# Patient Record
Sex: Male | Born: 1955 | Race: White | Hispanic: No | Marital: Married | Smoking: Current every day smoker
Health system: Southern US, Community
[De-identification: ages and names within clinical notes are randomized; demographics above are authoritative.]

## PROBLEM LIST (undated history)

## (undated) DIAGNOSIS — D72829 Elevated white blood cell count, unspecified: Secondary | ICD-10-CM

## (undated) DIAGNOSIS — U071 COVID-19: Secondary | ICD-10-CM

## (undated) DIAGNOSIS — Z9981 Dependence on supplemental oxygen: Secondary | ICD-10-CM

## (undated) DIAGNOSIS — E785 Hyperlipidemia, unspecified: Secondary | ICD-10-CM

## (undated) DIAGNOSIS — I10 Essential (primary) hypertension: Secondary | ICD-10-CM

## (undated) DIAGNOSIS — I219 Acute myocardial infarction, unspecified: Secondary | ICD-10-CM

## (undated) DIAGNOSIS — J449 Chronic obstructive pulmonary disease, unspecified: Secondary | ICD-10-CM

## (undated) DIAGNOSIS — J189 Pneumonia, unspecified organism: Secondary | ICD-10-CM

## (undated) DIAGNOSIS — R0600 Dyspnea, unspecified: Secondary | ICD-10-CM

## (undated) DIAGNOSIS — M069 Rheumatoid arthritis, unspecified: Secondary | ICD-10-CM

## (undated) HISTORY — DX: Acute myocardial infarction, unspecified: I21.9

## (undated) HISTORY — PX: TOOTH EXTRACTION: SUR596

## (undated) HISTORY — PX: FOOT SURGERY: SHX648

## (undated) HISTORY — PX: MULTIPLE TOOTH EXTRACTIONS: SHX2053

## (undated) HISTORY — PX: HERNIA REPAIR: SHX51

## (undated) HISTORY — PX: SPLENECTOMY: SUR1306

---

## 1997-12-14 ENCOUNTER — Ambulatory Visit (HOSPITAL_COMMUNITY): Admission: RE | Admit: 1997-12-14 | Discharge: 1997-12-14 | Payer: Self-pay

## 2000-08-15 ENCOUNTER — Encounter (HOSPITAL_COMMUNITY): Admission: RE | Admit: 2000-08-15 | Discharge: 2000-10-03 | Payer: Self-pay | Admitting: Dentistry

## 2000-08-16 ENCOUNTER — Encounter (HOSPITAL_COMMUNITY): Payer: Self-pay | Admitting: Dentistry

## 2000-08-22 ENCOUNTER — Encounter (HOSPITAL_COMMUNITY): Payer: Self-pay | Admitting: Dentistry

## 2000-08-23 ENCOUNTER — Ambulatory Visit (HOSPITAL_COMMUNITY): Admission: RE | Admit: 2000-08-23 | Discharge: 2000-08-23 | Payer: Self-pay | Admitting: Dentistry

## 2000-09-07 ENCOUNTER — Encounter: Payer: Self-pay | Admitting: Hematology & Oncology

## 2000-09-07 ENCOUNTER — Ambulatory Visit (HOSPITAL_COMMUNITY): Admission: RE | Admit: 2000-09-07 | Discharge: 2000-09-07 | Payer: Self-pay | Admitting: Hematology & Oncology

## 2000-09-07 ENCOUNTER — Inpatient Hospital Stay (HOSPITAL_COMMUNITY): Admission: EM | Admit: 2000-09-07 | Discharge: 2000-09-12 | Payer: Self-pay | Admitting: Emergency Medicine

## 2000-09-09 ENCOUNTER — Encounter: Payer: Self-pay | Admitting: Oncology

## 2000-09-10 ENCOUNTER — Encounter (HOSPITAL_COMMUNITY): Payer: Self-pay | Admitting: Oncology

## 2000-10-04 ENCOUNTER — Encounter (HOSPITAL_COMMUNITY): Admission: RE | Admit: 2000-10-04 | Discharge: 2001-01-02 | Payer: Self-pay | Admitting: Dentistry

## 2000-10-25 ENCOUNTER — Ambulatory Visit (HOSPITAL_COMMUNITY): Admission: RE | Admit: 2000-10-25 | Discharge: 2000-10-25 | Payer: Self-pay | Admitting: Oncology

## 2000-10-25 ENCOUNTER — Encounter: Payer: Self-pay | Admitting: Oncology

## 2000-12-23 ENCOUNTER — Emergency Department (HOSPITAL_COMMUNITY): Admission: EM | Admit: 2000-12-23 | Discharge: 2000-12-23 | Payer: Self-pay

## 2000-12-23 ENCOUNTER — Encounter: Payer: Self-pay | Admitting: Emergency Medicine

## 2001-10-30 ENCOUNTER — Encounter: Admission: RE | Admit: 2001-10-30 | Discharge: 2001-10-30 | Payer: Self-pay | Admitting: Infectious Diseases

## 2003-05-21 ENCOUNTER — Inpatient Hospital Stay (HOSPITAL_COMMUNITY): Admission: EM | Admit: 2003-05-21 | Discharge: 2003-05-29 | Payer: Self-pay | Admitting: Oncology

## 2003-05-26 ENCOUNTER — Encounter (INDEPENDENT_AMBULATORY_CARE_PROVIDER_SITE_OTHER): Payer: Self-pay | Admitting: *Deleted

## 2003-05-28 IMAGING — US US ABDOMEN COMPLETE
1 series · 14 of 25 positions shown · non-contrast
Comparison: none

CLINICAL DATA: Rheumatoid arthritis and Felty?s syndrome.  Left-sided abdominal pain.  
 ABDOMINAL ULTRASOUND 
 There is no evidence of gallstones or gallbladder wall thickening.  There is no evidence of biliary ductal dilation with common bile duct measuring approximately 5 mm.  The liver is normal in echogenicity.  No focal liver lesions are seen.  Visualized portion of the IVC is unremarkable.  Visualization of the pancreas was very limited due to overlying bowel gas.  Splenomegaly is noted with the spleen measuring approximately 20 cm in length.  No splenic lesions are identified.  Kidneys are normal in size and appearance.  There is no evidence of hydronephrosis.  Evaluation of the abdominal aorta was also limited by overlying bowel gas.  The visualized portion of the distal aorta is nondilated.  
 IMPRESSION
 1.  Splenomegaly, with splenic length measuring approximately 20 cm. 
 2.  No evidence of gallstones or biliary ductal dilation.

[Series 1: unknown · 0.37mm/px · 14 of 75 slices shown]
[im 1/75]
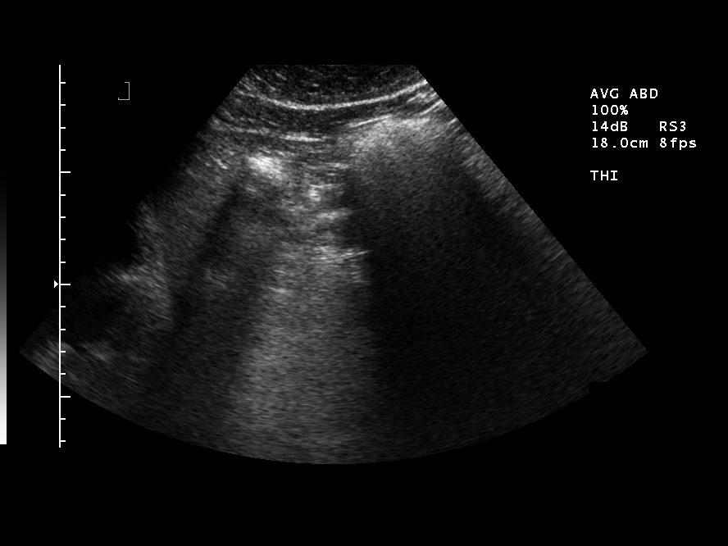
[im 7/75]
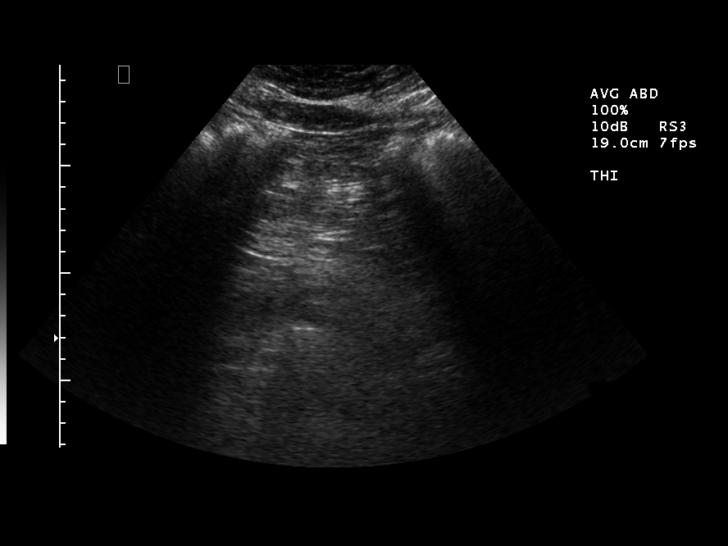
[im 13/75]
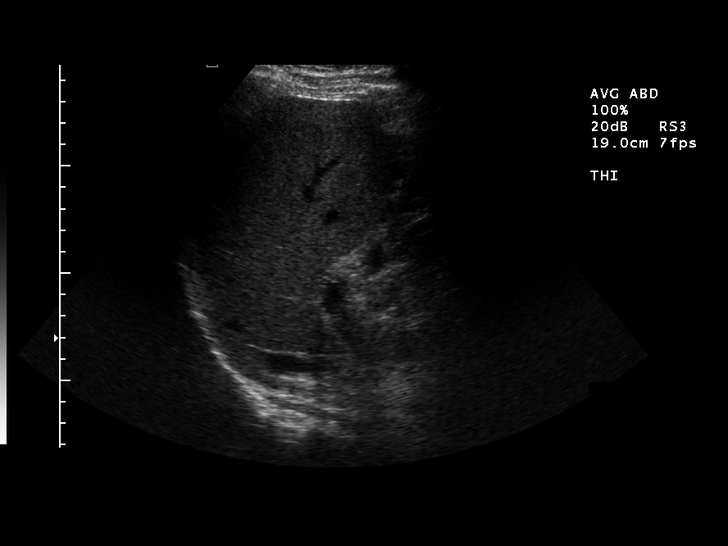
[im 19/75]
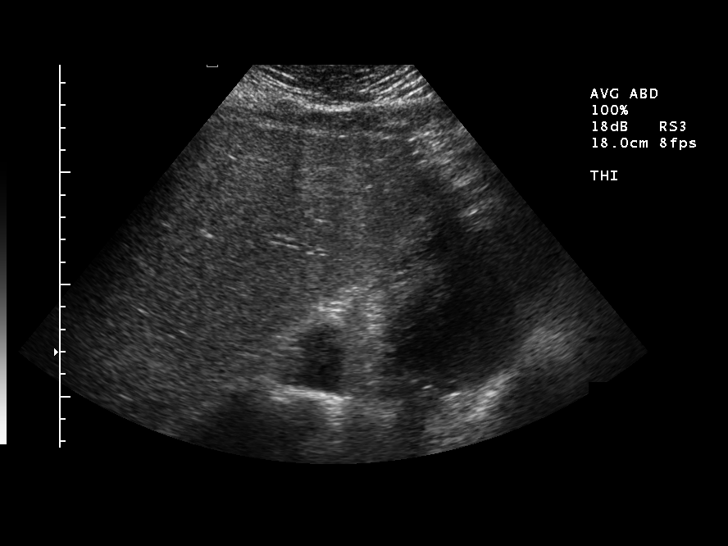
[im 25/75]
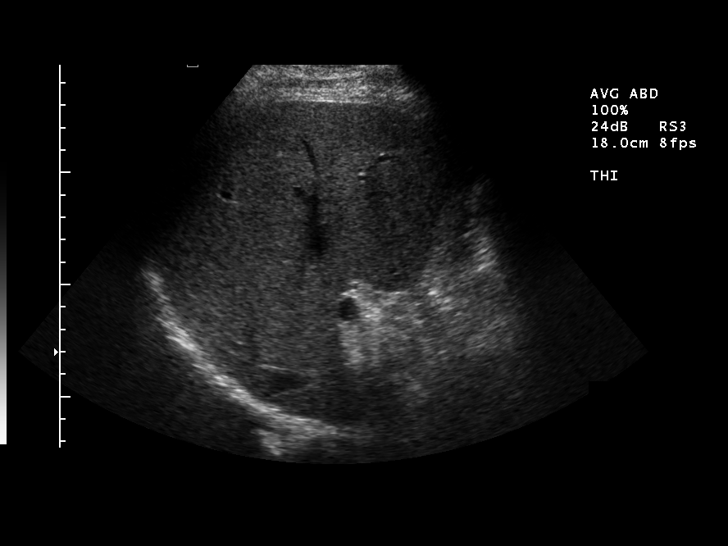
[im 28/75]
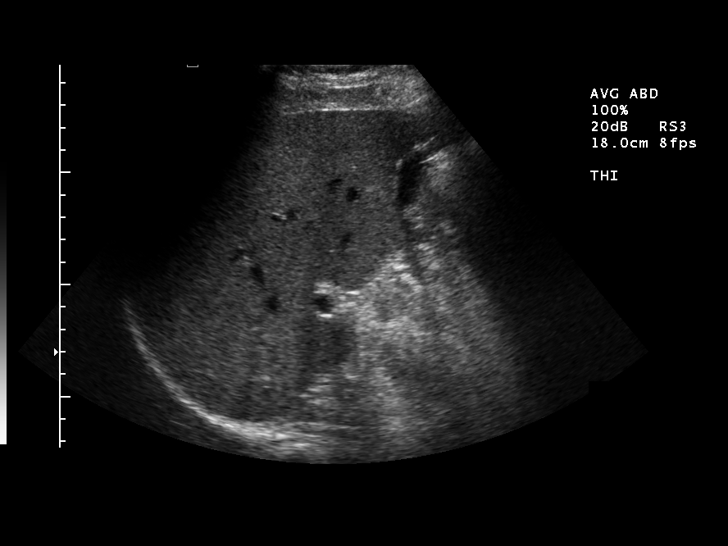
[im 34/75]
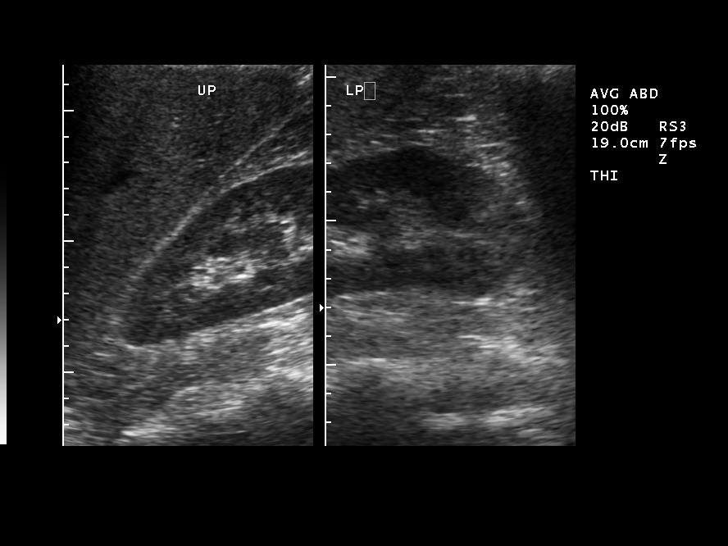
[im 41/75]
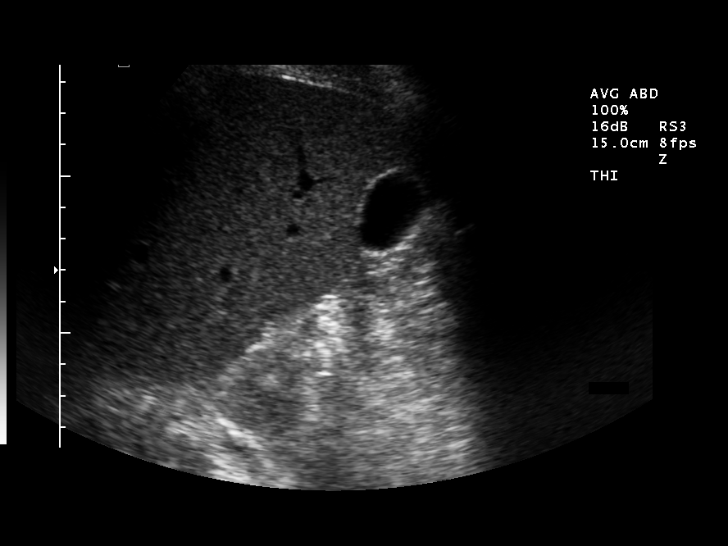
[im 47/75]
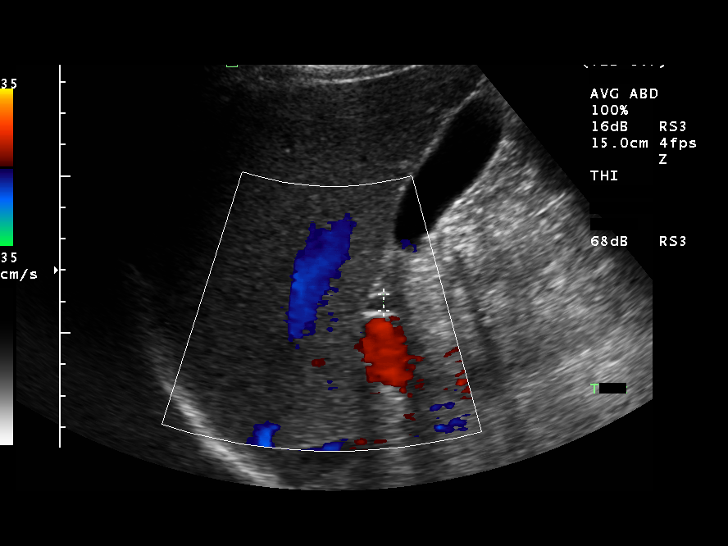
[im 50/75]
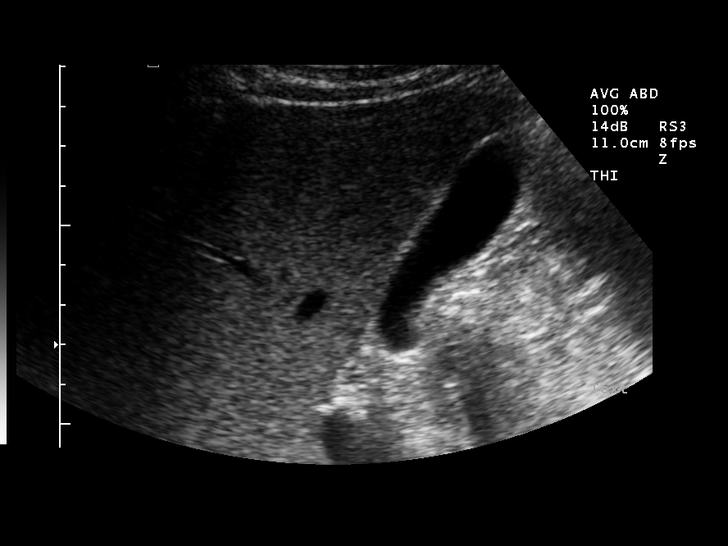
[im 56/75]
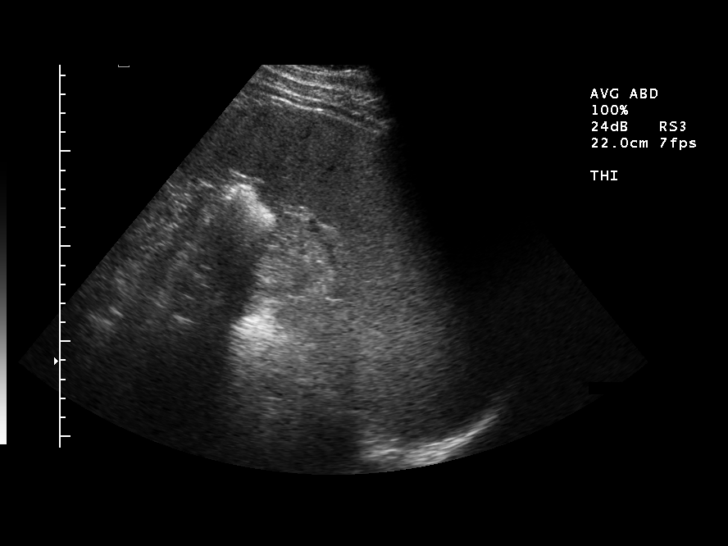
[im 62/75]
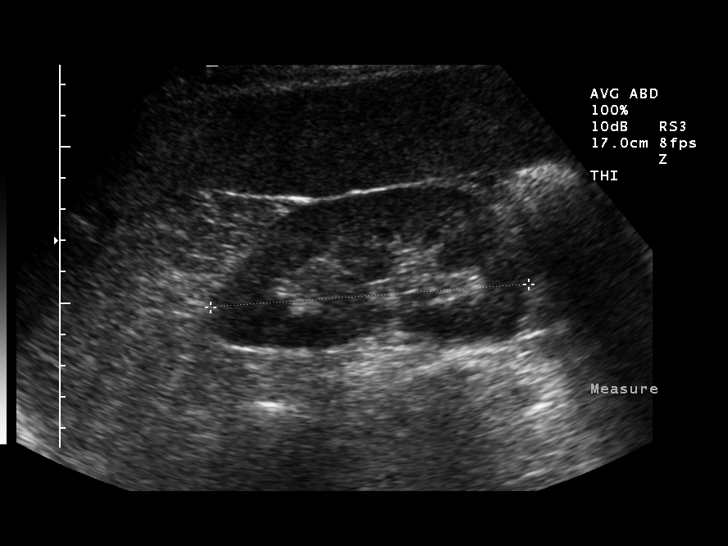
[im 68/75]
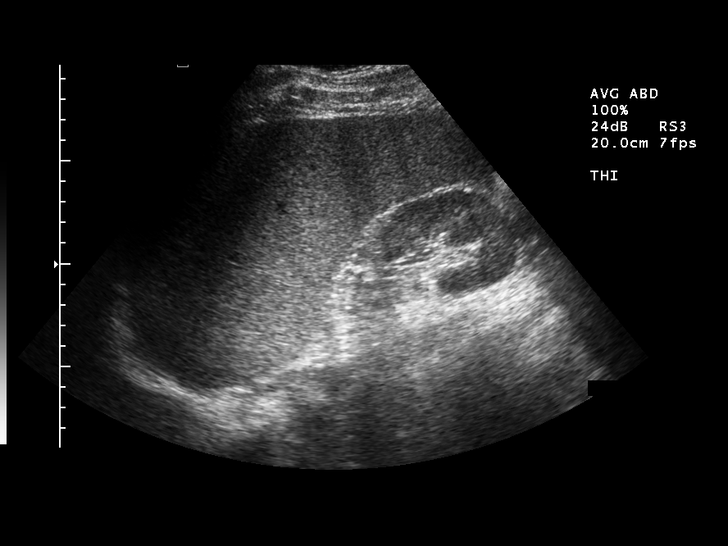
[im 75/75]
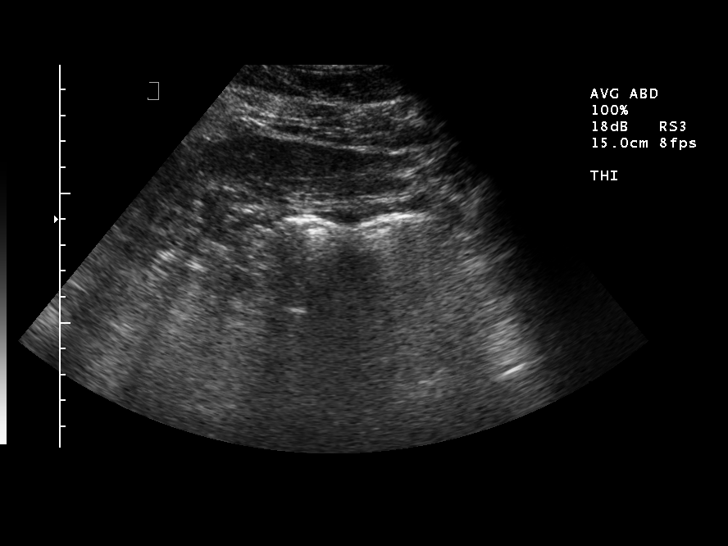

[14 of 25 positions shown; findings below may reference images not displayed]

## 2003-06-29 IMAGING — CR DG CHEST 2V
2 series · 2 of 2 positions shown · non-contrast
Comparison: [DATE].

CLINICAL DATA: Neutropenia.
 CHEST, TWO VIEWS [DATE]

[view not recorded (1 of 2)]
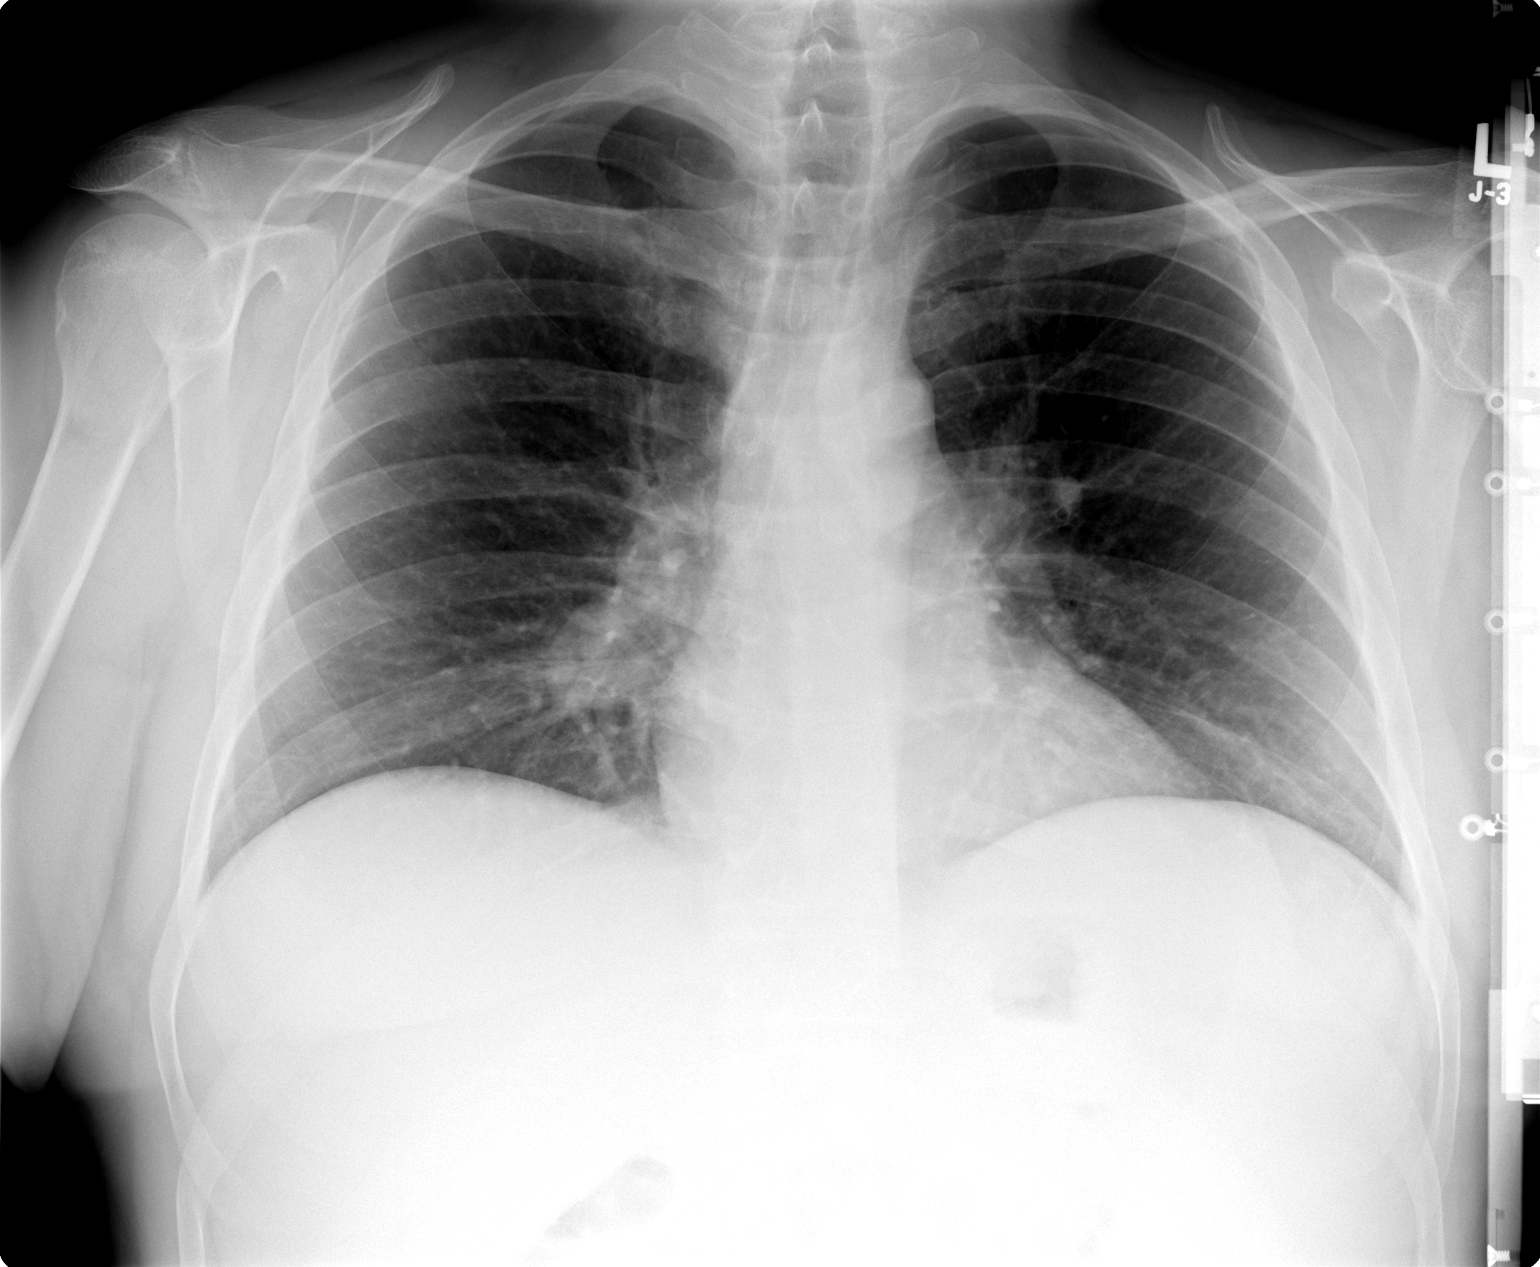

[view not recorded (2 of 2)]
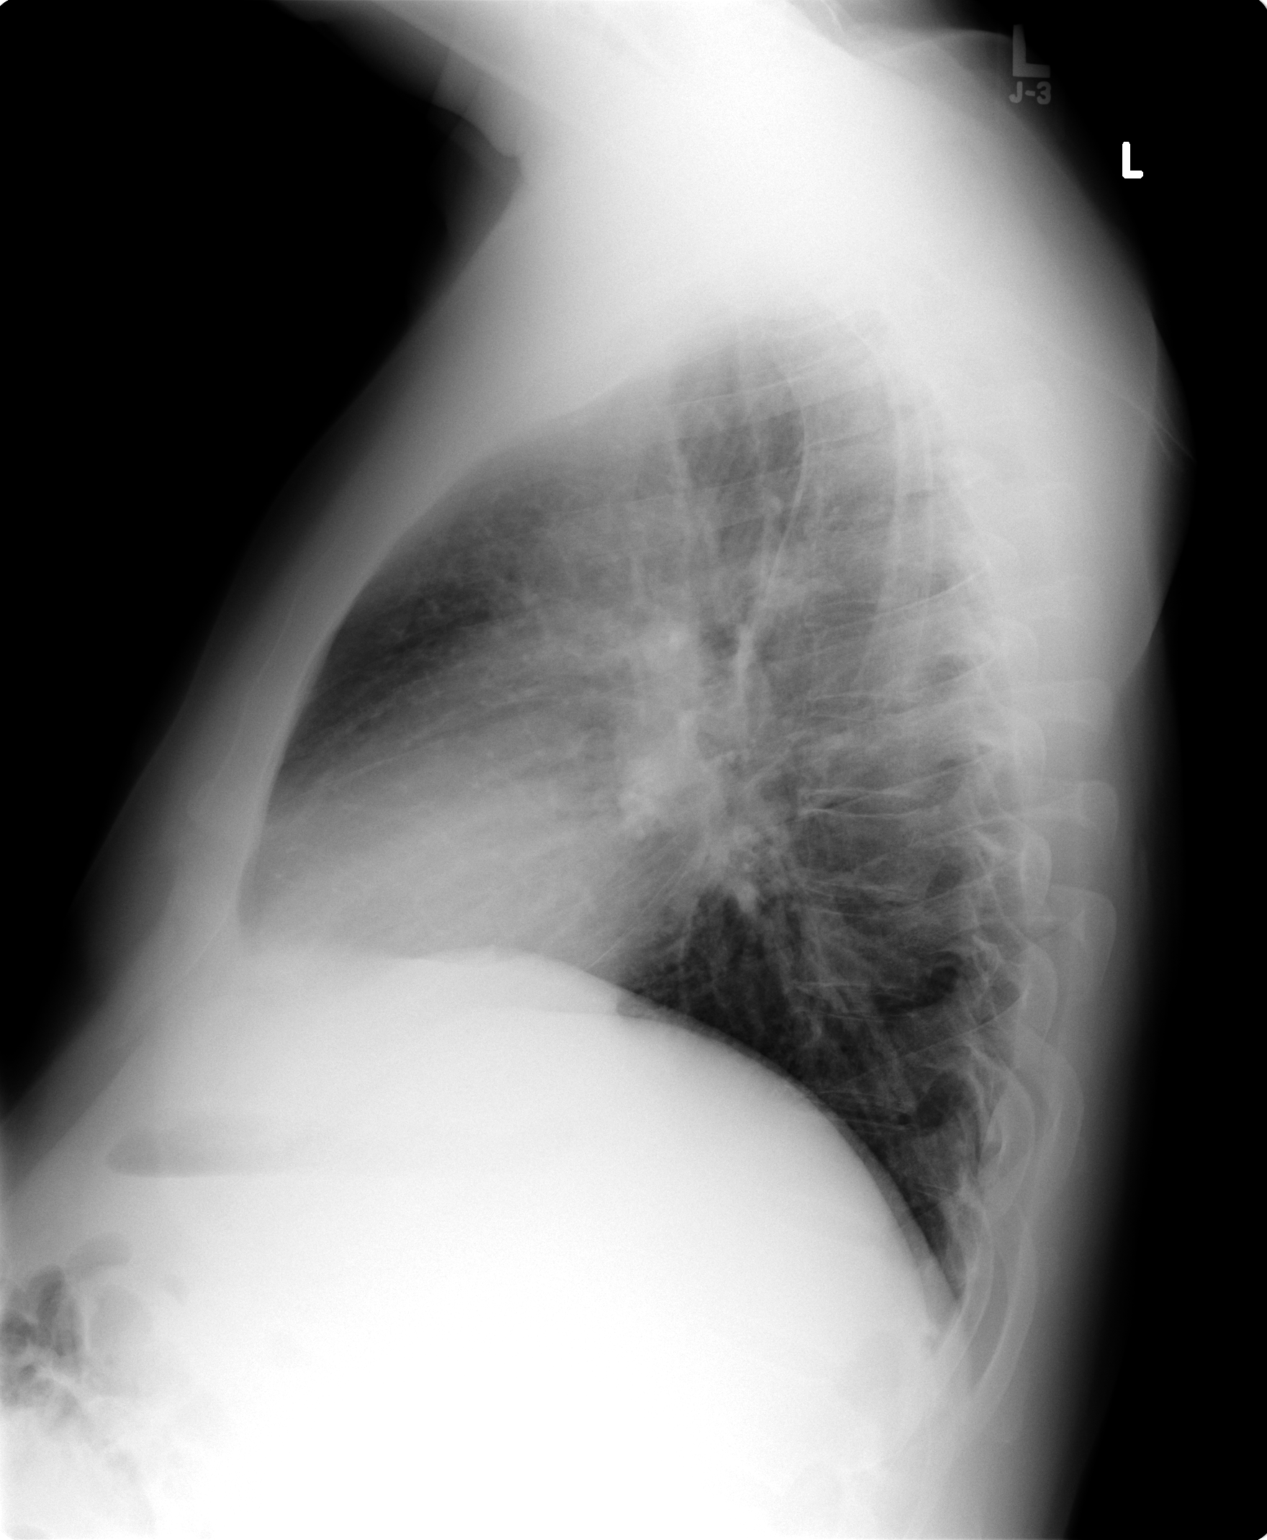

[2 of 2 positions shown; findings below may reference images not displayed]

The lungs are clear, and the heart and mediastinal structures are normal. 
 IMPRESSION
 No evidence for active chest disease.

## 2003-07-03 ENCOUNTER — Inpatient Hospital Stay (HOSPITAL_COMMUNITY): Admission: RE | Admit: 2003-07-03 | Discharge: 2003-07-06 | Payer: Self-pay | Admitting: General Surgery

## 2003-07-03 ENCOUNTER — Encounter (INDEPENDENT_AMBULATORY_CARE_PROVIDER_SITE_OTHER): Payer: Self-pay | Admitting: *Deleted

## 2003-07-05 IMAGING — CR DG CHEST 2V
2 series · 2 of 2 positions shown · non-contrast
Comparison: none

CLINICAL DATA: Felty?s syndrome, neutropenia.  
 TWO VIEW CHEST RADIOGRAPH, [DATE]
 Comparing [DATE].
 The heart size and mediastinal contours are normal. The lungs are clear. The visualized skeleton is unremarkable.

 IMPRESSION
 No active disease.

[view not recorded (1 of 2)]
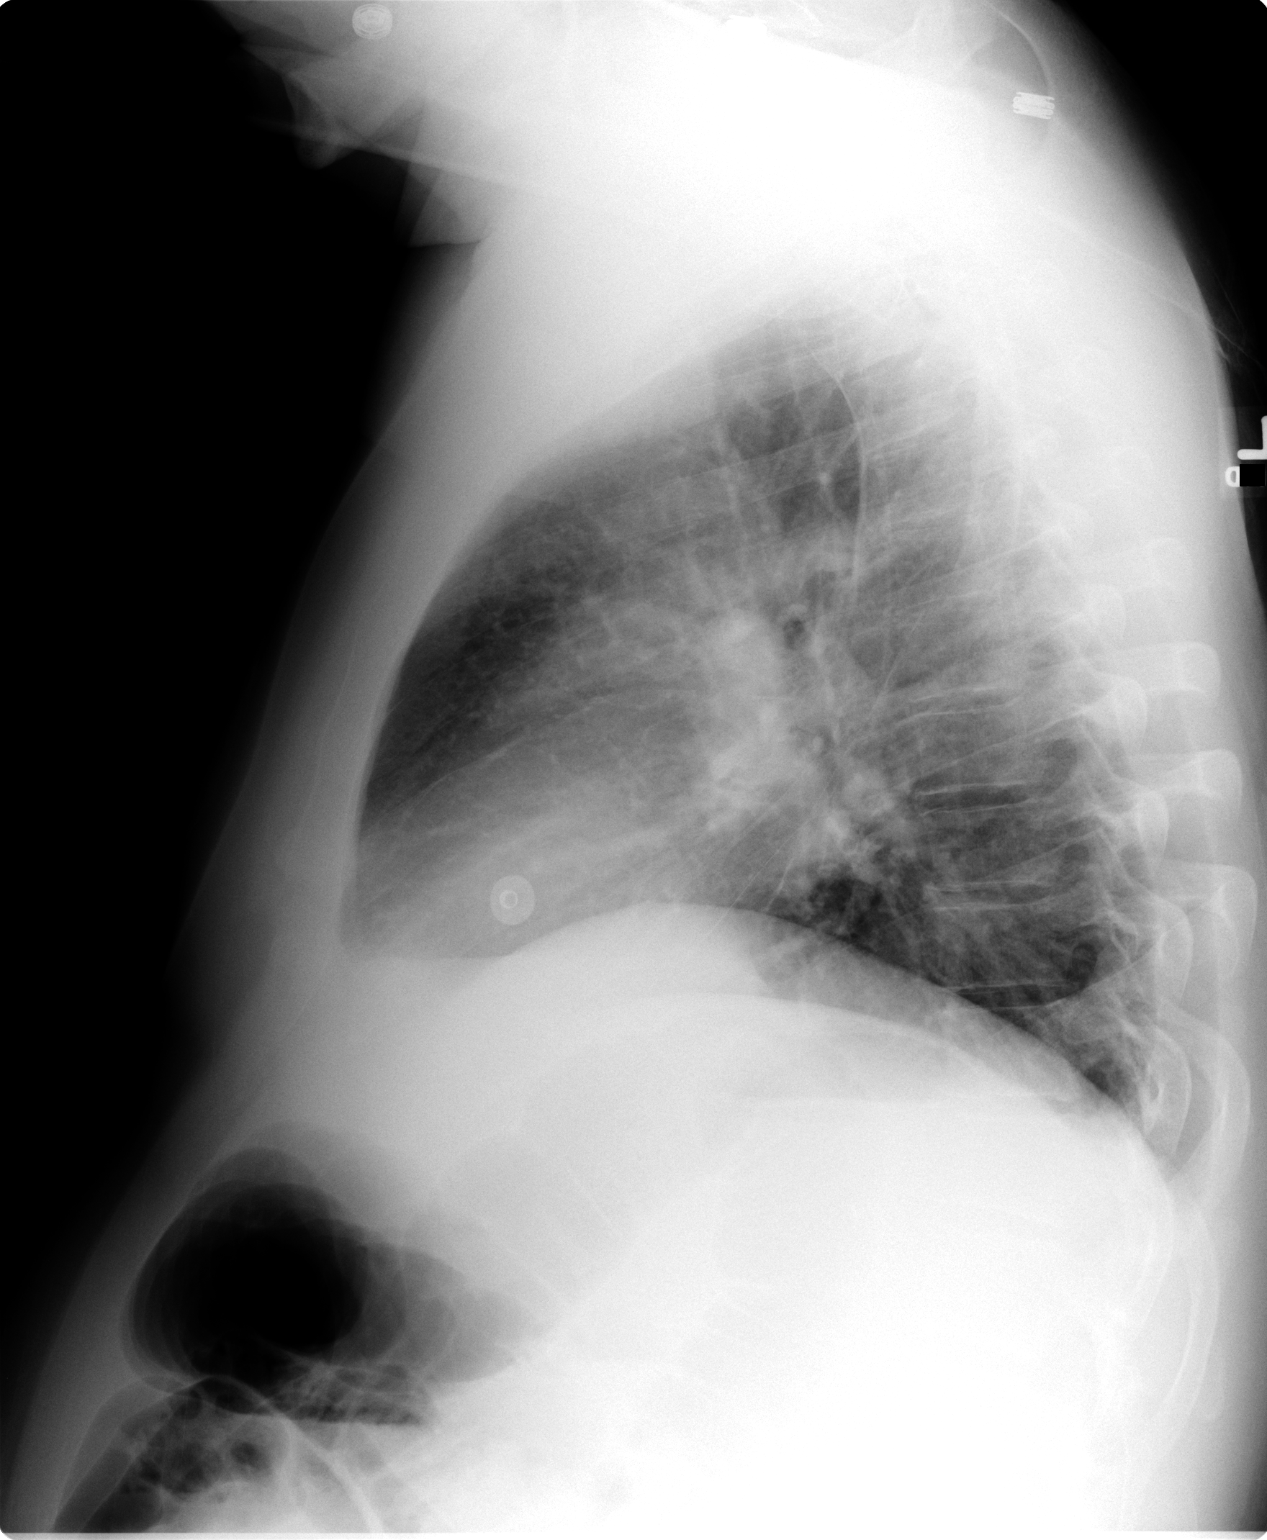

[view not recorded (2 of 2)]
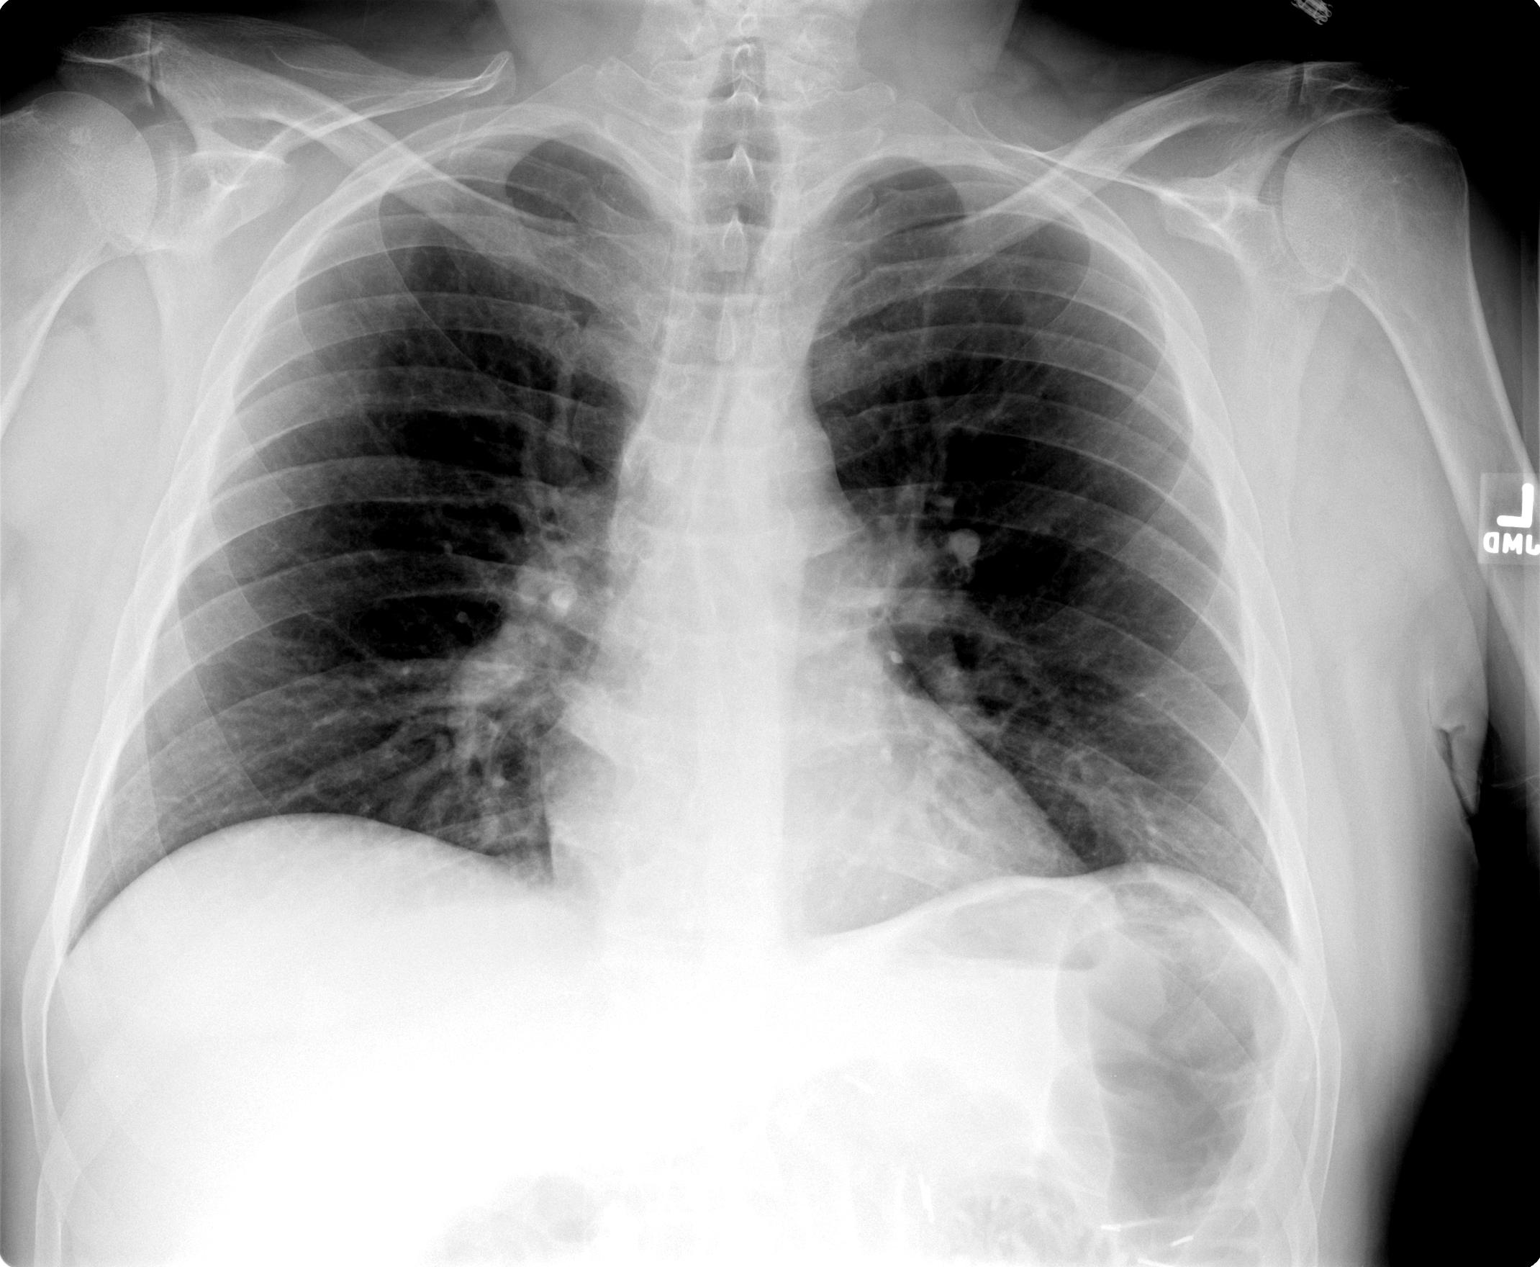

[2 of 2 positions shown; findings below may reference images not displayed]

## 2003-11-26 ENCOUNTER — Ambulatory Visit (HOSPITAL_COMMUNITY): Admission: RE | Admit: 2003-11-26 | Discharge: 2003-11-26 | Payer: Self-pay | Admitting: Oncology

## 2003-11-26 IMAGING — CR DG CHEST 2V
2 series · 2 of 2 positions shown · non-contrast
Comparison: [DATE].

CLINICAL DATA: Felty?s syndrome/neutropenia.
 CHEST, TWO VIEWS [DATE]

[view not recorded (1 of 2)]
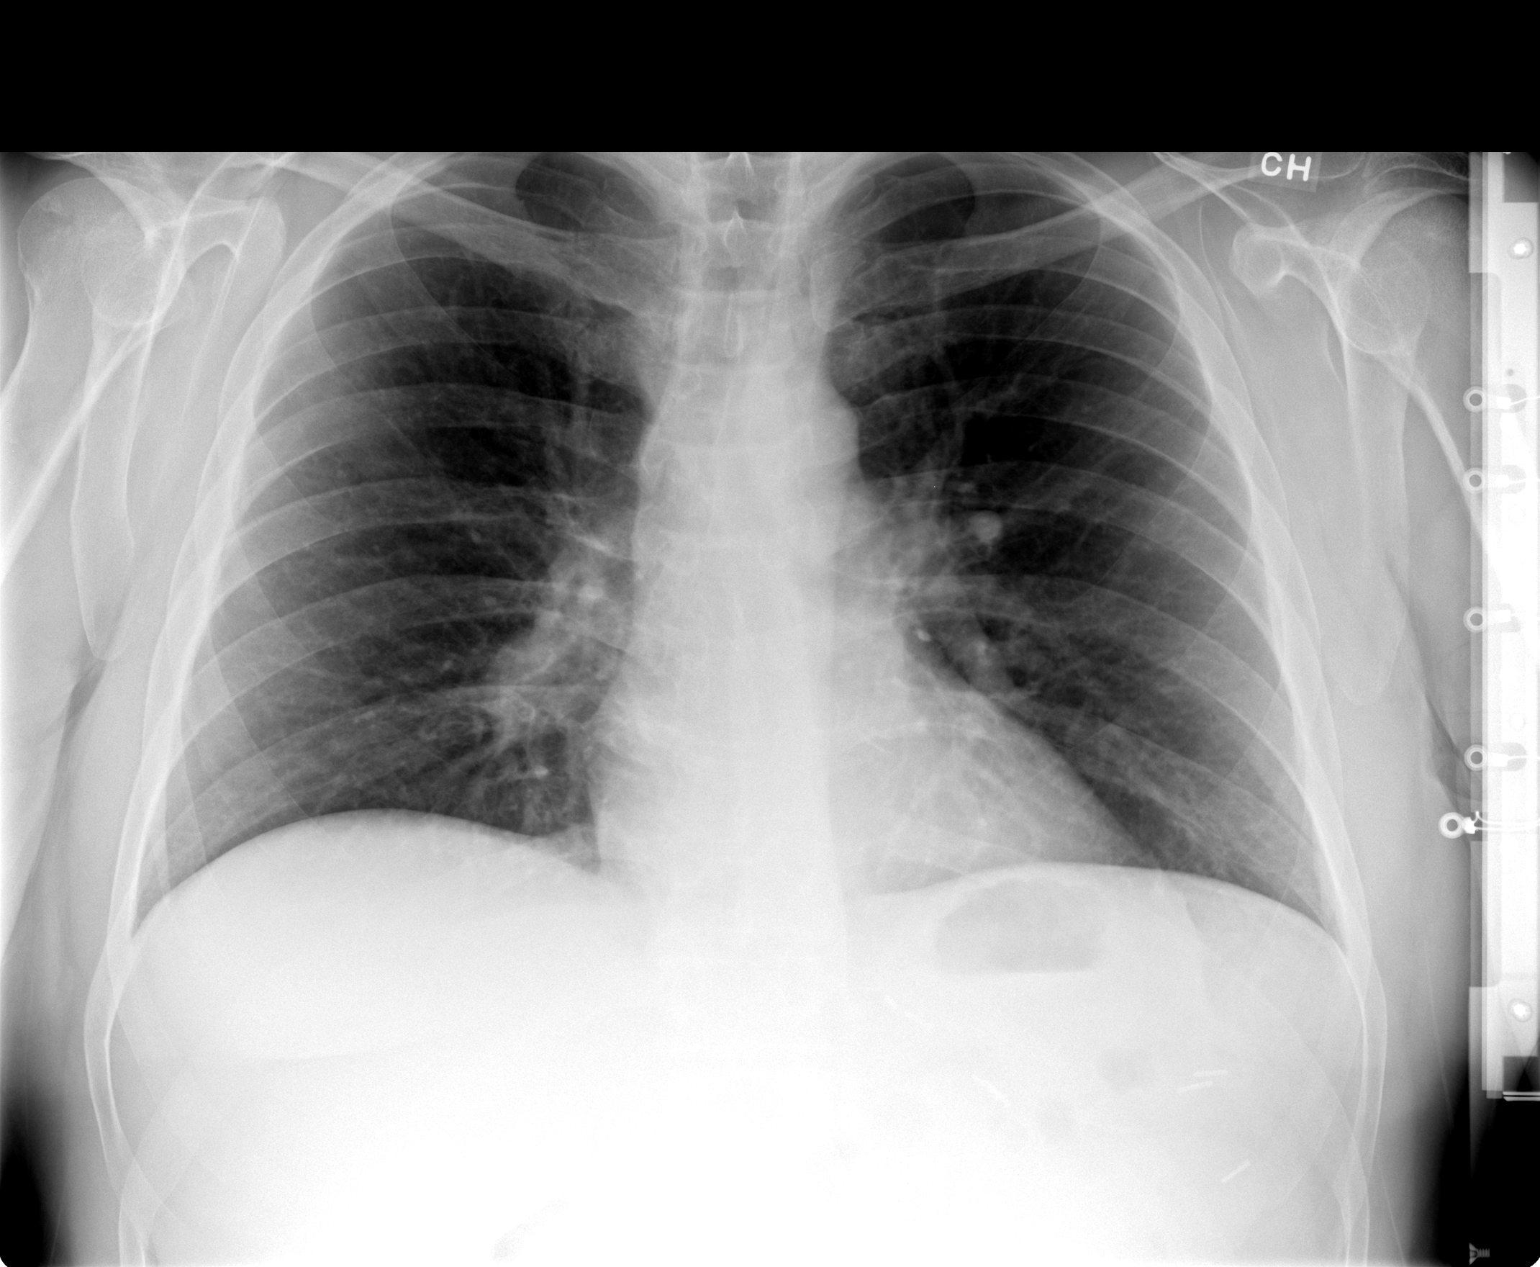

[view not recorded (2 of 2)]
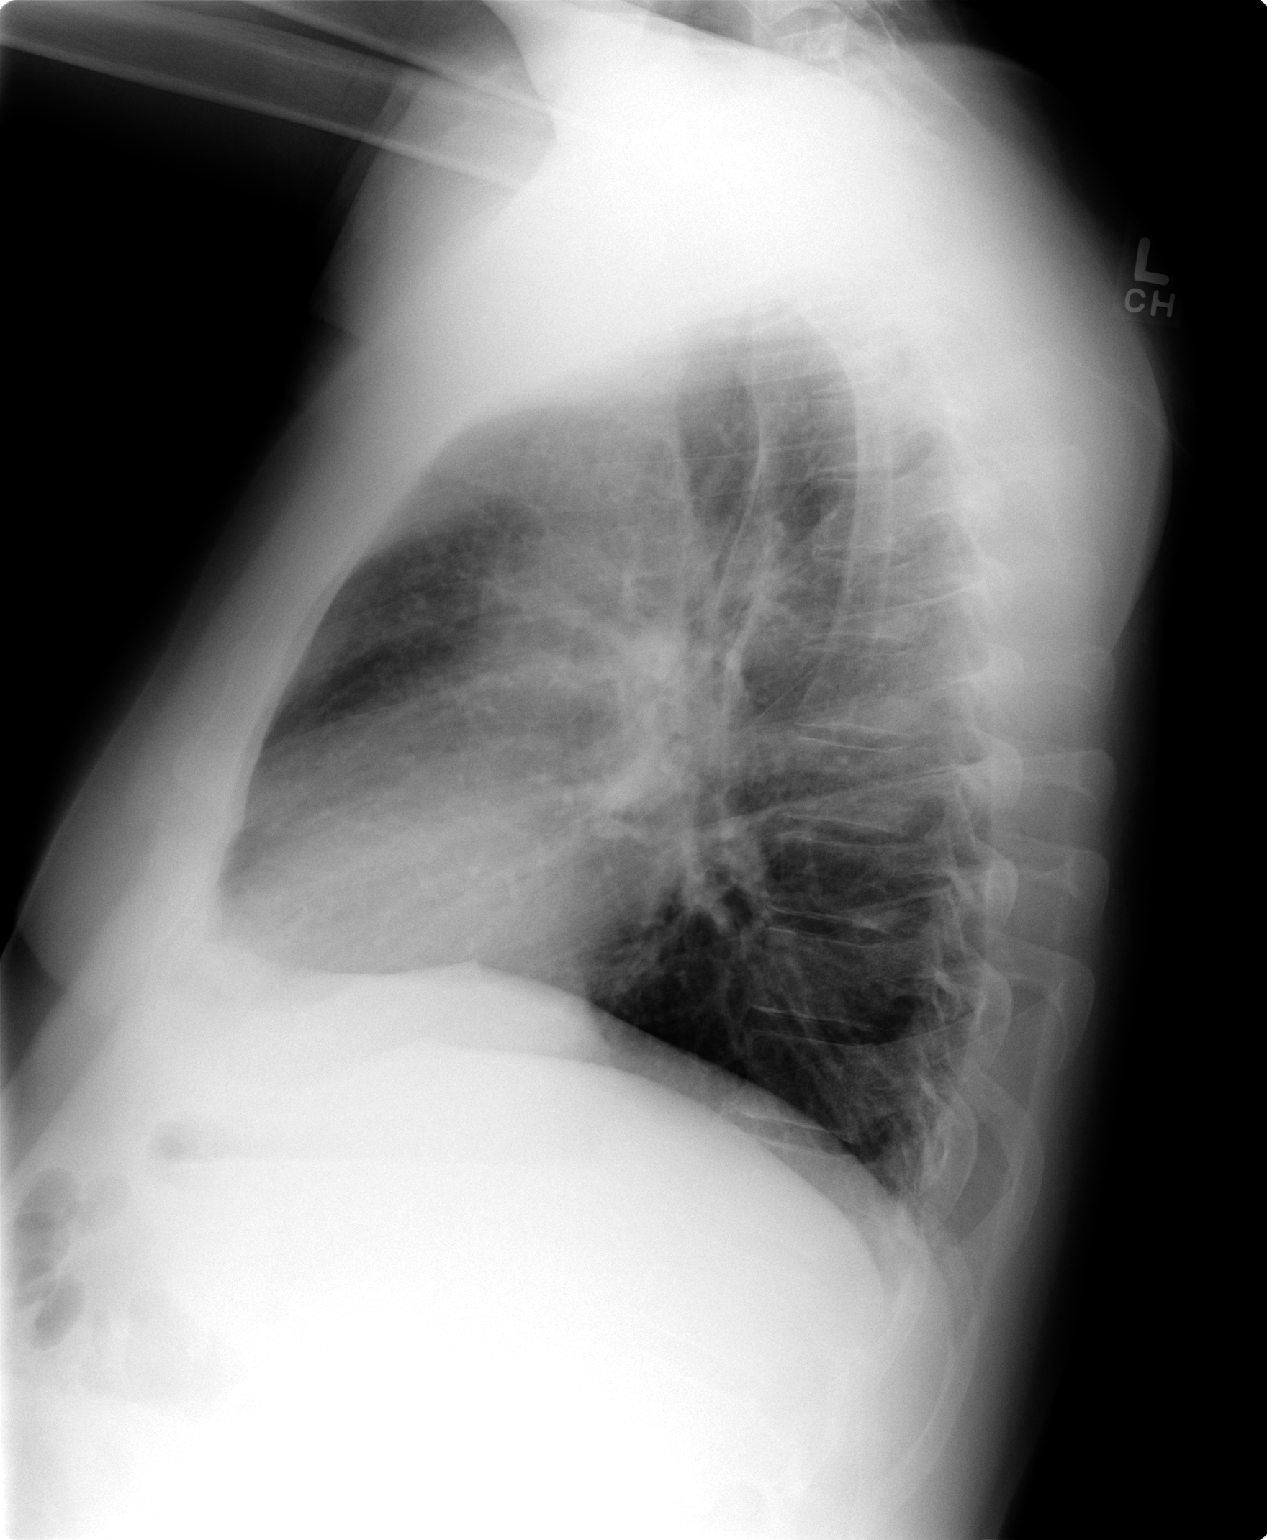

[2 of 2 positions shown; findings below may reference images not displayed]

Heart and lungs remain normal.  Lungs clear.  Osseous structures intact. 
 IMPRESSION
 No active cardiopulmonary disease.

## 2004-02-25 ENCOUNTER — Ambulatory Visit: Payer: Self-pay | Admitting: Oncology

## 2004-05-18 ENCOUNTER — Ambulatory Visit: Payer: Self-pay | Admitting: Oncology

## 2004-11-16 ENCOUNTER — Ambulatory Visit: Payer: Self-pay | Admitting: Oncology

## 2005-02-15 ENCOUNTER — Ambulatory Visit: Payer: Self-pay | Admitting: Oncology

## 2005-05-02 ENCOUNTER — Ambulatory Visit (HOSPITAL_BASED_OUTPATIENT_CLINIC_OR_DEPARTMENT_OTHER): Admission: RE | Admit: 2005-05-02 | Discharge: 2005-05-02 | Payer: Self-pay | Admitting: General Surgery

## 2005-06-28 ENCOUNTER — Ambulatory Visit: Payer: Self-pay | Admitting: Oncology

## 2005-06-29 LAB — CBC WITH DIFFERENTIAL/PLATELET
Eosinophils Absolute: 0.1 10*3/uL (ref 0.0–0.5)
HCT: 41.7 % (ref 38.7–49.9)
LYMPH%: 58.7 % — ABNORMAL HIGH (ref 14.0–48.0)
MCV: 88.8 fL (ref 81.6–98.0)
MONO#: 0.6 10*3/uL (ref 0.1–0.9)
MONO%: 19.2 % — ABNORMAL HIGH (ref 0.0–13.0)
NEUT#: 0.5 10*3/uL — ABNORMAL LOW (ref 1.5–6.5)
NEUT%: 15.8 % — ABNORMAL LOW (ref 40.0–75.0)
Platelets: 196 10*3/uL (ref 145–400)
WBC: 3.2 10*3/uL — ABNORMAL LOW (ref 4.0–10.0)

## 2005-06-29 LAB — COMPREHENSIVE METABOLIC PANEL
BUN: 10 mg/dL (ref 6–23)
CO2: 24 mEq/L (ref 19–32)
Creatinine, Ser: 0.9 mg/dL (ref 0.4–1.5)
Glucose, Bld: 88 mg/dL (ref 70–99)
Total Bilirubin: 0.8 mg/dL (ref 0.3–1.2)

## 2005-07-27 LAB — CBC WITH DIFFERENTIAL/PLATELET
Basophils Absolute: 0.1 10*3/uL (ref 0.0–0.1)
Eosinophils Absolute: 0.1 10*3/uL (ref 0.0–0.5)
HGB: 13.5 g/dL (ref 13.0–17.1)
LYMPH%: 51.1 % — ABNORMAL HIGH (ref 14.0–48.0)
MONO#: 1 10*3/uL — ABNORMAL HIGH (ref 0.1–0.9)
NEUT#: 0.8 10*3/uL — ABNORMAL LOW (ref 1.5–6.5)
Platelets: 217 10*3/uL (ref 145–400)
RBC: 4.52 10*6/uL (ref 4.20–5.71)
RDW: 14.8 % — ABNORMAL HIGH (ref 11.2–14.6)
WBC: 4 10*3/uL (ref 4.0–10.0)

## 2005-10-20 ENCOUNTER — Ambulatory Visit: Payer: Self-pay | Admitting: Oncology

## 2005-10-26 LAB — CBC WITH DIFFERENTIAL/PLATELET
Basophils Absolute: 0 10*3/uL (ref 0.0–0.1)
Eosinophils Absolute: 0.2 10*3/uL (ref 0.0–0.5)
HGB: 14.1 g/dL (ref 13.0–17.1)
MCV: 88.8 fL (ref 81.6–98.0)
MONO#: 1.1 10*3/uL — ABNORMAL HIGH (ref 0.1–0.9)
MONO%: 31.9 % — ABNORMAL HIGH (ref 0.0–13.0)
NEUT#: 0.4 10*3/uL — CL (ref 1.5–6.5)
RBC: 4.68 10*6/uL (ref 4.20–5.71)
RDW: 16.2 % — ABNORMAL HIGH (ref 11.2–14.6)
WBC: 3.3 10*3/uL — ABNORMAL LOW (ref 4.0–10.0)
lymph#: 1.7 10*3/uL (ref 0.9–3.3)

## 2006-02-19 ENCOUNTER — Ambulatory Visit: Payer: Self-pay | Admitting: Oncology

## 2006-02-22 LAB — CBC WITH DIFFERENTIAL/PLATELET
BASO%: 0.5 % (ref 0.0–2.0)
EOS%: 4 % (ref 0.0–7.0)
HGB: 14 g/dL (ref 13.0–17.1)
HGB: 14 g/dL (ref 13.0–17.1)
MCH: 30.5 pg (ref 28.0–33.4)
MCH: 30.5 pg (ref 28.0–33.4)
MCHC: 33.8 g/dL (ref 32.0–35.9)
MCV: 90.2 fL (ref 81.6–98.0)
MCV: 90.2 fL (ref 81.6–98.0)
MONO%: 29.1 % — ABNORMAL HIGH (ref 0.0–13.0)
RBC: 4.59 10*6/uL (ref 4.20–5.71)
RBC: 4.59 10*6/uL (ref 4.20–5.71)
RDW: 16.5 % — ABNORMAL HIGH (ref 11.2–14.6)
RDW: 16.5 % — ABNORMAL HIGH (ref 11.2–14.6)
lymph#: 1.7 10*3/uL (ref 0.9–3.3)

## 2006-02-22 LAB — TECHNOLOGIST REVIEW

## 2006-08-20 ENCOUNTER — Ambulatory Visit: Payer: Self-pay | Admitting: Oncology

## 2006-08-23 LAB — CBC WITH DIFFERENTIAL/PLATELET
Basophils Absolute: 0.1 10*3/uL (ref 0.0–0.1)
Eosinophils Absolute: 0.1 10*3/uL (ref 0.0–0.5)
HCT: 42.3 % (ref 38.7–49.9)
HGB: 14.5 g/dL (ref 13.0–17.1)
LYMPH%: 53 % — ABNORMAL HIGH (ref 14.0–48.0)
MONO#: 0.7 10*3/uL (ref 0.1–0.9)
NEUT#: 0.7 10*3/uL — ABNORMAL LOW (ref 1.5–6.5)
NEUT%: 19.7 % — ABNORMAL LOW (ref 40.0–75.0)
Platelets: 195 10*3/uL (ref 145–400)
RBC: 4.69 10*6/uL (ref 4.20–5.71)
WBC: 3.5 10*3/uL — ABNORMAL LOW (ref 4.0–10.0)

## 2006-08-23 LAB — TECHNOLOGIST REVIEW

## 2006-10-16 ENCOUNTER — Ambulatory Visit: Payer: Self-pay | Admitting: Oncology

## 2006-12-17 ENCOUNTER — Ambulatory Visit: Payer: Self-pay | Admitting: Oncology

## 2006-12-20 LAB — MANUAL DIFFERENTIAL
ALC: 1.6 10*3/uL (ref 0.9–3.3)
ANC (CHCC manual diff): 0.5 10*3/uL — ABNORMAL LOW (ref 1.5–6.5)
Band Neutrophils: 0 % (ref 0–10)
Basophil: 1 % (ref 0–2)
Blasts: 0 % (ref 0–0)
LYMPH: 42 % (ref 14–49)
Other Cell: 0 % (ref 0–0)
Variant Lymph: 3 % — ABNORMAL HIGH (ref 0–0)
nRBC: 0 % (ref 0–0)

## 2006-12-20 LAB — CBC WITH DIFFERENTIAL/PLATELET
HCT: 40.8 % (ref 38.7–49.9)
MCHC: 34.8 g/dL (ref 32.0–35.9)
Platelets: 156 10*3/uL (ref 145–400)

## 2007-04-18 ENCOUNTER — Ambulatory Visit: Payer: Self-pay | Admitting: Oncology

## 2007-04-22 LAB — MANUAL DIFFERENTIAL
ALC: 2.1 10*3/uL (ref 0.9–3.3)
Band Neutrophils: 1 % (ref 0–10)
Basophil: 2 % (ref 0–2)
Blasts: 0 % (ref 0–0)
LYMPH: 44 % (ref 14–49)
MONO: 23 % — ABNORMAL HIGH (ref 0–14)
Metamyelocytes: 5 % — ABNORMAL HIGH (ref 0–0)
Variant Lymph: 0 % (ref 0–0)
nRBC: 0 % (ref 0–0)

## 2007-04-22 LAB — CBC WITH DIFFERENTIAL/PLATELET
HCT: 36.6 % — ABNORMAL LOW (ref 38.7–49.9)
HGB: 12.6 g/dL — ABNORMAL LOW (ref 13.0–17.1)
MCH: 31 pg (ref 28.0–33.4)
MCHC: 34.4 g/dL (ref 32.0–35.9)
Platelets: 304 10*3/uL (ref 145–400)

## 2007-08-15 ENCOUNTER — Ambulatory Visit: Payer: Self-pay | Admitting: Oncology

## 2007-08-20 LAB — CBC WITH DIFFERENTIAL/PLATELET
Basophils Absolute: 0 10*3/uL (ref 0.0–0.1)
EOS%: 6 % (ref 0.0–7.0)
HCT: 41.7 % (ref 38.7–49.9)
HGB: 14.3 g/dL (ref 13.0–17.1)
LYMPH%: 40.7 % (ref 14.0–48.0)
MCH: 30.8 pg (ref 28.0–33.4)
MCV: 89.9 fL (ref 81.6–98.0)
MONO%: 25.5 % — ABNORMAL HIGH (ref 0.0–13.0)
NEUT%: 26.9 % — ABNORMAL LOW (ref 40.0–75.0)
Platelets: 221 10*3/uL (ref 145–400)
RDW: 16.6 % — ABNORMAL HIGH (ref 11.2–14.6)

## 2007-08-20 LAB — COMPREHENSIVE METABOLIC PANEL
AST: 23 U/L (ref 0–37)
Alkaline Phosphatase: 83 U/L (ref 39–117)
BUN: 10 mg/dL (ref 6–23)
Creatinine, Ser: 0.8 mg/dL (ref 0.40–1.50)
Glucose, Bld: 105 mg/dL — ABNORMAL HIGH (ref 70–99)

## 2008-01-07 ENCOUNTER — Ambulatory Visit: Payer: Self-pay | Admitting: Oncology

## 2008-01-09 LAB — CBC WITH DIFFERENTIAL/PLATELET
Basophils Absolute: 0 10*3/uL (ref 0.0–0.1)
EOS%: 1.4 % (ref 0.0–7.0)
Eosinophils Absolute: 0.2 10*3/uL (ref 0.0–0.5)
HCT: 45 % (ref 38.7–49.9)
HGB: 14.9 g/dL (ref 13.0–17.1)
MCH: 29.9 pg (ref 28.0–33.4)
NEUT%: 70 % (ref 40.0–75.0)
lymph#: 2.8 10*3/uL (ref 0.9–3.3)

## 2008-07-07 ENCOUNTER — Ambulatory Visit: Payer: Self-pay | Admitting: Oncology

## 2008-07-09 LAB — MANUAL DIFFERENTIAL
ANC (CHCC manual diff): 5.5 10*3/uL (ref 1.5–6.5)
Band Neutrophils: 0 % (ref 0–10)
Basophil: 2 % (ref 0–2)
Blasts: 0 % (ref 0–0)
Other Cell: 0 % (ref 0–0)
PROMYELO: 0 % (ref 0–0)
SEG: 54 % (ref 38–77)
nRBC: 0 % (ref 0–0)

## 2008-07-09 LAB — CBC WITH DIFFERENTIAL/PLATELET
HCT: 47.4 % (ref 38.4–49.9)
HGB: 15.6 g/dL (ref 13.0–17.1)
MCV: 89.1 fL (ref 79.3–98.0)
MONO%: 9.5 % (ref 0.0–14.0)
Platelets: 290 10*3/uL (ref 140–400)
WBC: 10 10*3/uL (ref 4.0–10.3)

## 2009-03-08 ENCOUNTER — Ambulatory Visit: Payer: Self-pay | Admitting: Oncology

## 2009-03-11 LAB — CBC WITH DIFFERENTIAL/PLATELET
Eosinophils Absolute: 0.4 10*3/uL (ref 0.0–0.5)
MONO#: 1.7 10*3/uL — ABNORMAL HIGH (ref 0.1–0.9)
NEUT#: 3.9 10*3/uL (ref 1.5–6.5)
Platelets: 264 10*3/uL (ref 140–400)
RBC: 5.77 10*6/uL (ref 4.20–5.82)
RDW: 15.1 % — ABNORMAL HIGH (ref 11.0–14.6)
WBC: 11.1 10*3/uL — ABNORMAL HIGH (ref 4.0–10.3)

## 2010-03-09 ENCOUNTER — Ambulatory Visit: Payer: Self-pay | Admitting: Oncology

## 2010-03-10 LAB — CBC WITH DIFFERENTIAL/PLATELET
BASO%: 1 % (ref 0.0–2.0)
Basophils Absolute: 0.1 10*3/uL (ref 0.0–0.1)
EOS%: 2.9 % (ref 0.0–7.0)
Eosinophils Absolute: 0.3 10*3/uL (ref 0.0–0.5)
HCT: 50.9 % — ABNORMAL HIGH (ref 38.4–49.9)
HGB: 16.9 g/dL (ref 13.0–17.1)
LYMPH%: 44.3 % (ref 14.0–49.0)
MCH: 29.8 pg (ref 27.2–33.4)
MCHC: 33.2 g/dL (ref 32.0–36.0)
MCV: 89.8 fL (ref 79.3–98.0)
MONO#: 1.6 10*3/uL — ABNORMAL HIGH (ref 0.1–0.9)
MONO%: 14.2 % — ABNORMAL HIGH (ref 0.0–14.0)
NEUT#: 4.3 10*3/uL (ref 1.5–6.5)
NEUT%: 37.6 % — ABNORMAL LOW (ref 39.0–75.0)
Platelets: 260 10*3/uL (ref 140–400)
RBC: 5.67 10*6/uL (ref 4.20–5.82)
RDW: 14.7 % — ABNORMAL HIGH (ref 11.0–14.6)
WBC: 11.5 10*3/uL — ABNORMAL HIGH (ref 4.0–10.3)
lymph#: 5.1 10*3/uL — ABNORMAL HIGH (ref 0.9–3.3)
nRBC: 0 % (ref 0–0)

## 2010-07-22 NOTE — Consult Note (Signed)
NAME:  Nathan Nicholson, Nathan Nicholson                        ACCOUNT NO.:  [id number]   MEDICAL RECORD NO.:  [id number]                   PATIENT TYPE:  INP   LOCATION:  0253                                 FACILITY:  [facility]   PHYSICIAN:  Sharlet Salina T. Hoxworth, M.D.          DATE OF BIRTH:  1955-05-23   DATE OF CONSULTATION:  05/27/2003  DATE OF DISCHARGE:                                   CONSULTATION   CHIEF COMPLAINT:  Nonhealing ulcers and pain lower extremities.   HISTORY OF PRESENT ILLNESS:  I was asked by Leighton Roach. Truett Perna, M.D. to  evaluate Nathan Nicholson for consideration for splenectomy. He is a 55 year old  white male with longstanding rheumatoid arthritis.  He has been on multiple  medications for this through the years.  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  He  has been on long-term steroids as well as other medications for his  rheumatoid arthritis.  Steroids were tapered and weaned off about a month  ago. He recently has been having worsening neutropenia and increasing  problems with infections. He was treated as an outpatient for pneumonia on  two occasions late last year and early this year. He now has developed  persistent nonhealing ulcerations of his lower extremities with staph  isolated on culture. These have not responded well to antibiotics and wound  care. The patient has not had response of his neutropenia to G-CSF. He was  recently hospitalized for IV antibiotics and management of his wounds.  He  has had a bone marrow biopsy done yesterday and the results are pending. On  questioning he does have some occasional mild left upper quadrant abdominal  pain.   PAST MEDICAL HISTORY:  Most significant as above.  He has had hammer toe  repair in 2004.   CURRENT MEDICATIONS:  1. IV Ancef.  2. Oxycodone for pain.  3. Bactroban nasal ointment.   ALLERGIES:  He has no known drug allergies.   SOCIAL HISTORY:  He  is married and disabled.  He smokes a 1/2 pack of  cigarettes per day, does not drink alcohol.   FAMILY HISTORY:  Significant for rheumatoid arthritis in his mother.   REVIEW OF SYMPTOMS:  GENERAL:  No recent fever or chills.  Weight is stable.  RESPIRATORY:  Denies recent cough, shortness of breath, wheezing. CARDIAC:  Denies chest pain, palpitations, history of heart disease. ABDOMEN:  Occasion left upper quadrant mild pain. __________ normal, no bleeding.  URINE:  Denies urinary frequency, burning.  MUSCULOSKELETAL:  He has chronic  joint pain secondary to his arthritis that has been fairly stable recently.   PHYSICAL EXAMINATION:  VITAL SIGNS:  Temperature is 97.7, pulse 97,  respirations 18, blood pressure 134/88.  GENERAL:  He is a thin, alert, pleasant, white male in no acute distress.  SKIN:  There are a few scattered reddish papules on  the extremities and on  the lower extremities beneath the knees on the right there were two discreet  full thickness skin ulcers with necrotic base measuring 2 and 1 cm with  minimal surrounding erythema. There were a few what appeared to be ruptured  pustules also scattered on the lower extremities below the knees.  HEENT:  Sclerae nonicteric, nares and oropharynx clear, no masses.  Adenopathy none palpable.  LUNGS:  Clear to auscultation without increased work of breathing.  CARDIAC:  Rate and rhythm without murmur, no edema.  ABDOMEN:  Nondistended. Spleen tip is palpable 3 or 4 cm above the level of  the umbilicus and left upper quadrant. There is mild tenderness in the left  upper quadrant.  No other palpable masses or organomegaly.  EXTREMITIES:  There are chronic changes particularly in the hands with joint  deformity.  NEUROLOGIC:  He is alert __________ motor and sensory exam is  grossly  normal.   LABORATORY DATA:  Most recent white count 0.8000, hemoglobin 10.8, platelets  89,000, electrolytes normal, bilirubin 2.2.  Culture of leg  wound has grown  staph aureus.   ASSESSMENT/PLAN:  Felty's syndrome with worsening neutropenia unresponsive  to medical management and now with worsening infections including recent  pneumonia and persistent nonhealing skin infections.  This has not responded  to medical intervention.  A splenectomy would seem reasonable with an  expected response rate of his neutropenia of about 75% from the literature  that I have seen. This was discussed with the patient and he is willing to  proceed if it is felt to be in his best interest. Will await results of his  bone marrow biopsy and followup with you.                                               Lorne Skeens. Hoxworth, M.D.    Tory Emerald  D:  05/27/2003  T:  05/29/2003  Job:  [id number]

## 2010-07-22 NOTE — Discharge Summary (Signed)
NAME:  Nathan Nicholson, Nathan Nicholson                        ACCOUNT NO.:  [id number]   MEDICAL RECORD NO.:  [id number]                   PATIENT TYPE:  INP   LOCATION:  0478                                 FACILITY:  [facility]   PHYSICIAN:  Sharlet Salina T. Hoxworth, M.D.          DATE OF BIRTH:  [birth year]   DATE OF ADMISSION:  07/03/2003  DATE OF DISCHARGE:  07/06/2003                                 DISCHARGE SUMMARY   DISCHARGE DIAGNOSIS:  Felty's syndrome.   OPERATIONS AND PROCEDURES:  Laparoscopic splenectomy, July 03, 2003.   HISTORY OF PRESENT ILLNESS:  Nathan Nicholson is a 55 year old male with long-  standing rheumatoid arthritis treated with multiple medications through the  years.  He has at least a 2-year history of Felty's syndrome with  neutropenia and splenomegaly.  He has been on long-term steroids, as well as  other medications for his arthritis.  Steroids were tapered and weaned about  a month ago.  He has been having worsening neutropenia and increasing  problems with infections including pneumonia and skin and soft tissue  ulcerations secondary to staph.  He has not had response to GCSF.  He has  required hospitalization for antibiotic management of his wounds.  Bone  marrow biopsy was consistent with Felty's syndrome.  Splenectomy has been  recommended and accepted due to his severe persistent neutropenia, and he  was admitted on the day of surgery.   PAST MEDICAL HISTORY:  1. Significant mainly as above.  2. He has also hammertoe repair in 2004.   MEDICATIONS:  1. Keflex.  2. Oxycodone for pain.  3. Bactroban nasal ointment.   ALLERGIES:  No known drug allergies.   SOCIAL HISTORY/FAMILY HISTORY/REVIEW OF SYSTEMS:  See H&P.   PERTINENT PHYSICAL EXAMINATION:  VITAL SIGNS:  He is afebrile.  Vital signs  within normal limits.  GENERAL:  Thin, pleasant white male in no acute distress.  He has palpable  splenomegaly 3-4 cm above the level of the umbilicus and left upper   quadrant.  EXTREMITIES:  Some chronic ulceration of the lower extremity up to 1 cm in  diameter.   HOSPITAL COURSE:  The patient was admitted on the morning of his procedure.  He underwent an uneventful laparoscopic splenectomy.  He had some fever over  the first 24 hours, felt likely secondary to atelectasis.  Did show  atelectasis on his chest x-ray, and he was treated with pulmonary toilet.  On the secondary postoperative day, his temperature was 101.5.  He was  feeling better with minimal discomfort.  The abdomen was soft.  Hemoglobin  was 10.2, white count 5000, platelet count 136.  Preoperative white blood  count had been 0.7.  Pulmonary toilet was continued.  He continued on p.o.  Keflex as preoperatively.  By Jul 06, 2003, he was afebrile.  He had just  some mild  soreness.  Abdomen was nontender.  Repeat chest x-ray was negative.  At this  point, he was felt ready for discharge.  Will continue p.o. Keflex as he was  on previously for another week.  He was on Vicodin for pain.   FOLLOW UP:  I will see him in my office in 1-2 weeks.                                               Lorne Skeens. Hoxworth, M.D.    Tory Emerald  D:  07/20/2003  T:  07/21/2003  Job:  [id number]   cc:   Jillyn Hidden B. Truett Perna, M.D.  [address]- [facility]  [place]  [place] [address]  Fax: [phone]

## 2010-07-22 NOTE — H&P (Signed)
NAMESRIANSH, Nathan Nicholson                        ACCOUNT NO.:  [id number]   MEDICAL RECORD NO.:  [id number]                   PATIENT TYPE:  INP   LOCATION:  0482                                 FACILITY:  [facility]   PHYSICIAN:  Leighton Roach. Truett Perna, M.D.              DATE OF BIRTH:  [birth year]   DATE OF ADMISSION:  05/21/2003  DATE OF DISCHARGE:                                HISTORY & PHYSICAL   CHIEF COMPLAINT:  Right leg ulcer, pain.   HISTORY OF PRESENT ILLNESS:  Nathan. Nathan Nicholson is a 55 year old gentleman with a  history of rheumatoid arthritis/Feltys syndrome with severe neutropenia and  splenomegaly.  He was most recently seen in the office on May 05, 2003  secondary to non-healing ulcers involving the right leg.  Due to concern for  a possible infectious process, he was referred to Dr. Londell Moh, dermatology.  Two punch biopsies of the ulcers were done showing psoriasiform and  spongiotic dermatitis in 1 ulcer, and fragments of fiber adipose tissue with  keratin granuloma and bacterial cocci in the other area of ulceration (DC 05-  130).  Culture was also obtained which showed moderate staff aureus,  abundant __________ (see corynebacterium species).  He was started on  erythromycin per Dr. Londell Moh.   Nathan Nicholson was seen in the office for scheduled follow up on May 20, 2003.  At that time, he reported no change in the leg ulcers.  Right leg pain has  worsened over the past 2 weeks.  He is currently requiring oxycodone  approximately every 4 hours for pain control.  He denies any fever.  He has  noticed several new pimple-like lesions on the right leg, left thigh, and  left arm.  These new lesions are not pruritic or painful.  Upon examination,  the ulcers over the right lower leg were now noted to have confluent  surrounding erythema.  Due to concern for cellulitis, as well as possible  osteomyelitis in this severely neutropenic patient, the decision was made to  admit him for IV  antibiotics.  Dermatology and infectious disease consults  will be obtained.   PAST MEDICAL HISTORY:  1. Rheumatoid arthritis/Feltys syndrome/neutropenia/splenomegaly.  2. Questionable recent left lower lobe pneumonia status post course of     antibiotics.  3. Dental extraction of decayed teeth in June of 2002.   CURRENT MEDICATIONS:  1. Erythromycin.  2. Oxycodone.   ALLERGIES:  No known drug allergies.   FAMILY HISTORY:  Mother with rheumatoid arthritis.   SOCIAL HISTORY:  Nathan. Nathan Nicholson lives in [place].  He is married.  He has 2  children.   REVIEW OF SYSTEMS:  Nathan. Nathan Nicholson denies any fever or chills.  Right leg pain  has worsened over the past 2 weeks.  He denies any shortness of breath or  cough.  He denies any change in bowel habits.  He denies any urinary  symptoms.   PHYSICAL EXAMINATION:  VITAL SIGNS:  Temperature 97, heart rate 101,  respirations 20, blood pressure 142/85.  Weight 179 pounds.  GENERAL:  Caucasian male in no acute distress.  HEENT:  Normocephalic and atraumatic.  Pupils equal, round and reactive to  light.  Extraocular movements are intact.  Sclerae are anicteric.  Oropharynx is without thrush or ulceration.  LYMPHS:  No cervical, supraclavicular, or axillary lymph nodes.  CHEST:  Lungs are clear bilaterally; no wheezes or rales.  CARDIOVASCULAR:  Regular rate and rhythm.  ABDOMEN:  Soft.  Spleen is palpable to approximately the level of the  umbilicus.  EXTREMITIES:  Right lower extremity edema below the knee.  Two areas of  ulceration with black eschar covering; confluence surrounding erythema.  Vesicular-appearing lesions just below the right knee, left posterior thigh,  and left forearm.  NEUROLOGIC:  Alert and oriented.  Moves all extremities.   LABORATORY DATA:  Pending.   IMPRESSION AND PLAN:  1. Rheumatoid arthritis/Feltys syndrome with severe neutropenia and     splenomegaly.  2. Non-healing leg ulcers/cellulitis, with concern for  possible     osteomyelitis - he will be admitted for IV antibiotics.  We will ask Dr.     Londell Moh and Dr. Ninetta Lights to evaluate.  3. New vesicular-appearing skin lesions - dermatology and I.D. to evaluate.  4. Pain secondary to #2 - oxycodone and morphine for pain control.   The patient interviewed and examined by Dr. Truett Perna; plan reviewed.     Lonna Cobb, N.P.                         Leighton Roach. Truett Perna, M.D.    LT/MEDQ  D:  05/22/2003  T:  05/23/2003  Job:  [id number]

## 2010-07-22 NOTE — Op Note (Signed)
NAMEDONATHAN, Nathan Nicholson              ACCOUNT NO.:  [id number]   MEDICAL RECORD NO.:  [id number]          PATIENT TYPE:  AMB   LOCATION:  NESC                         FACILITY:  [facility]   PHYSICIAN:  Sharlet Salina T. Hoxworth, M.D.DATE OF BIRTH:  [birth year]   DATE OF PROCEDURE:  05/02/2005  DATE OF DISCHARGE:                                 OPERATIVE REPORT   PREOPERATIVE DIAGNOSIS:  Supraumbilical hernia.   POSTOPERATIVE DIAGNOSIS:  Supraumbilical hernia.   SURGICAL PROCEDURES:  Repair of supraumbilical hernia with mesh.   SURGEON:  Lorne Skeens. Hoxworth, M.D.   ANESTHESIA:  General.   BRIEF HISTORY:  Nathan Nicholson Nicholson is a 55 year old male who presents with a  gradually increasing and painful bulge presenting just above the umbilicus.  Examination confirms an approximately 2 cm tender reducible supraumbilical  hernia. Repair using mesh has been recommended and accepted. The nature of  the procedure, indications and risks of bleeding, infection, and recurrence  were discussed and understood preoperatively. He is now brought to the  operating room for this procedure.   DESCRIPTION OF PROCEDURE:  The patient was brought to the operating room,  placed in a supine position on the operating table and laryngeal mask  general anesthesia was induced. The abdomen was widely sterilely prepped and  draped. He received preoperative antibiotics. The correct patient and  procedure were verified. A small midline incision was made directly above  the umbilicus and dissection carried down through the subcutaneous tissue. A  hernia sac was encountered which was dissected away from surrounding  subcutaneous tissue bluntly. It was freed down to its neck which was about a  centimeter to a centimeter and a half in diameter. The hernia sac was  completely reduced beneath the fascia. The subcutaneous flaps were raised  off the fascia for several centimeters in all directions. The primary defect  was then closed  transversely with several interrupted zero Prolene inverting  sutures. A piece of Parietex mesh was then trimmed to size for an onlay  allowing for overlap for at least 3 cm in all directions. This was then  sutured in place at its periphery with interrupted zero Prolene sutures with  nice broad coverage of the defect. The surrounding fascia appeared normal.  The soft tissue was infiltrated with Marcaine. The wound was irrigated and  hemostasis assured. The subcutaneous tissue was closed with running 3-0  Monocryl tacking it down at the fascial level to close the dead space. The  skin was closed with running subcuticular 4-0 Monocryl and Steri-Strips.  Sponge, needle and instrument counts were correct. A dry sterile dressings  was applied and the patient taken to recovery room in good condition.      Lorne Skeens. Hoxworth, M.D.  Electronically Signed    BTH/MEDQ  D:  05/02/2005  T:  05/03/2005  Job:  [id number]

## 2011-02-23 ENCOUNTER — Telehealth: Payer: Self-pay | Admitting: Oncology

## 2011-02-23 NOTE — Telephone Encounter (Signed)
lm at home # with appt info,also mailed for 03/30/11    aom  #3 with appt info.  Also advised mailed

## 2011-03-30 ENCOUNTER — Telehealth: Payer: Self-pay | Admitting: Oncology

## 2011-03-30 ENCOUNTER — Other Ambulatory Visit (HOSPITAL_BASED_OUTPATIENT_CLINIC_OR_DEPARTMENT_OTHER): Payer: Medicare Other | Admitting: Lab

## 2011-03-30 ENCOUNTER — Ambulatory Visit (HOSPITAL_BASED_OUTPATIENT_CLINIC_OR_DEPARTMENT_OTHER): Payer: Medicare Other | Admitting: Oncology

## 2011-03-30 DIAGNOSIS — D709 Neutropenia, unspecified: Secondary | ICD-10-CM

## 2011-03-30 DIAGNOSIS — M0500 Felty's syndrome, unspecified site: Secondary | ICD-10-CM

## 2011-03-30 DIAGNOSIS — D702 Other drug-induced agranulocytosis: Secondary | ICD-10-CM

## 2011-03-30 DIAGNOSIS — D72829 Elevated white blood cell count, unspecified: Secondary | ICD-10-CM

## 2011-03-30 DIAGNOSIS — M069 Rheumatoid arthritis, unspecified: Secondary | ICD-10-CM

## 2011-03-30 LAB — CBC WITH DIFFERENTIAL/PLATELET
Basophils Absolute: 0.2 10*3/uL — ABNORMAL HIGH (ref 0.0–0.1)
EOS%: 6 % (ref 0.0–7.0)
HCT: 51.6 % — ABNORMAL HIGH (ref 38.4–49.9)
HGB: 17.1 g/dL (ref 13.0–17.1)
MCH: 29.5 pg (ref 27.2–33.4)
MCV: 89.1 fL (ref 79.3–98.0)
MONO%: 16.9 % — ABNORMAL HIGH (ref 0.0–14.0)
NEUT%: 32.8 % — ABNORMAL LOW (ref 39.0–75.0)
Platelets: 275 10*3/uL (ref 140–400)

## 2011-03-30 NOTE — Progress Notes (Signed)
OFFICE PROGRESS NOTE   INTERVAL HISTORY:   He returns as scheduled. He feels well. There've been no recent severe infections. He felt "sick "after receiving the influenza vaccine. He is no longer on treatment for rheumatoid arthritis. He reports no skin lesions.  He is now smoking only a few cigarettes per day.  Objective:  Vital signs in last 24 hours:  Blood pressure 108/66, pulse 60, temperature 96.9 F (36.1 C), height 5\' 4"  (1.626 m), weight 218 lb 3.2 oz (98.975 kg).    HEENT: Neck without mass Lymphatics: No cervical, supraclavicular, or axillary nodes Resp: Scattered and inspiratory rhonchi and wheeze bilaterally. No respiratory distress. Cardio: Regular rate and rhythm GI: No hepatomegaly, nontender Vascular: No leg edema  Skin: Multiple abrasions at the pretibial area bilaterally. No evidence of infection.   Lab Results:  Lab Results  Component Value Date   WBC 11.1* 03/30/2011   HGB 17.1 03/30/2011   HCT 51.6* 03/30/2011   MCV 89.1 03/30/2011   PLT 275 03/30/2011   ANC 3.6   Medications: I have reviewed the patient's current medications.  Assessment/Plan: 1. Rheumatoid arthritis -  Currently maintained off of medical therapy. 2. 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 3. History of recurrent infections in the setting of severe neutropenia. 4. "Skin lesions over the abdominal wall and legs in the past, no apparent infectious lesions today 5. Probable COPD. 6. Leukocytosis (lymphocytosis) - most likely related to the post-splenectomy state-stable   Disposition:  He is stable from a hematologic standpoint. He would like to continue followup in the hematology clinic. He will return for an office visit and CBC in one year. He last received a pneumococcal vaccine in 2009.   Lucile Shutters, MD  03/30/2011  3:52 PM

## 2011-03-30 NOTE — Telephone Encounter (Signed)
gv pt appt schedule for jan 2014

## 2012-03-28 ENCOUNTER — Telehealth: Payer: Self-pay | Admitting: Oncology

## 2012-03-28 ENCOUNTER — Ambulatory Visit (HOSPITAL_BASED_OUTPATIENT_CLINIC_OR_DEPARTMENT_OTHER): Payer: Medicare Other | Admitting: Oncology

## 2012-03-28 ENCOUNTER — Other Ambulatory Visit (HOSPITAL_BASED_OUTPATIENT_CLINIC_OR_DEPARTMENT_OTHER): Payer: Medicare Other | Admitting: Lab

## 2012-03-28 VITALS — BP 118/66 | HR 65 | Temp 97.4°F | Resp 20 | Ht 64.0 in | Wt 231.7 lb

## 2012-03-28 DIAGNOSIS — Z23 Encounter for immunization: Secondary | ICD-10-CM

## 2012-03-28 DIAGNOSIS — Z9081 Acquired absence of spleen: Secondary | ICD-10-CM

## 2012-03-28 DIAGNOSIS — M069 Rheumatoid arthritis, unspecified: Secondary | ICD-10-CM

## 2012-03-28 DIAGNOSIS — D709 Neutropenia, unspecified: Secondary | ICD-10-CM

## 2012-03-28 LAB — MANUAL DIFFERENTIAL
ALC: 5.7 10*3/uL — ABNORMAL HIGH (ref 0.9–3.3)
EOS: 4 % (ref 0–7)
MONO: 8 % (ref 0–14)
Metamyelocytes: 0 % (ref 0–0)
Myelocytes: 0 % (ref 0–0)
PLT EST: ADEQUATE
PROMYELO: 0 % (ref 0–0)
Variant Lymph: 0 % (ref 0–0)

## 2012-03-28 LAB — CBC WITH DIFFERENTIAL/PLATELET
MCHC: 32.9 g/dL (ref 32.0–36.0)
Platelets: 243 10*3/uL (ref 140–400)
RBC: 5.73 10*6/uL (ref 4.20–5.82)
RDW: 14.3 % (ref 11.0–14.6)

## 2012-03-28 NOTE — Progress Notes (Signed)
   [facility]    OFFICE PROGRESS NOTE   INTERVAL HISTORY:   He returns as scheduled. He feels well. No recent infection. Intermittent "skin lesions "resolve spontaneously. The rheumatoid arthritis is currently inactive. He has received an influenza vaccine.  Objective:  Vital signs in last 24 hours:  Blood pressure 118/66, pulse 65, temperature 97.4 F (36.3 C), temperature source Oral, resp. rate 20, height 5\' 4"  (1.626 m), weight 231 lb 11.2 oz (105.098 kg).    HEENT: Neck without mass Lymphatics: No cervical, supraclavicular, axillary, or inguinal nodes Resp: Inspiratory rhonchi at the posterior base bilaterally with bilateral mild expiratory wheeze, no respiratory distress Cardio: Regular rate and rhythm GI: No hepatomegaly Vascular: No leg edema  Skin: Abrasions at the pretibial area bilaterally   Lab Results:  Lab Results  Component Value Date   WBC 10.9* 03/28/2012   HGB 16.9 03/28/2012   HCT 51.3* 03/28/2012   MCV 89.5 03/28/2012   PLT 243 03/28/2012   ANC 3.8   Medications: I have reviewed the patient's current medications.  Assessment/Plan: 1. Rheumatoid arthritis - Currently maintained off of medical therapy. 2. 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 3. History of recurrent infections in the setting of severe neutropenia. 4. "Skin lesions over the abdominal wall and legs in the past, no apparent infectious lesions today 5. COPD. 6. Leukocytosis (lymphocytosis) - most likely related to the post-splenectomy state-stable  Disposition:  Mr. Sabine appears stable. He received a pneumococcal vaccine today. He will return for an office visit and CBC in one year. He knows to seek medical attention for symptoms of infection.   Thornton Papas, MD  03/28/2012  10:46 AM

## 2012-03-28 NOTE — Telephone Encounter (Signed)
appts made and printed for pt aom °

## 2013-03-11 ENCOUNTER — Encounter: Payer: Self-pay | Admitting: Oncology

## 2013-03-11 ENCOUNTER — Ambulatory Visit (HOSPITAL_BASED_OUTPATIENT_CLINIC_OR_DEPARTMENT_OTHER): Payer: Medicare Other | Admitting: Oncology

## 2013-03-11 ENCOUNTER — Other Ambulatory Visit (HOSPITAL_BASED_OUTPATIENT_CLINIC_OR_DEPARTMENT_OTHER): Payer: Medicare Other

## 2013-03-11 VITALS — BP 108/70 | HR 72 | Temp 97.8°F | Resp 18 | Ht 64.0 in | Wt 225.0 lb

## 2013-03-11 DIAGNOSIS — D709 Neutropenia, unspecified: Secondary | ICD-10-CM

## 2013-03-11 DIAGNOSIS — Z9081 Acquired absence of spleen: Secondary | ICD-10-CM

## 2013-03-11 DIAGNOSIS — Z87898 Personal history of other specified conditions: Secondary | ICD-10-CM

## 2013-03-11 DIAGNOSIS — F17200 Nicotine dependence, unspecified, uncomplicated: Secondary | ICD-10-CM

## 2013-03-11 DIAGNOSIS — D72829 Elevated white blood cell count, unspecified: Secondary | ICD-10-CM

## 2013-03-11 LAB — CBC WITH DIFFERENTIAL/PLATELET
BASO%: 1.3 % (ref 0.0–2.0)
BASOS ABS: 0.2 10*3/uL — AB (ref 0.0–0.1)
EOS%: 7.8 % — AB (ref 0.0–7.0)
Eosinophils Absolute: 0.9 10*3/uL — ABNORMAL HIGH (ref 0.0–0.5)
HEMATOCRIT: 48.3 % (ref 38.4–49.9)
HEMOGLOBIN: 16.3 g/dL (ref 13.0–17.1)
LYMPH%: 29.8 % (ref 14.0–49.0)
MCH: 30.5 pg (ref 27.2–33.4)
MCHC: 33.8 g/dL (ref 32.0–36.0)
MCV: 90.2 fL (ref 79.3–98.0)
MONO#: 2.1 10*3/uL — ABNORMAL HIGH (ref 0.1–0.9)
MONO%: 17.3 % — AB (ref 0.0–14.0)
NEUT#: 5.2 10*3/uL (ref 1.5–6.5)
NEUT%: 43.8 % (ref 39.0–75.0)
RBC: 5.35 10*6/uL (ref 4.20–5.82)
RDW: 14.7 % — ABNORMAL HIGH (ref 11.0–14.6)
WBC: 12 10*3/uL — ABNORMAL HIGH (ref 4.0–10.3)
lymph#: 3.6 10*3/uL — ABNORMAL HIGH (ref 0.9–3.3)

## 2013-03-11 LAB — TECHNOLOGIST REVIEW

## 2013-03-11 NOTE — Progress Notes (Signed)
Continues to smoke despite Wellbutrin bid and being on multiple inhalers and nebulizers. Reports being hospitalized X 2 in October for dyspnea in [place]. Interested in trying "the patch". Declines smoking cessation classes here in [place] far to drive". Suggested he check with [facility] to see if any are offered there at no cost.

## 2013-03-11 NOTE — Progress Notes (Signed)
   [facility]    OFFICE PROGRESS NOTE   INTERVAL HISTORY:   Nathan Nicholson returns for scheduled followup of neutropenia. He reports no recent infection. He has chronic exertional dyspnea. He continues to smoke. He is using inhalers and nebulizers. He received an influenza vaccine. He reports every 3 month followup with Nathan Nicholson. He reports no pain related to rheumatoid arthritis.  Objective:  Vital signs in last 24 hours:  Blood pressure 108/70, pulse 72, temperature 97.8 F (36.6 C), temperature source Oral, resp. rate 18, height 5\' 4"  (1.626 m), weight 225 lb (102.059 kg).    HEENT: Neck without mass Lymphatics: No cervical, supraclavicular, axillary, or inguinal nodes Resp: Scattered bilateral rhonchi/wheezing, no respiratory distress Cardio: Regular rate and rhythm GI: No hepatomegaly, nontender Vascular: No leg edema  Skin: Yeast rash in the groin bilaterally     Lab Results:  Lab Results  Component Value Date   WBC 12.0* 03/11/2013   HGB 16.3 03/11/2013   HCT 48.3 03/11/2013   MCV 90.2 03/11/2013   PLT 372 Large & giant platelets 03/11/2013   NEUTROABS 5.2 03/11/2013      Medications: I have reviewed the patient's current medications.  Assessment/Plan: 1. Rheumatoid arthritis - Currently maintained off of medical therapy. 2. 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 Last pneumococcal vaccine 03/28/2012 3. History of recurrent infections in the setting of severe neutropenia. 4. "Skin lesions over the abdominal wall and legs in the past, no apparent infectious lesions today 5. COPD. 6. 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 Ongoing tobacco use-he declined a referral to the smoking cessation program   Disposition:  Nathan Nicholson appears stable. He is not neutropenic. He plans to continue clinical followup with Dr. Shon Baton. He is not scheduled for a followup  appointment in the hematology clinic. We will see him in the future as needed. He should remain up-to-date on the influenza and pneumococcal vaccines. I encouraged him to discontinue smoking. He knows to seek medical attention for signs/symptoms of infection.   Samuel Germany, MD  03/11/2013  9:58 AM

## 2015-04-06 DIAGNOSIS — Z7982 Long term (current) use of aspirin: Secondary | ICD-10-CM | POA: Diagnosis not present

## 2015-04-06 DIAGNOSIS — R05 Cough: Secondary | ICD-10-CM | POA: Diagnosis not present

## 2015-04-06 DIAGNOSIS — E1129 Type 2 diabetes mellitus with other diabetic kidney complication: Secondary | ICD-10-CM | POA: Diagnosis not present

## 2015-04-06 DIAGNOSIS — J441 Chronic obstructive pulmonary disease with (acute) exacerbation: Secondary | ICD-10-CM | POA: Diagnosis not present

## 2015-04-06 DIAGNOSIS — Z720 Tobacco use: Secondary | ICD-10-CM | POA: Diagnosis not present

## 2015-04-06 DIAGNOSIS — R809 Proteinuria, unspecified: Secondary | ICD-10-CM | POA: Diagnosis not present

## 2015-04-06 DIAGNOSIS — R6520 Severe sepsis without septic shock: Secondary | ICD-10-CM | POA: Diagnosis not present

## 2015-04-06 DIAGNOSIS — I10 Essential (primary) hypertension: Secondary | ICD-10-CM | POA: Diagnosis not present

## 2015-04-06 DIAGNOSIS — M069 Rheumatoid arthritis, unspecified: Secondary | ICD-10-CM | POA: Diagnosis not present

## 2015-04-06 DIAGNOSIS — E039 Hypothyroidism, unspecified: Secondary | ICD-10-CM | POA: Diagnosis not present

## 2015-04-06 DIAGNOSIS — E7800 Pure hypercholesterolemia, unspecified: Secondary | ICD-10-CM | POA: Diagnosis not present

## 2015-04-06 DIAGNOSIS — K219 Gastro-esophageal reflux disease without esophagitis: Secondary | ICD-10-CM | POA: Diagnosis not present

## 2015-04-06 DIAGNOSIS — J9601 Acute respiratory failure with hypoxia: Secondary | ICD-10-CM | POA: Diagnosis not present

## 2015-04-06 DIAGNOSIS — A419 Sepsis, unspecified organism: Secondary | ICD-10-CM | POA: Diagnosis not present

## 2015-04-06 DIAGNOSIS — J180 Bronchopneumonia, unspecified organism: Secondary | ICD-10-CM | POA: Diagnosis not present

## 2015-04-06 DIAGNOSIS — R0602 Shortness of breath: Secondary | ICD-10-CM | POA: Diagnosis not present

## 2015-04-06 DIAGNOSIS — Z9081 Acquired absence of spleen: Secondary | ICD-10-CM | POA: Diagnosis not present

## 2015-04-07 DIAGNOSIS — J9690 Respiratory failure, unspecified, unspecified whether with hypoxia or hypercapnia: Secondary | ICD-10-CM | POA: Diagnosis not present

## 2015-04-10 DIAGNOSIS — J9601 Acute respiratory failure with hypoxia: Secondary | ICD-10-CM | POA: Diagnosis not present

## 2015-04-10 DIAGNOSIS — J441 Chronic obstructive pulmonary disease with (acute) exacerbation: Secondary | ICD-10-CM | POA: Diagnosis not present

## 2015-04-15 DIAGNOSIS — J449 Chronic obstructive pulmonary disease, unspecified: Secondary | ICD-10-CM | POA: Diagnosis not present

## 2015-04-15 DIAGNOSIS — I10 Essential (primary) hypertension: Secondary | ICD-10-CM | POA: Diagnosis not present

## 2015-04-15 DIAGNOSIS — R739 Hyperglycemia, unspecified: Secondary | ICD-10-CM | POA: Diagnosis not present

## 2015-04-15 DIAGNOSIS — J189 Pneumonia, unspecified organism: Secondary | ICD-10-CM | POA: Diagnosis not present

## 2015-04-15 DIAGNOSIS — Z716 Tobacco abuse counseling: Secondary | ICD-10-CM | POA: Diagnosis not present

## 2015-04-15 DIAGNOSIS — E782 Mixed hyperlipidemia: Secondary | ICD-10-CM | POA: Diagnosis not present

## 2015-05-08 DIAGNOSIS — J441 Chronic obstructive pulmonary disease with (acute) exacerbation: Secondary | ICD-10-CM | POA: Diagnosis not present

## 2015-05-08 DIAGNOSIS — J9601 Acute respiratory failure with hypoxia: Secondary | ICD-10-CM | POA: Diagnosis not present

## 2015-05-10 DIAGNOSIS — J449 Chronic obstructive pulmonary disease, unspecified: Secondary | ICD-10-CM | POA: Diagnosis not present

## 2015-06-03 DIAGNOSIS — J449 Chronic obstructive pulmonary disease, unspecified: Secondary | ICD-10-CM | POA: Diagnosis not present

## 2015-06-08 DIAGNOSIS — J9601 Acute respiratory failure with hypoxia: Secondary | ICD-10-CM | POA: Diagnosis not present

## 2015-06-08 DIAGNOSIS — J441 Chronic obstructive pulmonary disease with (acute) exacerbation: Secondary | ICD-10-CM | POA: Diagnosis not present

## 2015-07-06 DIAGNOSIS — J449 Chronic obstructive pulmonary disease, unspecified: Secondary | ICD-10-CM | POA: Diagnosis not present

## 2015-07-08 DIAGNOSIS — J441 Chronic obstructive pulmonary disease with (acute) exacerbation: Secondary | ICD-10-CM | POA: Diagnosis not present

## 2015-07-08 DIAGNOSIS — J9601 Acute respiratory failure with hypoxia: Secondary | ICD-10-CM | POA: Diagnosis not present

## 2015-07-15 DIAGNOSIS — R739 Hyperglycemia, unspecified: Secondary | ICD-10-CM | POA: Diagnosis not present

## 2015-07-15 DIAGNOSIS — E8881 Metabolic syndrome: Secondary | ICD-10-CM | POA: Diagnosis not present

## 2015-07-15 DIAGNOSIS — M069 Rheumatoid arthritis, unspecified: Secondary | ICD-10-CM | POA: Diagnosis not present

## 2015-07-15 DIAGNOSIS — I10 Essential (primary) hypertension: Secondary | ICD-10-CM | POA: Diagnosis not present

## 2015-07-15 DIAGNOSIS — E782 Mixed hyperlipidemia: Secondary | ICD-10-CM | POA: Diagnosis not present

## 2015-07-15 DIAGNOSIS — J449 Chronic obstructive pulmonary disease, unspecified: Secondary | ICD-10-CM | POA: Diagnosis not present

## 2015-07-15 DIAGNOSIS — E538 Deficiency of other specified B group vitamins: Secondary | ICD-10-CM | POA: Diagnosis not present

## 2015-07-15 DIAGNOSIS — Z6835 Body mass index (BMI) 35.0-35.9, adult: Secondary | ICD-10-CM | POA: Diagnosis not present

## 2015-07-15 DIAGNOSIS — M818 Other osteoporosis without current pathological fracture: Secondary | ICD-10-CM | POA: Diagnosis not present

## 2015-07-15 DIAGNOSIS — F17200 Nicotine dependence, unspecified, uncomplicated: Secondary | ICD-10-CM | POA: Diagnosis not present

## 2015-07-15 DIAGNOSIS — Z79899 Other long term (current) drug therapy: Secondary | ICD-10-CM | POA: Diagnosis not present

## 2015-07-15 DIAGNOSIS — E669 Obesity, unspecified: Secondary | ICD-10-CM | POA: Diagnosis not present

## 2015-08-03 DIAGNOSIS — J449 Chronic obstructive pulmonary disease, unspecified: Secondary | ICD-10-CM | POA: Diagnosis not present

## 2015-08-08 DIAGNOSIS — J441 Chronic obstructive pulmonary disease with (acute) exacerbation: Secondary | ICD-10-CM | POA: Diagnosis not present

## 2015-08-08 DIAGNOSIS — J9601 Acute respiratory failure with hypoxia: Secondary | ICD-10-CM | POA: Diagnosis not present

## 2015-08-12 DIAGNOSIS — J449 Chronic obstructive pulmonary disease, unspecified: Secondary | ICD-10-CM | POA: Diagnosis not present

## 2015-08-27 DIAGNOSIS — J449 Chronic obstructive pulmonary disease, unspecified: Secondary | ICD-10-CM | POA: Diagnosis not present

## 2015-09-07 DIAGNOSIS — J441 Chronic obstructive pulmonary disease with (acute) exacerbation: Secondary | ICD-10-CM | POA: Diagnosis not present

## 2015-09-07 DIAGNOSIS — J9601 Acute respiratory failure with hypoxia: Secondary | ICD-10-CM | POA: Diagnosis not present

## 2015-09-22 DIAGNOSIS — J449 Chronic obstructive pulmonary disease, unspecified: Secondary | ICD-10-CM | POA: Diagnosis not present

## 2015-10-08 DIAGNOSIS — J9601 Acute respiratory failure with hypoxia: Secondary | ICD-10-CM | POA: Diagnosis not present

## 2015-10-08 DIAGNOSIS — J441 Chronic obstructive pulmonary disease with (acute) exacerbation: Secondary | ICD-10-CM | POA: Diagnosis not present

## 2015-10-20 DIAGNOSIS — Z9981 Dependence on supplemental oxygen: Secondary | ICD-10-CM | POA: Diagnosis not present

## 2015-10-20 DIAGNOSIS — M069 Rheumatoid arthritis, unspecified: Secondary | ICD-10-CM | POA: Diagnosis not present

## 2015-10-20 DIAGNOSIS — J9621 Acute and chronic respiratory failure with hypoxia: Secondary | ICD-10-CM | POA: Diagnosis not present

## 2015-10-20 DIAGNOSIS — J189 Pneumonia, unspecified organism: Secondary | ICD-10-CM | POA: Diagnosis not present

## 2015-10-20 DIAGNOSIS — Z7982 Long term (current) use of aspirin: Secondary | ICD-10-CM | POA: Diagnosis not present

## 2015-10-20 DIAGNOSIS — R079 Chest pain, unspecified: Secondary | ICD-10-CM | POA: Diagnosis not present

## 2015-10-20 DIAGNOSIS — J440 Chronic obstructive pulmonary disease with acute lower respiratory infection: Secondary | ICD-10-CM | POA: Diagnosis not present

## 2015-10-20 DIAGNOSIS — E669 Obesity, unspecified: Secondary | ICD-10-CM | POA: Diagnosis not present

## 2015-10-20 DIAGNOSIS — J441 Chronic obstructive pulmonary disease with (acute) exacerbation: Secondary | ICD-10-CM | POA: Diagnosis not present

## 2015-10-20 DIAGNOSIS — Z716 Tobacco abuse counseling: Secondary | ICD-10-CM | POA: Diagnosis not present

## 2015-10-20 DIAGNOSIS — Z720 Tobacco use: Secondary | ICD-10-CM | POA: Diagnosis not present

## 2015-10-20 DIAGNOSIS — R000 Tachycardia, unspecified: Secondary | ICD-10-CM | POA: Diagnosis not present

## 2015-10-20 DIAGNOSIS — Z79899 Other long term (current) drug therapy: Secondary | ICD-10-CM | POA: Diagnosis not present

## 2015-10-20 DIAGNOSIS — I10 Essential (primary) hypertension: Secondary | ICD-10-CM | POA: Diagnosis not present

## 2015-10-20 DIAGNOSIS — E785 Hyperlipidemia, unspecified: Secondary | ICD-10-CM | POA: Diagnosis not present

## 2015-10-20 DIAGNOSIS — K219 Gastro-esophageal reflux disease without esophagitis: Secondary | ICD-10-CM | POA: Diagnosis not present

## 2015-10-20 DIAGNOSIS — Z6839 Body mass index (BMI) 39.0-39.9, adult: Secondary | ICD-10-CM | POA: Diagnosis not present

## 2015-10-20 DIAGNOSIS — Z713 Dietary counseling and surveillance: Secondary | ICD-10-CM | POA: Diagnosis not present

## 2015-10-28 DIAGNOSIS — I10 Essential (primary) hypertension: Secondary | ICD-10-CM | POA: Diagnosis not present

## 2015-10-28 DIAGNOSIS — E559 Vitamin D deficiency, unspecified: Secondary | ICD-10-CM | POA: Diagnosis not present

## 2015-10-28 DIAGNOSIS — Z6835 Body mass index (BMI) 35.0-35.9, adult: Secondary | ICD-10-CM | POA: Diagnosis not present

## 2015-10-28 DIAGNOSIS — Z79899 Other long term (current) drug therapy: Secondary | ICD-10-CM | POA: Diagnosis not present

## 2015-10-28 DIAGNOSIS — E782 Mixed hyperlipidemia: Secondary | ICD-10-CM | POA: Diagnosis not present

## 2015-10-28 DIAGNOSIS — J432 Centrilobular emphysema: Secondary | ICD-10-CM | POA: Diagnosis not present

## 2015-10-28 DIAGNOSIS — E538 Deficiency of other specified B group vitamins: Secondary | ICD-10-CM | POA: Diagnosis not present

## 2015-10-28 DIAGNOSIS — M069 Rheumatoid arthritis, unspecified: Secondary | ICD-10-CM | POA: Diagnosis not present

## 2015-10-28 DIAGNOSIS — R739 Hyperglycemia, unspecified: Secondary | ICD-10-CM | POA: Diagnosis not present

## 2015-11-02 DIAGNOSIS — J449 Chronic obstructive pulmonary disease, unspecified: Secondary | ICD-10-CM | POA: Diagnosis not present

## 2015-11-08 DIAGNOSIS — J441 Chronic obstructive pulmonary disease with (acute) exacerbation: Secondary | ICD-10-CM | POA: Diagnosis not present

## 2015-11-08 DIAGNOSIS — J9601 Acute respiratory failure with hypoxia: Secondary | ICD-10-CM | POA: Diagnosis not present

## 2015-12-02 DIAGNOSIS — J449 Chronic obstructive pulmonary disease, unspecified: Secondary | ICD-10-CM | POA: Diagnosis not present

## 2015-12-08 DIAGNOSIS — J441 Chronic obstructive pulmonary disease with (acute) exacerbation: Secondary | ICD-10-CM | POA: Diagnosis not present

## 2015-12-08 DIAGNOSIS — J449 Chronic obstructive pulmonary disease, unspecified: Secondary | ICD-10-CM | POA: Diagnosis not present

## 2015-12-08 DIAGNOSIS — J9601 Acute respiratory failure with hypoxia: Secondary | ICD-10-CM | POA: Diagnosis not present

## 2015-12-29 DIAGNOSIS — J449 Chronic obstructive pulmonary disease, unspecified: Secondary | ICD-10-CM | POA: Diagnosis not present

## 2016-01-08 DIAGNOSIS — J9601 Acute respiratory failure with hypoxia: Secondary | ICD-10-CM | POA: Diagnosis not present

## 2016-01-08 DIAGNOSIS — J441 Chronic obstructive pulmonary disease with (acute) exacerbation: Secondary | ICD-10-CM | POA: Diagnosis not present

## 2016-01-24 DIAGNOSIS — J449 Chronic obstructive pulmonary disease, unspecified: Secondary | ICD-10-CM | POA: Diagnosis not present

## 2016-02-03 DIAGNOSIS — R739 Hyperglycemia, unspecified: Secondary | ICD-10-CM | POA: Diagnosis not present

## 2016-02-03 DIAGNOSIS — E559 Vitamin D deficiency, unspecified: Secondary | ICD-10-CM | POA: Diagnosis not present

## 2016-02-03 DIAGNOSIS — Z1389 Encounter for screening for other disorder: Secondary | ICD-10-CM | POA: Diagnosis not present

## 2016-02-03 DIAGNOSIS — E669 Obesity, unspecified: Secondary | ICD-10-CM | POA: Diagnosis not present

## 2016-02-03 DIAGNOSIS — I10 Essential (primary) hypertension: Secondary | ICD-10-CM | POA: Diagnosis not present

## 2016-02-03 DIAGNOSIS — Z9181 History of falling: Secondary | ICD-10-CM | POA: Diagnosis not present

## 2016-02-03 DIAGNOSIS — J449 Chronic obstructive pulmonary disease, unspecified: Secondary | ICD-10-CM | POA: Diagnosis not present

## 2016-02-03 DIAGNOSIS — E782 Mixed hyperlipidemia: Secondary | ICD-10-CM | POA: Diagnosis not present

## 2016-02-03 DIAGNOSIS — Z23 Encounter for immunization: Secondary | ICD-10-CM | POA: Diagnosis not present

## 2016-02-03 DIAGNOSIS — Z6837 Body mass index (BMI) 37.0-37.9, adult: Secondary | ICD-10-CM | POA: Diagnosis not present

## 2016-02-07 DIAGNOSIS — J441 Chronic obstructive pulmonary disease with (acute) exacerbation: Secondary | ICD-10-CM | POA: Diagnosis not present

## 2016-02-07 DIAGNOSIS — J9601 Acute respiratory failure with hypoxia: Secondary | ICD-10-CM | POA: Diagnosis not present

## 2016-02-22 DIAGNOSIS — J449 Chronic obstructive pulmonary disease, unspecified: Secondary | ICD-10-CM | POA: Diagnosis not present

## 2016-03-09 DIAGNOSIS — J9601 Acute respiratory failure with hypoxia: Secondary | ICD-10-CM | POA: Diagnosis not present

## 2016-03-09 DIAGNOSIS — J441 Chronic obstructive pulmonary disease with (acute) exacerbation: Secondary | ICD-10-CM | POA: Diagnosis not present

## 2016-03-29 DIAGNOSIS — J449 Chronic obstructive pulmonary disease, unspecified: Secondary | ICD-10-CM | POA: Diagnosis not present

## 2016-04-09 DIAGNOSIS — J9601 Acute respiratory failure with hypoxia: Secondary | ICD-10-CM | POA: Diagnosis not present

## 2016-04-09 DIAGNOSIS — J441 Chronic obstructive pulmonary disease with (acute) exacerbation: Secondary | ICD-10-CM | POA: Diagnosis not present

## 2016-05-03 DIAGNOSIS — J449 Chronic obstructive pulmonary disease, unspecified: Secondary | ICD-10-CM | POA: Diagnosis not present

## 2016-05-04 DIAGNOSIS — M069 Rheumatoid arthritis, unspecified: Secondary | ICD-10-CM | POA: Diagnosis not present

## 2016-05-04 DIAGNOSIS — J449 Chronic obstructive pulmonary disease, unspecified: Secondary | ICD-10-CM | POA: Diagnosis not present

## 2016-05-04 DIAGNOSIS — E782 Mixed hyperlipidemia: Secondary | ICD-10-CM | POA: Diagnosis not present

## 2016-05-04 DIAGNOSIS — E538 Deficiency of other specified B group vitamins: Secondary | ICD-10-CM | POA: Diagnosis not present

## 2016-05-04 DIAGNOSIS — R739 Hyperglycemia, unspecified: Secondary | ICD-10-CM | POA: Diagnosis not present

## 2016-05-04 DIAGNOSIS — I10 Essential (primary) hypertension: Secondary | ICD-10-CM | POA: Diagnosis not present

## 2016-05-07 DIAGNOSIS — J9601 Acute respiratory failure with hypoxia: Secondary | ICD-10-CM | POA: Diagnosis not present

## 2016-05-07 DIAGNOSIS — J441 Chronic obstructive pulmonary disease with (acute) exacerbation: Secondary | ICD-10-CM | POA: Diagnosis not present

## 2016-05-29 DIAGNOSIS — J449 Chronic obstructive pulmonary disease, unspecified: Secondary | ICD-10-CM | POA: Diagnosis not present

## 2016-06-07 DIAGNOSIS — J441 Chronic obstructive pulmonary disease with (acute) exacerbation: Secondary | ICD-10-CM | POA: Diagnosis not present

## 2016-06-07 DIAGNOSIS — J9601 Acute respiratory failure with hypoxia: Secondary | ICD-10-CM | POA: Diagnosis not present

## 2016-07-07 DIAGNOSIS — J441 Chronic obstructive pulmonary disease with (acute) exacerbation: Secondary | ICD-10-CM | POA: Diagnosis not present

## 2016-07-07 DIAGNOSIS — J9601 Acute respiratory failure with hypoxia: Secondary | ICD-10-CM | POA: Diagnosis not present

## 2016-07-24 DIAGNOSIS — J449 Chronic obstructive pulmonary disease, unspecified: Secondary | ICD-10-CM | POA: Diagnosis not present

## 2016-08-07 DIAGNOSIS — J9601 Acute respiratory failure with hypoxia: Secondary | ICD-10-CM | POA: Diagnosis not present

## 2016-08-07 DIAGNOSIS — J441 Chronic obstructive pulmonary disease with (acute) exacerbation: Secondary | ICD-10-CM | POA: Diagnosis not present

## 2016-08-10 DIAGNOSIS — Z6833 Body mass index (BMI) 33.0-33.9, adult: Secondary | ICD-10-CM | POA: Diagnosis not present

## 2016-08-10 DIAGNOSIS — E559 Vitamin D deficiency, unspecified: Secondary | ICD-10-CM | POA: Diagnosis not present

## 2016-08-10 DIAGNOSIS — I10 Essential (primary) hypertension: Secondary | ICD-10-CM | POA: Diagnosis not present

## 2016-08-10 DIAGNOSIS — M069 Rheumatoid arthritis, unspecified: Secondary | ICD-10-CM | POA: Diagnosis not present

## 2016-08-10 DIAGNOSIS — E782 Mixed hyperlipidemia: Secondary | ICD-10-CM | POA: Diagnosis not present

## 2016-08-10 DIAGNOSIS — E669 Obesity, unspecified: Secondary | ICD-10-CM | POA: Diagnosis not present

## 2016-08-10 DIAGNOSIS — E538 Deficiency of other specified B group vitamins: Secondary | ICD-10-CM | POA: Diagnosis not present

## 2016-08-10 DIAGNOSIS — Z1389 Encounter for screening for other disorder: Secondary | ICD-10-CM | POA: Diagnosis not present

## 2016-08-10 DIAGNOSIS — J449 Chronic obstructive pulmonary disease, unspecified: Secondary | ICD-10-CM | POA: Diagnosis not present

## 2016-08-10 DIAGNOSIS — R739 Hyperglycemia, unspecified: Secondary | ICD-10-CM | POA: Diagnosis not present

## 2016-08-21 DIAGNOSIS — J449 Chronic obstructive pulmonary disease, unspecified: Secondary | ICD-10-CM | POA: Diagnosis not present

## 2016-09-06 DIAGNOSIS — J9601 Acute respiratory failure with hypoxia: Secondary | ICD-10-CM | POA: Diagnosis not present

## 2016-09-06 DIAGNOSIS — J441 Chronic obstructive pulmonary disease with (acute) exacerbation: Secondary | ICD-10-CM | POA: Diagnosis not present

## 2016-09-21 DIAGNOSIS — S299XXA Unspecified injury of thorax, initial encounter: Secondary | ICD-10-CM | POA: Diagnosis not present

## 2016-09-21 DIAGNOSIS — S8992XA Unspecified injury of left lower leg, initial encounter: Secondary | ICD-10-CM | POA: Diagnosis not present

## 2016-09-21 DIAGNOSIS — S86912A Strain of unspecified muscle(s) and tendon(s) at lower leg level, left leg, initial encounter: Secondary | ICD-10-CM | POA: Diagnosis not present

## 2016-09-21 DIAGNOSIS — S29011A Strain of muscle and tendon of front wall of thorax, initial encounter: Secondary | ICD-10-CM | POA: Diagnosis not present

## 2016-09-21 DIAGNOSIS — M25562 Pain in left knee: Secondary | ICD-10-CM | POA: Diagnosis not present

## 2016-09-21 DIAGNOSIS — R0781 Pleurodynia: Secondary | ICD-10-CM | POA: Diagnosis not present

## 2016-09-28 DIAGNOSIS — J449 Chronic obstructive pulmonary disease, unspecified: Secondary | ICD-10-CM | POA: Diagnosis not present

## 2016-10-07 DIAGNOSIS — J441 Chronic obstructive pulmonary disease with (acute) exacerbation: Secondary | ICD-10-CM | POA: Diagnosis not present

## 2016-10-07 DIAGNOSIS — J9601 Acute respiratory failure with hypoxia: Secondary | ICD-10-CM | POA: Diagnosis not present

## 2016-11-07 DIAGNOSIS — J441 Chronic obstructive pulmonary disease with (acute) exacerbation: Secondary | ICD-10-CM | POA: Diagnosis not present

## 2016-11-07 DIAGNOSIS — J9601 Acute respiratory failure with hypoxia: Secondary | ICD-10-CM | POA: Diagnosis not present

## 2016-11-09 DIAGNOSIS — E559 Vitamin D deficiency, unspecified: Secondary | ICD-10-CM | POA: Diagnosis not present

## 2016-11-09 DIAGNOSIS — I10 Essential (primary) hypertension: Secondary | ICD-10-CM | POA: Diagnosis not present

## 2016-11-09 DIAGNOSIS — E782 Mixed hyperlipidemia: Secondary | ICD-10-CM | POA: Diagnosis not present

## 2016-11-09 DIAGNOSIS — E538 Deficiency of other specified B group vitamins: Secondary | ICD-10-CM | POA: Diagnosis not present

## 2016-11-09 DIAGNOSIS — Z716 Tobacco abuse counseling: Secondary | ICD-10-CM | POA: Diagnosis not present

## 2016-11-09 DIAGNOSIS — Z6834 Body mass index (BMI) 34.0-34.9, adult: Secondary | ICD-10-CM | POA: Diagnosis not present

## 2016-11-09 DIAGNOSIS — J449 Chronic obstructive pulmonary disease, unspecified: Secondary | ICD-10-CM | POA: Diagnosis not present

## 2016-11-14 DIAGNOSIS — J449 Chronic obstructive pulmonary disease, unspecified: Secondary | ICD-10-CM | POA: Diagnosis not present

## 2016-12-07 DIAGNOSIS — J441 Chronic obstructive pulmonary disease with (acute) exacerbation: Secondary | ICD-10-CM | POA: Diagnosis not present

## 2016-12-07 DIAGNOSIS — J9601 Acute respiratory failure with hypoxia: Secondary | ICD-10-CM | POA: Diagnosis not present

## 2016-12-26 DIAGNOSIS — J449 Chronic obstructive pulmonary disease, unspecified: Secondary | ICD-10-CM | POA: Diagnosis not present

## 2017-01-07 DIAGNOSIS — J9601 Acute respiratory failure with hypoxia: Secondary | ICD-10-CM | POA: Diagnosis not present

## 2017-01-07 DIAGNOSIS — J441 Chronic obstructive pulmonary disease with (acute) exacerbation: Secondary | ICD-10-CM | POA: Diagnosis not present

## 2017-02-06 DIAGNOSIS — J9601 Acute respiratory failure with hypoxia: Secondary | ICD-10-CM | POA: Diagnosis not present

## 2017-02-06 DIAGNOSIS — J441 Chronic obstructive pulmonary disease with (acute) exacerbation: Secondary | ICD-10-CM | POA: Diagnosis not present

## 2017-02-15 DIAGNOSIS — J449 Chronic obstructive pulmonary disease, unspecified: Secondary | ICD-10-CM | POA: Diagnosis not present

## 2017-02-15 DIAGNOSIS — Z716 Tobacco abuse counseling: Secondary | ICD-10-CM | POA: Diagnosis not present

## 2017-02-15 DIAGNOSIS — M069 Rheumatoid arthritis, unspecified: Secondary | ICD-10-CM | POA: Diagnosis not present

## 2017-02-15 DIAGNOSIS — Z23 Encounter for immunization: Secondary | ICD-10-CM | POA: Diagnosis not present

## 2017-02-15 DIAGNOSIS — Z6833 Body mass index (BMI) 33.0-33.9, adult: Secondary | ICD-10-CM | POA: Diagnosis not present

## 2017-02-15 DIAGNOSIS — E559 Vitamin D deficiency, unspecified: Secondary | ICD-10-CM | POA: Diagnosis not present

## 2017-02-15 DIAGNOSIS — E782 Mixed hyperlipidemia: Secondary | ICD-10-CM | POA: Diagnosis not present

## 2017-02-15 DIAGNOSIS — I10 Essential (primary) hypertension: Secondary | ICD-10-CM | POA: Diagnosis not present

## 2017-02-15 DIAGNOSIS — R739 Hyperglycemia, unspecified: Secondary | ICD-10-CM | POA: Diagnosis not present

## 2017-02-15 DIAGNOSIS — Z79899 Other long term (current) drug therapy: Secondary | ICD-10-CM | POA: Diagnosis not present

## 2017-03-09 DIAGNOSIS — J9601 Acute respiratory failure with hypoxia: Secondary | ICD-10-CM | POA: Diagnosis not present

## 2017-03-09 DIAGNOSIS — J441 Chronic obstructive pulmonary disease with (acute) exacerbation: Secondary | ICD-10-CM | POA: Diagnosis not present

## 2017-03-28 DIAGNOSIS — J449 Chronic obstructive pulmonary disease, unspecified: Secondary | ICD-10-CM | POA: Diagnosis not present

## 2017-05-03 DIAGNOSIS — J449 Chronic obstructive pulmonary disease, unspecified: Secondary | ICD-10-CM | POA: Diagnosis not present

## 2017-05-04 DIAGNOSIS — J449 Chronic obstructive pulmonary disease, unspecified: Secondary | ICD-10-CM | POA: Diagnosis not present

## 2017-05-17 DIAGNOSIS — M069 Rheumatoid arthritis, unspecified: Secondary | ICD-10-CM | POA: Diagnosis not present

## 2017-05-17 DIAGNOSIS — Z6834 Body mass index (BMI) 34.0-34.9, adult: Secondary | ICD-10-CM | POA: Diagnosis not present

## 2017-05-17 DIAGNOSIS — Z716 Tobacco abuse counseling: Secondary | ICD-10-CM | POA: Diagnosis not present

## 2017-05-17 DIAGNOSIS — E782 Mixed hyperlipidemia: Secondary | ICD-10-CM | POA: Diagnosis not present

## 2017-05-17 DIAGNOSIS — E669 Obesity, unspecified: Secondary | ICD-10-CM | POA: Diagnosis not present

## 2017-05-17 DIAGNOSIS — J449 Chronic obstructive pulmonary disease, unspecified: Secondary | ICD-10-CM | POA: Diagnosis not present

## 2017-05-17 DIAGNOSIS — R739 Hyperglycemia, unspecified: Secondary | ICD-10-CM | POA: Diagnosis not present

## 2017-05-17 DIAGNOSIS — E538 Deficiency of other specified B group vitamins: Secondary | ICD-10-CM | POA: Diagnosis not present

## 2017-05-17 DIAGNOSIS — E559 Vitamin D deficiency, unspecified: Secondary | ICD-10-CM | POA: Diagnosis not present

## 2017-05-17 DIAGNOSIS — I10 Essential (primary) hypertension: Secondary | ICD-10-CM | POA: Diagnosis not present

## 2017-06-07 DIAGNOSIS — R0902 Hypoxemia: Secondary | ICD-10-CM | POA: Diagnosis not present

## 2017-06-07 DIAGNOSIS — R0603 Acute respiratory distress: Secondary | ICD-10-CM | POA: Diagnosis not present

## 2017-06-07 DIAGNOSIS — M109 Gout, unspecified: Secondary | ICD-10-CM | POA: Diagnosis not present

## 2017-06-07 DIAGNOSIS — I959 Hypotension, unspecified: Secondary | ICD-10-CM | POA: Diagnosis not present

## 2017-06-07 DIAGNOSIS — N179 Acute kidney failure, unspecified: Secondary | ICD-10-CM | POA: Diagnosis not present

## 2017-06-07 DIAGNOSIS — R0602 Shortness of breath: Secondary | ICD-10-CM | POA: Diagnosis not present

## 2017-06-07 DIAGNOSIS — J189 Pneumonia, unspecified organism: Secondary | ICD-10-CM | POA: Diagnosis not present

## 2017-06-07 DIAGNOSIS — M25531 Pain in right wrist: Secondary | ICD-10-CM | POA: Diagnosis not present

## 2017-06-07 DIAGNOSIS — J440 Chronic obstructive pulmonary disease with acute lower respiratory infection: Secondary | ICD-10-CM | POA: Diagnosis not present

## 2017-06-07 DIAGNOSIS — K219 Gastro-esophageal reflux disease without esophagitis: Secondary | ICD-10-CM | POA: Diagnosis not present

## 2017-06-07 DIAGNOSIS — R079 Chest pain, unspecified: Secondary | ICD-10-CM | POA: Diagnosis not present

## 2017-06-07 DIAGNOSIS — Z7982 Long term (current) use of aspirin: Secondary | ICD-10-CM | POA: Diagnosis not present

## 2017-06-07 DIAGNOSIS — M069 Rheumatoid arthritis, unspecified: Secondary | ICD-10-CM | POA: Diagnosis not present

## 2017-06-08 DIAGNOSIS — R0602 Shortness of breath: Secondary | ICD-10-CM | POA: Diagnosis not present

## 2017-06-08 DIAGNOSIS — R0603 Acute respiratory distress: Secondary | ICD-10-CM | POA: Diagnosis not present

## 2017-06-08 DIAGNOSIS — M069 Rheumatoid arthritis, unspecified: Secondary | ICD-10-CM | POA: Diagnosis not present

## 2017-06-08 DIAGNOSIS — R0902 Hypoxemia: Secondary | ICD-10-CM | POA: Diagnosis not present

## 2017-06-08 DIAGNOSIS — J189 Pneumonia, unspecified organism: Secondary | ICD-10-CM | POA: Diagnosis not present

## 2017-06-08 DIAGNOSIS — Z7982 Long term (current) use of aspirin: Secondary | ICD-10-CM | POA: Diagnosis not present

## 2017-06-08 DIAGNOSIS — J440 Chronic obstructive pulmonary disease with acute lower respiratory infection: Secondary | ICD-10-CM | POA: Diagnosis not present

## 2017-06-08 DIAGNOSIS — K219 Gastro-esophageal reflux disease without esophagitis: Secondary | ICD-10-CM | POA: Diagnosis not present

## 2017-06-08 DIAGNOSIS — I959 Hypotension, unspecified: Secondary | ICD-10-CM | POA: Diagnosis not present

## 2017-06-08 DIAGNOSIS — I700 Atherosclerosis of aorta: Secondary | ICD-10-CM | POA: Diagnosis not present

## 2017-06-08 DIAGNOSIS — M109 Gout, unspecified: Secondary | ICD-10-CM | POA: Diagnosis not present

## 2017-06-08 DIAGNOSIS — N179 Acute kidney failure, unspecified: Secondary | ICD-10-CM | POA: Diagnosis not present

## 2017-06-09 DIAGNOSIS — M109 Gout, unspecified: Secondary | ICD-10-CM | POA: Diagnosis not present

## 2017-06-09 DIAGNOSIS — M069 Rheumatoid arthritis, unspecified: Secondary | ICD-10-CM | POA: Diagnosis not present

## 2017-06-09 DIAGNOSIS — R0902 Hypoxemia: Secondary | ICD-10-CM | POA: Diagnosis not present

## 2017-06-09 DIAGNOSIS — J440 Chronic obstructive pulmonary disease with acute lower respiratory infection: Secondary | ICD-10-CM | POA: Diagnosis not present

## 2017-06-09 DIAGNOSIS — I959 Hypotension, unspecified: Secondary | ICD-10-CM | POA: Diagnosis not present

## 2017-06-09 DIAGNOSIS — J189 Pneumonia, unspecified organism: Secondary | ICD-10-CM | POA: Diagnosis not present

## 2017-06-09 DIAGNOSIS — R0603 Acute respiratory distress: Secondary | ICD-10-CM | POA: Diagnosis not present

## 2017-06-09 DIAGNOSIS — N179 Acute kidney failure, unspecified: Secondary | ICD-10-CM | POA: Diagnosis not present

## 2017-06-10 DIAGNOSIS — J189 Pneumonia, unspecified organism: Secondary | ICD-10-CM | POA: Diagnosis not present

## 2017-06-10 DIAGNOSIS — I959 Hypotension, unspecified: Secondary | ICD-10-CM | POA: Diagnosis not present

## 2017-06-10 DIAGNOSIS — N179 Acute kidney failure, unspecified: Secondary | ICD-10-CM | POA: Diagnosis not present

## 2017-06-10 DIAGNOSIS — M109 Gout, unspecified: Secondary | ICD-10-CM | POA: Diagnosis not present

## 2017-06-10 DIAGNOSIS — R0603 Acute respiratory distress: Secondary | ICD-10-CM | POA: Diagnosis not present

## 2017-06-10 DIAGNOSIS — R0902 Hypoxemia: Secondary | ICD-10-CM | POA: Diagnosis not present

## 2017-06-10 DIAGNOSIS — J440 Chronic obstructive pulmonary disease with acute lower respiratory infection: Secondary | ICD-10-CM | POA: Diagnosis not present

## 2017-06-10 DIAGNOSIS — M069 Rheumatoid arthritis, unspecified: Secondary | ICD-10-CM | POA: Diagnosis not present

## 2017-06-12 DIAGNOSIS — J449 Chronic obstructive pulmonary disease, unspecified: Secondary | ICD-10-CM | POA: Diagnosis not present

## 2017-07-26 DIAGNOSIS — J449 Chronic obstructive pulmonary disease, unspecified: Secondary | ICD-10-CM | POA: Diagnosis not present

## 2017-07-27 DIAGNOSIS — J449 Chronic obstructive pulmonary disease, unspecified: Secondary | ICD-10-CM | POA: Diagnosis not present

## 2017-07-27 DIAGNOSIS — I10 Essential (primary) hypertension: Secondary | ICD-10-CM | POA: Diagnosis not present

## 2017-07-27 DIAGNOSIS — K08409 Partial loss of teeth, unspecified cause, unspecified class: Secondary | ICD-10-CM | POA: Diagnosis not present

## 2017-07-27 DIAGNOSIS — Z720 Tobacco use: Secondary | ICD-10-CM | POA: Diagnosis not present

## 2017-07-27 DIAGNOSIS — E785 Hyperlipidemia, unspecified: Secondary | ICD-10-CM | POA: Diagnosis not present

## 2017-07-27 DIAGNOSIS — Z7982 Long term (current) use of aspirin: Secondary | ICD-10-CM | POA: Diagnosis not present

## 2017-07-27 DIAGNOSIS — Z6834 Body mass index (BMI) 34.0-34.9, adult: Secondary | ICD-10-CM | POA: Diagnosis not present

## 2017-07-27 DIAGNOSIS — E538 Deficiency of other specified B group vitamins: Secondary | ICD-10-CM | POA: Diagnosis not present

## 2017-07-27 DIAGNOSIS — E559 Vitamin D deficiency, unspecified: Secondary | ICD-10-CM | POA: Diagnosis not present

## 2017-07-27 DIAGNOSIS — E669 Obesity, unspecified: Secondary | ICD-10-CM | POA: Diagnosis not present

## 2017-08-22 DIAGNOSIS — J449 Chronic obstructive pulmonary disease, unspecified: Secondary | ICD-10-CM | POA: Diagnosis not present

## 2017-08-23 DIAGNOSIS — E559 Vitamin D deficiency, unspecified: Secondary | ICD-10-CM | POA: Diagnosis not present

## 2017-08-23 DIAGNOSIS — Z23 Encounter for immunization: Secondary | ICD-10-CM | POA: Diagnosis not present

## 2017-08-23 DIAGNOSIS — Z1339 Encounter for screening examination for other mental health and behavioral disorders: Secondary | ICD-10-CM | POA: Diagnosis not present

## 2017-08-23 DIAGNOSIS — Z716 Tobacco abuse counseling: Secondary | ICD-10-CM | POA: Diagnosis not present

## 2017-08-23 DIAGNOSIS — Z125 Encounter for screening for malignant neoplasm of prostate: Secondary | ICD-10-CM | POA: Diagnosis not present

## 2017-08-23 DIAGNOSIS — R739 Hyperglycemia, unspecified: Secondary | ICD-10-CM | POA: Diagnosis not present

## 2017-08-23 DIAGNOSIS — Z79899 Other long term (current) drug therapy: Secondary | ICD-10-CM | POA: Diagnosis not present

## 2017-08-23 DIAGNOSIS — Z1331 Encounter for screening for depression: Secondary | ICD-10-CM | POA: Diagnosis not present

## 2017-08-23 DIAGNOSIS — M069 Rheumatoid arthritis, unspecified: Secondary | ICD-10-CM | POA: Diagnosis not present

## 2017-08-23 DIAGNOSIS — E782 Mixed hyperlipidemia: Secondary | ICD-10-CM | POA: Diagnosis not present

## 2017-08-23 DIAGNOSIS — Z9181 History of falling: Secondary | ICD-10-CM | POA: Diagnosis not present

## 2017-09-06 DIAGNOSIS — J9601 Acute respiratory failure with hypoxia: Secondary | ICD-10-CM | POA: Diagnosis not present

## 2017-09-06 DIAGNOSIS — J441 Chronic obstructive pulmonary disease with (acute) exacerbation: Secondary | ICD-10-CM | POA: Diagnosis not present

## 2017-09-20 DIAGNOSIS — J449 Chronic obstructive pulmonary disease, unspecified: Secondary | ICD-10-CM | POA: Diagnosis not present

## 2017-10-07 DIAGNOSIS — J9601 Acute respiratory failure with hypoxia: Secondary | ICD-10-CM | POA: Diagnosis not present

## 2017-10-07 DIAGNOSIS — J441 Chronic obstructive pulmonary disease with (acute) exacerbation: Secondary | ICD-10-CM | POA: Diagnosis not present

## 2017-10-17 DIAGNOSIS — J449 Chronic obstructive pulmonary disease, unspecified: Secondary | ICD-10-CM | POA: Diagnosis not present

## 2017-11-07 DIAGNOSIS — J441 Chronic obstructive pulmonary disease with (acute) exacerbation: Secondary | ICD-10-CM | POA: Diagnosis not present

## 2017-11-07 DIAGNOSIS — J9601 Acute respiratory failure with hypoxia: Secondary | ICD-10-CM | POA: Diagnosis not present

## 2017-11-29 DIAGNOSIS — J449 Chronic obstructive pulmonary disease, unspecified: Secondary | ICD-10-CM | POA: Diagnosis not present

## 2017-11-29 DIAGNOSIS — J9610 Chronic respiratory failure, unspecified whether with hypoxia or hypercapnia: Secondary | ICD-10-CM | POA: Diagnosis not present

## 2017-11-29 DIAGNOSIS — I10 Essential (primary) hypertension: Secondary | ICD-10-CM | POA: Diagnosis not present

## 2017-11-29 DIAGNOSIS — R739 Hyperglycemia, unspecified: Secondary | ICD-10-CM | POA: Diagnosis not present

## 2017-11-29 DIAGNOSIS — Z23 Encounter for immunization: Secondary | ICD-10-CM | POA: Diagnosis not present

## 2017-11-29 DIAGNOSIS — Z6834 Body mass index (BMI) 34.0-34.9, adult: Secondary | ICD-10-CM | POA: Diagnosis not present

## 2017-11-29 DIAGNOSIS — M069 Rheumatoid arthritis, unspecified: Secondary | ICD-10-CM | POA: Diagnosis not present

## 2017-11-29 DIAGNOSIS — E782 Mixed hyperlipidemia: Secondary | ICD-10-CM | POA: Diagnosis not present

## 2017-11-29 DIAGNOSIS — M25511 Pain in right shoulder: Secondary | ICD-10-CM | POA: Diagnosis not present

## 2017-12-07 DIAGNOSIS — J9601 Acute respiratory failure with hypoxia: Secondary | ICD-10-CM | POA: Diagnosis not present

## 2017-12-07 DIAGNOSIS — J441 Chronic obstructive pulmonary disease with (acute) exacerbation: Secondary | ICD-10-CM | POA: Diagnosis not present

## 2017-12-12 DIAGNOSIS — J449 Chronic obstructive pulmonary disease, unspecified: Secondary | ICD-10-CM | POA: Diagnosis not present

## 2018-01-07 DIAGNOSIS — J441 Chronic obstructive pulmonary disease with (acute) exacerbation: Secondary | ICD-10-CM | POA: Diagnosis not present

## 2018-01-07 DIAGNOSIS — J9601 Acute respiratory failure with hypoxia: Secondary | ICD-10-CM | POA: Diagnosis not present

## 2018-02-04 DIAGNOSIS — J449 Chronic obstructive pulmonary disease, unspecified: Secondary | ICD-10-CM | POA: Diagnosis not present

## 2018-02-06 DIAGNOSIS — J441 Chronic obstructive pulmonary disease with (acute) exacerbation: Secondary | ICD-10-CM | POA: Diagnosis not present

## 2018-02-06 DIAGNOSIS — J9601 Acute respiratory failure with hypoxia: Secondary | ICD-10-CM | POA: Diagnosis not present

## 2018-02-27 DIAGNOSIS — R05 Cough: Secondary | ICD-10-CM | POA: Diagnosis not present

## 2018-02-27 DIAGNOSIS — R0602 Shortness of breath: Secondary | ICD-10-CM | POA: Diagnosis not present

## 2018-02-27 DIAGNOSIS — J441 Chronic obstructive pulmonary disease with (acute) exacerbation: Secondary | ICD-10-CM | POA: Diagnosis not present

## 2018-02-28 DIAGNOSIS — R7989 Other specified abnormal findings of blood chemistry: Secondary | ICD-10-CM | POA: Diagnosis not present

## 2018-02-28 DIAGNOSIS — E785 Hyperlipidemia, unspecified: Secondary | ICD-10-CM | POA: Diagnosis not present

## 2018-02-28 DIAGNOSIS — E860 Dehydration: Secondary | ICD-10-CM | POA: Diagnosis not present

## 2018-02-28 DIAGNOSIS — D72829 Elevated white blood cell count, unspecified: Secondary | ICD-10-CM | POA: Diagnosis not present

## 2018-02-28 DIAGNOSIS — J9621 Acute and chronic respiratory failure with hypoxia: Secondary | ICD-10-CM | POA: Diagnosis not present

## 2018-02-28 DIAGNOSIS — R0602 Shortness of breath: Secondary | ICD-10-CM | POA: Diagnosis not present

## 2018-02-28 DIAGNOSIS — K219 Gastro-esophageal reflux disease without esophagitis: Secondary | ICD-10-CM | POA: Diagnosis not present

## 2018-02-28 DIAGNOSIS — I10 Essential (primary) hypertension: Secondary | ICD-10-CM | POA: Diagnosis not present

## 2018-02-28 DIAGNOSIS — R05 Cough: Secondary | ICD-10-CM | POA: Diagnosis not present

## 2018-02-28 DIAGNOSIS — M069 Rheumatoid arthritis, unspecified: Secondary | ICD-10-CM | POA: Diagnosis not present

## 2018-02-28 DIAGNOSIS — R69 Illness, unspecified: Secondary | ICD-10-CM | POA: Diagnosis not present

## 2018-02-28 DIAGNOSIS — J441 Chronic obstructive pulmonary disease with (acute) exacerbation: Secondary | ICD-10-CM | POA: Diagnosis not present

## 2018-02-28 DIAGNOSIS — Z720 Tobacco use: Secondary | ICD-10-CM | POA: Diagnosis not present

## 2018-03-01 DIAGNOSIS — M069 Rheumatoid arthritis, unspecified: Secondary | ICD-10-CM

## 2018-03-01 DIAGNOSIS — R7989 Other specified abnormal findings of blood chemistry: Secondary | ICD-10-CM

## 2018-03-01 DIAGNOSIS — K219 Gastro-esophageal reflux disease without esophagitis: Secondary | ICD-10-CM

## 2018-03-01 DIAGNOSIS — I10 Essential (primary) hypertension: Secondary | ICD-10-CM

## 2018-03-05 ENCOUNTER — Other Ambulatory Visit: Payer: Self-pay

## 2018-03-05 NOTE — Patient Outreach (Signed)
New referral from [facility]: Discharge date of 03/03/2018. Primary MD: Dr. Samuel Germany  ( office does transition of care).  Placed call to patient and explained reason for call. Explain [facility] services. Patient has accepted services. Patient reports that he is doing better since he was discharged home from hospital. Reports that he has all his medications and is taking his medications as prescribed.    Offered home visit to assess needs and patient has agreed. Home visit planned for 03/11/2018 ay 2:45. Confirmed address.  PLAN: initial home visit on 03/11/2018.  Rowe Pavy, RN, BSN, CEN [facility] [employer] [phone]

## 2018-03-09 DIAGNOSIS — J441 Chronic obstructive pulmonary disease with (acute) exacerbation: Secondary | ICD-10-CM | POA: Diagnosis not present

## 2018-03-09 DIAGNOSIS — J9601 Acute respiratory failure with hypoxia: Secondary | ICD-10-CM | POA: Diagnosis not present

## 2018-03-11 ENCOUNTER — Other Ambulatory Visit: Payer: Self-pay

## 2018-03-11 NOTE — Patient Outreach (Signed)
[facility]) Care Management  03/11/2018  SADRAC SALATA 05-16-1955 [id number]  2:45pm    Arrived for initial home visit. Patient sitting in trailer filled with smoke. Many family members smoking.  Reviewed with patient reason for referral. Explained [facility] program and provided new patient packet. After explanation of program, patient reports that he is doing well and feels like he does not need case management.  Reviewed with patient benefits of [facility] case management and patient reports that he has all his medications, "sees his primary MD every 3 months", reports that he has transportation and knows how to take care of his COPD.  Reviewed importance of following COPD zones and follow up with MD after discharged from hospital. Reviewed smoking cessation and patient reports that he is not interested in quitting smoking.   PLAN: left a [facility] welcome packet, my contact card, COPD packet and [facility] calendar. Encouraged patient to call me if he changes his mind. Encouraged patient to make a follow up appointment with Dr. Samuel Germany.  Will close as patient declines needs.  Will send MD update.  Rowe Pavy, RN, BSN, CEN [facility] [employer] [phone]

## 2018-03-12 DIAGNOSIS — J449 Chronic obstructive pulmonary disease, unspecified: Secondary | ICD-10-CM | POA: Diagnosis not present

## 2018-03-21 DIAGNOSIS — M069 Rheumatoid arthritis, unspecified: Secondary | ICD-10-CM | POA: Diagnosis not present

## 2018-03-21 DIAGNOSIS — E782 Mixed hyperlipidemia: Secondary | ICD-10-CM | POA: Diagnosis not present

## 2018-03-21 DIAGNOSIS — Z716 Tobacco abuse counseling: Secondary | ICD-10-CM | POA: Diagnosis not present

## 2018-03-21 DIAGNOSIS — Z6835 Body mass index (BMI) 35.0-35.9, adult: Secondary | ICD-10-CM | POA: Diagnosis not present

## 2018-03-21 DIAGNOSIS — I10 Essential (primary) hypertension: Secondary | ICD-10-CM | POA: Diagnosis not present

## 2018-03-21 DIAGNOSIS — J449 Chronic obstructive pulmonary disease, unspecified: Secondary | ICD-10-CM | POA: Diagnosis not present

## 2018-03-21 DIAGNOSIS — E559 Vitamin D deficiency, unspecified: Secondary | ICD-10-CM | POA: Diagnosis not present

## 2018-03-21 DIAGNOSIS — R739 Hyperglycemia, unspecified: Secondary | ICD-10-CM | POA: Diagnosis not present

## 2018-03-21 DIAGNOSIS — E538 Deficiency of other specified B group vitamins: Secondary | ICD-10-CM | POA: Diagnosis not present

## 2018-04-09 DIAGNOSIS — J441 Chronic obstructive pulmonary disease with (acute) exacerbation: Secondary | ICD-10-CM | POA: Diagnosis not present

## 2018-04-09 DIAGNOSIS — J9601 Acute respiratory failure with hypoxia: Secondary | ICD-10-CM | POA: Diagnosis not present

## 2018-04-15 DIAGNOSIS — J449 Chronic obstructive pulmonary disease, unspecified: Secondary | ICD-10-CM | POA: Diagnosis not present

## 2018-05-08 DIAGNOSIS — J441 Chronic obstructive pulmonary disease with (acute) exacerbation: Secondary | ICD-10-CM | POA: Diagnosis not present

## 2018-05-08 DIAGNOSIS — J9601 Acute respiratory failure with hypoxia: Secondary | ICD-10-CM | POA: Diagnosis not present

## 2018-05-09 DIAGNOSIS — J449 Chronic obstructive pulmonary disease, unspecified: Secondary | ICD-10-CM | POA: Diagnosis not present

## 2018-06-03 DIAGNOSIS — J449 Chronic obstructive pulmonary disease, unspecified: Secondary | ICD-10-CM | POA: Diagnosis not present

## 2018-06-04 DIAGNOSIS — J449 Chronic obstructive pulmonary disease, unspecified: Secondary | ICD-10-CM | POA: Diagnosis not present

## 2018-06-08 DIAGNOSIS — J9601 Acute respiratory failure with hypoxia: Secondary | ICD-10-CM | POA: Diagnosis not present

## 2018-06-08 DIAGNOSIS — J441 Chronic obstructive pulmonary disease with (acute) exacerbation: Secondary | ICD-10-CM | POA: Diagnosis not present

## 2018-06-27 DIAGNOSIS — Z716 Tobacco abuse counseling: Secondary | ICD-10-CM | POA: Diagnosis not present

## 2018-06-27 DIAGNOSIS — E559 Vitamin D deficiency, unspecified: Secondary | ICD-10-CM | POA: Diagnosis not present

## 2018-06-27 DIAGNOSIS — E782 Mixed hyperlipidemia: Secondary | ICD-10-CM | POA: Diagnosis not present

## 2018-06-27 DIAGNOSIS — I10 Essential (primary) hypertension: Secondary | ICD-10-CM | POA: Diagnosis not present

## 2018-06-27 DIAGNOSIS — J432 Centrilobular emphysema: Secondary | ICD-10-CM | POA: Diagnosis not present

## 2018-06-27 DIAGNOSIS — R739 Hyperglycemia, unspecified: Secondary | ICD-10-CM | POA: Diagnosis not present

## 2018-06-27 DIAGNOSIS — J449 Chronic obstructive pulmonary disease, unspecified: Secondary | ICD-10-CM | POA: Diagnosis not present

## 2018-06-27 DIAGNOSIS — E538 Deficiency of other specified B group vitamins: Secondary | ICD-10-CM | POA: Diagnosis not present

## 2018-06-27 DIAGNOSIS — M069 Rheumatoid arthritis, unspecified: Secondary | ICD-10-CM | POA: Diagnosis not present

## 2018-07-08 DIAGNOSIS — J9601 Acute respiratory failure with hypoxia: Secondary | ICD-10-CM | POA: Diagnosis not present

## 2018-07-08 DIAGNOSIS — J441 Chronic obstructive pulmonary disease with (acute) exacerbation: Secondary | ICD-10-CM | POA: Diagnosis not present

## 2018-07-24 DIAGNOSIS — J449 Chronic obstructive pulmonary disease, unspecified: Secondary | ICD-10-CM | POA: Diagnosis not present

## 2018-08-08 DIAGNOSIS — J9601 Acute respiratory failure with hypoxia: Secondary | ICD-10-CM | POA: Diagnosis not present

## 2018-08-08 DIAGNOSIS — J441 Chronic obstructive pulmonary disease with (acute) exacerbation: Secondary | ICD-10-CM | POA: Diagnosis not present

## 2018-08-19 DIAGNOSIS — J449 Chronic obstructive pulmonary disease, unspecified: Secondary | ICD-10-CM | POA: Diagnosis not present

## 2018-09-07 DIAGNOSIS — J441 Chronic obstructive pulmonary disease with (acute) exacerbation: Secondary | ICD-10-CM | POA: Diagnosis not present

## 2018-09-07 DIAGNOSIS — J9601 Acute respiratory failure with hypoxia: Secondary | ICD-10-CM | POA: Diagnosis not present

## 2018-09-12 DIAGNOSIS — J449 Chronic obstructive pulmonary disease, unspecified: Secondary | ICD-10-CM | POA: Diagnosis not present

## 2018-10-03 DIAGNOSIS — Z716 Tobacco abuse counseling: Secondary | ICD-10-CM | POA: Diagnosis not present

## 2018-10-03 DIAGNOSIS — E559 Vitamin D deficiency, unspecified: Secondary | ICD-10-CM | POA: Diagnosis not present

## 2018-10-03 DIAGNOSIS — E782 Mixed hyperlipidemia: Secondary | ICD-10-CM | POA: Diagnosis not present

## 2018-10-03 DIAGNOSIS — I10 Essential (primary) hypertension: Secondary | ICD-10-CM | POA: Diagnosis not present

## 2018-10-03 DIAGNOSIS — R739 Hyperglycemia, unspecified: Secondary | ICD-10-CM | POA: Diagnosis not present

## 2018-10-03 DIAGNOSIS — E538 Deficiency of other specified B group vitamins: Secondary | ICD-10-CM | POA: Diagnosis not present

## 2018-10-03 DIAGNOSIS — M069 Rheumatoid arthritis, unspecified: Secondary | ICD-10-CM | POA: Diagnosis not present

## 2018-10-03 DIAGNOSIS — J449 Chronic obstructive pulmonary disease, unspecified: Secondary | ICD-10-CM | POA: Diagnosis not present

## 2018-10-08 DIAGNOSIS — J449 Chronic obstructive pulmonary disease, unspecified: Secondary | ICD-10-CM | POA: Diagnosis not present

## 2018-10-08 DIAGNOSIS — J441 Chronic obstructive pulmonary disease with (acute) exacerbation: Secondary | ICD-10-CM | POA: Diagnosis not present

## 2018-10-08 DIAGNOSIS — J9601 Acute respiratory failure with hypoxia: Secondary | ICD-10-CM | POA: Diagnosis not present

## 2018-11-01 DIAGNOSIS — B356 Tinea cruris: Secondary | ICD-10-CM | POA: Diagnosis not present

## 2018-11-01 DIAGNOSIS — R739 Hyperglycemia, unspecified: Secondary | ICD-10-CM | POA: Diagnosis not present

## 2018-11-01 DIAGNOSIS — Z125 Encounter for screening for malignant neoplasm of prostate: Secondary | ICD-10-CM | POA: Diagnosis not present

## 2018-11-01 DIAGNOSIS — M25551 Pain in right hip: Secondary | ICD-10-CM | POA: Diagnosis not present

## 2018-11-01 DIAGNOSIS — E669 Obesity, unspecified: Secondary | ICD-10-CM | POA: Diagnosis not present

## 2018-11-01 DIAGNOSIS — M069 Rheumatoid arthritis, unspecified: Secondary | ICD-10-CM | POA: Diagnosis not present

## 2018-11-01 DIAGNOSIS — I10 Essential (primary) hypertension: Secondary | ICD-10-CM | POA: Diagnosis not present

## 2018-11-01 DIAGNOSIS — J449 Chronic obstructive pulmonary disease, unspecified: Secondary | ICD-10-CM | POA: Diagnosis not present

## 2018-11-01 DIAGNOSIS — E782 Mixed hyperlipidemia: Secondary | ICD-10-CM | POA: Diagnosis not present

## 2018-11-01 DIAGNOSIS — E538 Deficiency of other specified B group vitamins: Secondary | ICD-10-CM | POA: Diagnosis not present

## 2018-11-01 DIAGNOSIS — E559 Vitamin D deficiency, unspecified: Secondary | ICD-10-CM | POA: Diagnosis not present

## 2018-11-04 DIAGNOSIS — J449 Chronic obstructive pulmonary disease, unspecified: Secondary | ICD-10-CM | POA: Diagnosis not present

## 2018-11-08 DIAGNOSIS — J441 Chronic obstructive pulmonary disease with (acute) exacerbation: Secondary | ICD-10-CM | POA: Diagnosis not present

## 2018-11-08 DIAGNOSIS — J9601 Acute respiratory failure with hypoxia: Secondary | ICD-10-CM | POA: Diagnosis not present

## 2018-12-03 DIAGNOSIS — J449 Chronic obstructive pulmonary disease, unspecified: Secondary | ICD-10-CM | POA: Diagnosis not present

## 2018-12-08 DIAGNOSIS — J9601 Acute respiratory failure with hypoxia: Secondary | ICD-10-CM | POA: Diagnosis not present

## 2018-12-08 DIAGNOSIS — J441 Chronic obstructive pulmonary disease with (acute) exacerbation: Secondary | ICD-10-CM | POA: Diagnosis not present

## 2019-01-08 DIAGNOSIS — J9601 Acute respiratory failure with hypoxia: Secondary | ICD-10-CM | POA: Diagnosis not present

## 2019-01-08 DIAGNOSIS — J441 Chronic obstructive pulmonary disease with (acute) exacerbation: Secondary | ICD-10-CM | POA: Diagnosis not present

## 2019-01-13 DIAGNOSIS — J449 Chronic obstructive pulmonary disease, unspecified: Secondary | ICD-10-CM | POA: Diagnosis not present

## 2019-02-04 DIAGNOSIS — I10 Essential (primary) hypertension: Secondary | ICD-10-CM | POA: Diagnosis not present

## 2019-02-04 DIAGNOSIS — Z9181 History of falling: Secondary | ICD-10-CM | POA: Diagnosis not present

## 2019-02-04 DIAGNOSIS — R739 Hyperglycemia, unspecified: Secondary | ICD-10-CM | POA: Diagnosis not present

## 2019-02-04 DIAGNOSIS — Z1331 Encounter for screening for depression: Secondary | ICD-10-CM | POA: Diagnosis not present

## 2019-02-04 DIAGNOSIS — N492 Inflammatory disorders of scrotum: Secondary | ICD-10-CM | POA: Diagnosis not present

## 2019-02-04 DIAGNOSIS — E538 Deficiency of other specified B group vitamins: Secondary | ICD-10-CM | POA: Diagnosis not present

## 2019-02-04 DIAGNOSIS — E559 Vitamin D deficiency, unspecified: Secondary | ICD-10-CM | POA: Diagnosis not present

## 2019-02-04 DIAGNOSIS — Z23 Encounter for immunization: Secondary | ICD-10-CM | POA: Diagnosis not present

## 2019-02-04 DIAGNOSIS — E782 Mixed hyperlipidemia: Secondary | ICD-10-CM | POA: Diagnosis not present

## 2019-02-04 DIAGNOSIS — J449 Chronic obstructive pulmonary disease, unspecified: Secondary | ICD-10-CM | POA: Diagnosis not present

## 2019-02-07 DIAGNOSIS — J441 Chronic obstructive pulmonary disease with (acute) exacerbation: Secondary | ICD-10-CM | POA: Diagnosis not present

## 2019-02-07 DIAGNOSIS — J9601 Acute respiratory failure with hypoxia: Secondary | ICD-10-CM | POA: Diagnosis not present

## 2019-02-10 DIAGNOSIS — N401 Enlarged prostate with lower urinary tract symptoms: Secondary | ICD-10-CM | POA: Diagnosis not present

## 2019-02-10 DIAGNOSIS — N5089 Other specified disorders of the male genital organs: Secondary | ICD-10-CM | POA: Diagnosis not present

## 2019-02-10 DIAGNOSIS — R21 Rash and other nonspecific skin eruption: Secondary | ICD-10-CM | POA: Diagnosis not present

## 2019-02-13 DIAGNOSIS — J449 Chronic obstructive pulmonary disease, unspecified: Secondary | ICD-10-CM | POA: Diagnosis not present

## 2019-03-10 DIAGNOSIS — J441 Chronic obstructive pulmonary disease with (acute) exacerbation: Secondary | ICD-10-CM | POA: Diagnosis not present

## 2019-03-10 DIAGNOSIS — J9601 Acute respiratory failure with hypoxia: Secondary | ICD-10-CM | POA: Diagnosis not present

## 2019-03-19 DIAGNOSIS — N401 Enlarged prostate with lower urinary tract symptoms: Secondary | ICD-10-CM | POA: Diagnosis not present

## 2019-03-19 DIAGNOSIS — N5089 Other specified disorders of the male genital organs: Secondary | ICD-10-CM | POA: Diagnosis not present

## 2019-04-01 DIAGNOSIS — J449 Chronic obstructive pulmonary disease, unspecified: Secondary | ICD-10-CM | POA: Diagnosis not present

## 2019-04-10 DIAGNOSIS — J441 Chronic obstructive pulmonary disease with (acute) exacerbation: Secondary | ICD-10-CM | POA: Diagnosis not present

## 2019-04-10 DIAGNOSIS — J9601 Acute respiratory failure with hypoxia: Secondary | ICD-10-CM | POA: Diagnosis not present

## 2019-05-06 DIAGNOSIS — J449 Chronic obstructive pulmonary disease, unspecified: Secondary | ICD-10-CM | POA: Diagnosis not present

## 2019-05-08 DIAGNOSIS — J9601 Acute respiratory failure with hypoxia: Secondary | ICD-10-CM | POA: Diagnosis not present

## 2019-05-08 DIAGNOSIS — N5089 Other specified disorders of the male genital organs: Secondary | ICD-10-CM | POA: Diagnosis not present

## 2019-05-08 DIAGNOSIS — J441 Chronic obstructive pulmonary disease with (acute) exacerbation: Secondary | ICD-10-CM | POA: Diagnosis not present

## 2019-05-08 DIAGNOSIS — R21 Rash and other nonspecific skin eruption: Secondary | ICD-10-CM | POA: Diagnosis not present

## 2019-05-08 DIAGNOSIS — N401 Enlarged prostate with lower urinary tract symptoms: Secondary | ICD-10-CM | POA: Diagnosis not present

## 2019-05-09 DIAGNOSIS — M069 Rheumatoid arthritis, unspecified: Secondary | ICD-10-CM | POA: Diagnosis not present

## 2019-05-09 DIAGNOSIS — E559 Vitamin D deficiency, unspecified: Secondary | ICD-10-CM | POA: Diagnosis not present

## 2019-05-09 DIAGNOSIS — Z6836 Body mass index (BMI) 36.0-36.9, adult: Secondary | ICD-10-CM | POA: Diagnosis not present

## 2019-05-09 DIAGNOSIS — Z139 Encounter for screening, unspecified: Secondary | ICD-10-CM | POA: Diagnosis not present

## 2019-05-09 DIAGNOSIS — J449 Chronic obstructive pulmonary disease, unspecified: Secondary | ICD-10-CM | POA: Diagnosis not present

## 2019-05-09 DIAGNOSIS — M25551 Pain in right hip: Secondary | ICD-10-CM | POA: Diagnosis not present

## 2019-05-09 DIAGNOSIS — R739 Hyperglycemia, unspecified: Secondary | ICD-10-CM | POA: Diagnosis not present

## 2019-05-09 DIAGNOSIS — E782 Mixed hyperlipidemia: Secondary | ICD-10-CM | POA: Diagnosis not present

## 2019-05-09 DIAGNOSIS — E538 Deficiency of other specified B group vitamins: Secondary | ICD-10-CM | POA: Diagnosis not present

## 2019-06-08 DIAGNOSIS — J441 Chronic obstructive pulmonary disease with (acute) exacerbation: Secondary | ICD-10-CM | POA: Diagnosis not present

## 2019-06-08 DIAGNOSIS — J9601 Acute respiratory failure with hypoxia: Secondary | ICD-10-CM | POA: Diagnosis not present

## 2019-06-10 DIAGNOSIS — Z008 Encounter for other general examination: Secondary | ICD-10-CM | POA: Diagnosis not present

## 2019-06-10 DIAGNOSIS — I739 Peripheral vascular disease, unspecified: Secondary | ICD-10-CM | POA: Diagnosis not present

## 2019-06-10 DIAGNOSIS — M069 Rheumatoid arthritis, unspecified: Secondary | ICD-10-CM | POA: Diagnosis not present

## 2019-06-10 DIAGNOSIS — L309 Dermatitis, unspecified: Secondary | ICD-10-CM | POA: Diagnosis not present

## 2019-06-10 DIAGNOSIS — J449 Chronic obstructive pulmonary disease, unspecified: Secondary | ICD-10-CM | POA: Diagnosis not present

## 2019-06-10 DIAGNOSIS — D649 Anemia, unspecified: Secondary | ICD-10-CM | POA: Diagnosis not present

## 2019-06-10 DIAGNOSIS — E785 Hyperlipidemia, unspecified: Secondary | ICD-10-CM | POA: Diagnosis not present

## 2019-06-10 DIAGNOSIS — J9610 Chronic respiratory failure, unspecified whether with hypoxia or hypercapnia: Secondary | ICD-10-CM | POA: Diagnosis not present

## 2019-06-10 DIAGNOSIS — R69 Illness, unspecified: Secondary | ICD-10-CM | POA: Diagnosis not present

## 2019-06-10 DIAGNOSIS — I10 Essential (primary) hypertension: Secondary | ICD-10-CM | POA: Diagnosis not present

## 2019-06-23 DIAGNOSIS — J449 Chronic obstructive pulmonary disease, unspecified: Secondary | ICD-10-CM | POA: Diagnosis not present

## 2019-06-23 DIAGNOSIS — J441 Chronic obstructive pulmonary disease with (acute) exacerbation: Secondary | ICD-10-CM | POA: Diagnosis not present

## 2019-06-24 DIAGNOSIS — J449 Chronic obstructive pulmonary disease, unspecified: Secondary | ICD-10-CM | POA: Diagnosis not present

## 2019-06-24 DIAGNOSIS — J441 Chronic obstructive pulmonary disease with (acute) exacerbation: Secondary | ICD-10-CM | POA: Diagnosis not present

## 2019-07-02 DIAGNOSIS — N5089 Other specified disorders of the male genital organs: Secondary | ICD-10-CM | POA: Diagnosis not present

## 2019-07-02 DIAGNOSIS — R21 Rash and other nonspecific skin eruption: Secondary | ICD-10-CM | POA: Diagnosis not present

## 2019-07-02 DIAGNOSIS — N401 Enlarged prostate with lower urinary tract symptoms: Secondary | ICD-10-CM | POA: Diagnosis not present

## 2019-07-08 DIAGNOSIS — J9601 Acute respiratory failure with hypoxia: Secondary | ICD-10-CM | POA: Diagnosis not present

## 2019-07-08 DIAGNOSIS — J441 Chronic obstructive pulmonary disease with (acute) exacerbation: Secondary | ICD-10-CM | POA: Diagnosis not present

## 2019-07-29 DIAGNOSIS — J441 Chronic obstructive pulmonary disease with (acute) exacerbation: Secondary | ICD-10-CM | POA: Diagnosis not present

## 2019-07-29 DIAGNOSIS — J449 Chronic obstructive pulmonary disease, unspecified: Secondary | ICD-10-CM | POA: Diagnosis not present

## 2019-08-08 DIAGNOSIS — J441 Chronic obstructive pulmonary disease with (acute) exacerbation: Secondary | ICD-10-CM | POA: Diagnosis not present

## 2019-08-08 DIAGNOSIS — J9601 Acute respiratory failure with hypoxia: Secondary | ICD-10-CM | POA: Diagnosis not present

## 2019-08-11 DIAGNOSIS — R739 Hyperglycemia, unspecified: Secondary | ICD-10-CM | POA: Diagnosis not present

## 2019-08-11 DIAGNOSIS — M069 Rheumatoid arthritis, unspecified: Secondary | ICD-10-CM | POA: Diagnosis not present

## 2019-08-11 DIAGNOSIS — J449 Chronic obstructive pulmonary disease, unspecified: Secondary | ICD-10-CM | POA: Diagnosis not present

## 2019-08-11 DIAGNOSIS — Z6837 Body mass index (BMI) 37.0-37.9, adult: Secondary | ICD-10-CM | POA: Diagnosis not present

## 2019-08-11 DIAGNOSIS — E782 Mixed hyperlipidemia: Secondary | ICD-10-CM | POA: Diagnosis not present

## 2019-08-11 DIAGNOSIS — E559 Vitamin D deficiency, unspecified: Secondary | ICD-10-CM | POA: Diagnosis not present

## 2019-09-03 DIAGNOSIS — R21 Rash and other nonspecific skin eruption: Secondary | ICD-10-CM | POA: Diagnosis not present

## 2019-09-03 DIAGNOSIS — N401 Enlarged prostate with lower urinary tract symptoms: Secondary | ICD-10-CM | POA: Diagnosis not present

## 2019-09-03 DIAGNOSIS — J441 Chronic obstructive pulmonary disease with (acute) exacerbation: Secondary | ICD-10-CM | POA: Diagnosis not present

## 2019-09-03 DIAGNOSIS — N5089 Other specified disorders of the male genital organs: Secondary | ICD-10-CM | POA: Diagnosis not present

## 2019-09-03 DIAGNOSIS — J449 Chronic obstructive pulmonary disease, unspecified: Secondary | ICD-10-CM | POA: Diagnosis not present

## 2019-09-07 DIAGNOSIS — J9601 Acute respiratory failure with hypoxia: Secondary | ICD-10-CM | POA: Diagnosis not present

## 2019-09-07 DIAGNOSIS — J441 Chronic obstructive pulmonary disease with (acute) exacerbation: Secondary | ICD-10-CM | POA: Diagnosis not present

## 2019-10-06 DIAGNOSIS — J441 Chronic obstructive pulmonary disease with (acute) exacerbation: Secondary | ICD-10-CM | POA: Diagnosis not present

## 2019-10-06 DIAGNOSIS — J449 Chronic obstructive pulmonary disease, unspecified: Secondary | ICD-10-CM | POA: Diagnosis not present

## 2019-10-08 DIAGNOSIS — J9601 Acute respiratory failure with hypoxia: Secondary | ICD-10-CM | POA: Diagnosis not present

## 2019-10-08 DIAGNOSIS — J441 Chronic obstructive pulmonary disease with (acute) exacerbation: Secondary | ICD-10-CM | POA: Diagnosis not present

## 2019-10-13 DIAGNOSIS — R0602 Shortness of breath: Secondary | ICD-10-CM | POA: Diagnosis not present

## 2019-10-13 DIAGNOSIS — J209 Acute bronchitis, unspecified: Secondary | ICD-10-CM | POA: Diagnosis not present

## 2019-10-13 DIAGNOSIS — R05 Cough: Secondary | ICD-10-CM | POA: Diagnosis not present

## 2019-10-13 DIAGNOSIS — J441 Chronic obstructive pulmonary disease with (acute) exacerbation: Secondary | ICD-10-CM | POA: Diagnosis not present

## 2019-10-13 DIAGNOSIS — J45901 Unspecified asthma with (acute) exacerbation: Secondary | ICD-10-CM | POA: Diagnosis not present

## 2019-10-13 DIAGNOSIS — J440 Chronic obstructive pulmonary disease with acute lower respiratory infection: Secondary | ICD-10-CM | POA: Diagnosis not present

## 2019-10-13 DIAGNOSIS — R69 Illness, unspecified: Secondary | ICD-10-CM | POA: Diagnosis not present

## 2019-10-14 DIAGNOSIS — R69 Illness, unspecified: Secondary | ICD-10-CM | POA: Diagnosis not present

## 2019-11-08 DIAGNOSIS — J9601 Acute respiratory failure with hypoxia: Secondary | ICD-10-CM | POA: Diagnosis not present

## 2019-11-08 DIAGNOSIS — J441 Chronic obstructive pulmonary disease with (acute) exacerbation: Secondary | ICD-10-CM | POA: Diagnosis not present

## 2019-11-12 DIAGNOSIS — E559 Vitamin D deficiency, unspecified: Secondary | ICD-10-CM | POA: Diagnosis not present

## 2019-11-12 DIAGNOSIS — M069 Rheumatoid arthritis, unspecified: Secondary | ICD-10-CM | POA: Diagnosis not present

## 2019-11-12 DIAGNOSIS — Z6836 Body mass index (BMI) 36.0-36.9, adult: Secondary | ICD-10-CM | POA: Diagnosis not present

## 2019-11-12 DIAGNOSIS — J449 Chronic obstructive pulmonary disease, unspecified: Secondary | ICD-10-CM | POA: Diagnosis not present

## 2019-11-12 DIAGNOSIS — E782 Mixed hyperlipidemia: Secondary | ICD-10-CM | POA: Diagnosis not present

## 2019-11-12 DIAGNOSIS — R739 Hyperglycemia, unspecified: Secondary | ICD-10-CM | POA: Diagnosis not present

## 2019-11-12 DIAGNOSIS — Z125 Encounter for screening for malignant neoplasm of prostate: Secondary | ICD-10-CM | POA: Diagnosis not present

## 2019-11-20 DIAGNOSIS — J441 Chronic obstructive pulmonary disease with (acute) exacerbation: Secondary | ICD-10-CM | POA: Diagnosis not present

## 2019-11-20 DIAGNOSIS — J449 Chronic obstructive pulmonary disease, unspecified: Secondary | ICD-10-CM | POA: Diagnosis not present

## 2019-12-08 DIAGNOSIS — J9601 Acute respiratory failure with hypoxia: Secondary | ICD-10-CM | POA: Diagnosis not present

## 2019-12-08 DIAGNOSIS — J441 Chronic obstructive pulmonary disease with (acute) exacerbation: Secondary | ICD-10-CM | POA: Diagnosis not present

## 2019-12-15 DIAGNOSIS — J441 Chronic obstructive pulmonary disease with (acute) exacerbation: Secondary | ICD-10-CM | POA: Diagnosis not present

## 2019-12-15 DIAGNOSIS — J449 Chronic obstructive pulmonary disease, unspecified: Secondary | ICD-10-CM | POA: Diagnosis not present

## 2020-01-08 DIAGNOSIS — J441 Chronic obstructive pulmonary disease with (acute) exacerbation: Secondary | ICD-10-CM | POA: Diagnosis not present

## 2020-01-08 DIAGNOSIS — J9601 Acute respiratory failure with hypoxia: Secondary | ICD-10-CM | POA: Diagnosis not present

## 2020-01-13 DIAGNOSIS — J441 Chronic obstructive pulmonary disease with (acute) exacerbation: Secondary | ICD-10-CM | POA: Diagnosis not present

## 2020-01-13 DIAGNOSIS — J449 Chronic obstructive pulmonary disease, unspecified: Secondary | ICD-10-CM | POA: Diagnosis not present

## 2020-01-14 DIAGNOSIS — J441 Chronic obstructive pulmonary disease with (acute) exacerbation: Secondary | ICD-10-CM | POA: Diagnosis not present

## 2020-01-14 DIAGNOSIS — J449 Chronic obstructive pulmonary disease, unspecified: Secondary | ICD-10-CM | POA: Diagnosis not present

## 2020-02-06 DIAGNOSIS — J449 Chronic obstructive pulmonary disease, unspecified: Secondary | ICD-10-CM | POA: Diagnosis not present

## 2020-02-06 DIAGNOSIS — J441 Chronic obstructive pulmonary disease with (acute) exacerbation: Secondary | ICD-10-CM | POA: Diagnosis not present

## 2020-02-07 DIAGNOSIS — J441 Chronic obstructive pulmonary disease with (acute) exacerbation: Secondary | ICD-10-CM | POA: Diagnosis not present

## 2020-02-07 DIAGNOSIS — J9601 Acute respiratory failure with hypoxia: Secondary | ICD-10-CM | POA: Diagnosis not present

## 2020-02-09 DIAGNOSIS — J449 Chronic obstructive pulmonary disease, unspecified: Secondary | ICD-10-CM | POA: Diagnosis not present

## 2020-02-11 DIAGNOSIS — Z23 Encounter for immunization: Secondary | ICD-10-CM | POA: Diagnosis not present

## 2020-02-11 DIAGNOSIS — Z6836 Body mass index (BMI) 36.0-36.9, adult: Secondary | ICD-10-CM | POA: Diagnosis not present

## 2020-02-11 DIAGNOSIS — N5089 Other specified disorders of the male genital organs: Secondary | ICD-10-CM | POA: Diagnosis not present

## 2020-02-11 DIAGNOSIS — Z1331 Encounter for screening for depression: Secondary | ICD-10-CM | POA: Diagnosis not present

## 2020-02-11 DIAGNOSIS — J449 Chronic obstructive pulmonary disease, unspecified: Secondary | ICD-10-CM | POA: Diagnosis not present

## 2020-02-11 DIAGNOSIS — I10 Essential (primary) hypertension: Secondary | ICD-10-CM | POA: Diagnosis not present

## 2020-02-11 DIAGNOSIS — R739 Hyperglycemia, unspecified: Secondary | ICD-10-CM | POA: Diagnosis not present

## 2020-02-11 DIAGNOSIS — E559 Vitamin D deficiency, unspecified: Secondary | ICD-10-CM | POA: Diagnosis not present

## 2020-02-11 DIAGNOSIS — E782 Mixed hyperlipidemia: Secondary | ICD-10-CM | POA: Diagnosis not present

## 2020-02-11 DIAGNOSIS — B356 Tinea cruris: Secondary | ICD-10-CM | POA: Diagnosis not present

## 2020-02-18 DIAGNOSIS — M79672 Pain in left foot: Secondary | ICD-10-CM | POA: Diagnosis not present

## 2020-02-18 DIAGNOSIS — Z79899 Other long term (current) drug therapy: Secondary | ICD-10-CM | POA: Diagnosis not present

## 2020-02-18 DIAGNOSIS — M19072 Primary osteoarthritis, left ankle and foot: Secondary | ICD-10-CM | POA: Diagnosis not present

## 2020-02-18 DIAGNOSIS — M79642 Pain in left hand: Secondary | ICD-10-CM | POA: Diagnosis not present

## 2020-02-18 DIAGNOSIS — M19042 Primary osteoarthritis, left hand: Secondary | ICD-10-CM | POA: Diagnosis not present

## 2020-02-18 DIAGNOSIS — M19071 Primary osteoarthritis, right ankle and foot: Secondary | ICD-10-CM | POA: Diagnosis not present

## 2020-02-18 DIAGNOSIS — M1812 Unilateral primary osteoarthritis of first carpometacarpal joint, left hand: Secondary | ICD-10-CM | POA: Diagnosis not present

## 2020-02-18 DIAGNOSIS — M21612 Bunion of left foot: Secondary | ICD-10-CM | POA: Diagnosis not present

## 2020-02-18 DIAGNOSIS — M79671 Pain in right foot: Secondary | ICD-10-CM | POA: Diagnosis not present

## 2020-02-18 DIAGNOSIS — M25551 Pain in right hip: Secondary | ICD-10-CM | POA: Diagnosis not present

## 2020-02-18 DIAGNOSIS — M2550 Pain in unspecified joint: Secondary | ICD-10-CM | POA: Diagnosis not present

## 2020-02-18 DIAGNOSIS — M549 Dorsalgia, unspecified: Secondary | ICD-10-CM | POA: Diagnosis not present

## 2020-02-18 DIAGNOSIS — M79641 Pain in right hand: Secondary | ICD-10-CM | POA: Diagnosis not present

## 2020-02-18 DIAGNOSIS — M19041 Primary osteoarthritis, right hand: Secondary | ICD-10-CM | POA: Diagnosis not present

## 2020-02-18 DIAGNOSIS — M069 Rheumatoid arthritis, unspecified: Secondary | ICD-10-CM | POA: Diagnosis not present

## 2020-02-18 DIAGNOSIS — M545 Low back pain, unspecified: Secondary | ICD-10-CM | POA: Diagnosis not present

## 2020-02-18 DIAGNOSIS — M1811 Unilateral primary osteoarthritis of first carpometacarpal joint, right hand: Secondary | ICD-10-CM | POA: Diagnosis not present

## 2020-03-04 DIAGNOSIS — M069 Rheumatoid arthritis, unspecified: Secondary | ICD-10-CM | POA: Diagnosis not present

## 2020-03-09 DIAGNOSIS — J9601 Acute respiratory failure with hypoxia: Secondary | ICD-10-CM | POA: Diagnosis not present

## 2020-03-09 DIAGNOSIS — J441 Chronic obstructive pulmonary disease with (acute) exacerbation: Secondary | ICD-10-CM | POA: Diagnosis not present

## 2020-03-10 DIAGNOSIS — J441 Chronic obstructive pulmonary disease with (acute) exacerbation: Secondary | ICD-10-CM | POA: Diagnosis not present

## 2020-03-10 DIAGNOSIS — J449 Chronic obstructive pulmonary disease, unspecified: Secondary | ICD-10-CM | POA: Diagnosis not present

## 2020-03-16 DIAGNOSIS — J441 Chronic obstructive pulmonary disease with (acute) exacerbation: Secondary | ICD-10-CM | POA: Diagnosis not present

## 2020-03-16 DIAGNOSIS — K219 Gastro-esophageal reflux disease without esophagitis: Secondary | ICD-10-CM | POA: Diagnosis not present

## 2020-03-16 DIAGNOSIS — J1282 Pneumonia due to coronavirus disease 2019: Secondary | ICD-10-CM | POA: Diagnosis not present

## 2020-03-16 DIAGNOSIS — R079 Chest pain, unspecified: Secondary | ICD-10-CM | POA: Diagnosis not present

## 2020-03-16 DIAGNOSIS — J440 Chronic obstructive pulmonary disease with acute lower respiratory infection: Secondary | ICD-10-CM | POA: Diagnosis not present

## 2020-03-16 DIAGNOSIS — R0781 Pleurodynia: Secondary | ICD-10-CM | POA: Diagnosis not present

## 2020-03-16 DIAGNOSIS — M069 Rheumatoid arthritis, unspecified: Secondary | ICD-10-CM | POA: Diagnosis not present

## 2020-03-16 DIAGNOSIS — J9601 Acute respiratory failure with hypoxia: Secondary | ICD-10-CM | POA: Diagnosis not present

## 2020-03-16 DIAGNOSIS — J159 Unspecified bacterial pneumonia: Secondary | ICD-10-CM | POA: Diagnosis not present

## 2020-03-16 DIAGNOSIS — R69 Illness, unspecified: Secondary | ICD-10-CM | POA: Diagnosis not present

## 2020-03-16 DIAGNOSIS — J181 Lobar pneumonia, unspecified organism: Secondary | ICD-10-CM | POA: Diagnosis not present

## 2020-03-16 DIAGNOSIS — R0602 Shortness of breath: Secondary | ICD-10-CM | POA: Diagnosis not present

## 2020-03-16 DIAGNOSIS — U071 COVID-19: Secondary | ICD-10-CM | POA: Diagnosis not present

## 2020-03-16 DIAGNOSIS — R109 Unspecified abdominal pain: Secondary | ICD-10-CM | POA: Diagnosis not present

## 2020-03-30 DIAGNOSIS — J449 Chronic obstructive pulmonary disease, unspecified: Secondary | ICD-10-CM | POA: Diagnosis not present

## 2020-03-30 DIAGNOSIS — I10 Essential (primary) hypertension: Secondary | ICD-10-CM | POA: Diagnosis not present

## 2020-03-30 DIAGNOSIS — R600 Localized edema: Secondary | ICD-10-CM | POA: Diagnosis not present

## 2020-03-30 DIAGNOSIS — M069 Rheumatoid arthritis, unspecified: Secondary | ICD-10-CM | POA: Diagnosis not present

## 2020-03-31 DIAGNOSIS — M7121 Synovial cyst of popliteal space [Baker], right knee: Secondary | ICD-10-CM | POA: Diagnosis not present

## 2020-03-31 DIAGNOSIS — R600 Localized edema: Secondary | ICD-10-CM | POA: Diagnosis not present

## 2020-03-31 DIAGNOSIS — R7989 Other specified abnormal findings of blood chemistry: Secondary | ICD-10-CM | POA: Diagnosis not present

## 2020-04-01 DIAGNOSIS — M0579 Rheumatoid arthritis with rheumatoid factor of multiple sites without organ or systems involvement: Secondary | ICD-10-CM | POA: Diagnosis not present

## 2020-04-01 DIAGNOSIS — Z79899 Other long term (current) drug therapy: Secondary | ICD-10-CM | POA: Diagnosis not present

## 2020-04-01 DIAGNOSIS — M1990 Unspecified osteoarthritis, unspecified site: Secondary | ICD-10-CM | POA: Diagnosis not present

## 2020-04-01 DIAGNOSIS — M7989 Other specified soft tissue disorders: Secondary | ICD-10-CM | POA: Diagnosis not present

## 2020-04-01 DIAGNOSIS — M25551 Pain in right hip: Secondary | ICD-10-CM | POA: Diagnosis not present

## 2020-04-01 DIAGNOSIS — M2550 Pain in unspecified joint: Secondary | ICD-10-CM | POA: Diagnosis not present

## 2020-04-05 DIAGNOSIS — J441 Chronic obstructive pulmonary disease with (acute) exacerbation: Secondary | ICD-10-CM | POA: Diagnosis not present

## 2020-04-05 DIAGNOSIS — J449 Chronic obstructive pulmonary disease, unspecified: Secondary | ICD-10-CM | POA: Diagnosis not present

## 2020-04-06 DIAGNOSIS — S3130XA Unspecified open wound of scrotum and testes, initial encounter: Secondary | ICD-10-CM | POA: Diagnosis not present

## 2020-04-06 DIAGNOSIS — Z125 Encounter for screening for malignant neoplasm of prostate: Secondary | ICD-10-CM | POA: Diagnosis not present

## 2020-04-09 DIAGNOSIS — J9601 Acute respiratory failure with hypoxia: Secondary | ICD-10-CM | POA: Diagnosis not present

## 2020-04-09 DIAGNOSIS — J441 Chronic obstructive pulmonary disease with (acute) exacerbation: Secondary | ICD-10-CM | POA: Diagnosis not present

## 2020-04-16 DIAGNOSIS — M069 Rheumatoid arthritis, unspecified: Secondary | ICD-10-CM | POA: Diagnosis not present

## 2020-04-16 DIAGNOSIS — Z6836 Body mass index (BMI) 36.0-36.9, adult: Secondary | ICD-10-CM | POA: Diagnosis not present

## 2020-04-16 DIAGNOSIS — R600 Localized edema: Secondary | ICD-10-CM | POA: Diagnosis not present

## 2020-04-16 DIAGNOSIS — I10 Essential (primary) hypertension: Secondary | ICD-10-CM | POA: Diagnosis not present

## 2020-04-28 DIAGNOSIS — J441 Chronic obstructive pulmonary disease with (acute) exacerbation: Secondary | ICD-10-CM | POA: Diagnosis not present

## 2020-04-28 DIAGNOSIS — J449 Chronic obstructive pulmonary disease, unspecified: Secondary | ICD-10-CM | POA: Diagnosis not present

## 2020-04-29 DIAGNOSIS — Z6837 Body mass index (BMI) 37.0-37.9, adult: Secondary | ICD-10-CM | POA: Diagnosis not present

## 2020-04-29 DIAGNOSIS — M0579 Rheumatoid arthritis with rheumatoid factor of multiple sites without organ or systems involvement: Secondary | ICD-10-CM | POA: Diagnosis not present

## 2020-04-29 DIAGNOSIS — E669 Obesity, unspecified: Secondary | ICD-10-CM | POA: Diagnosis not present

## 2020-04-29 DIAGNOSIS — M150 Primary generalized (osteo)arthritis: Secondary | ICD-10-CM | POA: Diagnosis not present

## 2020-04-29 DIAGNOSIS — M25561 Pain in right knee: Secondary | ICD-10-CM | POA: Diagnosis not present

## 2020-05-07 DIAGNOSIS — J441 Chronic obstructive pulmonary disease with (acute) exacerbation: Secondary | ICD-10-CM | POA: Diagnosis not present

## 2020-05-07 DIAGNOSIS — J9601 Acute respiratory failure with hypoxia: Secondary | ICD-10-CM | POA: Diagnosis not present

## 2020-05-11 DIAGNOSIS — E782 Mixed hyperlipidemia: Secondary | ICD-10-CM | POA: Diagnosis not present

## 2020-05-11 DIAGNOSIS — Z6836 Body mass index (BMI) 36.0-36.9, adult: Secondary | ICD-10-CM | POA: Diagnosis not present

## 2020-05-11 DIAGNOSIS — E559 Vitamin D deficiency, unspecified: Secondary | ICD-10-CM | POA: Diagnosis not present

## 2020-05-11 DIAGNOSIS — R748 Abnormal levels of other serum enzymes: Secondary | ICD-10-CM | POA: Diagnosis not present

## 2020-05-11 DIAGNOSIS — R739 Hyperglycemia, unspecified: Secondary | ICD-10-CM | POA: Diagnosis not present

## 2020-05-11 DIAGNOSIS — J449 Chronic obstructive pulmonary disease, unspecified: Secondary | ICD-10-CM | POA: Diagnosis not present

## 2020-05-11 DIAGNOSIS — M069 Rheumatoid arthritis, unspecified: Secondary | ICD-10-CM | POA: Diagnosis not present

## 2020-05-11 DIAGNOSIS — I10 Essential (primary) hypertension: Secondary | ICD-10-CM | POA: Diagnosis not present

## 2020-05-26 DIAGNOSIS — J449 Chronic obstructive pulmonary disease, unspecified: Secondary | ICD-10-CM | POA: Diagnosis not present

## 2020-05-26 DIAGNOSIS — J441 Chronic obstructive pulmonary disease with (acute) exacerbation: Secondary | ICD-10-CM | POA: Diagnosis not present

## 2020-06-03 DIAGNOSIS — D72829 Elevated white blood cell count, unspecified: Secondary | ICD-10-CM | POA: Diagnosis not present

## 2020-06-07 DIAGNOSIS — J9601 Acute respiratory failure with hypoxia: Secondary | ICD-10-CM | POA: Diagnosis not present

## 2020-06-07 DIAGNOSIS — J441 Chronic obstructive pulmonary disease with (acute) exacerbation: Secondary | ICD-10-CM | POA: Diagnosis not present

## 2020-06-30 DIAGNOSIS — E669 Obesity, unspecified: Secondary | ICD-10-CM | POA: Diagnosis not present

## 2020-06-30 DIAGNOSIS — J449 Chronic obstructive pulmonary disease, unspecified: Secondary | ICD-10-CM | POA: Diagnosis not present

## 2020-06-30 DIAGNOSIS — M150 Primary generalized (osteo)arthritis: Secondary | ICD-10-CM | POA: Diagnosis not present

## 2020-06-30 DIAGNOSIS — Z6839 Body mass index (BMI) 39.0-39.9, adult: Secondary | ICD-10-CM | POA: Diagnosis not present

## 2020-06-30 DIAGNOSIS — M0579 Rheumatoid arthritis with rheumatoid factor of multiple sites without organ or systems involvement: Secondary | ICD-10-CM | POA: Diagnosis not present

## 2020-06-30 DIAGNOSIS — M25561 Pain in right knee: Secondary | ICD-10-CM | POA: Diagnosis not present

## 2020-07-05 DIAGNOSIS — S3130XA Unspecified open wound of scrotum and testes, initial encounter: Secondary | ICD-10-CM | POA: Diagnosis not present

## 2020-07-07 DIAGNOSIS — J441 Chronic obstructive pulmonary disease with (acute) exacerbation: Secondary | ICD-10-CM | POA: Diagnosis not present

## 2020-07-07 DIAGNOSIS — J9601 Acute respiratory failure with hypoxia: Secondary | ICD-10-CM | POA: Diagnosis not present

## 2020-07-26 DIAGNOSIS — J449 Chronic obstructive pulmonary disease, unspecified: Secondary | ICD-10-CM | POA: Diagnosis not present

## 2020-07-28 DIAGNOSIS — M069 Rheumatoid arthritis, unspecified: Secondary | ICD-10-CM | POA: Diagnosis not present

## 2020-07-28 DIAGNOSIS — I10 Essential (primary) hypertension: Secondary | ICD-10-CM | POA: Diagnosis not present

## 2020-07-28 DIAGNOSIS — E785 Hyperlipidemia, unspecified: Secondary | ICD-10-CM | POA: Diagnosis not present

## 2020-07-28 DIAGNOSIS — Z7951 Long term (current) use of inhaled steroids: Secondary | ICD-10-CM | POA: Diagnosis not present

## 2020-07-28 DIAGNOSIS — R69 Illness, unspecified: Secondary | ICD-10-CM | POA: Diagnosis not present

## 2020-07-28 DIAGNOSIS — L309 Dermatitis, unspecified: Secondary | ICD-10-CM | POA: Diagnosis not present

## 2020-07-28 DIAGNOSIS — J449 Chronic obstructive pulmonary disease, unspecified: Secondary | ICD-10-CM | POA: Diagnosis not present

## 2020-07-28 DIAGNOSIS — Z6839 Body mass index (BMI) 39.0-39.9, adult: Secondary | ICD-10-CM | POA: Diagnosis not present

## 2020-07-28 DIAGNOSIS — M1990 Unspecified osteoarthritis, unspecified site: Secondary | ICD-10-CM | POA: Diagnosis not present

## 2020-07-28 DIAGNOSIS — Z008 Encounter for other general examination: Secondary | ICD-10-CM | POA: Diagnosis not present

## 2020-07-29 DIAGNOSIS — M1611 Unilateral primary osteoarthritis, right hip: Secondary | ICD-10-CM | POA: Diagnosis not present

## 2020-07-30 ENCOUNTER — Encounter (HOSPITAL_BASED_OUTPATIENT_CLINIC_OR_DEPARTMENT_OTHER): Payer: Medicare HMO | Admitting: Internal Medicine

## 2020-08-05 NOTE — Patient Instructions (Addendum)
DUE TO COVID-19 ONLY ONE VISITOR IS ALLOWED TO COME WITH YOU AND STAY IN THE WAITING ROOM ONLY DURING PRE OP AND PROCEDURE.   **NO VISITORS ARE ALLOWED IN THE SHORT STAY AREA OR RECOVERY ROOM!!**  IF YOU WILL BE ADMITTED INTO THE HOSPITAL YOU ARE ALLOWED ONLY TWO SUPPORT PEOPLE DURING VISITATION HOURS ONLY (10AM -8PM)   . The support person(s) may change daily. . The support person(s) must pass our screening, gel in and out, and wear a mask at all times, including in the patient's room. . Patients must also wear a mask when staff or their support person are in the room.  No visitors under the age of 42. Any visitor under the age of 58 must be accompanied by an adult.    COVID SWAB TESTING MUST BE COMPLETED ON:  08/16/20 @ 8:20 AM   [address] [place], [place] [address]   . You are not required to quarantine, however you are required to wear a well-fitted mask when you are out and around people not in your household.  [facility] Rile Hygiene often . Do NOT share personal items . Notify your provider if you are in close contact with someone who has COVID or you develop fever 100.4 or greater, new onset of sneezing, cough, sore throat, shortness of breath or body aches.       Your procedure is scheduled on: 08/17/20   Report to [facility] Main  Entrance    Report to admitting at 7:40 AM   Call this number if you have problems the morning of surgery [phone]   Do not eat food :After Midnight.   May have liquids until  7:10 AM  day of surgery  CLEAR LIQUID DIET  Foods Allowed                                                                     Foods Excluded  Water, Black Coffee and tea, regular and decaf             liquids that you cannot  Plain Jell-O in any flavor  (No red)                                    see through such as: Fruit ices (not with fruit pulp)                                            milk, soups, orange juice              Iced Popsicles (No red)                                                 All solid food  Apple juices Sports drinks like Gatorade (No red) Lightly seasoned clear broth or consume(fat free) Sugar, honey syrup     Oral Hygiene is also important to reduce your risk of infection.                                    Remember - BRUSH YOUR TEETH THE MORNING OF SURGERY WITH YOUR REGULAR TOOTHPASTE   Do NOT smoke after Midnight   Take these medicines the morning of surgery with A SIP OF WATER: Atorvastatin, Metoprolol.                               You may not have any metal on your body including jewelry, and body piercing             Do not wear lotions, powders, cologne, or deodorant  Do not shave  48 hours prior to surgery.               Men may shave face and neck.   Do not bring valuables to the hospital. [facility] IS NOT RESPONSIBLE   FOR VALUABLES.   Contacts, dentures or bridgework may not be worn into surgery.   Bring small overnight bag day of surgery.               Please read over the following fact sheets you were given: IF YOU HAVE QUESTIONS ABOUT YOUR PRE OP INSTRUCTIONS PLEASE  CALL [phone]   [place] - Preparing for Surgery Before surgery, you can play an important role.  Because skin is not sterile, your skin needs to be as free of germs as possible.  You can reduce the number of germs on your skin by washing with CHG (chlorahexidine gluconate) soap before surgery.  CHG is an antiseptic cleaner which kills germs and bonds with the skin to continue killing germs even after washing. Please DO NOT use if you have an allergy to CHG or antibacterial soaps.  If your skin becomes reddened/irritated stop using the CHG and inform your nurse when you arrive at Short Stay. Do not shave (including legs and underarms) for at least 48 hours prior to the first CHG shower.  You may shave your face/neck.  Please follow these instructions carefully:  1.  Shower with CHG Soap the  night before surgery and the  morning of surgery.  2.  If you choose to wash your hair, wash your hair first as usual with your normal  shampoo.  3.  After you shampoo, rinse your hair and body thoroughly to remove the shampoo.                             4.  Use CHG as you would any other liquid soap.  You can apply chg directly to the skin and wash.  Gently with a scrungie or clean washcloth.  5.  Apply the CHG Soap to your body ONLY FROM THE NECK DOWN.   Do   not use on face/ open                           Wound or open sores. Avoid contact with eyes, ears mouth and   genitals (private parts).  Wash face,  Genitals (private parts) with your normal soap.             6.  Wash thoroughly, paying special attention to the area where your    surgery  will be performed.  7.  Thoroughly rinse your body with warm water from the neck down.  8.  DO NOT shower/wash with your normal soap after using and rinsing off the CHG Soap.                9.  Pat yourself dry with a clean towel.            10.  Wear clean pajamas.            11.  Place clean sheets on your bed the night of your first shower and do not  sleep with pets. Day of Surgery : Do not apply any lotions/deodorants the morning of surgery.  Please wear clean clothes to the hospital/surgery center.  FAILURE TO FOLLOW THESE INSTRUCTIONS MAY RESULT IN THE CANCELLATION OF YOUR SURGERY  PATIENT SIGNATURE_________________________________  NURSE SIGNATURE__________________________________  ________________________________________________________________________  WHAT IS A BLOOD TRANSFUSION? Blood Transfusion Information  A transfusion is the replacement of blood or some of its parts. Blood is made up of multiple cells which provide different functions.  Red blood cells carry oxygen and are used for blood loss replacement.  White blood cells fight against infection.  Platelets control bleeding.  Plasma helps clot  blood.  Other blood products are available for specialized needs, such as hemophilia or other clotting disorders. BEFORE THE TRANSFUSION  Who gives blood for transfusions?   Healthy volunteers who are fully evaluated to make sure their blood is safe. This is blood bank blood. Transfusion therapy is the safest it has ever been in the practice of medicine. Before blood is taken from a donor, a complete history is taken to make sure that person has no history of diseases nor engages in risky social behavior (examples are intravenous drug use or sexual activity with multiple partners). The donor's travel history is screened to minimize risk of transmitting infections, such as malaria. The donated blood is tested for signs of infectious diseases, such as HIV and hepatitis. The blood is then tested to be sure it is compatible with you in order to minimize the chance of a transfusion reaction. If you or a relative donates blood, this is often done in anticipation of surgery and is not appropriate for emergency situations. It takes many days to process the donated blood. RISKS AND COMPLICATIONS Although transfusion therapy is very safe and saves many lives, the main dangers of transfusion include:   Getting an infectious disease.  Developing a transfusion reaction. This is an allergic reaction to something in the blood you were given. Every precaution is taken to prevent this. The decision to have a blood transfusion has been considered carefully by your caregiver before blood is given. Blood is not given unless the benefits outweigh the risks. AFTER THE TRANSFUSION  Right after receiving a blood transfusion, you will usually feel much better and more energetic. This is especially true if your red blood cells have gotten low (anemic). The transfusion raises the level of the red blood cells which carry oxygen, and this usually causes an energy increase.  The nurse administering the transfusion will monitor  you carefully for complications. HOME CARE INSTRUCTIONS  No special instructions are needed after a transfusion. You may find your energy is better. Speak with your  caregiver about any limitations on activity for underlying diseases you may have. SEEK MEDICAL CARE IF:   Your condition is not improving after your transfusion.  You develop redness or irritation at the intravenous (IV) site. SEEK IMMEDIATE MEDICAL CARE IF:  Any of the following symptoms occur over the next 12 hours:  Shaking chills.  You have a temperature by mouth above 102 F (38.9 C), not controlled by medicine.  Chest, back, or muscle pain.  People around you feel you are not acting correctly or are confused.  Shortness of breath or difficulty breathing.  Dizziness and fainting.  You get a rash or develop hives.  You have a decrease in urine output.  Your urine turns a dark color or changes to pink, red, or brown. Any of the following symptoms occur over the next 10 days:  You have a temperature by mouth above 102 F (38.9 C), not controlled by medicine.  Shortness of breath.  Weakness after normal activity.  The white part of the eye turns yellow (jaundice).  You have a decrease in the amount of urine or are urinating less often.  Your urine turns a dark color or changes to pink, red, or brown. Document Released: 02/18/2000 Document Revised: 05/15/2011 Document Reviewed: 10/07/2007 [facility] Patient Information 2014 [place], [place].  _______________________________________________________________________

## 2020-08-05 NOTE — Progress Notes (Signed)
Please put in orders for PAT visit scheduled 08/10/20.

## 2020-08-06 ENCOUNTER — Encounter (HOSPITAL_COMMUNITY): Payer: Self-pay

## 2020-08-06 NOTE — Progress Notes (Addendum)
COVID Vaccine Completed: Date COVID Vaccine completed: Has received booster: COVID vaccine manufacturer: Pfizer    [employer]   Date of COVID positive in last 90 days:  PCP - Renae Fickle, MD Cardiologist -   Chest x-ray - 2022 EKG - 2022 Stress Test -  ECHO -  Cardiac Cath -  Pacemaker/ICD device last checked: Spinal Cord Stimulator:  Sleep Study -  CPAP -   Fasting Blood Sugar -  Checks Blood Sugar _____ times a day  Blood Thinner Instructions: Aspirin Instructions:  ASA 81 mg Last Dose:  Activity level:  Can go up a flight of stairs and perform activities of daily living without stopping and without symptoms of chest pain or shortness of breath.   Able to exercise without symptoms  Unable to go up a flight of stairs without symptoms of      Anesthesia review:  O2, COPD  Patient denies shortness of breath, fever, cough and chest pain at PAT appointment   Patient verbalized understanding of instructions that were given to them at the PAT appointment. Patient was also instructed that they will need to review over the PAT instructions again at home before surgery.

## 2020-08-07 DIAGNOSIS — J9601 Acute respiratory failure with hypoxia: Secondary | ICD-10-CM | POA: Diagnosis not present

## 2020-08-07 DIAGNOSIS — J441 Chronic obstructive pulmonary disease with (acute) exacerbation: Secondary | ICD-10-CM | POA: Diagnosis not present

## 2020-08-09 ENCOUNTER — Encounter (HOSPITAL_BASED_OUTPATIENT_CLINIC_OR_DEPARTMENT_OTHER): Payer: Medicare HMO | Attending: Physician Assistant | Admitting: Internal Medicine

## 2020-08-09 ENCOUNTER — Other Ambulatory Visit: Payer: Self-pay

## 2020-08-09 DIAGNOSIS — Z9081 Acquired absence of spleen: Secondary | ICD-10-CM | POA: Diagnosis not present

## 2020-08-09 DIAGNOSIS — F17200 Nicotine dependence, unspecified, uncomplicated: Secondary | ICD-10-CM | POA: Insufficient documentation

## 2020-08-09 DIAGNOSIS — R69 Illness, unspecified: Secondary | ICD-10-CM | POA: Diagnosis not present

## 2020-08-09 DIAGNOSIS — X58XXXA Exposure to other specified factors, initial encounter: Secondary | ICD-10-CM | POA: Insufficient documentation

## 2020-08-09 DIAGNOSIS — S3130XA Unspecified open wound of scrotum and testes, initial encounter: Secondary | ICD-10-CM | POA: Diagnosis not present

## 2020-08-09 DIAGNOSIS — M0579 Rheumatoid arthritis with rheumatoid factor of multiple sites without organ or systems involvement: Secondary | ICD-10-CM

## 2020-08-09 DIAGNOSIS — E7800 Pure hypercholesterolemia, unspecified: Secondary | ICD-10-CM | POA: Insufficient documentation

## 2020-08-09 DIAGNOSIS — J449 Chronic obstructive pulmonary disease, unspecified: Secondary | ICD-10-CM | POA: Diagnosis not present

## 2020-08-09 DIAGNOSIS — I10 Essential (primary) hypertension: Secondary | ICD-10-CM | POA: Insufficient documentation

## 2020-08-09 NOTE — Progress Notes (Signed)
YONAH, TANGEMAN ([id number]) Visit Report for 08/09/2020 Abuse/Suicide Risk Screen Details Patient Name: Date of Service: Nathan Nicholson 08/09/2020 2:45 PM Medical Record Number: [id number] Patient Account Number: [id number] Date of Birth/Sex: Treating RN: [birth year] (65 y.o. Male) Fonnie Mu Primary Care Ona Rathert: Shela Commons HN Other Clinician: Referring Lun Muro: Treating Jeffren Dombek/Extender: Diego Cory, JO HN Weeks in Treatment: 0 Abuse/Suicide Risk Screen Items Answer ABUSE RISK SCREEN: Has anyone close to you tried to hurt or harm you recentlyo No Do you feel uncomfortable with anyone in your familyo No Has anyone forced you do things that you didnt want to doo No Electronic Signature(s) Signed: 08/09/2020 5:37:32 PM By: Fonnie Mu RN Entered By: Fonnie Mu on 08/09/2020 14:39:56 -------------------------------------------------------------------------------- Activities of Daily Living Details Patient Name: Date of Service: Nathan Nicholson 08/09/2020 2:45 PM Medical Record Number: [id number] Patient Account Number: [id number] Date of Birth/Sex: Treating RN: Jan 08, 1956 (65 y.o. Male) Fonnie Mu Primary Care Millisa Giarrusso: Shela Commons HN Other Clinician: Referring Donye Dauenhauer: Treating Jaxie Racanelli/Extender: Diego Cory, JO HN Weeks in Treatment: 0 Activities of Daily Living Items Answer Activities of Daily Living (Please select one for each item) Drive Automobile Completely Able T Medications ake Completely Able Use T elephone Completely Able Care for Appearance Completely Able Use T oilet Completely Able Bath / Shower Completely Able Dress Self Completely Able Feed Self Completely Able Walk Completely Able Get In / Out Bed Completely Able Housework Completely Able Prepare Meals Completely Able Handle Money Completely Able Shop for Self Completely Able Electronic Signature(s) Signed: 08/09/2020 5:37:32 PM By: Fonnie Mu RN Entered By: Fonnie Mu on 08/09/2020 14:40:11 -------------------------------------------------------------------------------- Education Screening Details Patient Name: Date of Service: Nathan Chuck L. 08/09/2020 2:45 PM Medical Record Number: [id number] Patient Account Number: [id number] Date of Birth/Sex: Treating RN: 1955/06/21 (65 y.o. Male) Fonnie Mu Primary Care Marquies Wanat: Shela Commons HN Other Clinician: Referring Elynor Kallenberger: Treating Adelena Desantiago/Extender: Diego Cory, JO HN Weeks in Treatment: 0 Primary Learner Assessed: Patient Learning Preferences/Education Level/Primary Language Learning Preference: Explanation, Demonstration, Communication Board Highest Education Level: Grade School Preferred Language: English Cognitive Barrier Language Barrier: No Translator Needed: No Memory Deficit: No Emotional Barrier: No Cultural/Religious Beliefs Affecting Medical Care: No Physical Barrier Impaired Vision: Yes Glasses Impaired Hearing: No Decreased Hand dexterity: No Knowledge/Comprehension Knowledge Level: High Comprehension Level: High Ability to understand written instructions: High Ability to understand verbal instructions: High Motivation Anxiety Level: Calm Cooperation: Cooperative Education Importance: Denies Need Interest in Health Problems: Asks Questions Perception: Coherent Willingness to Engage in Self-Management High Activities: Readiness to Engage in Self-Management High Activities: Electronic Signature(s) Signed: 08/09/2020 5:37:32 PM By: Fonnie Mu RN Entered By: Fonnie Mu on 08/09/2020 14:40:49 -------------------------------------------------------------------------------- Fall Risk Assessment Details Patient Name: Date of Service: Nathan Nicholson, Nathan Ramming L. 08/09/2020 2:45 PM Medical Record Number: [id number] Patient Account Number: [id number] Date of Birth/Sex: Treating RN: 09/10/1955 (65 y.o. Male)  Fonnie Mu Primary Care Korrina Zern: Shela Commons HN Other Clinician: Referring Veer Elamin: Treating Shyasia Funches/Extender: Diego Cory, JO HN Weeks in Treatment: 0 Fall Risk Assessment Items Have you had 2 or more falls in the last 12 monthso 0 No Have you had any fall that resulted in injury in the last 12 monthso 0 No FALLS RISK SCREEN History of falling - immediate or within 3 months 0 No Secondary diagnosis (Do you have 2 or more medical diagnoseso) 0 No Ambulatory aid None/bed rest/wheelchair/nurse 0 No Crutches/cane/walker  0 No Furniture 0 No Intravenous therapy Access/Saline/Heparin Lock 0 No Gait/Transferring Normal/ bed rest/ wheelchair 0 No Weak (short steps with or without shuffle, stooped but able to lift head while walking, may seek 0 No support from furniture) Impaired (short steps with shuffle, may have difficulty arising from chair, head down, impaired 0 No balance) Mental Status Oriented to own ability 0 No Electronic Signature(s) Signed: 08/09/2020 5:37:32 PM By: Fonnie Mu RN Entered By: Fonnie Mu on 08/09/2020 14:40:56 -------------------------------------------------------------------------------- Foot Assessment Details Patient Name: Date of Service: Nathan Nicholson, Nathan Ramming L. 08/09/2020 2:45 PM Medical Record Number: [id number] Patient Account Number: [id number] Date of Birth/Sex: Treating RN: May 18, 1955 (65 y.o. Male) Fonnie Mu Primary Care Vetra Shinall: Shela Commons HN Other Clinician: Referring Kerstin Crusoe: Treating Kaydi Kley/Extender: Diego Cory, JO HN Weeks in Treatment: 0 Foot Assessment Items Site Locations + = Sensation present, - = Sensation absent, C = Callus, U = Ulcer R = Redness, W = Warmth, M = Maceration, PU = Pre-ulcerative lesion F = Fissure, S = Swelling, D = Dryness Assessment Right: Left: Other Deformity: No No Prior Foot Ulcer: No No Prior Amputation: No No Charcot Joint: No No Ambulatory  Status: Gait: Notes N/A no LE WOUNDS Electronic Signature(s) Signed: 08/09/2020 5:37:32 PM By: Fonnie Mu RN Entered By: Fonnie Mu on 08/09/2020 14:41:11 -------------------------------------------------------------------------------- Nutrition Risk Screening Details Patient Name: Date of Service: Nathan Chuck L. 08/09/2020 2:45 PM Medical Record Number: [id number] Patient Account Number: [id number] Date of Birth/Sex: Treating RN: 04/30/1955 (65 y.o. Male) Fonnie Mu Primary Care Amiaya Mcneeley: Shela Commons HN Other Clinician: Referring Larae Caison: Treating Letrice Pollok/Extender: Diego Cory, JO HN Weeks in Treatment: 0 Height (in): 62 Weight (lbs): 220 Body Mass Index (BMI): 40.2 Nutrition Risk Screening Items Score Screening NUTRITION RISK SCREEN: I have an illness or condition that made me change the kind and/or amount of food I eat 0 No I eat fewer than two meals per day 0 No I eat few fruits and vegetables, or milk products 0 No I have three or more drinks of beer, liquor or wine almost every day 0 No I have tooth or mouth problems that make it hard for me to eat 0 No I don't always have enough money to buy the food I need 0 No I eat alone most of the time 0 No I take three or more different prescribed or over-the-counter drugs a day 0 No Without wanting to, I have lost or gained 10 pounds in the last six months 0 No I am not always physically able to shop, cook and/or feed myself 0 No Nutrition Protocols Good Risk Protocol 0 No interventions needed Moderate Risk Protocol High Risk Proctocol Risk Level: Good Risk Score: 0 Electronic Signature(s) Signed: 08/09/2020 5:37:32 PM By: Fonnie Mu RN Entered By: Fonnie Mu on 08/09/2020 14:41:01

## 2020-08-10 ENCOUNTER — Encounter (HOSPITAL_COMMUNITY): Payer: Self-pay

## 2020-08-10 ENCOUNTER — Encounter (HOSPITAL_COMMUNITY)
Admission: RE | Admit: 2020-08-10 | Discharge: 2020-08-10 | Disposition: A | Discharge status: Home / Self Care | Payer: Medicare HMO | Source: Ambulatory Visit | Attending: Orthopedic Surgery | Admitting: Orthopedic Surgery

## 2020-08-10 DIAGNOSIS — Z01812 Encounter for preprocedural laboratory examination: Secondary | ICD-10-CM | POA: Diagnosis present

## 2020-08-10 HISTORY — DX: Pneumonia, unspecified organism: J18.9

## 2020-08-10 HISTORY — DX: Rheumatoid arthritis, unspecified: M06.9

## 2020-08-10 HISTORY — DX: Essential (primary) hypertension: I10

## 2020-08-10 HISTORY — DX: Dyspnea, unspecified: R06.00

## 2020-08-10 HISTORY — DX: Chronic obstructive pulmonary disease, unspecified: J44.9

## 2020-08-10 HISTORY — DX: COVID-19: U07.1

## 2020-08-10 LAB — BASIC METABOLIC PANEL
Anion gap: 8 (ref 5–15)
BUN: 13 mg/dL (ref 8–23)
CO2: 29 mmol/L (ref 22–32)
Calcium: 9.3 mg/dL (ref 8.9–10.3)
Chloride: 97 mmol/L — ABNORMAL LOW (ref 98–111)
Creatinine, Ser: 0.78 mg/dL (ref 0.61–1.24)
GFR, Estimated: >60 mL/min (ref >60)
Glucose, Bld: 110 mg/dL — ABNORMAL HIGH (ref 70–99)
Potassium: 3.6 mmol/L (ref 3.5–5.1)
Sodium: 134 mmol/L — ABNORMAL LOW (ref 135–145)

## 2020-08-10 LAB — CBC
HCT: 46.4 % (ref 39.0–52.0)
Hemoglobin: 15.3 g/dL (ref 13.0–17.0)
MCH: 31.3 pg (ref 26.0–34.0)
MCHC: 33 g/dL (ref 30.0–36.0)
MCV: 94.9 fL (ref 80.0–100.0)
Platelets: 320 10*3/uL (ref 150–400)
RBC: 4.89 MIL/uL (ref 4.22–5.81)
RDW: 15.5 % (ref 11.5–15.5)
WBC: 13.3 10*3/uL — ABNORMAL HIGH (ref 4.0–10.5)
nRBC: 0 % (ref 0.0–0.2)

## 2020-08-10 LAB — SURGICAL PCR SCREEN
MRSA, PCR: NEGATIVE
Staphylococcus aureus: POSITIVE — AB

## 2020-08-10 NOTE — Progress Notes (Signed)
PCR results sent to Dr. Olin. 

## 2020-08-10 NOTE — Progress Notes (Signed)
   08/10/20 0833  OBSTRUCTIVE SLEEP APNEA  Have you ever been diagnosed with sleep apnea through a sleep study? No  Do you snore loudly (loud enough to be heard through closed doors)?  0  Do you often feel tired, fatigued, or sleepy during the daytime (such as falling asleep during driving or talking to someone)? 0  Has anyone observed you stop breathing during your sleep? 0  Do you have, or are you being treated for high blood pressure? 1  BMI more than 35 kg/m2? 1  Age > 50 (1-yes) 1  Neck circumference greater than:Male 16 inches or larger, Male 17inches or larger? 1  Male Gender (Yes=1) 1  Obstructive Sleep Apnea Score 5  Score 5 or greater  Results sent to PCP

## 2020-08-10 NOTE — Progress Notes (Signed)
ARIHAN, VANDECAR ([id number]) Visit Report for 08/09/2020 Chief Complaint Document Details Patient Name: Date of Service: Nathan Nicholson 08/09/2020 2:45 PM Medical Record Number: [id number] Patient Account Number: [id number] Date of Birth/Sex: Treating RN: 1955-08-01 (65 y.o. Male) Zandra Abts Primary Care Provider: Shela Commons HN Other Clinician: Referring Provider: Treating Provider/Extender: Deretha Emory in Treatment: 0 Information Obtained from: Patient Chief Complaint Scrotal wound Electronic Signature(s) Signed: 08/10/2020 12:29:09 PM By: Geralyn Corwin DO Entered By: Geralyn Corwin on 08/09/2020 15:37:48 -------------------------------------------------------------------------------- HPI Details Patient Name: Date of Service: Nathan Nicholson. 08/09/2020 2:45 PM Medical Record Number: [id number] Patient Account Number: [id number] Date of Birth/Sex: Treating RN: 01-22-1956 (65 y.o. Male) Zandra Abts Primary Care Provider: Shela Commons HN Other Clinician: Referring Provider: Treating Provider/Extender: Deretha Emory in Treatment: 0 History of Present Illness HPI Description: Admission 6/6 Nathan Nicholson is a 65 year old male with a past medical history of rheumatoid arthritis and COPD that presents to our clinic for a small wound to his scrotum. He was referred by Dr. Jettie Pagan from [facility] urology. This has been an ongoing issue for the patient For several years. He states he developed pruritus and increased irritation to the area and he scratched the area incessantly and developed a large wound. However The wound has been well-healing with the use of steroid cream and barrier cream. He is currently using Eucerin. He denies signs of infection. Electronic Signature(s) Signed: 08/10/2020 12:29:09 PM By: Geralyn Corwin DO Entered By: Geralyn Corwin on 08/10/2020  12:28:46 -------------------------------------------------------------------------------- Physical Exam Details Patient Name: Date of Service: Nathan Nicholson 08/09/2020 2:45 PM Medical Record Number: [id number] Patient Account Number: [id number] Date of Birth/Sex: Treating RN: Jul 10, 1955 (65 y.o. Male) Zandra Abts Primary Care Provider: Shela Commons HN Other Clinician: Referring Provider: Treating Provider/Extender: Deretha Emory in Treatment: 0 Constitutional respirations regular, non-labored and within target range for patient.Marland Kitchen Psychiatric pleasant and cooperative. Notes Scrotum: On the left side there is a very small open wound with granulation tissue present. There are no signs of infection. Electronic Signature(s) Signed: 08/10/2020 12:29:09 PM By: Geralyn Corwin DO Entered By: Geralyn Corwin on 08/09/2020 15:42:53 -------------------------------------------------------------------------------- Physician Orders Details Patient Name: Date of Service: Nathan Nicholson. 08/09/2020 2:45 PM Medical Record Number: [id number] Patient Account Number: [id number] Date of Birth/Sex: Treating RN: 1955-04-22 (65 y.o. Male) Fonnie Mu Primary Care Provider: Shela Commons HN Other Clinician: Referring Provider: Treating Provider/Extender: Deretha Emory in Treatment: 0 Verbal / Phone Orders: No Diagnosis Coding ICD-10 Coding Code Description S31.30XA Unspecified open wound of scrotum and testes, initial encounter J44.9 Chronic obstructive pulmonary disease, unspecified M05.79 Rheumatoid arthritis with rheumatoid factor of multiple sites without organ or systems involvement Follow-up Appointments Return appointment in 1 month. Bathing/ Shower/ Hygiene May shower and wash wound with soap and water. Wound Treatment Wound #1 - Scrotum Cleanser: Soap and Water 1 x Per Day/7 Days Discharge Instructions: May shower and wash wound with  dial antibacterial soap and water prior to dressing change. Topical: Eucerin cream 1 x Per Day/7 Days Discharge Instructions: Apply to wound bed daily Electronic Signature(s) Signed: 08/10/2020 12:29:09 PM By: Geralyn Corwin DO Entered By: Geralyn Corwin on 08/09/2020 15:43:07 -------------------------------------------------------------------------------- Problem List Details Patient Name: Date of Service: Nathan Nicholson, Nathan N L. 08/09/2020 2:45 PM Medical Record Number: [id number] Patient Account Number: [id number] Date of Birth/Sex: Treating RN: 08-Apr-1955 (65  y.o. Male) Zandra Abts Primary Care Provider: Shela Commons HN Other Clinician: Referring Provider: Treating Provider/Extender: Deretha Emory in Treatment: 0 Active Problems ICD-10 Encounter Code Description Active Date MDM Diagnosis S31.30XA Unspecified open wound of scrotum and testes, initial encounter 08/09/2020 No Yes J44.9 Chronic obstructive pulmonary disease, unspecified 08/09/2020 No Yes M05.79 Rheumatoid arthritis with rheumatoid factor of multiple sites without organ or 08/09/2020 No Yes systems involvement Inactive Problems Resolved Problems Electronic Signature(s) Signed: 08/10/2020 12:29:09 PM By: Geralyn Corwin DO Entered By: Geralyn Corwin on 08/09/2020 15:37:26 -------------------------------------------------------------------------------- Progress Note Details Patient Name: Date of Service: Nathan Chuck L. 08/09/2020 2:45 PM Medical Record Number: [id number] Patient Account Number: [id number] Date of Birth/Sex: Treating RN: Jul 18, 1955 (65 y.o. Male) Zandra Abts Primary Care Provider: Shela Commons HN Other Clinician: Referring Provider: Treating Provider/Extender: Deretha Emory in Treatment: 0 Subjective Chief Complaint Information obtained from Patient Scrotal wound History of Present Illness (HPI) Admission 6/6 [name] is a 65 year old  male with a past medical history of rheumatoid arthritis and COPD that presents to our clinic for a small wound to his scrotum. He was referred by Dr. Jettie Pagan from [facility] urology. This has been an ongoing issue for the patient For several years. However The wound has been well-healing with the use of steroid cream and barrier cream. He is currently using Eucerin. He denies signs of infection. Patient History Information obtained from Patient. Allergies No Known Allergies Family History Unknown History. Social History Current every day smoker, Marital Status - Married, Alcohol Use - Never, Drug Use - No History, Caffeine Use - Moderate. Medical History Respiratory Patient has history of Chronic Obstructive Pulmonary Disease (COPD) Denies history of Aspiration, Asthma, Pneumothorax, Sleep Apnea, Tuberculosis Cardiovascular Patient has history of Hypertension, Phlebitis Denies history of Angina, Arrhythmia, Congestive Heart Failure, Coronary Artery Disease, Deep Vein Thrombosis, Hypotension, Myocardial Infarction, Peripheral Arterial Disease, Peripheral Venous Disease, Vasculitis Integumentary (Skin) Denies history of History of Burn Musculoskeletal Patient has history of Osteoarthritis Denies history of Gout, Rheumatoid Arthritis, Osteomyelitis Hospitalization/Surgery History - foot surery. - hernia surgery. - splenectomy. Medical A Surgical History Notes nd Cardiovascular hypercholesterolemia Review of Systems (ROS) Constitutional Symptoms (General Health) Denies complaints or symptoms of Fatigue, Fever, Chills, Marked Weight Change. Eyes Denies complaints or symptoms of Dry Eyes, Vision Changes, Glasses / Contacts. Ear/Nose/Mouth/Throat Denies complaints or symptoms of Chronic sinus problems or rhinitis. Respiratory Denies complaints or symptoms of Chronic or frequent coughs, Shortness of Breath. Cardiovascular Denies complaints or symptoms of Chest  pain. Gastrointestinal Denies complaints or symptoms of Frequent diarrhea, Nausea, Vomiting. Endocrine Denies complaints or symptoms of Heat/cold intolerance. Genitourinary Denies complaints or symptoms of Frequent urination. Integumentary (Skin) Complains or has symptoms of Wounds. Musculoskeletal Denies complaints or symptoms of Muscle Pain, Muscle Weakness. Neurologic Denies complaints or symptoms of Numbness/parasthesias. Psychiatric Denies complaints or symptoms of Claustrophobia, Suicidal. Objective Constitutional respirations regular, non-labored and within target range for patient.. Vitals Time Taken: 2:39 PM, Height: 62 in, Source: Stated, Weight: 220 lbs, Source: Stated, BMI: 40.2, Temperature: 98.4 F, Pulse: 94 bpm, Respiratory Rate: 19 breaths/min, Blood Pressure: 147/85 mmHg. Psychiatric pleasant and cooperative. General Notes: Scrotum: On the left side there is a very small open wound with granulation tissue present. There are no signs of infection. Integumentary (Hair, Skin) Wound #1 status is Open. Original cause of wound was Gradually Appeared. The date acquired was: 08/10/2019. The wound is located on the Scrotum. The wound measures 0.3cm length  x 0.2cm width x 0.1cm depth; 0.047cm^2 area and 0.005cm^3 volume. There is no tunneling or undermining noted. There is a medium amount of serosanguineous drainage noted. The wound margin is distinct with the outline attached to the wound base. There is large (67-100%) red, pink granulation within the wound bed. There is no necrotic tissue within the wound bed. Assessment Active Problems ICD-10 Unspecified open wound of scrotum and testes, initial encounter Chronic obstructive pulmonary disease, unspecified Rheumatoid arthritis with rheumatoid factor of multiple sites without organ or systems involvement Patient's wound is almost closed. He is using Eucerin cream slowly and I think this is fine to continue. I recommended he  keep the surrounding area dry as well. Overall everything looks well-healing with no signs of infection. He is having a total hip replacement on 6/14. I told him he could follow-up in a month and call us sooner if he develops other symptoms. Plan Follow-up Appointments: Return appointment in 1 month. Bathing/ Shower/ Hygiene: May shower and wash wound with soap and water. WOUND #1: - Scrotum Wound Laterality: Cleanser: Soap and Water 1 x Per Day/7 Days Discharge Instructions: May shower and wash wound with dial antibacterial soap and water prior to dressing change. Topical: Eucerin cream 1 x Per Day/7 Days Discharge Instructions: Apply to wound bed daily 1. Eucerin cream 2. Follow-up in 1 month Electronic Signature(s) Signed: 08/10/2020 12:29:09 PM By: Geralyn Corwin DO Entered By: Geralyn Corwin on 08/09/2020 15:44:46 -------------------------------------------------------------------------------- HxROS Details Patient Name: Date of Service: Nathan Nicholson, Cyril Loosen. 08/09/2020 2:45 PM Medical Record Number: [id number] Patient Account Number: [id number] Date of Birth/Sex: Treating RN: April 27, 1955 (65 y.o. Male) Fonnie Mu Primary Care Provider: Shela Commons HN Other Clinician: Referring Provider: Treating Provider/Extender: Deretha Emory in Treatment: 0 Information Obtained From Patient Constitutional Symptoms (General Health) Complaints and Symptoms: Negative for: Fatigue; Fever; Chills; Marked Weight Change Eyes Complaints and Symptoms: Negative for: Dry Eyes; Vision Changes; Glasses / Contacts Ear/Nose/Mouth/Throat Complaints and Symptoms: Negative for: Chronic sinus problems or rhinitis Respiratory Complaints and Symptoms: Negative for: Chronic or frequent coughs; Shortness of Breath Medical History: Positive for: Chronic Obstructive Pulmonary Disease (COPD) Negative for: Aspiration; Asthma; Pneumothorax; Sleep Apnea;  Tuberculosis Cardiovascular Complaints and Symptoms: Negative for: Chest pain Medical History: Positive for: Hypertension; Phlebitis Negative for: Angina; Arrhythmia; Congestive Heart Failure; Coronary Artery Disease; Deep Vein Thrombosis; Hypotension; Myocardial Infarction; Peripheral Arterial Disease; Peripheral Venous Disease; Vasculitis Past Medical History Notes: hypercholesterolemia Gastrointestinal Complaints and Symptoms: Negative for: Frequent diarrhea; Nausea; Vomiting Endocrine Complaints and Symptoms: Negative for: Heat/cold intolerance Genitourinary Complaints and Symptoms: Negative for: Frequent urination Integumentary (Skin) Complaints and Symptoms: Positive for: Wounds Medical History: Negative for: History of Burn Musculoskeletal Complaints and Symptoms: Negative for: Muscle Pain; Muscle Weakness Medical History: Positive for: Osteoarthritis Negative for: Gout; Rheumatoid Arthritis; Osteomyelitis Neurologic Complaints and Symptoms: Negative for: Numbness/parasthesias Psychiatric Complaints and Symptoms: Negative for: Claustrophobia; Suicidal Hematologic/Lymphatic Immunological Oncologic Immunizations Pneumococcal Vaccine: Received Pneumococcal Vaccination: Yes Immunization Notes: pt. doesn't remember last tetanus shot Implantable Devices None Hospitalization / Surgery History Type of Hospitalization/Surgery foot surery hernia surgery splenectomy Family and Social History Unknown History: Yes; Current every day smoker; Marital Status - Married; Alcohol Use: Never; Drug Use: No History; Caffeine Use: Moderate; Financial Concerns: No; Food, Clothing or Shelter Needs: No; Support System Lacking: No; Transportation Concerns: No Electronic Signature(s) Signed: 08/09/2020 5:37:32 PM By: Fonnie Mu RN Signed: 08/10/2020 12:29:09 PM By: Geralyn Corwin DO Entered By: Fonnie Mu on 08/09/2020  14:47:59 --------------------------------------------------------------------------------  SuperBill Details Patient Name: Date of Service: Nathan Nicholson 08/09/2020 Medical Record Number: [id number] Patient Account Number: [id number] Date of Birth/Sex: Treating RN: [birth year] (65 y.o. Male) Fonnie Mu Primary Care Provider: Shela Commons HN Other Clinician: Referring Provider: Treating Provider/Extender: Deretha Emory in Treatment: 0 Diagnosis Coding ICD-10 Codes Code Description S31.30XA Unspecified open wound of scrotum and testes, initial encounter J44.9 Chronic obstructive pulmonary disease, unspecified M05.79 Rheumatoid arthritis with rheumatoid factor of multiple sites without organ or systems involvement Facility Procedures CPT4 Code: 88891694 Description: 99213 - WOUND CARE VISIT-LEV 3 EST PT Modifier: Quantity: 1 Physician Procedures : CPT4 Code Description Modifier [id number] WC PHYS LEVEL 3 NEW PT ICD-10 Diagnosis Description S31.30XA Unspecified open wound of scrotum and testes, initial encounter J44.9 Chronic obstructive pulmonary disease, unspecified M05.79 Rheumatoid arthritis  with rheumatoid factor of multiple sites without organ or systems involvement Quantity: 1 Electronic Signature(s) Signed: 08/10/2020 12:29:09 PM By: Geralyn Corwin DO Entered By: Geralyn Corwin on 08/09/2020 15:45:05

## 2020-08-10 NOTE — Progress Notes (Addendum)
  COVID Vaccine Completed: N/A Date COVID Vaccine completed: Has received booster: COVID vaccine manufacturer: Pf[employer]    [facility] [employer]   Date of COVID positive in last 90 days: N/A  PCP - [facility] Cardiologist - N/A  Chest x-ray - 03/16/20 on chart EKG - 03/16/20 ob chart Stress Test - pt states many years ago ECHO - N/A Cardiac Cath - N/A Pacemaker/ICD device last checked: Spinal Cord Stimulator:  Sleep Study - N/A CPAP -   Fasting Blood Sugar - N/A Checks Blood Sugar _____ times a day  Blood Thinner Instructions: Aspirin Instructions:  ASA 81 mg Last Dose: 2 weeks ago  Activity level:   Unable to go up flight of stairs due to hip pain. Patient does complain of SOB with exertion. Patient uses 2L O2 mainly at night sometimes during the day. Pt denies CP with activity.                            Anesthesia review:  O2, COPD, pt is a smoker, Stop Bang 5  Patient denies shortness of breath, fever, cough and chest pain at PAT appointment   Patient verbalized understanding of instructions that were given to them at the PAT appointment. Patient was also instructed that they will need to review over the PAT instructions again at home before surgery.

## 2020-08-11 ENCOUNTER — Encounter (HOSPITAL_BASED_OUTPATIENT_CLINIC_OR_DEPARTMENT_OTHER): Payer: Medicare HMO | Admitting: Physician Assistant

## 2020-08-11 NOTE — Progress Notes (Signed)
JUSTINLEE, PITRE ([id number]) Visit Report for 08/09/2020 Allergy List Details Patient Name: Date of Service: Nathan Nicholson 08/09/2020 2:45 PM Medical Record Number: [id number] Patient Account Number: [id number] Date of Birth/Sex: Treating RN: 07/02/1955 (65 y.o. Male) Fonnie Mu Primary Care Lesta Limbert: Shela Commons HN Other Clinician: Referring Teah Votaw: Treating Marquel Spoto/Extender: Deretha Emory in Treatment: 0 Allergies Active Allergies No Known Allergies Allergy Notes Electronic Signature(s) Signed: 08/09/2020 5:37:32 PM By: Fonnie Mu RN Entered By: Fonnie Mu on 08/09/2020 14:39:45 -------------------------------------------------------------------------------- Arrival Information Details Patient Name: Date of Service: Nathan Nicholson. 08/09/2020 2:45 PM Medical Record Number: [id number] Patient Account Number: [id number] Date of Birth/Sex: Treating RN: 04-05-1955 (65 y.o. Male) Fonnie Mu Primary Care Giselle Brutus: Shela Commons HN Other Clinician: Referring Salima Rumer: Treating Meganne Rita/Extender: Deretha Emory in Treatment: 0 Visit Information Patient Arrived: Wheel Chair Arrival Time: 14:38 Accompanied By: wife Transfer Assistance: None Patient Identification Verified: Yes Secondary Verification Process Completed: Yes Patient Requires Transmission-Based Precautions: No Patient Has Alerts: No Electronic Signature(s) Signed: 08/09/2020 5:37:32 PM By: Fonnie Mu RN Entered By: Fonnie Mu on 08/09/2020 14:38:37 -------------------------------------------------------------------------------- Clinic Level of Care Assessment Details Patient Name: Date of Service: Nathan Nicholson 08/09/2020 2:45 PM Medical Record Number: [id number] Patient Account Number: [id number] Date of Birth/Sex: Treating RN: 1955/03/28 (65 y.o. Male) Fonnie Mu Primary Care Ariea Rochin: Shela Commons HN Other  Clinician: Referring Zhaniya Swallows: Treating Maris Abascal/Extender: Deretha Emory in Treatment: 0 Clinic Level of Care Assessment Items TOOL 2 Quantity Score X- 1 0 Use when only an EandM is performed on the INITIAL visit ASSESSMENTS - Nursing Assessment / Reassessment X- 1 20 General Physical Exam (combine w/ comprehensive assessment (listed just below) when performed on new pt. evals) X- 1 25 Comprehensive Assessment (HX, ROS, Risk Assessments, Wounds Hx, etc.) ASSESSMENTS - Wound and Skin A ssessment / Reassessment X - Simple Wound Assessment / Reassessment - one wound 1 5 []  - 0 Complex Wound Assessment / Reassessment - multiple wounds X- 1 10 Dermatologic / Skin Assessment (not related to wound area) ASSESSMENTS - Ostomy and/or Continence Assessment and Care []  - 0 Incontinence Assessment and Management []  - 0 Ostomy Care Assessment and Management (repouching, etc.) PROCESS - Coordination of Care X - Simple Patient / Family Education for ongoing care 1 15 []  - 0 Complex (extensive) Patient / Family Education for ongoing care []  - 0 Staff obtains Chiropractor, Records, T Results / Process Orders est []  - 0 Staff telephones HHA, Nursing Homes / Clarify orders / etc []  - 0 Routine Transfer to another Facility (non-emergent condition) []  - 0 Routine Hospital Admission (non-emergent condition) []  - 0 New Admissions / Manufacturing engineer / Ordering NPWT Apligraf, etc. , []  - 0 Emergency Hospital Admission (emergent condition) X- 1 10 Simple Discharge Coordination []  - 0 Complex (extensive) Discharge Coordination PROCESS - Special Needs []  - 0 Pediatric / Minor Patient Management []  - 0 Isolation Patient Management []  - 0 Hearing / Language / Visual special needs []  - 0 Assessment of Community assistance (transportation, D/C planning, etc.) []  - 0 Additional assistance / Altered mentation []  - 0 Support Surface(s) Assessment (bed, cushion, seat,  etc.) INTERVENTIONS - Wound Cleansing / Measurement X- 1 5 Wound Imaging (photographs - any number of wounds) []  - 0 Wound Tracing (instead of photographs) X- 1 5 Simple Wound Measurement - one wound []  - 0 Complex Wound Measurement - multiple wounds X-  1 5 Simple Wound Cleansing - one wound []  - 0 Complex Wound Cleansing - multiple wounds INTERVENTIONS - Wound Dressings X - Small Wound Dressing one or multiple wounds 1 10 []  - 0 Medium Wound Dressing one or multiple wounds []  - 0 Large Wound Dressing one or multiple wounds []  - 0 Application of Medications - injection INTERVENTIONS - Miscellaneous []  - 0 External ear exam []  - 0 Specimen Collection (cultures, biopsies, blood, body fluids, etc.) []  - 0 Specimen(s) / Culture(s) sent or taken to Lab for analysis []  - 0 Patient Transfer (multiple staff / Lift / Similar devices) []  - 0 Simple Staple / Suture removal (25 or less) []  - 0 Complex Staple / Suture removal (26 or more) []  - 0 Hypo / Hyperglycemic Management (close monitor of Blood Glucose) []  - 0 Ankle / Brachial Index (ABI) - do not check if billed separately Has the patient been seen at the hospital within the last three years: Yes Total Score: 110 Level Of Care: New/Established - Level 3 Electronic Signature(s) Signed: 08/09/2020 5:37:32 PM By: RN Entered By: on 08/09/2020 15:26:14 -------------------------------------------------------------------------------- Lower Extremity Assessment Details Patient Name: Date of Service: L. 08/09/2020 2:45 PM Medical Record Number: Patient Account Number: Michiel Sites Date of Birth/Sex: Treating RN: Oct 01, 1955 (65 y.o. Male) Primary Care Alaena Strader: HN Other Clinician: Referring Irina Okelly: Treating Tanay Massiah/Extender: 10/09/2020 in Treatment: 0 Notes NO LE WOUNDS Electronic Signature(s) Signed:  08/09/2020 5:37:32 PM By: Fonnie Mu RN Entered By: 10/09/2020 on 08/09/2020 14:41:22 -------------------------------------------------------------------------------- Multi Wound Chart Details Patient Name: Date of Service: 10/09/2020, [id number] L. 08/09/2020 2:45 PM Medical Record Number: 04/05/1955 Patient Account Number: [id number] Date of Birth/Sex: Treating RN: 1956/02/22 (65 y.o. Male) Deretha Emory Primary Care [place]aka Latka: 10/09/2020 HN Other Clinician: Referring Jashon Ishida: Treating Kamori Kitchens/Extender: Fonnie Mu in Treatment: 0 Vital Signs Height(in): 62 Pulse(bpm): 94 Weight(lbs): 220 Blood Pressure(mmHg): 147/85 Body Mass Index(BMI): 40 Temperature(F): 98.4 Respiratory Rate(breaths/min): 19 Photos: [1:No Photos Scrotum] [N/A:N/A N/A] Wound Location: [1:Gradually Appeared] [N/A:N/A] Wounding Event: [1:Skin T ear] [N/A:N/A] Primary Etiology: [1:Chronic Obstructive Pulmonary] [N/A:N/A] Comorbid History: [1:Disease (COPD), Hypertension, Phlebitis, Osteoarthritis 08/10/2019] [N/A:N/A] Date Acquired: [1:0] [N/A:N/A] Weeks of Treatment: [1:Open] [N/A:N/A] Wound Status: [1:0.3x0.2x0.1] [N/A:N/A] Measurements L x W x D (cm) [1:0.047] [N/A:N/A] A (cm) : rea [1:0.005] [N/A:N/A] Volume (cm) : [1:Full Thickness Without Exposed] [N/A:N/A] Classification: [1:Support Structures Medium] [N/A:N/A] Exudate Amount: [1:Serosanguineous] [N/A:N/A] Exudate Type: [1:red, brown] [N/A:N/A] Exudate Color: [1:Distinct, outline attached] [N/A:N/A] Wound Margin: [1:Large (67-100%)] [N/A:N/A] Granulation Amount: [1:Red, Pink] [N/A:N/A] Granulation Quality: [1:None Present (0%)] [N/A:N/A] Necrotic Amount: [1:Fascia: No] [N/A:N/A] Exposed Structures: [1:Fat Layer (Subcutaneous Tissue): No Tendon: No Muscle: No Joint: No Bone: No Small (1-33%)] [N/A:N/A] Treatment Notes Electronic Signature(s) Signed: 08/10/2020 12:29:09 PM By: [name] DO Signed: 08/11/2020  5:57:05 PM By: 10/09/2020 RN, BSN Entered By: [id number] on 08/09/2020 15:37:32 -------------------------------------------------------------------------------- Multi-Disciplinary Care Plan Details Patient Name: Date of Service: 04/05/1955 L. 08/09/2020 2:45 PM Medical Record Number: Zandra Abts Patient Account Number: Shela Commons Date of Birth/Sex: Treating RN: March 21, 1955 (65 y.o. Male) 10/10/2020 Primary Care Dmiya Malphrus: Geralyn Corwin HN Other Clinician: Referring Khadijatou Borak: Treating Thelma Viana/Extender: 10/11/2020 in Treatment: 0 Active Inactive Wound/Skin Impairment Nursing Diagnoses: Impaired tissue integrity Knowledge deficit related to smoking impact on wound healing Knowledge deficit related to ulceration/compromised skin integrity Goals: Patient will demonstrate  a reduced rate of smoking or cessation of smoking Date Initiated: 08/09/2020 Target Resolution Date: 09/09/2020 Goal Status: Active Patient will have a decrease in wound volume by X% from date: (specify in notes) Date Initiated: 08/09/2020 Target Resolution Date: 09/09/2020 Goal Status: Active Patient/caregiver will verbalize understanding of skin care regimen Date Initiated: 08/09/2020 Target Resolution Date: 09/10/2020 Goal Status: Active Ulcer/skin breakdown will have a volume reduction of 30% by week 4 Date Initiated: 08/09/2020 Target Resolution Date: 09/10/2020 Goal Status: Active Interventions: Assess patient/caregiver ability to obtain necessary supplies Assess patient/caregiver ability to perform ulcer/skin care regimen upon admission and as needed Assess ulceration(s) every visit Provide education on smoking Notes: Electronic Signature(s) Signed: 08/09/2020 5:37:32 PM By: Fonnie Mu RN Entered By: Fonnie Mu on 08/09/2020 15:19:27 -------------------------------------------------------------------------------- Pain Assessment Details Patient Name: Date of  Service: Docia Chuck L. 08/09/2020 2:45 PM Medical Record Number: [id number] Patient Account Number: [id number] Date of Birth/Sex: Treating RN: 1955/11/12 (65 y.o. Male) Fonnie Mu Primary Care Evalynne Locurto: Shela Commons HN Other Clinician: Referring Coran Dipaola: Treating Delfin Squillace/Extender: Deretha Emory in Treatment: 0 Active Problems Location of Pain Severity and Description of Pain Patient Has Paino No Site Locations Pain Management and Medication Current Pain Management: Electronic Signature(s) Signed: 08/09/2020 5:37:32 PM By: Fonnie Mu RN Entered By: Fonnie Mu on 08/09/2020 14:41:30 -------------------------------------------------------------------------------- Patient/Caregiver Education Details Patient Name: Date of Service: Nathan Nicholson 6/6/2022andnbsp2:45 PM Medical Record Number: [id number] Patient Account Number: [id number] Date of Birth/Gender: Treating RN: [birth year] (65 y.o. Male) Fonnie Mu Primary Care Physician: Shela Commons HN Other Clinician: Referring Physician: Treating Physician/Extender: Deretha Emory in Treatment: 0 Education Assessment Education Provided To: Patient Education Topics Provided Smoking and Wound Healing: Methods: Explain/Verbal Responses: State content correctly Venous: Wound/Skin Impairment: Methods: Explain/Verbal Responses: State content correctly Electronic Signature(s) Signed: 08/09/2020 5:37:32 PM By: Fonnie Mu RN Entered By: Fonnie Mu on 08/09/2020 15:20:09 -------------------------------------------------------------------------------- Wound Assessment Details Patient Name: Date of Service: [name], Argie Ramming L. 08/09/2020 2:45 PM Medical Record Number: [id number] Patient Account Number: [id number] Date of Birth/Sex: Treating RN: 1955-04-25 (65 y.o. Male) Fonnie Mu Primary Care Shanita Kanan: Shela Commons HN Other  Clinician: Referring Menna Abeln: Treating Zian Delair/Extender: Deretha Emory in Treatment: 0 Wound Status Wound Number: 1 Primary Skin Tear Etiology: Wound Location: Scrotum Wound Open Wounding Event: Gradually Appeared Status: Date Acquired: 08/10/2019 Comorbid Chronic Obstructive Pulmonary Disease (COPD), Hypertension, Weeks Of Treatment: 0 History: Phlebitis, Osteoarthritis Clustered Wound: No Photos Wound Measurements Length: (cm) 0.3 Width: (cm) 0.2 Depth: (cm) 0.1 Area: (cm) 0.047 Volume: (cm) 0.005 % Reduction in Area: 0% % Reduction in Volume: 0% Epithelialization: Small (1-33%) Tunneling: No Undermining: No Wound Description Classification: Full Thickness Without Exposed Support Structures Wound Margin: Distinct, outline attached Exudate Amount: Medium Exudate Type: Serosanguineous Exudate Color: red, brown Foul Odor After Cleansing: No Slough/Fibrino No Wound Bed Granulation Amount: Large (67-100%) Exposed Structure Granulation Quality: Red, Pink Fascia Exposed: No Necrotic Amount: None Present (0%) Fat Layer (Subcutaneous Tissue) Exposed: No Tendon Exposed: No Muscle Exposed: No Joint Exposed: No Bone Exposed: No Electronic Signature(s) Signed: 08/09/2020 5:37:32 PM By: Fonnie Mu RN Signed: 08/10/2020 2:13:29 PM By: Karl Ito Entered By: Karl Ito on 08/09/2020 16:50:17 -------------------------------------------------------------------------------- Vitals Details Patient Name: Date of Service: [name], Argie Ramming L. 08/09/2020 2:45 PM Medical Record Number: [id number] Patient Account Number: [id number] Date of Birth/Sex: Treating RN: 03/05/1956 (65 y.o. Male) Fonnie Mu Primary Care Verenise Moulin: Shela Commons  HN Other Clinician: Referring Hilery Wintle: Treating Joyanne Eddinger/Extender: Deretha Emory in Treatment: 0 Vital Signs Time Taken: 14:39 Temperature (F): 98.4 Height (in): 62 Pulse  (bpm): 94 Source: Stated Respiratory Rate (breaths/min): 19 Weight (lbs): 220 Blood Pressure (mmHg): 147/85 Source: Stated Reference Range: 80 - 120 mg / dl Body Mass Index (BMI): 40.2 Electronic Signature(s) Signed: 08/09/2020 5:37:32 PM By: Fonnie Mu RN Entered By: Fonnie Mu on 08/09/2020 14:39:35

## 2020-08-12 DIAGNOSIS — Z716 Tobacco abuse counseling: Secondary | ICD-10-CM | POA: Diagnosis not present

## 2020-08-12 DIAGNOSIS — E538 Deficiency of other specified B group vitamins: Secondary | ICD-10-CM | POA: Diagnosis not present

## 2020-08-12 DIAGNOSIS — E782 Mixed hyperlipidemia: Secondary | ICD-10-CM | POA: Diagnosis not present

## 2020-08-12 DIAGNOSIS — Z6838 Body mass index (BMI) 38.0-38.9, adult: Secondary | ICD-10-CM | POA: Diagnosis not present

## 2020-08-12 DIAGNOSIS — Z01818 Encounter for other preprocedural examination: Secondary | ICD-10-CM | POA: Diagnosis not present

## 2020-08-12 DIAGNOSIS — I10 Essential (primary) hypertension: Secondary | ICD-10-CM | POA: Diagnosis not present

## 2020-08-12 DIAGNOSIS — J449 Chronic obstructive pulmonary disease, unspecified: Secondary | ICD-10-CM | POA: Diagnosis not present

## 2020-08-12 DIAGNOSIS — R739 Hyperglycemia, unspecified: Secondary | ICD-10-CM | POA: Diagnosis not present

## 2020-08-12 DIAGNOSIS — E559 Vitamin D deficiency, unspecified: Secondary | ICD-10-CM | POA: Diagnosis not present

## 2020-08-12 DIAGNOSIS — M069 Rheumatoid arthritis, unspecified: Secondary | ICD-10-CM | POA: Diagnosis not present

## 2020-08-13 NOTE — Anesthesia Preprocedure Evaluation (Addendum)
Anesthesia Evaluation  Patient identified by MRN, date of birth, ID band Patient awake    Reviewed: Allergy & Precautions, NPO status , Patient's Chart, lab work & pertinent test results, reviewed documented beta blocker date and time   History of Anesthesia Complications Negative for: history of anesthetic complications  Airway Mallampati: I  TM Distance: >3 FB Neck ROM: Full    Dental  (+) Dental Advisory Given, Edentulous Upper, Edentulous Lower, Lower Dentures, Upper Dentures   Pulmonary shortness of breath, COPD,  COPD inhaler and oxygen dependent, neg recent URI, Current Smoker and Patient abstained from smoking.,  Covid-19 Nucleic Acid Test Results Lab Results      Component                Value               Date                      [place]              NEGATIVE            08/16/2020               + decreased breath sounds      Cardiovascular hypertension, Pt. on medications and Pt. on home beta blockers (-) angina(-) Past MI  Rhythm:Regular     Neuro/Psych negative neurological ROS  negative psych ROS   GI/Hepatic negative GI ROS, Neg liver ROS,   Endo/Other  negative endocrine ROS  Renal/GU negative Renal ROSLab Results      Component                Value               Date                      CREATININE               0.78                08/10/2020                Musculoskeletal  (+) Arthritis ,   Abdominal   Peds  Hematology negative hematology ROS (+) Lab Results      Component                Value               Date                      WBC                      13.3 (H)            08/10/2020                HGB                      15.3                08/10/2020                HCT                      46.4                08/10/2020  MCV                      94.9                08/10/2020                PLT                      320                 08/10/2020            Denies  blood thinners   Anesthesia Other Findings   Reproductive/Obstetrics                            Anesthesia Physical Anesthesia Plan  ASA: 3  Anesthesia Plan: MAC and Spinal   Post-op Pain Management:    Induction: Intravenous  PONV Risk Score and Plan: 0 and Propofol infusion and Treatment may vary due to age or medical condition  Airway Management Planned: Nasal Cannula  Additional Equipment: None  Intra-op Plan:   Post-operative Plan:   Informed Consent: I have reviewed the patients History and Physical, chart, labs and discussed the procedure including the risks, benefits and alternatives for the proposed anesthesia with the patient or authorized representative who has indicated his/her understanding and acceptance.     Dental advisory given  Plan Discussed with: CRNA and Surgeon  Anesthesia Plan Comments: (See APP note by Durel Salts, FNP )       Anesthesia Quick Evaluation

## 2020-08-13 NOTE — Progress Notes (Signed)
Anesthesia Chart Review:   Case: [id number] Date/Time: 08/17/20 0955   Procedure: TOTAL HIP ARTHROPLASTY ANTERIOR APPROACH (Right: Hip) - 70 min   Anesthesia type: Spinal   Pre-op diagnosis: Right hip osteoarthritis   Location: [facility] ROOM 09 / [facility]   Surgeons: Durene Romans, MD       DISCUSSION: Pt is 65 years old with a hx HTN, COPD, uses O2 at night and prn, RA.   Sleep apnea screen (STOP-BANG) at presurgical testing showed pt at risk for OSA; results sent to PCP   VS: BP 124/77   Pulse 69   Temp 36.8 C (Oral)   Resp 20   Ht 5\' 2"  (1.575 m)   Wt 96.4 kg   SpO2 95%   BMI 38.87 kg/m   PROVIDERS: - PCP is , FNP   LABS: Labs reviewed: Acceptable for surgery. (all labs ordered are listed, but only abnormal results are displayed)  Labs Reviewed  SURGICAL PCR SCREEN - Abnormal; Notable for the following components:      Result Value   Staphylococcus aureus POSITIVE (*)    All other components within normal limits  BASIC METABOLIC PANEL - Abnormal; Notable for the following components:   Sodium 134 (*)    Chloride 97 (*)    Glucose, Bld 110 (*)    All other components within normal limits  CBC - Abnormal; Notable for the following components:   WBC 13.3 (*)    All other components within normal limits  TYPE AND SCREEN     IMAGES: CXR 03/16/20 [facility]):  - hazy right infrahilar atelectasis or minimal infiltrate. Linear scarring or atelectasis at the left CP angle   EKG 03/16/20: NSR    CV:  N/A  Past Medical History:  Diagnosis Date   COPD (chronic obstructive pulmonary disease) (HCC)    COVID-19    03-2020   Dyspnea    with exertion   Hypertension    Pneumonia    Rheumatoid arthritis (HCC)     Past Surgical History:  Procedure Laterality Date   FOOT SURGERY     left   HERNIA REPAIR     SPLENECTOMY     TOOTH EXTRACTION      MEDICATIONS:  albuterol (PROVENTIL) (2.5 MG/3ML) 0.083% nebulizer solution   aspirin EC 81 MG  tablet   atorvastatin (LIPITOR) 10 MG tablet   budesonide (PULMICORT) 0.5 MG/2ML nebulizer solution   cyanocobalamin (,VITAMIN B-12,) 1000 MCG/ML injection   Fluticasone-Salmeterol (ADVAIR) 250-50 MCG/DOSE AEPB   folic acid (FOLVITE) 1 MG tablet   ipratropium-albuterol (DUONEB) 0.5-2.5 (3) MG/3ML SOLN   lisinopril-hydrochlorothiazide (PRINZIDE,ZESTORETIC) 20-12.5 MG per tablet   methotrexate (RHEUMATREX) 2.5 MG tablet   metoprolol (LOPRESSOR) 100 MG tablet   OXYGEN   Skin Protectants, Misc. (EUCERIN) cream   Vitamin D, Ergocalciferol, (DRISDOL) 1.25 MG (50000 UNIT) CAPS capsule   No current facility-administered medications for this encounter.    If no changes, I anticipate pt can proceed with surgery as scheduled.   04-2020, PhD, FNP-BC [facility]Anesthesiology Phone: [phone] 08/13/2020 3:58 PM

## 2020-08-16 ENCOUNTER — Other Ambulatory Visit (HOSPITAL_COMMUNITY)
Admission: RE | Admit: 2020-08-16 | Discharge: 2020-08-16 | Disposition: A | Discharge status: Home / Self Care | Payer: Medicare HMO | Source: Ambulatory Visit | Attending: Orthopedic Surgery | Admitting: Orthopedic Surgery

## 2020-08-16 DIAGNOSIS — Z01812 Encounter for preprocedural laboratory examination: Secondary | ICD-10-CM | POA: Insufficient documentation

## 2020-08-16 DIAGNOSIS — Z20822 Contact with and (suspected) exposure to covid-19: Secondary | ICD-10-CM | POA: Insufficient documentation

## 2020-08-16 LAB — SARS CORONAVIRUS 2 (TAT 6-24 HRS): SARS Coronavirus 2: NEGATIVE

## 2020-08-16 NOTE — H&P (Signed)
TOTAL HIP ADMISSION H&P  Patient is admitted for right total hip arthroplasty.  Subjective:  Chief Complaint: right hip pain  HPI: Nathan Nicholson, 65 y.o. male, has a history of pain and functional disability in the right hip(s) due to arthritis and patient has failed non-surgical conservative treatments for greater than 12 weeks to include NSAID's and/or analgesics, corticosteriod injections, and activity modification.  Onset of symptoms was gradual starting >10 years ago with gradually worsening course since that time.The patient noted no past surgery on the right hip(s).  Patient currently rates pain in the right hip at 8 out of 10 with activity. Patient has worsening of pain with activity and weight bearing, pain that interfers with activities of daily living, and pain with passive range of motion. Patient has evidence of joint space narrowing by imaging studies. This condition presents safety issues increasing the risk of falls. .  There is no current active infection.  There are no problems to display for this patient.  Past Medical History:  Diagnosis Date   COPD (chronic obstructive pulmonary disease) (HCC)    COVID-19    03-2020   Dyspnea    with exertion   Hypertension    Pneumonia    Rheumatoid arthritis (HCC)     Past Surgical History:  Procedure Laterality Date   FOOT SURGERY     left   HERNIA REPAIR     SPLENECTOMY     TOOTH EXTRACTION      No current facility-administered medications for this encounter.   Current Outpatient Medications  Medication Sig Dispense Refill Last Dose   albuterol (PROVENTIL) (2.5 MG/3ML) 0.083% nebulizer solution Take 2.5 mg by nebulization every 6 (six) hours as needed for wheezing or shortness of breath.      aspirin EC 81 MG tablet Take 81 mg by mouth daily. Swallow whole.      atorvastatin (LIPITOR) 10 MG tablet Take 10 mg by mouth daily.      budesonide (PULMICORT) 0.5 MG/2ML nebulizer solution Take 0.5 mg by nebulization 2 (two)  times daily.      cyanocobalamin (,VITAMIN B-12,) 1000 MCG/ML injection Inject 1,000 mcg into the muscle every 30 (thirty) days.      Fluticasone-Salmeterol (ADVAIR) 250-50 MCG/DOSE AEPB Inhale 2 puffs into the lungs 2 (two) times daily as needed (asthma).      folic acid (FOLVITE) 1 MG tablet Take 1 mg by mouth daily.      ipratropium-albuterol (DUONEB) 0.5-2.5 (3) MG/3ML SOLN Take 3 mLs by nebulization every 6 (six) hours as needed (Asthma).      lisinopril-hydrochlorothiazide (PRINZIDE,ZESTORETIC) 20-12.5 MG per tablet Take 1 tablet by mouth daily.      metoprolol (LOPRESSOR) 100 MG tablet Take 100 mg by mouth 2 (two) times daily.      OXYGEN Inhale 2 L into the lungs continuous.      Skin Protectants, Misc. (EUCERIN) cream Apply 1 application topically as needed for dry skin.      Vitamin D, Ergocalciferol, (DRISDOL) 1.25 MG (50000 UNIT) CAPS capsule Take 50,000 Units by mouth every Monday.      methotrexate (RHEUMATREX) 2.5 MG tablet Take 10 mg by mouth every Tuesday. Caution:Chemotherapy. Protect from light.      No Known Allergies  Social History   Tobacco Use   Smoking status: Some Days    Packs/day: 0.50    Years: 40.00    Pack years: 20.00    Types: Cigarettes   Smokeless tobacco: Never  Substance Use  Topics   Alcohol use: Yes    Comment: rare    No family history on file.   Review of Systems  Constitutional:  Negative for chills and fever.  Respiratory:  Negative for cough and shortness of breath.   Cardiovascular:  Negative for chest pain.  Gastrointestinal:  Negative for nausea and vomiting.  Musculoskeletal:  Positive for arthralgias.   Objective:  Physical Exam Well nourished and well developed. General: Alert and oriented x3, cooperative and pleasant, no acute distress. Head: normocephalic, atraumatic, neck supple. Eyes: EOMI.  Musculoskeletal: Right hip exam: Painful and very limited right hip range of motion with hip flexion internal rotation just beyond  neutral with external rotation to 20 degrees both reproducing pain in the anterior and anterior lateral aspect the hip. Neurovascular intact distally Left hip range of motion is normal without groin pain though there is some stiffness  Calves soft and nontender. Motor function intact in LE. Strength 5/5 LE bilaterally. Neuro: Distal pulses 2+. Sensation to light touch intact in LE.  Vital signs in last 24 hours:    Labs:   Estimated body mass index is 38.87 kg/m as calculated from the following:   Height as of 08/10/20: 5\' 2"  (1.575 m).   Weight as of 08/10/20: 96.4 kg.   Imaging Review Plain radiographs demonstrate severe degenerative joint disease of the left hip(s). The bone quality appears to be adequate for age and reported activity level.      Assessment/Plan:  End stage arthritis, left hip(s)  The patient history, physical examination, clinical judgement of the provider and imaging studies are consistent with end stage degenerative joint disease of the left hip(s) and total hip arthroplasty is deemed medically necessary. The treatment options including medical management, injection therapy, arthroscopy and arthroplasty were discussed at length. The risks and benefits of total hip arthroplasty were presented and reviewed. The risks due to aseptic loosening, infection, stiffness, dislocation/subluxation,  thromboembolic complications and other imponderables were discussed.  The patient acknowledged the explanation, agreed to proceed with the plan and consent was signed. Patient is being admitted for inpatient treatment for surgery, pain control, PT, OT, prophylactic antibiotics, VTE prophylaxis, progressive ambulation and ADL's and discharge planning.The patient is planning to be discharged  home.  Therapy Plans: HEP Disposition: Home with wife Planned DVT Prophylaxis: aspirin 81mg  BID DME needed: none PCP: 10/10/20, appointment on 6/9 TXA: IV Allergies: NKDA Anesthesia  Concerns: none BMI: 37.9 Last HgbA1c: Not diabetic  Other: - Hx of COPD - O2 at night 2L - Methotrexate - RA - Hydrocodone is okay   Mariana Arn, PA-C Orthopedic Surgery [facility] [place] [phone]

## 2020-08-17 ENCOUNTER — Encounter (HOSPITAL_COMMUNITY)
Admission: AD | Disposition: A | Discharge status: Home / Self Care | Payer: Self-pay | Source: Home / Self Care | Attending: Orthopedic Surgery

## 2020-08-17 ENCOUNTER — Ambulatory Visit (HOSPITAL_COMMUNITY): Payer: Medicare HMO | Admitting: Certified Registered Nurse Anesthetist

## 2020-08-17 ENCOUNTER — Other Ambulatory Visit: Payer: Self-pay

## 2020-08-17 ENCOUNTER — Inpatient Hospital Stay (HOSPITAL_COMMUNITY)
Admission: AD | Admit: 2020-08-17 | Discharge: 2020-08-19 | DRG: 470 | Disposition: A | Discharge status: Home / Self Care | Payer: Medicare HMO | Attending: Orthopedic Surgery | Admitting: Orthopedic Surgery

## 2020-08-17 ENCOUNTER — Encounter (HOSPITAL_COMMUNITY): Payer: Self-pay | Admitting: Orthopedic Surgery

## 2020-08-17 ENCOUNTER — Ambulatory Visit (HOSPITAL_COMMUNITY): Payer: Medicare HMO

## 2020-08-17 ENCOUNTER — Observation Stay (HOSPITAL_COMMUNITY): Payer: Medicare HMO

## 2020-08-17 ENCOUNTER — Ambulatory Visit (HOSPITAL_COMMUNITY): Payer: Medicare HMO | Admitting: Emergency Medicine

## 2020-08-17 DIAGNOSIS — Z9081 Acquired absence of spleen: Secondary | ICD-10-CM

## 2020-08-17 DIAGNOSIS — J449 Chronic obstructive pulmonary disease, unspecified: Secondary | ICD-10-CM | POA: Diagnosis not present

## 2020-08-17 DIAGNOSIS — Z9981 Dependence on supplemental oxygen: Secondary | ICD-10-CM

## 2020-08-17 DIAGNOSIS — Z20822 Contact with and (suspected) exposure to covid-19: Secondary | ICD-10-CM | POA: Diagnosis present

## 2020-08-17 DIAGNOSIS — Z8616 Personal history of COVID-19: Secondary | ICD-10-CM

## 2020-08-17 DIAGNOSIS — Z8249 Family history of ischemic heart disease and other diseases of the circulatory system: Secondary | ICD-10-CM

## 2020-08-17 DIAGNOSIS — F17210 Nicotine dependence, cigarettes, uncomplicated: Secondary | ICD-10-CM | POA: Diagnosis present

## 2020-08-17 DIAGNOSIS — I10 Essential (primary) hypertension: Secondary | ICD-10-CM | POA: Diagnosis not present

## 2020-08-17 DIAGNOSIS — Z6838 Body mass index (BMI) 38.0-38.9, adult: Secondary | ICD-10-CM

## 2020-08-17 DIAGNOSIS — E871 Hypo-osmolality and hyponatremia: Secondary | ICD-10-CM | POA: Diagnosis not present

## 2020-08-17 DIAGNOSIS — E785 Hyperlipidemia, unspecified: Secondary | ICD-10-CM | POA: Diagnosis present

## 2020-08-17 DIAGNOSIS — Z79899 Other long term (current) drug therapy: Secondary | ICD-10-CM

## 2020-08-17 DIAGNOSIS — J9611 Chronic respiratory failure with hypoxia: Secondary | ICD-10-CM | POA: Diagnosis present

## 2020-08-17 DIAGNOSIS — Z7951 Long term (current) use of inhaled steroids: Secondary | ICD-10-CM

## 2020-08-17 DIAGNOSIS — Z471 Aftercare following joint replacement surgery: Secondary | ICD-10-CM | POA: Diagnosis not present

## 2020-08-17 DIAGNOSIS — R001 Bradycardia, unspecified: Secondary | ICD-10-CM | POA: Diagnosis present

## 2020-08-17 DIAGNOSIS — E669 Obesity, unspecified: Secondary | ICD-10-CM | POA: Diagnosis not present

## 2020-08-17 DIAGNOSIS — Z96641 Presence of right artificial hip joint: Secondary | ICD-10-CM

## 2020-08-17 DIAGNOSIS — M1611 Unilateral primary osteoarthritis, right hip: Secondary | ICD-10-CM | POA: Diagnosis not present

## 2020-08-17 DIAGNOSIS — Z7982 Long term (current) use of aspirin: Secondary | ICD-10-CM

## 2020-08-17 DIAGNOSIS — E876 Hypokalemia: Secondary | ICD-10-CM | POA: Diagnosis present

## 2020-08-17 DIAGNOSIS — J189 Pneumonia, unspecified organism: Secondary | ICD-10-CM | POA: Diagnosis not present

## 2020-08-17 DIAGNOSIS — Z96649 Presence of unspecified artificial hip joint: Secondary | ICD-10-CM

## 2020-08-17 DIAGNOSIS — M069 Rheumatoid arthritis, unspecified: Secondary | ICD-10-CM | POA: Diagnosis present

## 2020-08-17 DIAGNOSIS — D72829 Elevated white blood cell count, unspecified: Secondary | ICD-10-CM | POA: Diagnosis present

## 2020-08-17 DIAGNOSIS — S72041A Displaced fracture of base of neck of right femur, initial encounter for closed fracture: Secondary | ICD-10-CM

## 2020-08-17 HISTORY — PX: TOTAL HIP ARTHROPLASTY: SHX124

## 2020-08-17 LAB — COMPREHENSIVE METABOLIC PANEL
ALT: 18 U/L (ref 0–44)
AST: 23 U/L (ref 15–41)
Albumin: 3 g/dL — ABNORMAL LOW (ref 3.5–5.0)
Alkaline Phosphatase: 78 U/L (ref 38–126)
Anion gap: 9 (ref 5–15)
BUN: 9 mg/dL (ref 8–23)
CO2: 28 mmol/L (ref 22–32)
Calcium: 8.3 mg/dL — ABNORMAL LOW (ref 8.9–10.3)
Chloride: 99 mmol/L (ref 98–111)
Creatinine, Ser: 0.86 mg/dL (ref 0.61–1.24)
GFR, Estimated: >60 mL/min (ref >60)
Glucose, Bld: 124 mg/dL — ABNORMAL HIGH (ref 70–99)
Potassium: 3.3 mmol/L — ABNORMAL LOW (ref 3.5–5.1)
Sodium: 136 mmol/L (ref 135–145)
Total Bilirubin: 0.7 mg/dL (ref 0.3–1.2)
Total Protein: 6.5 g/dL (ref 6.5–8.1)

## 2020-08-17 LAB — CBC
HCT: 41 % (ref 39.0–52.0)
Hemoglobin: 13.1 g/dL (ref 13.0–17.0)
MCH: 31 pg (ref 26.0–34.0)
MCHC: 32 g/dL (ref 30.0–36.0)
MCV: 97.2 fL (ref 80.0–100.0)
Platelets: 287 10*3/uL (ref 150–400)
RBC: 4.22 MIL/uL (ref 4.22–5.81)
RDW: 15 % (ref 11.5–15.5)
WBC: 19.6 10*3/uL — ABNORMAL HIGH (ref 4.0–10.5)
nRBC: 0 % (ref 0.0–0.2)

## 2020-08-17 LAB — PROTIME-INR
INR: 1.1 (ref 0.8–1.2)
Prothrombin Time: 14.2 seconds (ref 11.4–15.2)

## 2020-08-17 LAB — APTT: aPTT: 28 seconds (ref 24–36)

## 2020-08-17 LAB — TYPE AND SCREEN
ABO/RH(D): A POS
Antibody Screen: NEGATIVE

## 2020-08-17 LAB — ABO/RH: ABO/RH(D): A POS

## 2020-08-17 IMAGING — DX DG PORTABLE PELVIS
1 series · 1 of 1 positions shown · non-contrast
Comparison: Intraoperative imaging today.

CLINICAL DATA: Status post right hip replacement today.

EXAM:
PORTABLE PELVIS 1-2 VIEWS

[pelvis ap]
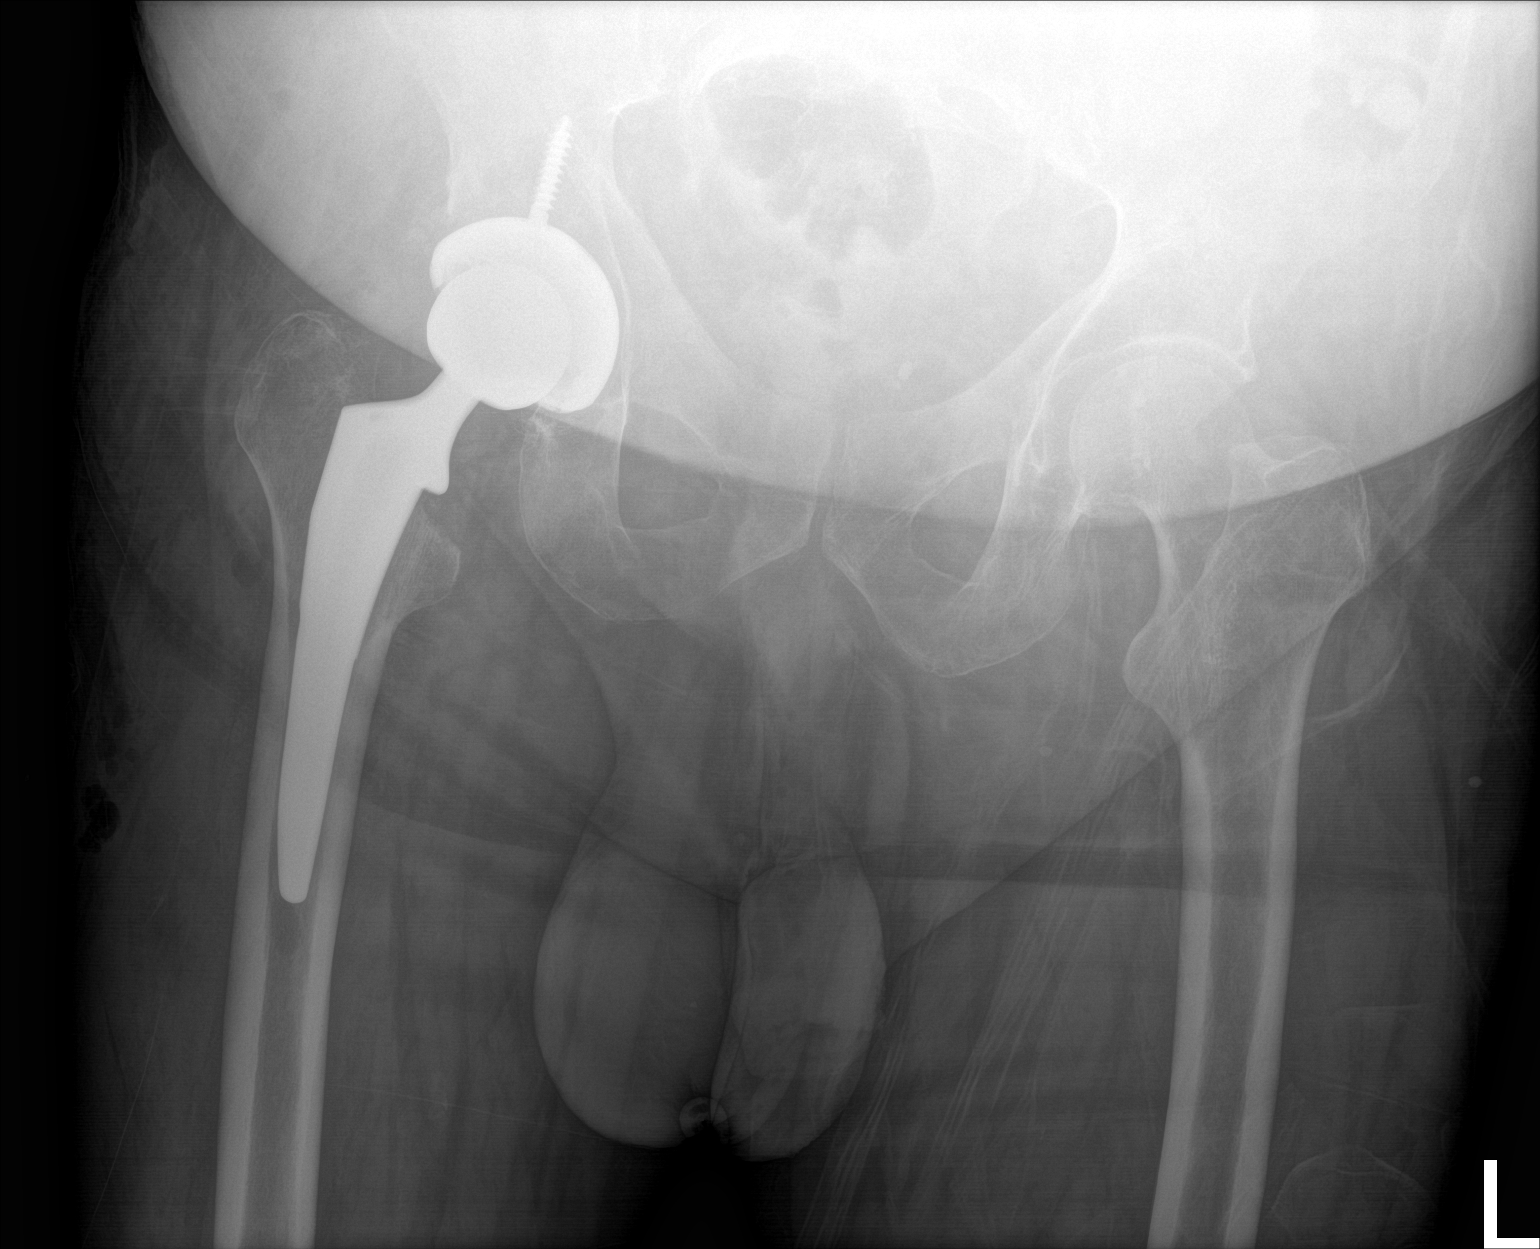

[1 of 1 positions shown; findings below may reference images not displayed]

FINDINGS: Right total hip arthroplasty is in place. No hardware complication
or acute bony or joint abnormality is seen. There is some gas in the
soft tissues from surgery.
IMPRESSION: Status post right hip replacement.  No acute finding.

## 2020-08-17 IMAGING — RF DG HIP (WITH PELVIS) OPERATIVE*R*
1 series · 6 of 6 positions shown · non-contrast
Comparison: None.

CLINICAL DATA: Hip replacement.

EXAM:
OPERATIVE RIGHT HIP (WITH PELVIS IF PERFORMED) 6 VIEWS
TECHNIQUE: Fluoroscopic spot image(s) were submitted for interpretation
post-operatively.

[Series 1: unknown protocol · 0.20mm/px · 6 of 6 slices shown]
[im 1/6]
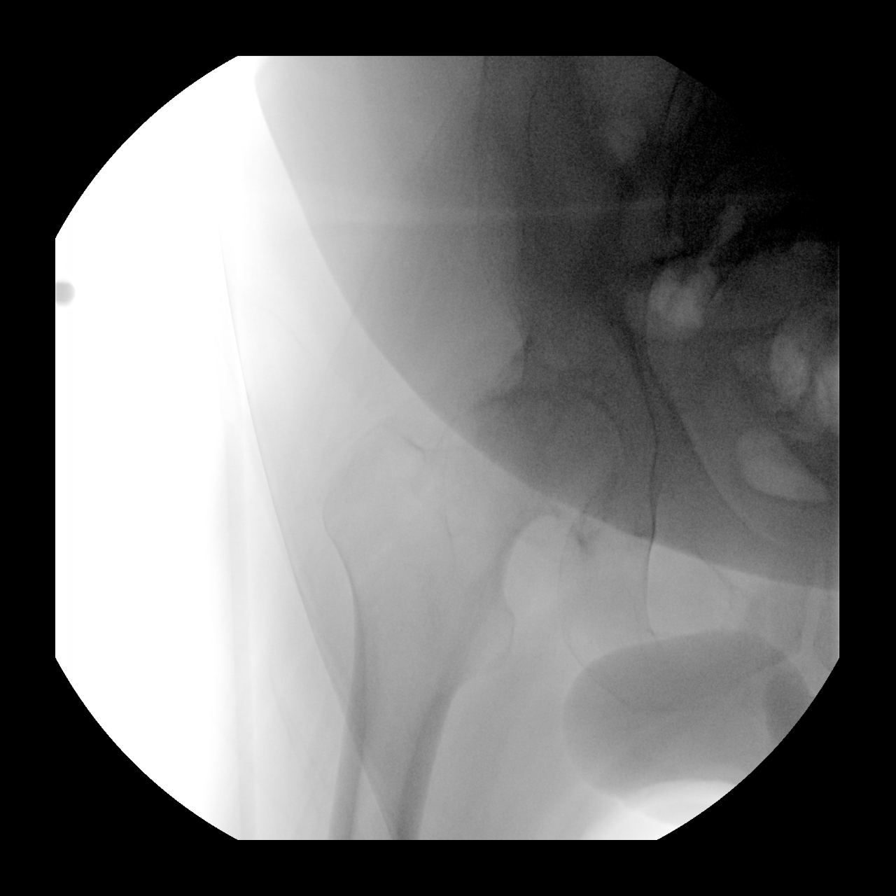
[im 2/6]
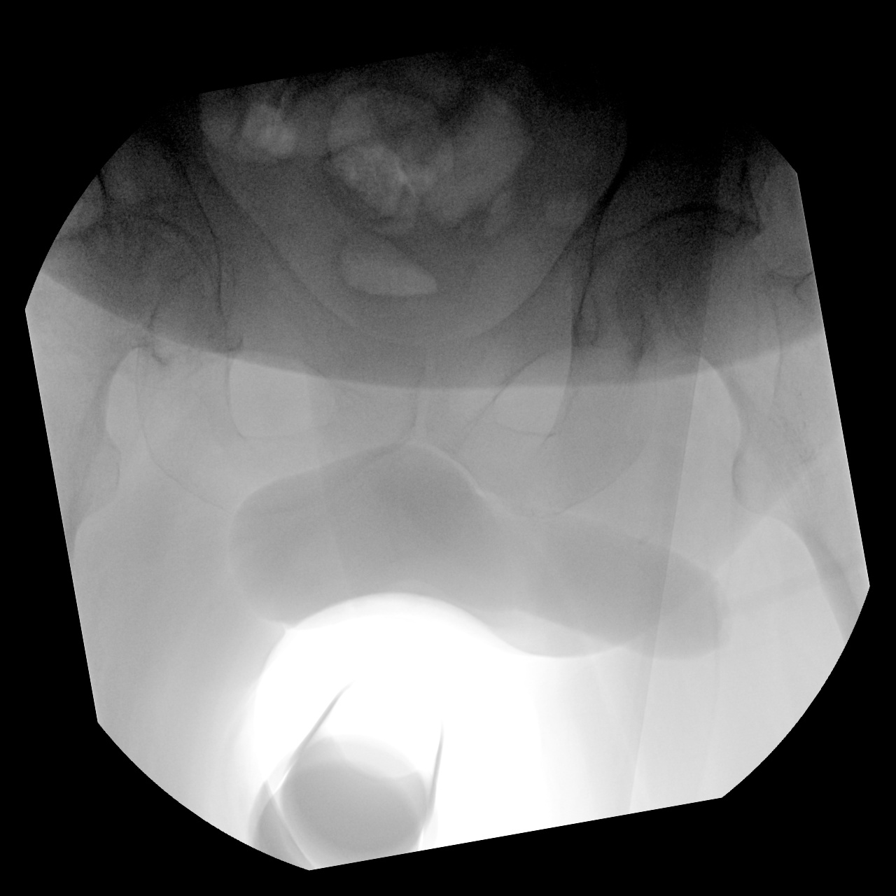
[im 3/6]
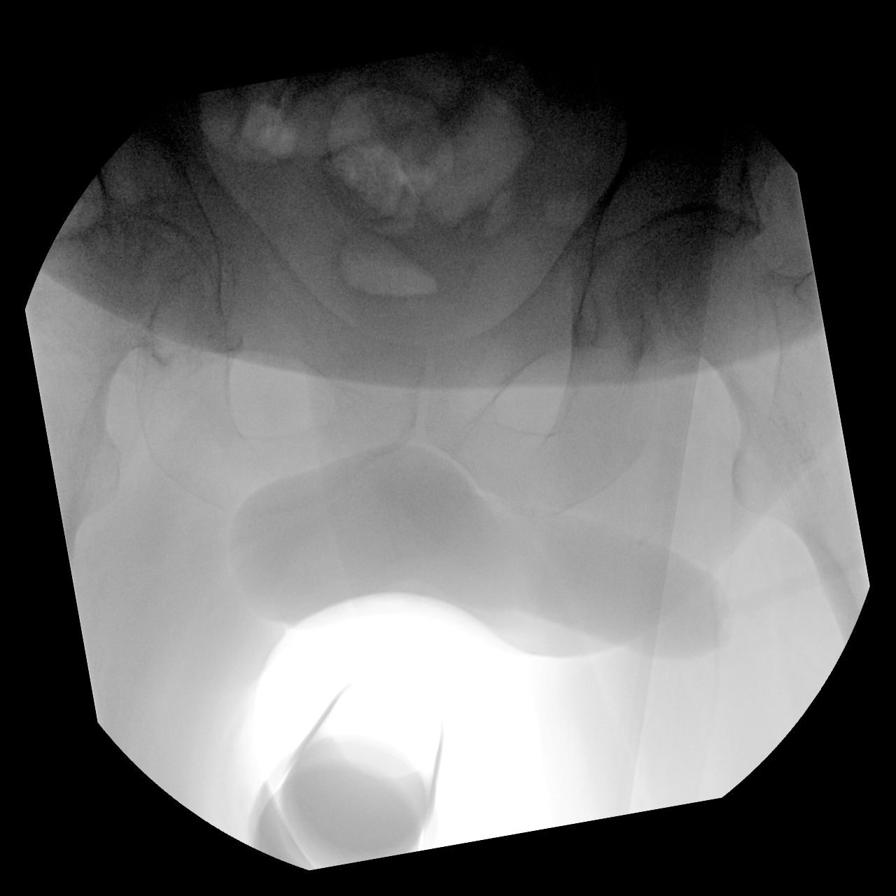
[im 4/6]
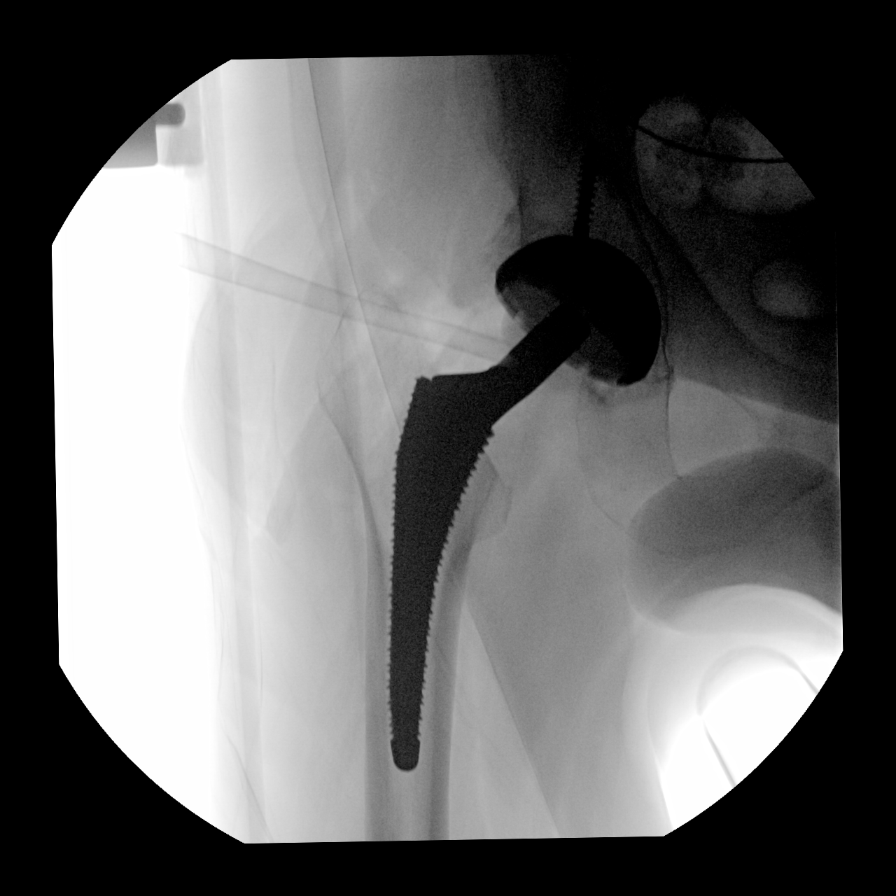
[im 5/6]
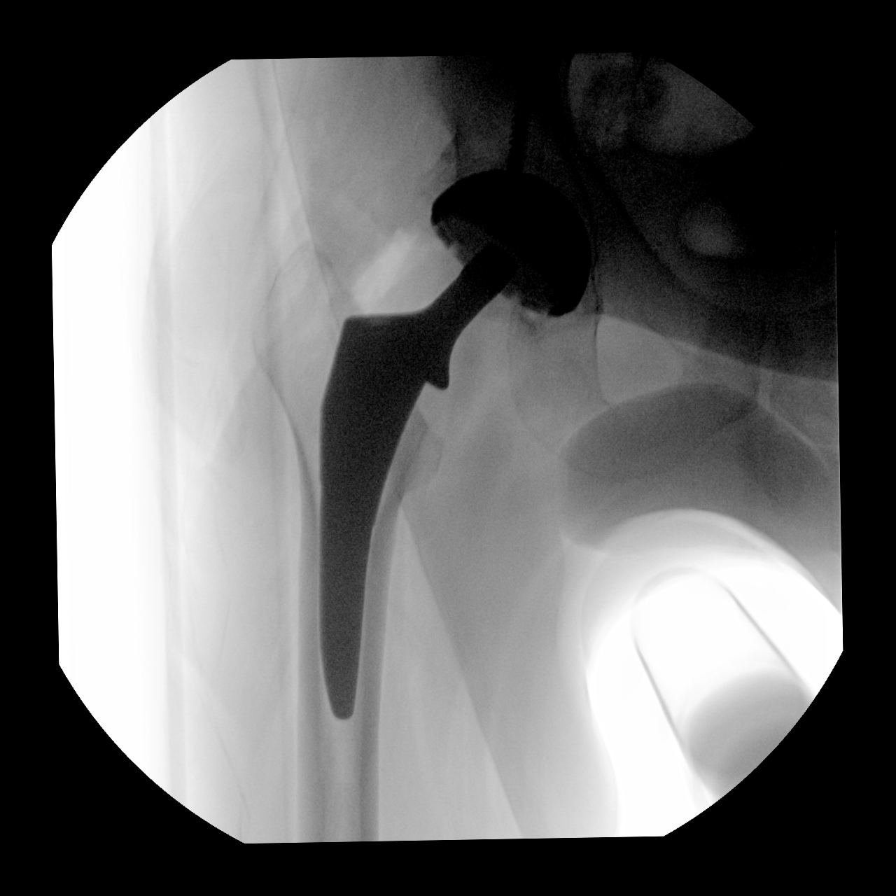
[im 6/6]
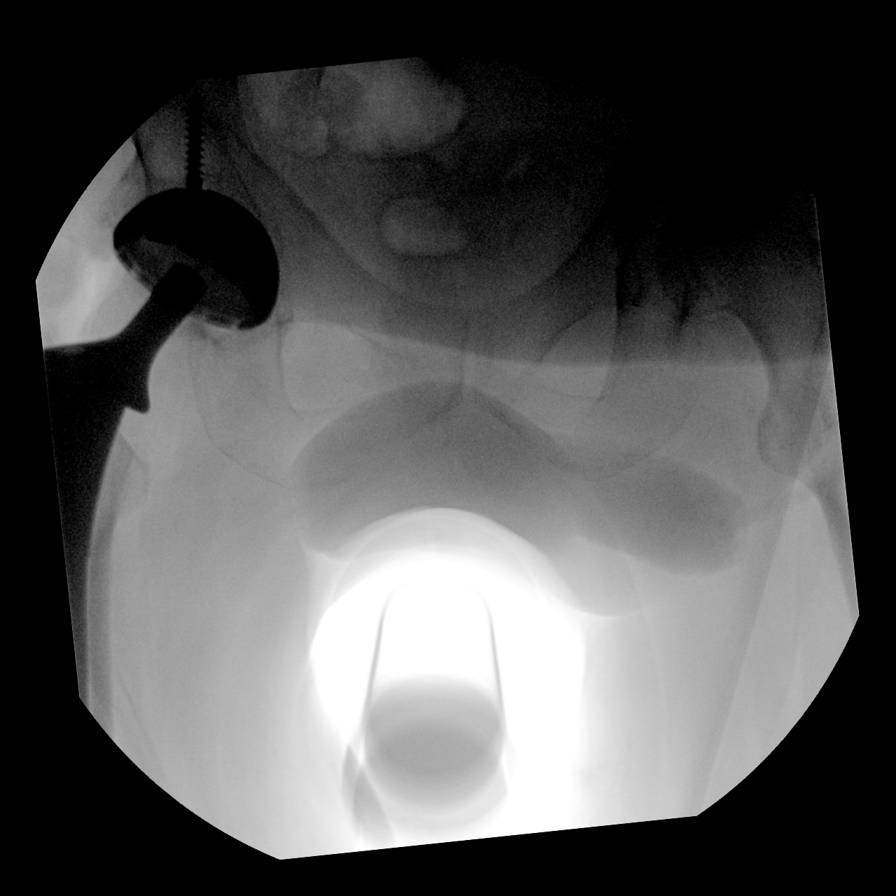

[6 of 6 positions shown; findings below may reference images not displayed]

FINDINGS: 6 intraoperative spot fluoro films obtained during right total hip
replacement. No evidence for immediate hardware complication.
IMPRESSION: Intraoperative assessment during right hip replacement.

## 2020-08-17 IMAGING — RF DG C-ARM 1-60 MIN-NO REPORT
1 series · 6 of 6 positions shown · non-contrast
Comparison: None.

CLINICAL DATA: Hip replacement.

EXAM:
OPERATIVE RIGHT HIP (WITH PELVIS IF PERFORMED) 6 VIEWS
TECHNIQUE: Fluoroscopic spot image(s) were submitted for interpretation
post-operatively.

[Series 1: unknown protocol · 0.20mm/px · 6 of 6 slices shown]
[im 1/6]
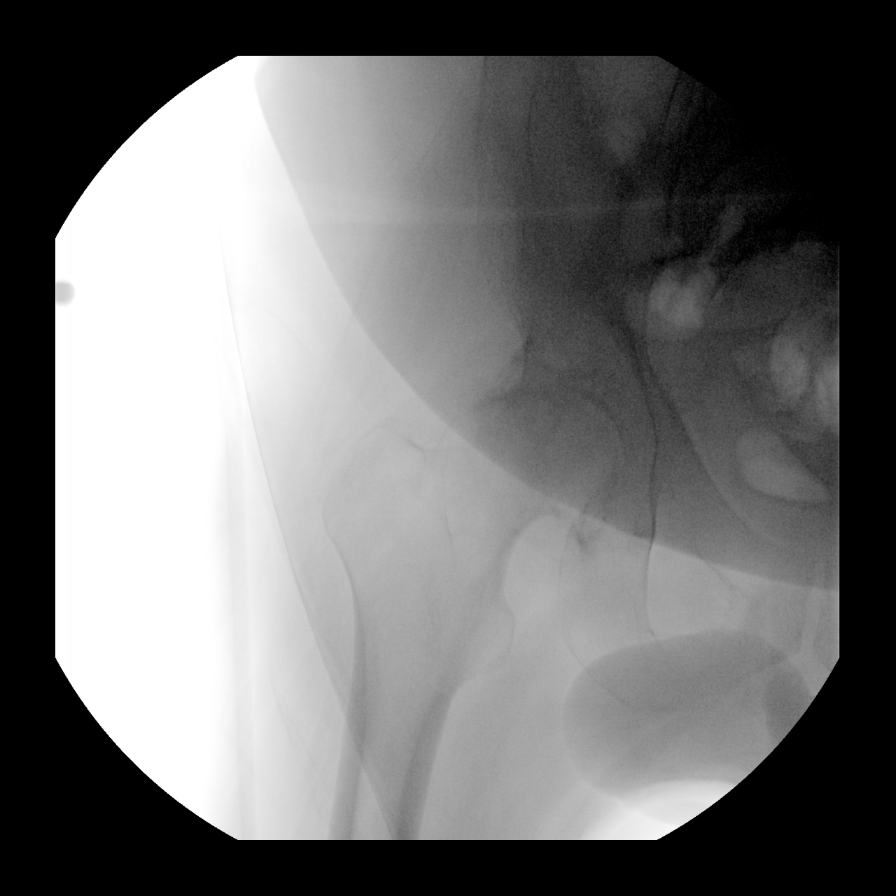
[im 2/6]
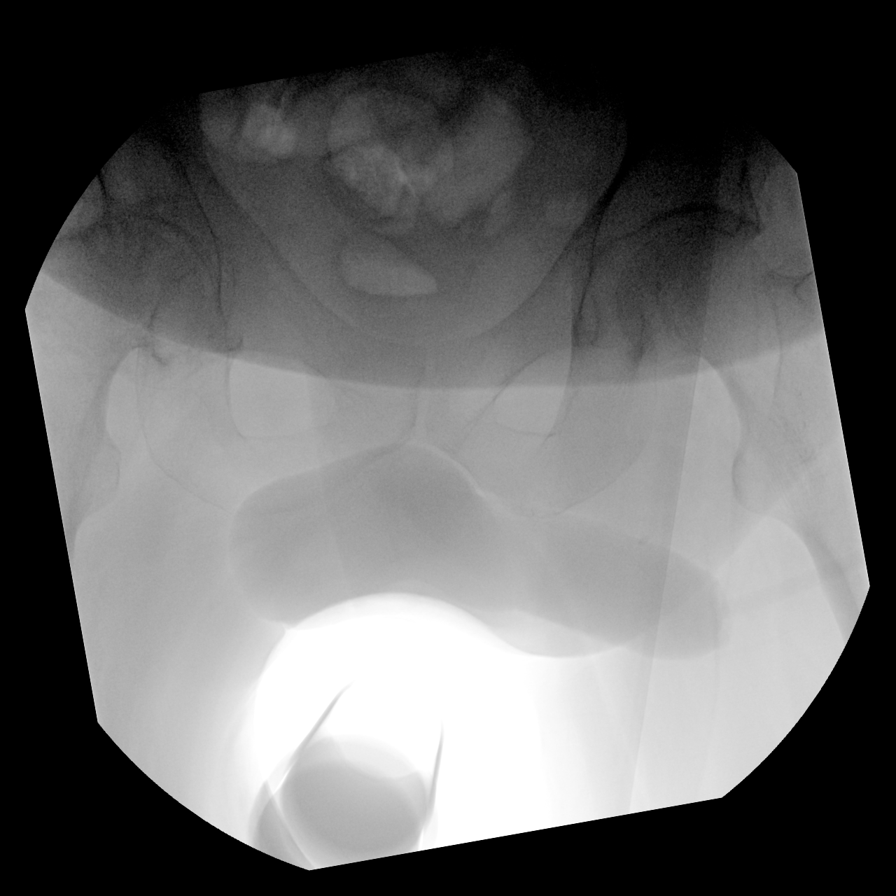
[im 3/6]
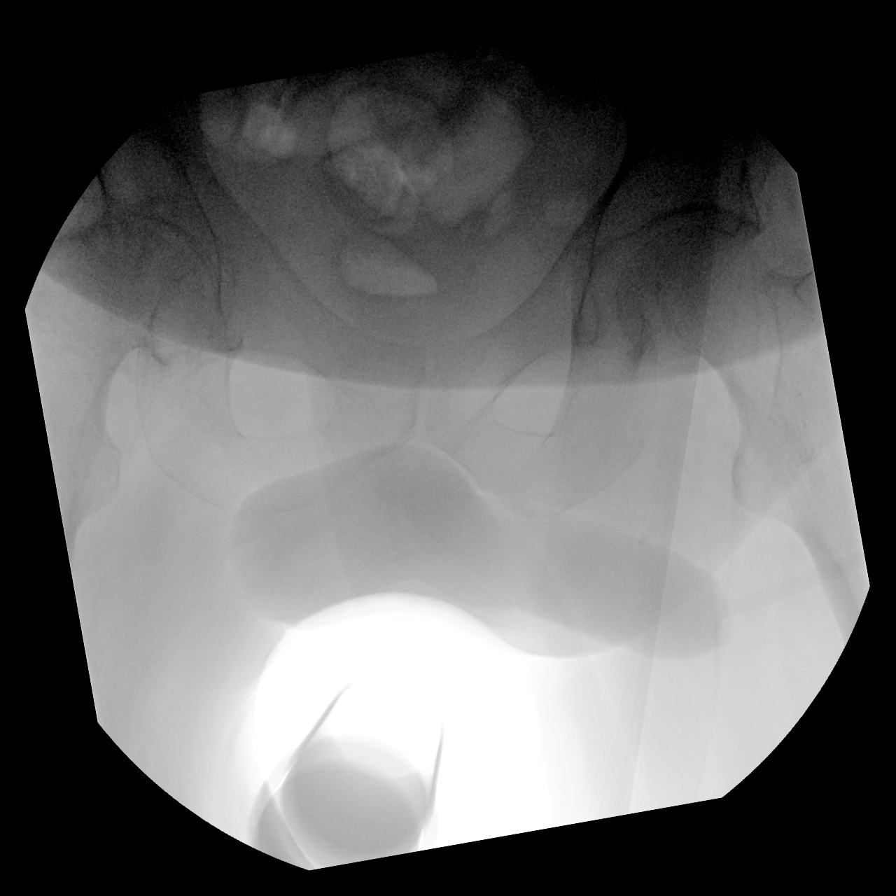
[im 4/6]
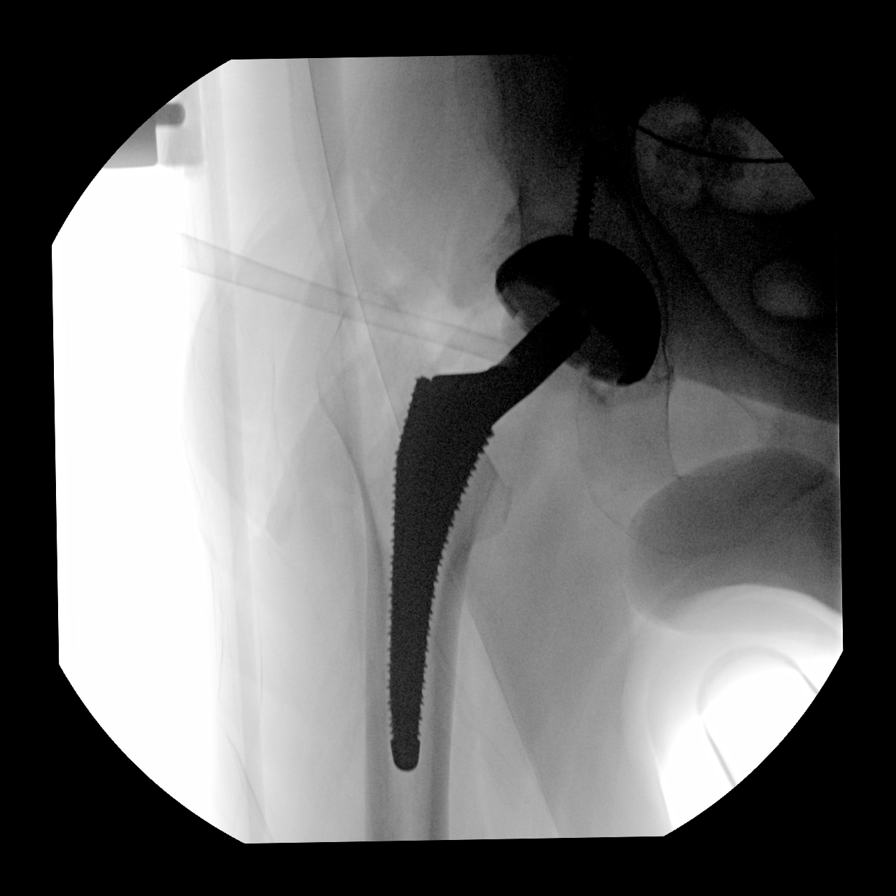
[im 5/6]
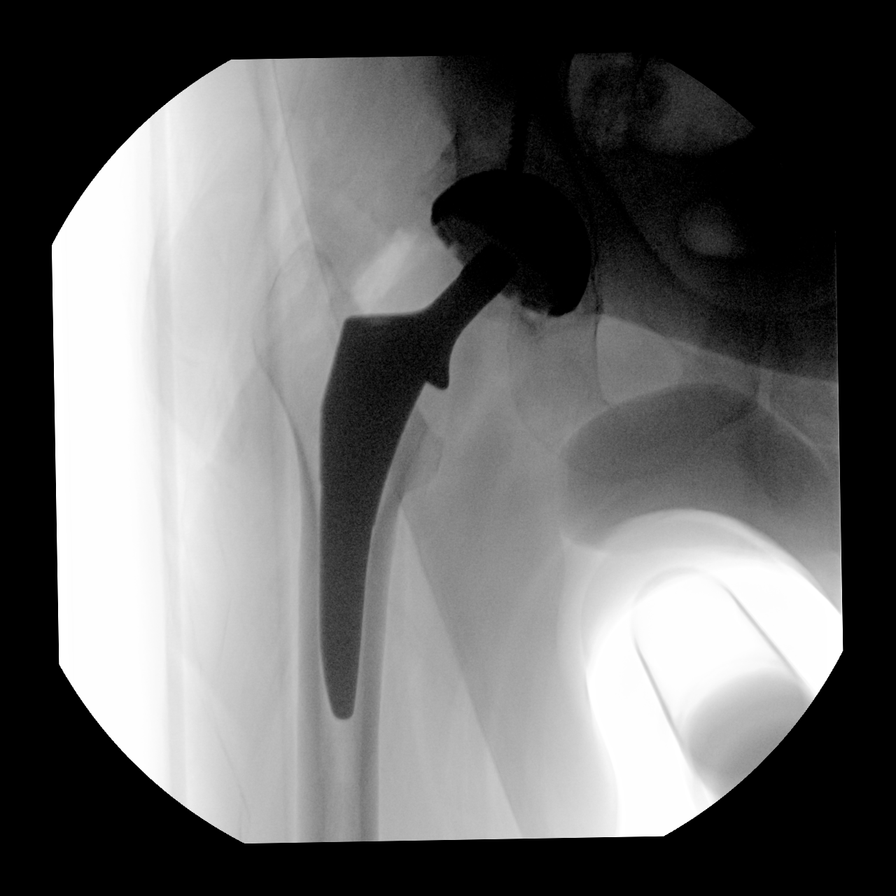
[im 6/6]
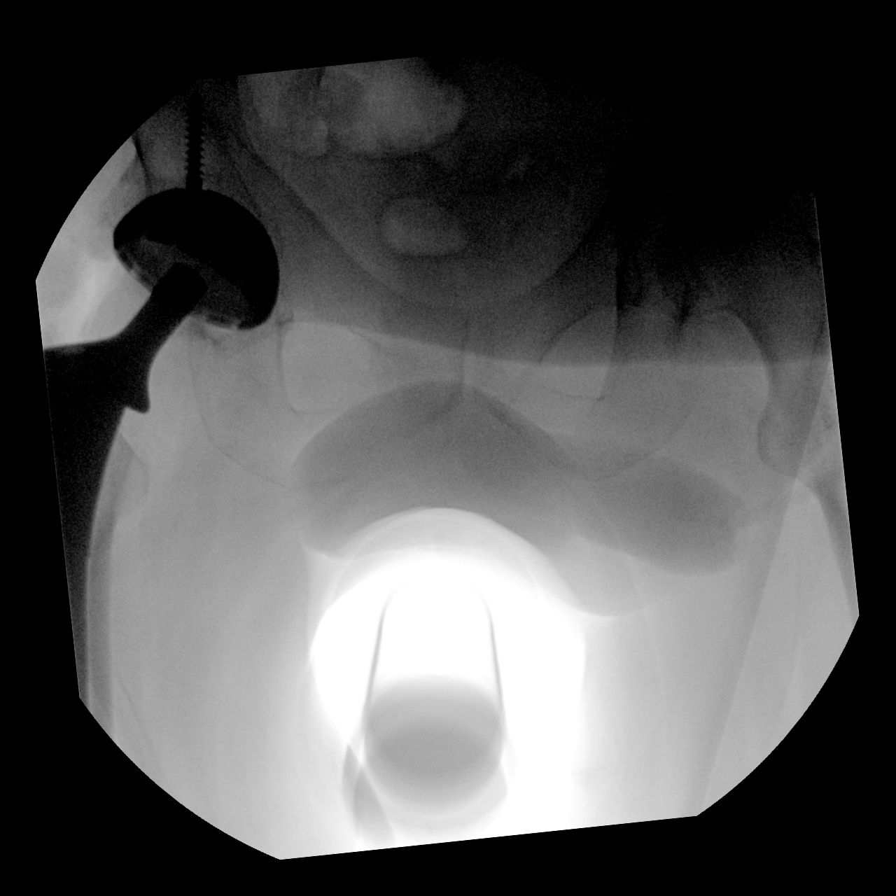

[6 of 6 positions shown; findings below may reference images not displayed]

FINDINGS: 6 intraoperative spot fluoro films obtained during right total hip
replacement. No evidence for immediate hardware complication.
IMPRESSION: Intraoperative assessment during right hip replacement.

## 2020-08-17 SURGERY — ARTHROPLASTY, HIP, TOTAL, ANTERIOR APPROACH
Anesthesia: Monitor Anesthesia Care | Site: Hip | Laterality: Right

## 2020-08-17 MED ORDER — STERILE WATER FOR IRRIGATION IR SOLN
Status: DC | PRN
  Administered 2020-08-17: 2000 mL

## 2020-08-17 MED ORDER — DIPHENHYDRAMINE HCL 12.5 MG/5ML PO ELIX: 12.5000 mg | ORAL_SOLUTION | ORAL | Status: DC | PRN

## 2020-08-17 MED ORDER — FERROUS SULFATE 325 (65 FE) MG PO TABS
325.0000 mg | ORAL_TABLET | Freq: Three times a day (TID) | ORAL | Status: DC
  Administered 2020-08-17 – 2020-08-19 (×6): 325 mg via ORAL
  Filled 2020-08-17 (×6): qty 1

## 2020-08-17 MED ORDER — MOMETASONE FURO-FORMOTEROL FUM 200-5 MCG/ACT IN AERO
2.0000 | INHALATION_SPRAY | Freq: Two times a day (BID) | RESPIRATORY_TRACT | Status: DC
  Administered 2020-08-17 – 2020-08-19 (×4): 2 via RESPIRATORY_TRACT
  Filled 2020-08-17: qty 8.8

## 2020-08-17 MED ORDER — CEFAZOLIN SODIUM-DEXTROSE 2-4 GM/100ML-% IV SOLN
2.0000 g | Freq: Four times a day (QID) | INTRAVENOUS | Status: AC
  Administered 2020-08-17 (×2): 2 g via INTRAVENOUS
  Filled 2020-08-17 (×2): qty 100

## 2020-08-17 MED ORDER — IPRATROPIUM-ALBUTEROL 0.5-2.5 (3) MG/3ML IN SOLN
3.0000 mL | Freq: Once | RESPIRATORY_TRACT | Status: AC
  Administered 2020-08-17: 3 mL via RESPIRATORY_TRACT
  Filled 2020-08-17: qty 3

## 2020-08-17 MED ORDER — METOPROLOL TARTRATE 50 MG PO TABS: 100.0000 mg | ORAL_TABLET | Freq: Two times a day (BID) | ORAL | Status: DC

## 2020-08-17 MED ORDER — MENTHOL 3 MG MT LOZG: 1.0000 | LOZENGE | OROMUCOSAL | Status: DC | PRN

## 2020-08-17 MED ORDER — CEFAZOLIN SODIUM-DEXTROSE 2-4 GM/100ML-% IV SOLN
2.0000 g | INTRAVENOUS | Status: AC
  Administered 2020-08-17: 2 g via INTRAVENOUS
  Filled 2020-08-17: qty 100

## 2020-08-17 MED ORDER — POVIDONE-IODINE 10 % EX SWAB
2.0000 "application " | Freq: Once | CUTANEOUS | Status: AC
  Administered 2020-08-17: 2 via TOPICAL

## 2020-08-17 MED ORDER — ACETAMINOPHEN 160 MG/5ML PO SOLN: 1000.0000 mg | Freq: Once | ORAL | Status: DC | PRN

## 2020-08-17 MED ORDER — HYDROCODONE-ACETAMINOPHEN 5-325 MG PO TABS
1.0000 | ORAL_TABLET | ORAL | Status: DC | PRN
  Administered 2020-08-17: 1 via ORAL
  Administered 2020-08-18 (×3): 2 via ORAL
  Filled 2020-08-17: qty 1
  Filled 2020-08-17 (×3): qty 2

## 2020-08-17 MED ORDER — CHLORHEXIDINE GLUCONATE 0.12 % MT SOLN
15.0000 mL | Freq: Once | OROMUCOSAL | Status: AC
Start: 2020-08-17 — End: 2020-08-17
  Administered 2020-08-17: 15 mL via OROMUCOSAL

## 2020-08-17 MED ORDER — LISINOPRIL-HYDROCHLOROTHIAZIDE 20-12.5 MG PO TABS: 1.0000 | ORAL_TABLET | Freq: Every day | ORAL | Status: DC

## 2020-08-17 MED ORDER — HYDROCHLOROTHIAZIDE 12.5 MG PO CAPS: 12.5000 mg | ORAL_CAPSULE | Freq: Every day | ORAL | Status: DC

## 2020-08-17 MED ORDER — DOCUSATE SODIUM 100 MG PO CAPS
100.0000 mg | ORAL_CAPSULE | Freq: Two times a day (BID) | ORAL | Status: DC
  Administered 2020-08-17 – 2020-08-19 (×4): 100 mg via ORAL
  Filled 2020-08-17 (×4): qty 1

## 2020-08-17 MED ORDER — LISINOPRIL 20 MG PO TABS: 20.0000 mg | ORAL_TABLET | Freq: Every day | ORAL | Status: DC

## 2020-08-17 MED ORDER — ONDANSETRON HCL 4 MG PO TABS: 4.0000 mg | ORAL_TABLET | Freq: Four times a day (QID) | ORAL | Status: DC | PRN

## 2020-08-17 MED ORDER — METOCLOPRAMIDE HCL 5 MG/ML IJ SOLN: 5.0000 mg | Freq: Three times a day (TID) | INTRAMUSCULAR | Status: DC | PRN

## 2020-08-17 MED ORDER — TRANEXAMIC ACID-NACL 1000-0.7 MG/100ML-% IV SOLN
1000.0000 mg | Freq: Once | INTRAVENOUS | Status: AC
  Administered 2020-08-17: 1000 mg via INTRAVENOUS
  Filled 2020-08-17: qty 100

## 2020-08-17 MED ORDER — BUDESONIDE 0.5 MG/2ML IN SUSP
0.5000 mg | Freq: Two times a day (BID) | RESPIRATORY_TRACT | Status: DC
  Administered 2020-08-17 – 2020-08-19 (×4): 0.5 mg via RESPIRATORY_TRACT
  Filled 2020-08-17 (×4): qty 2

## 2020-08-17 MED ORDER — PHENOL 1.4 % MT LIQD: 1.0000 | OROMUCOSAL | Status: DC | PRN

## 2020-08-17 MED ORDER — HYDROCODONE-ACETAMINOPHEN 7.5-325 MG PO TABS
1.0000 | ORAL_TABLET | ORAL | Status: DC | PRN
  Administered 2020-08-17 – 2020-08-18 (×2): 2 via ORAL
  Administered 2020-08-19: 1 via ORAL
  Filled 2020-08-17: qty 2
  Filled 2020-08-17: qty 1
  Filled 2020-08-17: qty 2

## 2020-08-17 MED ORDER — POLYETHYLENE GLYCOL 3350 17 G PO PACK: 17.0000 g | PACK | Freq: Every day | ORAL | Status: DC | PRN

## 2020-08-17 MED ORDER — ACETAMINOPHEN 325 MG PO TABS: 325.0000 mg | ORAL_TABLET | Freq: Four times a day (QID) | ORAL | Status: DC | PRN

## 2020-08-17 MED ORDER — OXYCODONE HCL 5 MG/5ML PO SOLN: 5.0000 mg | Freq: Once | ORAL | Status: DC | PRN

## 2020-08-17 MED ORDER — IPRATROPIUM-ALBUTEROL 0.5-2.5 (3) MG/3ML IN SOLN: 3.0000 mL | Freq: Four times a day (QID) | RESPIRATORY_TRACT | Status: DC | PRN

## 2020-08-17 MED ORDER — EPHEDRINE SULFATE-NACL 50-0.9 MG/10ML-% IV SOSY
PREFILLED_SYRINGE | INTRAVENOUS | Status: DC | PRN
  Administered 2020-08-17: 10 mg via INTRAVENOUS
  Administered 2020-08-17: 20 mg via INTRAVENOUS
  Administered 2020-08-17: 10 mg via INTRAVENOUS

## 2020-08-17 MED ORDER — ATORVASTATIN CALCIUM 10 MG PO TABS
10.0000 mg | ORAL_TABLET | Freq: Every day | ORAL | Status: DC
  Administered 2020-08-17 – 2020-08-19 (×3): 10 mg via ORAL
  Filled 2020-08-17 (×3): qty 1

## 2020-08-17 MED ORDER — 0.9 % SODIUM CHLORIDE (POUR BTL) OPTIME
TOPICAL | Status: DC | PRN
  Administered 2020-08-17: 1000 mL

## 2020-08-17 MED ORDER — DEXAMETHASONE SODIUM PHOSPHATE 10 MG/ML IJ SOLN
10.0000 mg | Freq: Once | INTRAMUSCULAR | Status: AC
  Administered 2020-08-18: 10 mg via INTRAVENOUS
  Filled 2020-08-17: qty 1

## 2020-08-17 MED ORDER — PHENYLEPHRINE HCL-NACL 10-0.9 MG/250ML-% IV SOLN
INTRAVENOUS | Status: DC | PRN
  Administered 2020-08-17: 50 ug/min via INTRAVENOUS

## 2020-08-17 MED ORDER — ONDANSETRON HCL 4 MG/2ML IJ SOLN
INTRAMUSCULAR | Status: AC
  Filled 2020-08-17: qty 2

## 2020-08-17 MED ORDER — PHENYLEPHRINE 40 MCG/ML (10ML) SYRINGE FOR IV PUSH (FOR BLOOD PRESSURE SUPPORT)
PREFILLED_SYRINGE | INTRAVENOUS | Status: DC | PRN
  Administered 2020-08-17: 120 ug via INTRAVENOUS

## 2020-08-17 MED ORDER — METHOCARBAMOL 1000 MG/10ML IJ SOLN
500.0000 mg | Freq: Four times a day (QID) | INTRAVENOUS | Status: DC | PRN
  Filled 2020-08-17: qty 5

## 2020-08-17 MED ORDER — BISACODYL 10 MG RE SUPP: 10.0000 mg | Freq: Every day | RECTAL | Status: DC | PRN

## 2020-08-17 MED ORDER — ACETAMINOPHEN 500 MG PO TABS: 1000.0000 mg | ORAL_TABLET | Freq: Once | ORAL | Status: DC | PRN

## 2020-08-17 MED ORDER — GLYCOPYRROLATE PF 0.2 MG/ML IJ SOSY
PREFILLED_SYRINGE | INTRAMUSCULAR | Status: DC | PRN
  Administered 2020-08-17: .1 mg via INTRAVENOUS

## 2020-08-17 MED ORDER — PHENYLEPHRINE HCL (PRESSORS) 10 MG/ML IV SOLN
INTRAVENOUS | Status: AC
  Filled 2020-08-17: qty 1

## 2020-08-17 MED ORDER — FENTANYL CITRATE (PF) 100 MCG/2ML IJ SOLN
INTRAMUSCULAR | Status: AC
  Filled 2020-08-17: qty 2

## 2020-08-17 MED ORDER — DEXAMETHASONE SODIUM PHOSPHATE 10 MG/ML IJ SOLN
8.0000 mg | Freq: Once | INTRAMUSCULAR | Status: AC
  Administered 2020-08-17: 10 mg via INTRAVENOUS

## 2020-08-17 MED ORDER — PROPOFOL 10 MG/ML IV BOLUS
INTRAVENOUS | Status: DC | PRN
  Administered 2020-08-17: 30 mg via INTRAVENOUS

## 2020-08-17 MED ORDER — MORPHINE SULFATE (PF) 2 MG/ML IV SOLN: 0.5000 mg | INTRAVENOUS | Status: DC | PRN

## 2020-08-17 MED ORDER — LACTATED RINGERS IV SOLN: INTRAVENOUS | Status: DC

## 2020-08-17 MED ORDER — METHOCARBAMOL 500 MG PO TABS
500.0000 mg | ORAL_TABLET | Freq: Four times a day (QID) | ORAL | Status: DC | PRN
  Administered 2020-08-17: 500 mg via ORAL
  Filled 2020-08-17: qty 1

## 2020-08-17 MED ORDER — PROPOFOL 500 MG/50ML IV EMUL
INTRAVENOUS | Status: DC | PRN
  Administered 2020-08-17: 80 ug/kg/min via INTRAVENOUS

## 2020-08-17 MED ORDER — FENTANYL CITRATE (PF) 100 MCG/2ML IJ SOLN: 25.0000 ug | INTRAMUSCULAR | Status: DC | PRN

## 2020-08-17 MED ORDER — ASPIRIN 81 MG PO CHEW
81.0000 mg | CHEWABLE_TABLET | Freq: Two times a day (BID) | ORAL | Status: DC
  Administered 2020-08-17 – 2020-08-19 (×4): 81 mg via ORAL
  Filled 2020-08-17 (×4): qty 1

## 2020-08-17 MED ORDER — ORAL CARE MOUTH RINSE: 15.0000 mL | Freq: Once | OROMUCOSAL | Status: AC

## 2020-08-17 MED ORDER — METOCLOPRAMIDE HCL 5 MG PO TABS: 5.0000 mg | ORAL_TABLET | Freq: Three times a day (TID) | ORAL | Status: DC | PRN

## 2020-08-17 MED ORDER — ONDANSETRON HCL 4 MG/2ML IJ SOLN
INTRAMUSCULAR | Status: DC | PRN
  Administered 2020-08-17: 4 mg via INTRAVENOUS

## 2020-08-17 MED ORDER — PHENYLEPHRINE 40 MCG/ML (10ML) SYRINGE FOR IV PUSH (FOR BLOOD PRESSURE SUPPORT)
PREFILLED_SYRINGE | INTRAVENOUS | Status: AC
  Filled 2020-08-17: qty 10

## 2020-08-17 MED ORDER — MIDAZOLAM HCL 2 MG/2ML IJ SOLN
INTRAMUSCULAR | Status: AC
  Filled 2020-08-17: qty 2

## 2020-08-17 MED ORDER — ONDANSETRON HCL 4 MG/2ML IJ SOLN: 4.0000 mg | Freq: Four times a day (QID) | INTRAMUSCULAR | Status: DC | PRN

## 2020-08-17 MED ORDER — EPHEDRINE 5 MG/ML INJ
INTRAVENOUS | Status: AC
  Filled 2020-08-17: qty 10

## 2020-08-17 MED ORDER — BUPIVACAINE IN DEXTROSE 0.75-8.25 % IT SOLN
INTRATHECAL | Status: DC | PRN
  Administered 2020-08-17: 1.8 mL via INTRATHECAL

## 2020-08-17 MED ORDER — ALBUTEROL SULFATE (2.5 MG/3ML) 0.083% IN NEBU: 2.5000 mg | INHALATION_SOLUTION | Freq: Four times a day (QID) | RESPIRATORY_TRACT | Status: DC | PRN

## 2020-08-17 MED ORDER — TRANEXAMIC ACID-NACL 1000-0.7 MG/100ML-% IV SOLN
1000.0000 mg | INTRAVENOUS | Status: AC
  Administered 2020-08-17: 1000 mg via INTRAVENOUS
  Filled 2020-08-17: qty 100

## 2020-08-17 MED ORDER — MIDAZOLAM HCL 2 MG/2ML IJ SOLN
INTRAMUSCULAR | Status: DC | PRN
  Administered 2020-08-17: 2 mg via INTRAVENOUS

## 2020-08-17 MED ORDER — OXYCODONE HCL 5 MG PO TABS: 5.0000 mg | ORAL_TABLET | Freq: Once | ORAL | Status: DC | PRN

## 2020-08-17 MED ORDER — SODIUM CHLORIDE 0.9 % IV SOLN: INTRAVENOUS | Status: DC

## 2020-08-17 MED ORDER — FOLIC ACID 1 MG PO TABS
1.0000 mg | ORAL_TABLET | Freq: Every day | ORAL | Status: DC
  Administered 2020-08-17 – 2020-08-19 (×3): 1 mg via ORAL
  Filled 2020-08-17 (×3): qty 1

## 2020-08-17 MED ORDER — DEXAMETHASONE SODIUM PHOSPHATE 10 MG/ML IJ SOLN
INTRAMUSCULAR | Status: AC
  Filled 2020-08-17: qty 1

## 2020-08-17 MED ORDER — GLYCOPYRROLATE PF 0.2 MG/ML IJ SOSY
PREFILLED_SYRINGE | INTRAMUSCULAR | Status: AC
  Filled 2020-08-17: qty 1

## 2020-08-17 MED ORDER — ACETAMINOPHEN 10 MG/ML IV SOLN: 1000.0000 mg | Freq: Once | INTRAVENOUS | Status: DC | PRN

## 2020-08-17 SURGICAL SUPPLY — 40 items
BAG DECANTER FOR FLEXI CONT (MISCELLANEOUS) IMPLANT
BAG ZIPLOCK 12X15 (MISCELLANEOUS) IMPLANT
BLADE SAG 18X100X1.27 (BLADE) ×2 IMPLANT
COVER PERINEAL POST (MISCELLANEOUS) ×2 IMPLANT
COVER SURGICAL LIGHT HANDLE (MISCELLANEOUS) ×2 IMPLANT
COVER WAND RF STERILE (DRAPES) IMPLANT
CUP ACETBLR 54 OD PINNACLE (Hips) ×2 IMPLANT
DERMABOND ADVANCED (GAUZE/BANDAGES/DRESSINGS) ×1
DERMABOND ADVANCED .7 DNX12 (GAUZE/BANDAGES/DRESSINGS) ×1 IMPLANT
DRAPE FOOT SWITCH (DRAPES) ×2 IMPLANT
DRAPE STERI IOBAN 125X83 (DRAPES) ×2 IMPLANT
DRAPE U-SHAPE 47X51 STRL (DRAPES) ×4 IMPLANT
DRESSING AQUACEL AG SP 3.5X10 (GAUZE/BANDAGES/DRESSINGS) ×1 IMPLANT
DRSG AQUACEL AG ADV 3.5X10 (GAUZE/BANDAGES/DRESSINGS) ×2 IMPLANT
DRSG AQUACEL AG SP 3.5X10 (GAUZE/BANDAGES/DRESSINGS) ×2
DURAPREP 26ML APPLICATOR (WOUND CARE) ×2 IMPLANT
ELECT REM PT RETURN 15FT ADLT (MISCELLANEOUS) ×2 IMPLANT
ELIMINATOR HOLE APEX DEPUY (Hips) ×2 IMPLANT
GLOVE SURG ENC MOIS LTX SZ6 (GLOVE) ×4 IMPLANT
GLOVE SURG UNDER LTX SZ7.5 (GLOVE) ×2 IMPLANT
GLOVE SURG UNDER POLY LF SZ6.5 (GLOVE) ×2 IMPLANT
GLOVE SURG UNDER POLY LF SZ7.5 (GLOVE) ×4 IMPLANT
GOWN STRL REUS W/TWL LRG LVL3 (GOWN DISPOSABLE) ×4 IMPLANT
HEAD CERAMIC 36 PLUS5 (Hips) ×2 IMPLANT
HOLDER FOLEY CATH W/STRAP (MISCELLANEOUS) ×2 IMPLANT
KIT TURNOVER KIT A (KITS) ×2 IMPLANT
LINER NEUTRAL 54X36MM PLUS 4 (Hips) ×2 IMPLANT
PACK ANTERIOR HIP CUSTOM (KITS) ×2 IMPLANT
PENCIL SMOKE EVACUATOR (MISCELLANEOUS) IMPLANT
SCREW 6.5MMX30MM (Screw) ×2 IMPLANT
STEM FEMORAL SZ6 HIGH ACTIS (Stem) ×2 IMPLANT
SUT MNCRL AB 4-0 PS2 18 (SUTURE) ×2 IMPLANT
SUT STRATAFIX 0 PDS 27 VIOLET (SUTURE) ×2
SUT VIC AB 1 CT1 36 (SUTURE) ×6 IMPLANT
SUT VIC AB 2-0 CT1 27 (SUTURE) ×4
SUT VIC AB 2-0 CT1 TAPERPNT 27 (SUTURE) ×2 IMPLANT
SUTURE STRATFX 0 PDS 27 VIOLET (SUTURE) ×1 IMPLANT
TRAY FOLEY MTR SLVR 16FR STAT (SET/KITS/TRAYS/PACK) IMPLANT
TUBE SUCTION HIGH CAP CLEAR NV (SUCTIONS) ×2 IMPLANT
WATER STERILE IRR 1000ML POUR (IV SOLUTION) ×2 IMPLANT

## 2020-08-17 NOTE — Discharge Instructions (Signed)

## 2020-08-17 NOTE — Anesthesia Procedure Notes (Signed)
Spinal  Patient location during procedure: OR Start time: 08/17/2020 10:22 AM End time: 08/17/2020 10:24 AM Reason for block: surgical anesthesia Staffing Performed: resident/CRNA  Resident/CRNA: [place] Lesle Faron C, CRNA Preanesthetic Checklist Completed: patient identified, IV checked, site marked, risks and benefits discussed, surgical consent, monitors and equipment checked, pre-op evaluation and timeout performed Spinal Block Patient position: sitting Prep: DuraPrep and site prepped and draped Patient monitoring: heart rate, cardiac monitor, continuous pulse ox and blood pressure Approach: midline Location: L3-4 Injection technique: single-shot Needle Needle type: Pencan  Needle gauge: 24 G Needle length: 9 cm Assessment Sensory level: T4 Events: CSF return Additional Notes IV functioning, monitors applied to pt. Expiration date of kit checked and confirmed to be in date. Sterile prep and drape, hand hygiene and sterile gloved used. Pt was positioned and spine was prepped in sterile fashion. Skin was anesthetized with lidocaine. Free flow of clear CSF obtained prior to injecting local anesthetic into CSF x 1 attempt. Spinal needle aspirated freely following injection. Needle was carefully withdrawn, and pt tolerated procedure well. Loss of motor and sensory on exam post injection.

## 2020-08-17 NOTE — Op Note (Signed)
NAME:  Nathan Nicholson                ACCOUNT NO.: [id number]      MEDICAL RECORD NO.: [id number]      FACILITY:  [facility]      PHYSICIAN:  Shelda Pal  DATE OF BIRTH:  [birth year]     DATE OF PROCEDURE:  08/17/2020                                 OPERATIVE REPORT         PREOPERATIVE DIAGNOSIS: Right  hip osteoarthritis.      POSTOPERATIVE DIAGNOSIS:  Right hip osteoarthritis.      PROCEDURE:  Right total hip replacement through an anterior approach   utilizing DePuy THR system, component size 54 mm pinnacle cup, a size 36+4 neutral   Altrex liner, a size 6 Hi Actis stem with a 36+5 delta ceramic   ball.      SURGEON:  Madlyn Frankel. Charlann Boxer, M.D.      ASSISTANT:  Rosalene Billings, PA-C     ANESTHESIA:  Spinal.      SPECIMENS:  None.      COMPLICATIONS:  None.      BLOOD LOSS:  550 cc     DRAINS:  None.      INDICATION OF THE PROCEDURE:  [name] is a 65 y.o. male who had   presented to office for evaluation of right hip pain.  Radiographs revealed   progressive degenerative changes with bone-on-bone   articulation of the  hip joint, including subchondral cystic changes and osteophytes.  The patient had painful limited range of   motion significantly affecting their overall quality of life and function.  The patient was failing to    respond to conservative measures including medications and/or injections and activity modification and at this point was ready   to proceed with more definitive measures.  Consent was obtained for   benefit of pain relief.  Specific risks of infection, DVT, component   failure, dislocation, neurovascular injury, and need for revision surgery were reviewed in the office as well discussion of   the anterior versus posterior approach were reviewed.     PROCEDURE IN DETAIL:  The patient was brought to operative theater.   Once adequate anesthesia, preoperative antibiotics, 2 gm of Ancef, 1 gm of Tranexamic Acid, and 10  mg of Decadron were administered, the patient was positioned supine on the [employer] table.  Once the patient was safely positioned with adequate padding of boney prominences we predraped out the hip, and used fluoroscopy to confirm orientation of the pelvis.      The right hip was then prepped and draped from proximal iliac crest to   mid thigh with a shower curtain technique.      Time-out was performed identifying the patient, planned procedure, and the appropriate extremity.     An incision was then made 2 cm lateral to the   anterior superior iliac spine extending over the orientation of the   tensor fascia lata muscle and sharp dissection was carried down to the   fascia of the muscle.      The fascia was then incised.  The muscle belly was identified and swept   laterally and retractor placed along the superior neck.  Following   cauterization of the circumflex vessels and removing some  pericapsular   fat, a second cobra retractor was placed on the inferior neck.  A T-capsulotomy was made along the line of the   superior neck to the trochanteric fossa, then extended proximally and   distally.  Tag sutures were placed and the retractors were then placed   intracapsular.  We then identified the trochanteric fossa and   orientation of my neck cut and then made a neck osteotomy with the femur on traction.  The femoral   head was removed without difficulty or complication.  Traction was let   off and retractors were placed posterior and anterior around the   acetabulum.      The labrum and foveal tissue were debrided.  I began reaming with a 46 mm   reamer and reamed up to 53 mm reamer with good bony bed preparation and a 54 mm  cup was chosen.  The final 54 mm Pinnacle cup was then impacted under fluoroscopy to confirm the depth of penetration and orientation with respect to   Abduction and forward flexion.  A screw was placed into the ilium followed by the hole eliminator.  The final    36+4 neutral Altrex liner was impacted with good visualized rim fit.  The cup was positioned anatomically within the acetabular portion of the pelvis.      At this point, the femur was rolled to 100 degrees.  Further capsule was   released off the inferior aspect of the femoral neck.  I then   released the superior capsule proximally.  With the leg in a neutral position the hook was placed laterally   along the femur under the vastus lateralis origin and elevated manually and then held in position using the hook attachment on the bed.  The leg was then extended and adducted with the leg rolled to 100   degrees of external rotation.  Retractors were placed along the medial calcar and posteriorly over the greater trochanter.  Once the proximal femur was fully   exposed, I used a box osteotome to set orientation.  I then began   broaching with the starting chili pepper broach and passed this by hand and then broached up to 6.  With the 6 broach in place I chose a high offset neck and did several trial reductions.  The offset was appropriate, leg lengths   appeared to be equal best matched with the +5 head ball trial confirmed radiographically.   Given these findings, I went ahead and dislocated the hip, repositioned all   retractors and positioned the right hip in the extended and abducted position.  The final 6 Hi Actis stem was   chosen and it was impacted down to the level of neck cut.  Based on this   and the trial reductions, a final 36+5 delta ceramic ball was chosen and   impacted onto a clean and dry trunnion, and the hip was reduced.  The   hip had been irrigated throughout the case again at this point.  I did   reapproximate the superior capsular leaflet to the anterior leaflet   using #1 Vicryl.  The fascia of the   tensor fascia lata muscle was then reapproximated using #1 Vicryl and #0 Stratafix sutures.  The   remaining wound was closed with 2-0 Vicryl and running 4-0 Monocryl.    The hip was cleaned, dried, and dressed sterilely using Dermabond and   Aquacel dressing.  The patient was then brought   to  recovery room in stable condition tolerating the procedure well.    Rosalene Billings, PA-C was present for the entirety of the case involved from   preoperative positioning, perioperative retractor management, general   facilitation of the case, as well as primary wound closure as assistant.            Madlyn Frankel Charlann Boxer, M.D.        08/17/2020 10:32 AM

## 2020-08-17 NOTE — Progress Notes (Addendum)
RN notified me that patient had sustained bradycardia, HR 30's-40s. Most recent BP 142/120, but prior BP readings were soft. He does not have cardiac history, and is not followed by a Cardiologist. I discussed with Dr. Charlann Boxer who recommended cardiac consult. Spoke with [employer] who stated they had rounded for the day, but would be able to see him tomorrow. Recommended EKG and transfer to telemetry for now. Will hold beta blocker and ACEi. Minimize opioid use as tolerated.   Dennie Bible, PA-C Orthopedic Surgery [facility] [place] [phone]

## 2020-08-17 NOTE — Transfer of Care (Signed)
Immediate Anesthesia Transfer of Care Note  Patient: Nathan Nicholson  Procedure(s) Performed: TOTAL HIP ARTHROPLASTY ANTERIOR APPROACH (Right: Hip)  Patient Location: PACU  Anesthesia Type:Spinal  Level of Consciousness: awake, alert  and oriented  Airway & Oxygen Therapy: Patient Spontanous Breathing and Patient connected to face mask oxygen  Post-op Assessment: Report given to RN and Post -op Vital signs reviewed and stable  Post vital signs: Reviewed and stable  Last Vitals:  Vitals Value Taken Time  BP 107/68 08/17/20 1215  Temp    Pulse 48 08/17/20 1216  Resp 18 08/17/20 1216  SpO2 100 % 08/17/20 1216  Vitals shown include unvalidated device data.  Last Pain:  Vitals:   08/17/20 0805  TempSrc:   PainSc: 5       Patients Stated Pain Goal: 6 (08/17/20 0805)  Complications: No notable events documented.

## 2020-08-17 NOTE — Interval H&P Note (Signed)
History and Physical Interval Note:  08/17/2020 9:01 AM  Nathan Nicholson  has presented today for surgery, with the diagnosis of Right hip osteoarthritis.  The various methods of treatment have been discussed with the patient and family. After consideration of risks, benefits and other options for treatment, the patient has consented to  Procedure(s) with comments: TOTAL HIP ARTHROPLASTY ANTERIOR APPROACH (Right) - 70 min as a surgical intervention.  The patient's history has been reviewed, patient examined, no change in status, stable for surgery.  I have reviewed the patient's chart and labs.  Questions were answered to the patient's satisfaction.     Shelda Pal

## 2020-08-17 NOTE — Progress Notes (Signed)
   08/17/20 1801  Vitals  Temp (!) 97.5 F (36.4 C)  Temp Source Oral  BP 107/62  MAP (mmHg) 74  BP Location Right Arm  BP Method Automatic  Patient Position (if appropriate) Sitting  Pulse Rate 76  Patient is alert and oriented x 4. Arrived to room 1411. RR even and unlabored, on 2L of nasal cannula. Per patient, " I wear oxygen occassionally at home." Patient has foley draining clear yellow urine. Hooked up to telemetry monitor, SR in the 80's noted on the monitor. Right hip replacement dressing, clean, dry and intact. Oriented to the room, call bell, family at bedside. Patient now eating dinner. Will continue to monitor.

## 2020-08-18 ENCOUNTER — Encounter (HOSPITAL_COMMUNITY): Payer: Self-pay | Admitting: Orthopedic Surgery

## 2020-08-18 ENCOUNTER — Observation Stay (HOSPITAL_COMMUNITY): Payer: Medicare HMO

## 2020-08-18 DIAGNOSIS — Z96641 Presence of right artificial hip joint: Secondary | ICD-10-CM | POA: Diagnosis not present

## 2020-08-18 DIAGNOSIS — Z96649 Presence of unspecified artificial hip joint: Secondary | ICD-10-CM

## 2020-08-18 DIAGNOSIS — R9431 Abnormal electrocardiogram [ECG] [EKG]: Secondary | ICD-10-CM | POA: Diagnosis not present

## 2020-08-18 DIAGNOSIS — I493 Ventricular premature depolarization: Secondary | ICD-10-CM | POA: Diagnosis not present

## 2020-08-18 DIAGNOSIS — I9789 Other postprocedural complications and disorders of the circulatory system, not elsewhere classified: Secondary | ICD-10-CM | POA: Diagnosis not present

## 2020-08-18 LAB — BASIC METABOLIC PANEL
Anion gap: 6 (ref 5–15)
BUN: 11 mg/dL (ref 8–23)
CO2: 26 mmol/L (ref 22–32)
Calcium: 8.5 mg/dL — ABNORMAL LOW (ref 8.9–10.3)
Chloride: 101 mmol/L (ref 98–111)
Creatinine, Ser: 0.92 mg/dL (ref 0.61–1.24)
GFR, Estimated: >60 mL/min (ref >60)
Glucose, Bld: 191 mg/dL — ABNORMAL HIGH (ref 70–99)
Potassium: 3.9 mmol/L (ref 3.5–5.1)
Sodium: 133 mmol/L — ABNORMAL LOW (ref 135–145)

## 2020-08-18 LAB — CBC
HCT: 39.8 % (ref 39.0–52.0)
Hemoglobin: 13 g/dL (ref 13.0–17.0)
MCH: 31.3 pg (ref 26.0–34.0)
MCHC: 32.7 g/dL (ref 30.0–36.0)
MCV: 95.9 fL (ref 80.0–100.0)
Platelets: 290 10*3/uL (ref 150–400)
RBC: 4.15 MIL/uL — ABNORMAL LOW (ref 4.22–5.81)
RDW: 14.6 % (ref 11.5–15.5)
WBC: 26.1 10*3/uL — ABNORMAL HIGH (ref 4.0–10.5)
nRBC: 0 % (ref 0.0–0.2)

## 2020-08-18 LAB — ECHOCARDIOGRAM COMPLETE
AR max vel: 2.47 cm2
AV Area VTI: 3.1 cm2
AV Area mean vel: 2.29 cm2
AV Mean grad: 6 mmHg
AV Peak grad: 11.3 mmHg
Ao pk vel: 1.68 m/s
Area-P 1/2: 3.42 cm2
Height: 62 in
S' Lateral: 3.3 cm
Weight: 3399.99 oz

## 2020-08-18 LAB — TSH: TSH: 0.881 u[IU]/mL (ref 0.350–4.500)

## 2020-08-18 LAB — MAGNESIUM: Magnesium: 1.6 mg/dL — ABNORMAL LOW (ref 1.7–2.4)

## 2020-08-18 MED ORDER — PERFLUTREN LIPID MICROSPHERE
1.0000 mL | INTRAVENOUS | Status: AC | PRN
  Administered 2020-08-18: 5 mL via INTRAVENOUS
  Filled 2020-08-18: qty 10

## 2020-08-18 NOTE — Consult Note (Addendum)
Cardiology Consultation:   Patient ID: Nathan Nicholson MRN: [id number]; DOB: Sep 09, 1955  Admit date: 08/17/2020 Date of Consult: 08/18/2020  PCP:  Nathan Councilman, FNP   [facility] Providers Cardiologist:  None      new  Patient Profile:   Nathan Nicholson is a 65 y.o. male with a hx of COPD, COVID 19 03/2020, HTN, RA, DOE Felty syndrome s/p splenectomy, + tobacco and Rt hip pain due to arthritis with need for total hip which he underwent 08/17/20  who is being seen 08/18/2020 for the evaluation of bradycardia at the request of Dr. Charlann Nicholson.  History of Present Illness:   Nathan Nicholson with above hx and no prior cardiac hx underwent Rt total hip replacement through ant approach.  Post op pt with HR in 30-40 range.  No strips of this HR.  He was transferred to tele.  HR noted to be 38 at 1401 on vs sheet. On EKG SR with big PVCs -would assume radial pulse did not pick up PVCs.   No ST changes on EKG.  I have no old EKGs to compare.  BP was stable, soft post op but no acute drops.  Pt's lopressor was stopped.  His lisinopril hct was held with soft BP. BP soft before HR drop per vs sheet.  Pt was on lopresor 100 mg BID. For his COPD he does use home 02 at times.   EKG:  The EKG was personally reviewed and demonstrates:  SR with freq PVCs HR 76 no ST changes Telemetry:  Telemetry was personally reviewed and demonstrates:  SR with PVCs  Pre-op labs were stable with K+ 3.6 Cr 0.78 Hgb 15.3 WBC 13.3 Yesterday K+ did drop to 3.3  Today Na 133, K+ 3.9, BUN 11 Cr 0.92  WBC 26, Hgb 13 plts 290 (rec'd steroids yesterday)  Currently BP 112/67 P 69 R 19 afebrile  Pt denies any chest pain now or in past, no syncope, only feels lightheaded after cough at times.  His brother had MI at 48.   Past Medical History:  Diagnosis Date   COPD (chronic obstructive pulmonary disease) (HCC)    COVID-19    03-2020   Dyspnea    with exertion   Hypertension    Pneumonia    Rheumatoid arthritis (HCC)      Past Surgical History:  Procedure Laterality Date   FOOT SURGERY     left   HERNIA REPAIR     SPLENECTOMY     TOOTH EXTRACTION       Home Medications:  Prior to Admission medications   Medication Sig Start Date End Date Taking? Authorizing Provider  albuterol (PROVENTIL) (2.5 MG/3ML) 0.083% nebulizer solution Take 2.5 mg by nebulization every 6 (six) hours as needed for wheezing or shortness of breath.   Yes [provider]  aspirin EC 81 MG tablet Take 81 mg by mouth daily. Swallow whole.   Yes [provider]  atorvastatin (LIPITOR) 10 MG tablet Take 10 mg by mouth daily.   Yes [provider]  budesonide (PULMICORT) 0.5 MG/2ML nebulizer solution Take 0.5 mg by nebulization 2 (two) times daily.   Yes [provider]  cyanocobalamin (,VITAMIN B-12,) 1000 MCG/ML injection Inject 1,000 mcg into the muscle every 30 (thirty) days.   Yes [provider]  Fluticasone-Salmeterol (ADVAIR) 250-50 MCG/DOSE AEPB Inhale 2 puffs into the lungs 2 (two) times daily as needed (asthma).   Yes [provider]  folic acid (FOLVITE) 1  MG tablet Take 1 mg by mouth daily.   Yes [provider]  ipratropium-albuterol (DUONEB) 0.5-2.5 (3) MG/3ML SOLN Take 3 mLs by nebulization every 6 (six) hours as needed (Asthma).   Yes [provider]  lisinopril-hydrochlorothiazide (PRINZIDE,ZESTORETIC) 20-12.5 MG per tablet Take 1 tablet by mouth daily.   Yes [provider]  metoprolol (LOPRESSOR) 100 MG tablet Take 100 mg by mouth 2 (two) times daily.   Yes [provider]  OXYGEN Inhale 2 L into the lungs continuous.   Yes [provider]  Skin Protectants, Misc. (EUCERIN) cream Apply 1 application topically as needed for dry skin.   Yes [provider]  Vitamin D, Ergocalciferol, (DRISDOL) 1.25 MG (50000 UNIT) CAPS capsule Take 50,000 Units by mouth every Monday.   Yes [provider]  methotrexate  (RHEUMATREX) 2.5 MG tablet Take 10 mg by mouth every Tuesday. Caution:Chemotherapy. Protect from light.    [provider]    Inpatient Medications: Scheduled Meds:  aspirin  81 mg Oral BID   atorvastatin  10 mg Oral Daily   budesonide  0.5 mg Nebulization BID   dexamethasone (DECADRON) injection  10 mg Intravenous Once   docusate sodium  100 mg Oral BID   ferrous sulfate  325 mg Oral TID PC   folic acid  1 mg Oral Daily   mometasone-formoterol  2 puff Inhalation BID   Continuous Infusions:  sodium chloride 75 mL/hr at 08/18/20 0203   methocarbamol (ROBAXIN) IV     PRN Meds: acetaminophen, bisacodyl, diphenhydrAMINE, HYDROcodone-acetaminophen, HYDROcodone-acetaminophen, ipratropium-albuterol, menthol-cetylpyridinium **OR** phenol, methocarbamol **OR** methocarbamol (ROBAXIN) IV, metoCLOPramide **OR** metoCLOPramide (REGLAN) injection, morphine injection, ondansetron **OR** ondansetron (ZOFRAN) IV, polyethylene glycol  Allergies:   No Known Allergies  Social History:   Social History   Socioeconomic History   Marital status: Married    Spouse name: Not on file   Number of children: Not on file   Years of education: Not on file   Highest education level: Not on file  Occupational History   Not on file  Tobacco Use   Smoking status: Some Days    Packs/day: 0.50    Years: 40.00    Pack years: 20.00    Types: Cigarettes   Smokeless tobacco: Never  Vaping Use   Vaping Use: Never used  Substance and Sexual Activity   Alcohol use: Yes    Comment: rare   Drug use: Never   Sexual activity: Not on file  Other Topics Concern   Not on file  Social History Narrative   Not on file   Social Determinants of Health   Financial Resource Strain: Not on file  Food Insecurity: Not on file  Transportation Needs: Not on file  Physical Activity: Not on file  Stress: Not on file  Social Connections: Not on file  Intimate Partner Violence: Not on file    Family History:     Family History  Problem Relation Age of Onset   Heart attack Brother      ROS:  Please see the history of present illness.  General:no colds or fevers, no weight changes Skin:no rashes or ulcers HEENT:no blurred vision, no congestion CV:see HPI PUL:see HPI GI:no diarrhea constipation or melena, no indigestion GU:no hematuria, no dysuria MS:+ joint pain, no claudication Neuro:no syncope, no lightheadedness except occ with cough Endo:no diabetes, no thyroid disease  All other ROS reviewed and negative.     Physical Exam/Data:   Vitals:   08/17/20 2200 08/18/20 0214  08/18/20 0620 08/18/20 0811  BP: 123/71 110/68 112/67   Pulse: 77 74 64   Resp: 18 18 19    Temp: 97.9 F (36.6 C) 97.7 F (36.5 C) (!) 97.4 F (36.3 C)   TempSrc: Oral Oral Oral   SpO2: 96% 96% 97% 96%  Weight:      Height:        Intake/Output Summary (Last 24 hours) at 08/18/2020 0855 Last data filed at 08/18/2020 0600 Gross per 24 hour  Intake 3105.52 ml  Output 2000 ml  Net 1105.52 ml   Last 3 Weights 08/17/2020 08/10/2020 03/11/2013  Weight (lbs) 212 lb 8 oz 212 lb 8 oz 225 lb  Weight (kg) 96.389 kg 96.389 kg 102.059 kg     Body mass index is 38.87 kg/m.  General:  Well nourished, well developed, in no acute distressHEENT: normal Lymph: no adenopathy Neck: no JVD Endocrine:  No thryomegaly Vascular: No carotid bruits; FA pulses 2+ bilaterally without bruits  Cardiac:  normal S1, S2; RRR; no murmur gallup rub or click Lungs:  + wheezes occ to auscultation bilaterally, no rhonchi or rales  Abd: soft, nontender, no hepatomegaly  Ext: no edema Musculoskeletal:  No deformities, BUE and BLE strength normal and equal Skin: warm and dry  Neuro:  alert and oriented X 3 MAE follows commands, no focal abnormalities noted Psych:  Normal affect   Relevant CV Studies: none  Laboratory Data:  High Sensitivity Troponin:  No results for input(s): TROPONINIHS in the last 720 hours.   Chemistry Recent  Labs  Lab 08/17/20 1228 08/18/20 0458  NA 136 133*  K 3.3* 3.9  CL 99 101  CO2 28 26  GLUCOSE 124* 191*  BUN 9 11  CREATININE 0.86 0.92  CALCIUM 8.3* 8.5*  GFRNONAA >60 >60  ANIONGAP 9 6    Recent Labs  Lab 08/17/20 1228  PROT 6.5  ALBUMIN 3.0*  AST 23  ALT 18  ALKPHOS 78  BILITOT 0.7   Hematology Recent Labs  Lab 08/17/20 1228 08/18/20 0458  WBC 19.6* 26.1*  RBC 4.22 4.15*  HGB 13.1 13.0  HCT 41.0 39.8  MCV 97.2 95.9  MCH 31.0 31.3  MCHC 32.0 32.7  RDW 15.0 14.6  PLT 287 290   BNPNo results for input(s): BNP, PROBNP in the last 168 hours.  DDimer No results for input(s): DDIMER in the last 168 hours.   Radiology/Studies:  DG Pelvis Portable  Result Date: 08/17/2020 CLINICAL DATA:  Status post right hip replacement today. EXAM: PORTABLE PELVIS 1-2 VIEWS COMPARISON:  Intraoperative imaging today. FINDINGS: Right total hip arthroplasty is in place. No hardware complication or acute bony or joint abnormality is seen. There is some gas in the soft tissues from surgery. IMPRESSION: Status post right hip replacement.  No acute finding. Electronically Signed   By: Drusilla Kanner M.D.   On: 08/17/2020 14:20   DG C-Arm 1-60 Min-No Report  Result Date: 08/17/2020 CLINICAL DATA:  Hip replacement. EXAM: OPERATIVE RIGHT HIP (WITH PELVIS IF PERFORMED) 6 VIEWS TECHNIQUE: Fluoroscopic spot image(s) were submitted for interpretation post-operatively. COMPARISON:  None. FINDINGS: 6 intraoperative spot fluoro films obtained during right total hip replacement. No evidence for immediate hardware complication. IMPRESSION: Intraoperative assessment during right hip replacement. Electronically Signed   By: Kennith Center M.D.   On: 08/17/2020 12:00   DG HIP OPERATIVE UNILAT W OR W/O PELVIS RIGHT  Result Date: 08/17/2020 CLINICAL DATA:  Hip replacement. EXAM: OPERATIVE RIGHT HIP (WITH PELVIS IF PERFORMED) 6  VIEWS TECHNIQUE: Fluoroscopic spot image(s) were submitted for interpretation  post-operatively. COMPARISON:  None. FINDINGS: 6 intraoperative spot fluoro films obtained during right total hip replacement. No evidence for immediate hardware complication. IMPRESSION: Intraoperative assessment during right hip replacement. Electronically Signed   By: Kennith Center M.D.   On: 08/17/2020 12:00     Assessment and Plan:   Reported Sinus brady in 30s.  No strips EKG done later with big. PVCs, if pulse was obtained radially this could explain the brady with PVCs not perfused to wrist.   With hypokalemia at the time as well.  No hx of CAD.  No chest pain, no lightheadedness this admit.  Will  check echo and Mg+ and TSH COPD with inhalers and home 02 prn RA  on methotrexate HLD on lipitor S/p total hip 08/17/20   Risk Assessment/Risk Scores:                For questions or updates, please contact [facility] Please consult [email or web address] for contact info under    Signed, Nada Boozer, NP  08/18/2020 8:55 AM  Personally seen and examined. Agree with above.  65 year old with postop bradycardia.  In review of EKG and telemetry, he was having ventricular bigeminy which is a PVC every other beat.  If automated blood pressure cuff is utilized at that time, it will often only pick up every other beat and record a pulse that is half of the actual pulse.  He was asymptomatic at the time.  I am okay with him stopping his metoprolol.  Lets watch for any rebound tachycardia.  Obtaining echocardiogram.  Preliminarily, pulm function appears normal.  PVCs are noted occasionally during study.  If telemetry remains stable, should be good for discharge tomorrow.  Donato Schultz, MD

## 2020-08-18 NOTE — Progress Notes (Signed)
Subjective: 1 Day Post-Op Procedure(s) (LRB): TOTAL HIP ARTHROPLASTY ANTERIOR APPROACH (Right) Patient reports pain as mild to moderate  Objective: Vital signs in last 24 hours: Temp:  [97.3 F (36.3 C)-98.2 F (36.8 C)] 97.4 F (36.3 C) (06/15 0620) Pulse Rate:  [38-96] 64 (06/15 0620) Resp:  [15-25] 19 (06/15 0620) BP: (97-147)/(52-120) 112/67 (06/15 0620) SpO2:  [90 %-100 %] 97 % (06/15 0620) Weight:  [96.4 kg] 96.4 kg (06/14 0805)  Intake/Output from previous day: 06/14 0701 - 06/15 0700 In: 3105.5 [P.O.:420; I.V.:2285.5; IV Piggyback:400] Out: 2000 [Urine:1450; Blood:550] Intake/Output this shift: No intake/output data recorded.  Recent Labs    08/17/20 1228 08/18/20 0458  HGB 13.1 13.0   Recent Labs    08/17/20 1228 08/18/20 0458  WBC 19.6* 26.1*  RBC 4.22 4.15*  HCT 41.0 39.8  PLT 287 290   Recent Labs    08/17/20 1228 08/18/20 0458  NA 136 133*  K 3.3* 3.9  CL 99 101  CO2 28 26  BUN 9 11  CREATININE 0.86 0.92  GLUCOSE 124* 191*  CALCIUM 8.3* 8.5*   Recent Labs    08/17/20 1228  INR 1.1    Neurovascular intact Incision: dressing C/D/I   Assessment/Plan: 1 Day Post-Op Procedure(s) (LRB): TOTAL HIP ARTHROPLASTY ANTERIOR APPROACH (Right) Advance diet Up with therapy  Telemetry and EKG reveal sinus rhythm without block however he was noted in PACU and on floor initially per nursing to have HR in the 30-40s, hence we transferred to a telemetry bed and have asked that Cardiology lay their eyes on him.  Maybe nothing going on but I think important to at least have it commented on for his PCP or other arranged follow up  Otherwise up with PT, WBAT If progresses with PT and Cardiology signs off then we will plan for a discharge later today     Shelda Pal 08/18/2020, 7:27 AM

## 2020-08-18 NOTE — Plan of Care (Signed)
  Problem: Education: Goal: Knowledge of the prescribed therapeutic regimen will improve Outcome: Progressing Goal: Understanding of discharge needs will improve Outcome: Progressing Goal: Individualized Educational Video(s) Outcome: Progressing   Problem: Activity: Goal: Ability to avoid complications of mobility impairment will improve Outcome: Progressing Goal: Ability to tolerate increased activity will improve Outcome: Progressing   Problem: Clinical Measurements: Goal: Postoperative complications will be avoided or minimized Outcome: Progressing   Problem: Pain Management: Goal: Pain level will decrease with appropriate interventions Outcome: Progressing   Problem: Skin Integrity: Goal: Will show signs of wound healing Outcome: Progressing   Problem: Elimination: Goal: Will not experience complications related to bowel motility Outcome: Progressing Goal: Will not experience complications related to urinary retention Outcome: Progressing   Problem: Elimination: Goal: Will not experience complications related to bowel motility Outcome: Progressing Goal: Will not experience complications related to urinary retention Outcome: Progressing   Problem: Pain Managment: Goal: General experience of comfort will improve Outcome: Progressing   Problem: Safety: Goal: Ability to remain free from injury will improve Outcome: Progressing

## 2020-08-18 NOTE — Care Management Obs Status (Signed)
MEDICARE OBSERVATION STATUS NOTIFICATION   Patient Details  Name: SHALAMAR CRAYS MRN: [id number] Date of Birth: Jul 19, 1955   Medicare Observation Status Notification Given:  Yes    MahabirOlegario Messier, RN 08/18/2020, 9:47 AM

## 2020-08-18 NOTE — Plan of Care (Signed)
Problem: Education: Goal: Knowledge of the prescribed therapeutic regimen will improve 08/18/2020 0108 by Glean Hess, RN Outcome: Progressing 08/18/2020 0107 by Glean Hess, RN Outcome: Progressing Goal: Understanding of discharge needs will improve 08/18/2020 0108 by Glean Hess, RN Outcome: Progressing 08/18/2020 0107 by Glean Hess, RN Outcome: Progressing Goal: Individualized Educational Video(s) 08/18/2020 0108 by Glean Hess, RN Outcome: Progressing 08/18/2020 0107 by Glean Hess, RN Outcome: Progressing   Problem: Activity: Goal: Ability to avoid complications of mobility impairment will improve 08/18/2020 0108 by Glean Hess, RN Outcome: Progressing 08/18/2020 0107 by Glean Hess, RN Outcome: Progressing Goal: Ability to tolerate increased activity will improve 08/18/2020 0108 by Glean Hess, RN Outcome: Progressing 08/18/2020 0107 by Glean Hess, RN Outcome: Progressing   Problem: Clinical Measurements: Goal: Postoperative complications will be avoided or minimized 08/18/2020 0108 by Glean Hess, RN Outcome: Progressing 08/18/2020 0107 by Glean Hess, RN Outcome: Progressing   Problem: Pain Management: Goal: Pain level will decrease with appropriate interventions 08/18/2020 0108 by Glean Hess, RN Outcome: Progressing 08/18/2020 0107 by Glean Hess, RN Outcome: Progressing   Problem: Skin Integrity: Goal: Will show signs of wound healing 08/18/2020 0108 by Glean Hess, RN Outcome: Progressing 08/18/2020 0107 by Glean Hess, RN Outcome: Progressing   Problem: Education: Goal: Knowledge of General Education information will improve Description: Including pain rating scale, medication(s)/side effects and non-pharmacologic comfort measures 08/18/2020 0108 by Glean Hess, RN Outcome: Progressing 08/18/2020 0107 by Glean Hess, RN Outcome: Progressing   Problem: Health Behavior/Discharge Planning: Goal: Ability to manage health-related needs will improve 08/18/2020 0108 by Glean Hess, RN Outcome: Progressing 08/18/2020 0107 by Glean Hess, RN Outcome: Progressing   Problem: Clinical Measurements: Goal: Ability to maintain clinical measurements within normal limits will improve 08/18/2020 0108 by Glean Hess, RN Outcome: Progressing 08/18/2020 0107 by Glean Hess, RN Outcome: Progressing Goal: Will remain free from infection 08/18/2020 0108 by Glean Hess, RN Outcome: Progressing 08/18/2020 0107 by Glean Hess, RN Outcome: Progressing Goal: Diagnostic test results will improve 08/18/2020 0108 by Glean Hess, RN Outcome: Progressing 08/18/2020 0107 by Glean Hess, RN Outcome: Progressing Goal: Respiratory complications will improve 08/18/2020 0108 by Glean Hess, RN Outcome: Progressing 08/18/2020 0107 by Glean Hess, RN Outcome: Progressing Goal: Cardiovascular complication will be avoided 08/18/2020 0108 by Glean Hess, RN Outcome: Progressing 08/18/2020 0107 by Glean Hess, RN Outcome: Progressing   Problem: Activity: Goal: Risk for activity intolerance will decrease 08/18/2020 0108 by Glean Hess, RN Outcome: Progressing 08/18/2020 0107 by Glean Hess, RN Outcome: Progressing   Problem: Nutrition: Goal: Adequate nutrition will be maintained 08/18/2020 0108 by Glean Hess, RN Outcome: Progressing 08/18/2020 0107 by Glean Hess, RN Outcome: Progressing   Problem: Coping: Goal: Level of anxiety will decrease 08/18/2020 0108 by Glean Hess, RN Outcome: Progressing 08/18/2020 0107 by Glean Hess, RN Outcome: Progressing   Problem: Elimination: Goal: Will not experience complications related to bowel motility 08/18/2020 0108 by  Glean Hess, RN Outcome: Progressing 08/18/2020 0107 by Glean Hess, RN Outcome: Progressing Goal: Will not experience complications related to urinary retention 08/18/2020 0108 by Glean Hess, RN Outcome: Progressing 08/18/2020 0107 by Glean Hess, RN Outcome: Progressing   Problem: Pain Managment: Goal: General experience of comfort will improve 08/18/2020 0108 by Glean Hess, RN Outcome: Progressing 08/18/2020 0107 by Glean Hess,  RN Outcome: Progressing   Problem: Safety: Goal: Ability to remain free from injury will improve 08/18/2020 0108 by Glean Hess, RN Outcome: Progressing 08/18/2020 0107 by Glean Hess, RN Outcome: Progressing   Problem: Skin Integrity: Goal: Risk for impaired skin integrity will decrease 08/18/2020 0108 by Glean Hess, RN Outcome: Progressing 08/18/2020 0107 by Glean Hess, RN Outcome: Progressing

## 2020-08-18 NOTE — Evaluation (Signed)
Physical Therapy Evaluation Patient Details Name: Nathan Nicholson MRN: [id number] DOB: [birth year] Today's Date: 08/18/2020   History of Present Illness  65 y.o. male admitted for Right THA direct anterior approach on 08/17/20, seen by cardiology 08/18/2020 for the evaluation of bradycardia post op.  Past medical hx of COPD, COVID 19 03/2020, HTN, RA, DOE Felty syndrome s/p splenectomy, + tobacco  Clinical Impression  Pt is s/p THA resulting in the deficits listed below (see PT Problem List). Pt will benefit from skilled PT to increase their independence and safety with mobility to allow discharge to the venue listed below.  Pt assisted with ambulating in hallway and performed LE exercises upon returning to room.   Pt hopeful for d/c home today pending medical decisions however will need to practice steps prior to d/c.  Pt's HR 68 bpm at rest, 108 bpm with activity and 71 bpm end of session.  SPO2 95% on room air, and pt encouraged to use incentive spirometer.      Follow Up Recommendations Follow surgeon's recommendation for DC plan and follow-up therapies    Equipment Recommendations  None recommended by PT    Recommendations for Other Services       Precautions / Restrictions Precautions Precautions: Fall Restrictions Weight Bearing Restrictions: No      Mobility  Bed Mobility Overal bed mobility: Needs Assistance Bed Mobility: Supine to Sit     Supine to sit: Min assist     General bed mobility comments: assist for Rt LE    Transfers Overall transfer level: Needs assistance Equipment used: Rolling walker (2 wheeled) Transfers: Sit to/from Stand Sit to Stand: Min guard;Min assist         General transfer comment: verbal cues for UE and LE positioning, assist for controlling descent  Ambulation/Gait Ambulation/Gait assistance: Min guard Gait Distance (Feet): 120 Feet Assistive device: Rolling walker (2 wheeled) Gait Pattern/deviations: Step-to pattern;Decreased  stance time - right;Antalgic Gait velocity: decr   General Gait Details: verbal cues for sequenec, RW positioning, posture, step length  Stairs            Wheelchair Mobility    Modified Rankin (Stroke Patients Only)       Balance                                             Pertinent Vitals/Pain Pain Assessment: 0-10 Pain Score: 6  Pain Location: Rt hip Pain Descriptors / Indicators: Sore;Aching Pain Intervention(s): Repositioned;Monitored during session    Home Living Family/patient expects to be discharged to:: Private residence Living Arrangements: Spouse/significant other;Children Available Help at Discharge: Family Type of Home: House Home Access: Stairs to enter Entrance Stairs-Rails: Right Entrance Stairs-Number of Steps: 6 Home Layout: Able to live on main level with bedroom/bathroom Home Equipment: Walker - 2 wheels;Bedside commode;Cane - single point;Crutches      Prior Function Level of Independence: Independent               Hand Dominance        Extremity/Trunk Assessment        Lower Extremity Assessment Lower Extremity Assessment: RLE deficits/detail RLE Deficits / Details: anticipated post op hip weakness, grossly 2+ hip strength throughout, able to perform ankle pumps       Communication   Communication: No difficulties  Cognition Arousal/Alertness: Awake/alert Behavior During Therapy: WFL for tasks assessed/performed Overall  Cognitive Status: Within Functional Limits for tasks assessed                                        General Comments      Exercises Total Joint Exercises Ankle Circles/Pumps: AROM;Both;10 reps Quad Sets: AROM;Both;10 reps Short Arc Quad: AROM;Right;10 reps Heel Slides: AAROM;Right;10 reps Hip ABduction/ADduction: AAROM;Right;10 reps Long Arc Quad: AROM;Right;10 reps;Seated   Assessment/Plan    PT Assessment Patient needs continued PT services  PT Problem  List Decreased strength;Decreased range of motion;Decreased mobility;Decreased knowledge of use of DME;Pain;Decreased activity tolerance       PT Treatment Interventions Stair training;Gait training;Balance training;DME instruction;Therapeutic exercise;Functional mobility training;Therapeutic activities;Patient/family education    PT Goals (Current goals can be found in the Care Plan section)  Acute Rehab PT Goals PT Goal Formulation: With patient Time For Goal Achievement: 08/25/20 Potential to Achieve Goals: Good    Frequency 7X/week   Barriers to discharge        Co-evaluation               AM-PAC PT "6 Clicks" Mobility  Outcome Measure Help needed turning from your back to your side while in a flat bed without using bedrails?: A Little Help needed moving from lying on your back to sitting on the side of a flat bed without using bedrails?: A Little Help needed moving to and from a bed to a chair (including a wheelchair)?: A Little Help needed standing up from a chair using your arms (e.g., wheelchair or bedside chair)?: A Little Help needed to walk in hospital room?: A Little Help needed climbing 3-5 steps with a railing? : A Lot 6 Click Score: 17    End of Session Equipment Utilized During Treatment: Gait belt Activity Tolerance: Patient tolerated treatment well Patient left: in chair;with call bell/phone within reach;with chair alarm set Nurse Communication: Mobility status PT Visit Diagnosis: Other abnormalities of gait and mobility (R26.89)    Time: 7564-3329 PT Time Calculation (min) (ACUTE ONLY): 25 min   Charges:   PT Evaluation $PT Eval Low Complexity: 1 Low PT Treatments $Therapeutic Exercise: 8-22 mins      Thomasene Mohair PT, DPT Acute Rehabilitation Services Pager: [phone] Office: [phone]  Maida Sale E 08/18/2020, 1:11 PM

## 2020-08-18 NOTE — Progress Notes (Signed)
Patient s/p Rt hip repair- PT done. WBAT with assistance

## 2020-08-18 NOTE — Progress Notes (Signed)
Physical Therapy Treatment Patient Details Name: Nathan Nicholson MRN: [id number] DOB: [birth year] Today's Date: 08/18/2020    History of Present Illness 65 y.o. male admitted for Right THA direct anterior approach on 08/17/20, seen by cardiology 08/18/2020 for the evaluation of bradycardia post op.  Past medical hx of COPD, COVID 19 03/2020, HTN, RA, DOE Felty syndrome s/p splenectomy, + tobacco    PT Comments    Pt assisted with ambulating in hallway again and practiced safe stair technique.  Pt's HR elevated to 134 bpm per telemetry and SpO2 93% on room air.  Pt anticipates d/c home tomorrow.  Provided HEP handout.    Follow Up Recommendations  Follow surgeon's recommendation for DC plan and follow-up therapies     Equipment Recommendations  None recommended by PT    Recommendations for Other Services       Precautions / Restrictions Precautions Precautions: Fall Restrictions Weight Bearing Restrictions: No    Mobility  Bed Mobility Overal bed mobility: Needs Assistance Bed Mobility: Supine to Sit;Sit to Supine     Supine to sit: Min assist Sit to supine: Min assist   General bed mobility comments: assist for trunk upright, pt struggling with Rt LE and trunk and required increased time and effort; provided gait belt for Rt LE assist    Transfers Overall transfer level: Needs assistance Equipment used: Rolling walker (2 wheeled) Transfers: Sit to/from Stand Sit to Stand: Min assist         General transfer comment: verbal cues for UE and LE positioning  Ambulation/Gait Ambulation/Gait assistance: Min guard Gait Distance (Feet): 80 Feet Assistive device: Rolling walker (2 wheeled) Gait Pattern/deviations: Step-to pattern;Decreased stance time - right;Antalgic Gait velocity: decr   General Gait Details: verbal cues for sequenec, RW positioning, posture, step length   Stairs Stairs: Yes Stairs assistance: Min guard Stair Management: Step to  pattern;Forwards;With cane;One rail Left Number of Stairs: 3 General stair comments: verbal cues for sequence, safety, technique; performed with left rail and cane in right hand; performed twice   Wheelchair Mobility    Modified Rankin (Stroke Patients Only)       Balance                                            Cognition Arousal/Alertness: Awake/alert Behavior During Therapy: WFL for tasks assessed/performed;Flat affect Overall Cognitive Status: Within Functional Limits for tasks assessed                                        Exercises    General Comments        Pertinent Vitals/Pain Pain Assessment: 0-10 Pain Score: 7  Pain Location: Rt hip Pain Descriptors / Indicators: Sore;Aching Pain Intervention(s): Repositioned;Monitored during session;Ice applied;Patient requesting pain meds-RN notified    Home Living Family/patient expects to be discharged to:: Private residence Living Arrangements: Spouse/significant other;Children Available Help at Discharge: Family Type of Home: House Home Access: Stairs to enter Entrance Stairs-Rails: Right Home Layout: Able to live on main level with bedroom/bathroom Home Equipment: Walker - 2 wheels;Bedside commode;Cane - single point;Crutches      Prior Function Level of Independence: Independent          PT Goals (current goals can now be found in the care plan section) Acute Rehab  PT Goals PT Goal Formulation: With patient Time For Goal Achievement: 08/25/20 Potential to Achieve Goals: Good Progress towards PT goals: Progressing toward goals    Frequency    7X/week      PT Plan Current plan remains appropriate    Co-evaluation              AM-PAC PT "6 Clicks" Mobility   Outcome Measure  Help needed turning from your back to your side while in a flat bed without using bedrails?: A Little Help needed moving from lying on your back to sitting on the side of a flat bed  without using bedrails?: A Little Help needed moving to and from a bed to a chair (including a wheelchair)?: A Little Help needed standing up from a chair using your arms (e.g., wheelchair or bedside chair)?: A Little Help needed to walk in hospital room?: A Little Help needed climbing 3-5 steps with a railing? : A Lot 6 Click Score: 17    End of Session Equipment Utilized During Treatment: Gait belt Activity Tolerance: Patient tolerated treatment well Patient left: with call bell/phone within reach;in bed Nurse Communication: Mobility status PT Visit Diagnosis: Other abnormalities of gait and mobility (R26.89)     Time: 4132-4401 PT Time Calculation (min) (ACUTE ONLY): 21 min  Charges:  $Gait Training: 8-22 mins                      Paulino Door, DPT Acute Rehabilitation Services Pager: [phone] Office: [phone]  Maida Sale E 08/18/2020, 4:39 PM

## 2020-08-18 NOTE — Care Management Obs Status (Signed)
MEDICARE OBSERVATION STATUS NOTIFICATION   Patient Details  Name: Nathan Nicholson MRN: [id number] Date of Birth: 03-11-1955   Medicare Observation Status Notification Given:       Lanier Clam, RN 08/18/2020, 9:47 AM

## 2020-08-19 DIAGNOSIS — Z8616 Personal history of COVID-19: Secondary | ICD-10-CM | POA: Diagnosis not present

## 2020-08-19 DIAGNOSIS — D72829 Elevated white blood cell count, unspecified: Secondary | ICD-10-CM | POA: Diagnosis not present

## 2020-08-19 DIAGNOSIS — Z96649 Presence of unspecified artificial hip joint: Secondary | ICD-10-CM | POA: Diagnosis not present

## 2020-08-19 DIAGNOSIS — F17210 Nicotine dependence, cigarettes, uncomplicated: Secondary | ICD-10-CM | POA: Diagnosis present

## 2020-08-19 DIAGNOSIS — E785 Hyperlipidemia, unspecified: Secondary | ICD-10-CM | POA: Diagnosis present

## 2020-08-19 DIAGNOSIS — Z9081 Acquired absence of spleen: Secondary | ICD-10-CM | POA: Diagnosis not present

## 2020-08-19 DIAGNOSIS — I10 Essential (primary) hypertension: Secondary | ICD-10-CM | POA: Diagnosis not present

## 2020-08-19 DIAGNOSIS — Z8249 Family history of ischemic heart disease and other diseases of the circulatory system: Secondary | ICD-10-CM | POA: Diagnosis not present

## 2020-08-19 DIAGNOSIS — E876 Hypokalemia: Secondary | ICD-10-CM | POA: Diagnosis present

## 2020-08-19 DIAGNOSIS — M069 Rheumatoid arthritis, unspecified: Secondary | ICD-10-CM | POA: Diagnosis not present

## 2020-08-19 DIAGNOSIS — Z7982 Long term (current) use of aspirin: Secondary | ICD-10-CM | POA: Diagnosis not present

## 2020-08-19 DIAGNOSIS — Z7951 Long term (current) use of inhaled steroids: Secondary | ICD-10-CM | POA: Diagnosis not present

## 2020-08-19 DIAGNOSIS — Z6838 Body mass index (BMI) 38.0-38.9, adult: Secondary | ICD-10-CM | POA: Diagnosis not present

## 2020-08-19 DIAGNOSIS — E669 Obesity, unspecified: Secondary | ICD-10-CM | POA: Diagnosis not present

## 2020-08-19 DIAGNOSIS — R001 Bradycardia, unspecified: Secondary | ICD-10-CM | POA: Diagnosis present

## 2020-08-19 DIAGNOSIS — I493 Ventricular premature depolarization: Secondary | ICD-10-CM | POA: Diagnosis not present

## 2020-08-19 DIAGNOSIS — Z9981 Dependence on supplemental oxygen: Secondary | ICD-10-CM | POA: Diagnosis not present

## 2020-08-19 DIAGNOSIS — M1611 Unilateral primary osteoarthritis, right hip: Secondary | ICD-10-CM | POA: Diagnosis not present

## 2020-08-19 DIAGNOSIS — I9789 Other postprocedural complications and disorders of the circulatory system, not elsewhere classified: Secondary | ICD-10-CM | POA: Diagnosis not present

## 2020-08-19 DIAGNOSIS — J449 Chronic obstructive pulmonary disease, unspecified: Secondary | ICD-10-CM | POA: Diagnosis not present

## 2020-08-19 DIAGNOSIS — Z96641 Presence of right artificial hip joint: Secondary | ICD-10-CM

## 2020-08-19 DIAGNOSIS — J9611 Chronic respiratory failure with hypoxia: Secondary | ICD-10-CM | POA: Diagnosis not present

## 2020-08-19 DIAGNOSIS — Z20822 Contact with and (suspected) exposure to covid-19: Secondary | ICD-10-CM | POA: Diagnosis not present

## 2020-08-19 DIAGNOSIS — Z79899 Other long term (current) drug therapy: Secondary | ICD-10-CM | POA: Diagnosis not present

## 2020-08-19 DIAGNOSIS — E871 Hypo-osmolality and hyponatremia: Secondary | ICD-10-CM | POA: Diagnosis not present

## 2020-08-19 LAB — CBC
HCT: 38 % — ABNORMAL LOW (ref 39.0–52.0)
Hemoglobin: 12.3 g/dL — ABNORMAL LOW (ref 13.0–17.0)
MCH: 31 pg (ref 26.0–34.0)
MCHC: 32.4 g/dL (ref 30.0–36.0)
MCV: 95.7 fL (ref 80.0–100.0)
Platelets: 287 10*3/uL (ref 150–400)
RBC: 3.97 MIL/uL — ABNORMAL LOW (ref 4.22–5.81)
RDW: 14.9 % (ref 11.5–15.5)
WBC: 31.3 10*3/uL — ABNORMAL HIGH (ref 4.0–10.5)
nRBC: 0 % (ref 0.0–0.2)

## 2020-08-19 MED ORDER — ASPIRIN 81 MG PO CHEW: 81.0000 mg | CHEWABLE_TABLET | Freq: Two times a day (BID) | ORAL | 0 refills | Status: AC

## 2020-08-19 MED ORDER — HYDROCODONE-ACETAMINOPHEN 7.5-325 MG PO TABS: 1.0000 | ORAL_TABLET | Freq: Four times a day (QID) | ORAL | 0 refills | Status: DC | PRN

## 2020-08-19 MED ORDER — POLYETHYLENE GLYCOL 3350 17 G PO PACK: 17.0000 g | PACK | Freq: Every day | ORAL | 0 refills | Status: DC | PRN

## 2020-08-19 MED ORDER — METHOCARBAMOL 500 MG PO TABS
500.0000 mg | ORAL_TABLET | Freq: Four times a day (QID) | ORAL | 0 refills | Status: DC | PRN
Start: 2020-08-19 — End: 2021-05-05

## 2020-08-19 MED ORDER — DOCUSATE SODIUM 100 MG PO CAPS: 100.0000 mg | ORAL_CAPSULE | Freq: Two times a day (BID) | ORAL | 0 refills | Status: DC

## 2020-08-19 MED ORDER — MAGNESIUM SULFATE 2 GM/50ML IV SOLN
2.0000 g | Freq: Once | INTRAVENOUS | Status: AC
  Administered 2020-08-19: 2 g via INTRAVENOUS
  Filled 2020-08-19: qty 50

## 2020-08-19 NOTE — Progress Notes (Addendum)
AVS reviewed with patient and wife at bedside.  All follow up appointments and new medications discussed. Post surgery care instructions provided. Pt voiced understanding.  IV access removed. Dressing applied. All pt belongings with pt to discharge home with.  Pts wife and daughter will transport pt home via private vehicle. Pt has own portable oxygen tank to wear PRN during ride home.

## 2020-08-19 NOTE — Progress Notes (Signed)
Subjective: 2 Days Post-Op Procedure(s) (LRB): TOTAL HIP ARTHROPLASTY ANTERIOR APPROACH (Right) Patient reports pain as mild to moderate.  Progressed well with therapy yesterday  Objective: Vital signs in last 24 hours: Temp:  [97.5 F (36.4 C)-98.3 F (36.8 C)] 97.8 F (36.6 C) (06/16 0620) Pulse Rate:  [67-91] 68 (06/16 0620) Resp:  [18] 18 (06/16 0620) BP: (116-133)/(54-74) 117/72 (06/16 0620) SpO2:  [92 %-98 %] 96 % (06/16 0620)  Intake/Output from previous day: 06/15 0701 - 06/16 0700 In: -  Out: 1000 [Urine:1000] Intake/Output this shift: Total I/O In: -  Out: 600 [Urine:600]  Recent Labs    08/17/20 1228 08/18/20 0458 08/19/20 0453  HGB 13.1 13.0 12.3*   Recent Labs    08/18/20 0458 08/19/20 0453  WBC 26.1* 31.3*  RBC 4.15* 3.97*  HCT 39.8 38.0*  PLT 290 287   Recent Labs    08/17/20 1228 08/18/20 0458  NA 136 133*  K 3.3* 3.9  CL 99 101  CO2 28 26  BUN 9 11  CREATININE 0.86 0.92  GLUCOSE 124* 191*  CALCIUM 8.3* 8.5*   Recent Labs    08/17/20 1228  INR 1.1    Neurovascular intact Incision: dressing C/D/I   Assessment/Plan: 2 Days Post-Op Procedure(s) (LRB): TOTAL HIP ARTHROPLASTY ANTERIOR APPROACH (Right) Up with therapy  Home today after therapy I appreciate Cardiology work up and insight.  If any recommendations made from a medication standpoint please let us know which and for how long. I have recommended to the patient that he keep an eye on his blood pressure if Metoprolol is held   RTC in 2 weeks ASA for DVT prophylaxis  Anticipated LOS equal to or greater than 2 midnights due to - Age 64 and older with one or more of the following:  - Obesity  - Expected need for hospital services (PT, OT, Nursing) required for safe  discharge  - Anticipated need for postoperative skilled nursing care or inpatient rehab  - Active co-morbidities: Respiratory Failure/COPD OR   - Unanticipated findings during/Post Surgery: Bradycardia  documented necessitating Cardiology consult and work up   - Patient is a high risk of re-admission due to: Pre existing pulmonary status   Shelda Pal 08/19/2020, 6:35 AM

## 2020-08-19 NOTE — Progress Notes (Signed)
Physical Therapy Treatment Patient Details Name: Nathan Nicholson MRN: [id number] DOB: [birth year] Today's Date: 08/19/2020    History of Present Illness 65 y.o. male admitted for Right THA direct anterior approach on 08/17/20, seen by cardiology 08/18/2020 for the evaluation of bradycardia post op.  Past medical hx of COPD, COVID 19 03/2020, HTN, RA, DOE Felty syndrome s/p splenectomy, + tobacco    PT Comments    Pt assisted with ambulating in hallway.  Pt a little shaky in UEs however improved with distance and no physical assist required.  Spo2 dropped to 85% on room air upon return to recliner however quickly improved within 2 minutes to 95% on room air.  Pt reports he wears O2 at night and PRN at home at baseline (RN also notified).  Pt practiced safe stair technique again.  Pt also performed LE exercises in supine and sitting and encouraged to perform standing exercises in a few days once more stable (therapist demonstrated exercises in standing as pt reviewed HEP program).  Pt reports understanding and feels ready for d/c home today.    Follow Up Recommendations  Follow surgeon's recommendation for DC plan and follow-up therapies     Equipment Recommendations  None recommended by PT    Recommendations for Other Services       Precautions / Restrictions Precautions Precautions: Fall    Mobility  Bed Mobility Overal bed mobility: Needs Assistance Bed Mobility: Supine to Sit     Supine to sit: Supervision;HOB elevated     General bed mobility comments: required increased time and effort; provided gait belt for Rt LE assist    Transfers Overall transfer level: Needs assistance Equipment used: Rolling walker (2 wheeled) Transfers: Sit to/from Stand Sit to Stand: Min guard         General transfer comment: verbal cues for UE and LE positioning  Ambulation/Gait Ambulation/Gait assistance: Min guard Gait Distance (Feet): 150 Feet Assistive device: Rolling walker (2  wheeled) Gait Pattern/deviations: Step-to pattern;Decreased stance time - right;Antalgic Gait velocity: decr   General Gait Details: verbal cues for sequenec, RW positioning, posture, step length   Stairs Stairs: Yes Stairs assistance: Min guard Stair Management: Step to pattern;Forwards;With cane;One rail Left Number of Stairs: 3 General stair comments: verbal cues for sequence, safety, technique; performed with left rail and cane in right hand; performed twice   Wheelchair Mobility    Modified Rankin (Stroke Patients Only)       Balance                                            Cognition Arousal/Alertness: Awake/alert Behavior During Therapy: WFL for tasks assessed/performed;Flat affect Overall Cognitive Status: Within Functional Limits for tasks assessed                                        Exercises Total Joint Exercises Ankle Circles/Pumps: AROM;Both;10 reps Quad Sets: AROM;Both;10 reps Heel Slides: AAROM;Right;10 reps Hip ABduction/ADduction: AAROM;Right;10 reps Long Arc Quad: AROM;Right;10 reps;Seated    General Comments        Pertinent Vitals/Pain Pain Assessment: 0-10 Pain Score: 7  Pain Location: Rt hip Pain Descriptors / Indicators: Sore;Aching Pain Intervention(s): Repositioned;Monitored during session    Home Living  Prior Function            PT Goals (current goals can now be found in the care plan section) Progress towards PT goals: Progressing toward goals    Frequency    7X/week      PT Plan Current plan remains appropriate    Co-evaluation              AM-PAC PT "6 Clicks" Mobility   Outcome Measure  Help needed turning from your back to your side while in a flat bed without using bedrails?: A Little Help needed moving from lying on your back to sitting on the side of a flat bed without using bedrails?: A Little Help needed moving to and from a bed  to a chair (including a wheelchair)?: A Little Help needed standing up from a chair using your arms (e.g., wheelchair or bedside chair)?: A Little Help needed to walk in hospital room?: A Little Help needed climbing 3-5 steps with a railing? : A Little 6 Click Score: 18    End of Session Equipment Utilized During Treatment: Gait belt Activity Tolerance: Patient tolerated treatment well Patient left: with call bell/phone within reach;in chair;with chair alarm set Nurse Communication: Mobility status PT Visit Diagnosis: Other abnormalities of gait and mobility (R26.89)     Time: 4098-1191 PT Time Calculation (min) (ACUTE ONLY): 22 min  Charges:  $Gait Training: 8-22 mins                    Paulino Door, DPT Acute Rehabilitation Services Pager: [phone] Office: [phone]  Maida Sale E 08/19/2020, 12:40 PM

## 2020-08-19 NOTE — Progress Notes (Addendum)
Progress Note  Patient Name: Nathan Nicholson Date of Encounter: 08/19/2020  Primary Cardiologist: Donato Schultz, MD  Subjective   Feeling well, no complaints, no awareness of PVCs.  Inpatient Medications    Scheduled Meds:  aspirin  81 mg Oral BID   atorvastatin  10 mg Oral Daily   budesonide  0.5 mg Nebulization BID   docusate sodium  100 mg Oral BID   ferrous sulfate  325 mg Oral TID PC   folic acid  1 mg Oral Daily   mometasone-formoterol  2 puff Inhalation BID   Continuous Infusions:  sodium chloride 75 mL/hr at 08/18/20 0203   magnesium sulfate bolus IVPB     methocarbamol (ROBAXIN) IV     PRN Meds: acetaminophen, bisacodyl, diphenhydrAMINE, HYDROcodone-acetaminophen, HYDROcodone-acetaminophen, ipratropium-albuterol, menthol-cetylpyridinium **OR** phenol, methocarbamol **OR** methocarbamol (ROBAXIN) IV, metoCLOPramide **OR** metoCLOPramide (REGLAN) injection, morphine injection, ondansetron **OR** ondansetron (ZOFRAN) IV, polyethylene glycol   Vital Signs    Vitals:   08/18/20 1354 08/18/20 1949 08/18/20 2055 08/19/20 0620  BP: 133/72  (!) 122/54 117/72  Pulse: 76  91 68  Resp: Temp: (!) 97.5 F (36.4 C)  98.3 F (36.8 C) 97.8 F (36.6 C)  TempSrc: Oral  Oral Oral  SpO2: 98% 95% 92% 96%  Weight:      Height:        Intake/Output Summary (Last 24 hours) at 08/19/2020 1232 Last data filed at 08/19/2020 1100 Gross per 24 hour  Intake 480 ml  Output 1500 ml  Net -1020 ml   Last 3 Weights 08/17/2020 08/10/2020 03/11/2013  Weight (lbs) 212 lb 8 oz 212 lb 8 oz 225 lb  Weight (kg) 96.389 kg 96.389 kg 102.059 kg     Telemetry    NSR occasional PVCs, no bradycardia seen - Personally Reviewed  Physical Exam   GEN: No acute distress.  HEENT: Normocephalic, atraumatic, sclera non-icteric. Neck: No JVD or bruits. Cardiac: RRR no murmurs, rubs, or gallops.  Respiratory: Diminished BS throughout with occasional quiet end expiratory wheezing. Breathing  is unlabored. GI: Soft, nontender, non-distended, BS +x 4. MS: no deformity. Extremities: No clubbing or cyanosis. No edema. Distal pedal pulses are 2+ and equal bilaterally. Neuro:  AAOx3. Follows commands. Psych:  Responds to questions appropriately with a normal affect.  Labs    High Sensitivity Troponin:  No results for input(s): TROPONINIHS in the last 720 hours.    Cardiac EnzymesNo results for input(s): TROPONINI in the last 168 hours. No results for input(s): TROPIPOC in the last 168 hours.   Chemistry Recent Labs  Lab 08/17/20 1228 08/18/20 0458  NA 136 133*  K 3.3* 3.9  CL 99 101  CO2 28 26  GLUCOSE 124* 191*  BUN 9 11  CREATININE 0.86 0.92  CALCIUM 8.3* 8.5*  PROT 6.5  --   ALBUMIN 3.0*  --   AST 23  --   ALT 18  --   ALKPHOS 78  --   BILITOT 0.7  --   GFRNONAA >60 >60  ANIONGAP 9 6     Hematology Recent Labs  Lab 08/17/20 1228 08/18/20 0458 08/19/20 0453  WBC 19.6* 26.1* 31.3*  RBC 4.22 4.15* 3.97*  HGB 13.1 13.0 12.3*  HCT 41.0 39.8 38.0*  MCV 97.2 95.9 95.7  MCH 31.0 31.3 31.0  MCHC 32.0 32.7 32.4  RDW 15.0 14.6 14.9  PLT 287 290 287    BNPNo results for input(s): BNP, PROBNP in the last 168 hours.  DDimer No results for input(s): DDIMER in the last 168 hours.   Radiology    DG Pelvis Portable  Result Date: 08/17/2020 CLINICAL DATA:  Status post right hip replacement today. EXAM: PORTABLE PELVIS 1-2 VIEWS COMPARISON:  Intraoperative imaging today. FINDINGS: Right total hip arthroplasty is in place. No hardware complication or acute bony or joint abnormality is seen. There is some gas in the soft tissues from surgery. IMPRESSION: Status post right hip replacement.  No acute finding. Electronically Signed   By: Drusilla Kanner M.D.   On: 08/17/2020 14:20   ECHOCARDIOGRAM COMPLETE  Result Date: 08/18/2020    ECHOCARDIOGRAM REPORT   Patient Name:   [name] Date of Exam: 08/18/2020 Medical Rec #:  [id number]        Height:       62.0  in Accession #:    [id number]       Weight:       212.5 lb Date of Birth:  June 30, 1955        BSA:          1.962 m Patient Age:    65 years         BP:           116/74 mmHg Patient Gender: M                HR:           67 bpm. Exam Location:  Inpatient Procedure: 2D Echo, Cardiac Doppler and Color Doppler Indications:    Abnormal EKG  History:        Patient has no prior history of Echocardiogram examinations.                 COPD; Risk Factors:Hypertension.  Sonographer:    Shirlean Kelly Referring Phys: [id number] LAURA R INGOLD IMPRESSIONS  1. Left ventricular ejection fraction, by estimation, is 60 to 65%. The left ventricle has normal function. The left ventricle has no regional wall motion abnormalities. Left ventricular diastolic parameters are consistent with Grade I diastolic dysfunction (impaired relaxation).  2. Right ventricular systolic function is normal. The right ventricular size is mildly enlarged. Tricuspid regurgitation signal is inadequate for assessing PA pressure.  3. The mitral valve is grossly normal. No evidence of mitral valve regurgitation. No evidence of mitral stenosis.  4. The aortic valve is tricuspid. Aortic valve regurgitation is not visualized. No aortic stenosis is present.  5. The inferior vena cava is normal in size with greater than 50% respiratory variability, suggesting right atrial pressure of 3 mmHg. Comparison(s): No prior Echocardiogram. FINDINGS  Left Ventricle: Left ventricular ejection fraction, by estimation, is 60 to 65%. The left ventricle has normal function. The left ventricle has no regional wall motion abnormalities. The left ventricular internal cavity size was normal in size. There is  no left ventricular hypertrophy. Left ventricular diastolic parameters are consistent with Grade I diastolic dysfunction (impaired relaxation). Right Ventricle: The right ventricular size is mildly enlarged. No increase in right ventricular wall thickness. Right ventricular  systolic function is normal. Tricuspid regurgitation signal is inadequate for assessing PA pressure. Left Atrium: Left atrial size was normal in size. Right Atrium: Right atrial size was normal in size. Pericardium: There is no evidence of pericardial effusion. Mitral Valve: The mitral valve is grossly normal. Mild mitral annular calcification. No evidence of mitral valve regurgitation. No evidence of mitral valve stenosis. Tricuspid Valve: The tricuspid valve is normal in structure. Tricuspid valve regurgitation is not  demonstrated. No evidence of tricuspid stenosis. Aortic Valve: The aortic valve is tricuspid. Aortic valve regurgitation is not visualized. No aortic stenosis is present. Aortic valve mean gradient measures 6.0 mmHg. Aortic valve peak gradient measures 11.3 mmHg. Aortic valve area, by VTI measures 3.10  cm. Pulmonic Valve: The pulmonic valve was not well visualized. Pulmonic valve regurgitation is not visualized. No evidence of pulmonic stenosis. Aorta: The aortic root and ascending aorta are structurally normal, with no evidence of dilitation. Venous: The inferior vena cava is normal in size with greater than 50% respiratory variability, suggesting right atrial pressure of 3 mmHg. IAS/Shunts: The atrial septum is grossly normal.  LEFT VENTRICLE PLAX 2D LVIDd:         5.00 cm  Diastology LVIDs:         3.30 cm  LV e' medial:    8.27 cm/s LV PW:         1.10 cm  LV E/e' medial:  13.2 LV IVS:        1.00 cm  LV e' lateral:   8.59 cm/s LVOT diam:     2.00 cm  LV E/e' lateral: 12.7 LV SV:         88 LV SV Index:   45 LVOT Area:     3.14 cm  RIGHT VENTRICLE             IVC RV Basal diam:  4.40 cm     IVC diam: 1.90 cm RV S prime:     16.40 cm/s LEFT ATRIUM             Index       RIGHT ATRIUM           Index LA diam:        3.90 cm 1.99 cm/m  RA Area:     17.40 cm LA Vol (A2C):   59.2 ml 30.18 ml/m RA Volume:   52.90 ml  26.97 ml/m LA Vol (A4C):   60.3 ml 30.74 ml/m LA Biplane Vol: 64.2 ml 32.73  ml/m  AORTIC VALVE AV Area (Vmax):    2.47 cm AV Area (Vmean):   2.29 cm AV Area (VTI):     3.10 cm AV Vmax:           168.00 cm/s AV Vmean:          115.000 cm/s AV VTI:            0.283 m AV Peak Grad:      11.3 mmHg AV Mean Grad:      6.0 mmHg LVOT Vmax:         132.00 cm/s LVOT Vmean:        83.700 cm/s LVOT VTI:          0.279 m LVOT/AV VTI ratio: 0.99  AORTA Ao Root diam: 3.40 cm Ao Asc diam:  3.50 cm MITRAL VALVE MV Area (PHT): 3.42 cm     SHUNTS MV Decel Time: 222 msec     Systemic VTI:  0.28 m MV E velocity: 109.00 cm/s  Systemic Diam: 2.00 cm MV A velocity: 87.00 cm/s MV E/A ratio:  1.25 Riley Lam MD Electronically signed by Riley Lam MD Signature Date/Time: 08/18/2020/4:34:45 PM    Final     Cardiac Studies   2d echo 08/18/20  1. Left ventricular ejection fraction, by estimation, is 60 to 65%. The  left ventricle has normal function. The left ventricle has no regional  wall motion abnormalities. Left  ventricular diastolic parameters are  consistent with Grade I diastolic  dysfunction (impaired relaxation).   2. Right ventricular systolic function is normal. The right ventricular  size is mildly enlarged. Tricuspid regurgitation signal is inadequate for  assessing PA pressure.   3. The mitral valve is grossly normal. No evidence of mitral valve  regurgitation. No evidence of mitral stenosis.   4. The aortic valve is tricuspid. Aortic valve regurgitation is not  visualized. No aortic stenosis is present.   5. The inferior vena cava is normal in size with greater than 50%  respiratory variability, suggesting right atrial pressure of 3 mmHg.   Comparison(s): No prior Echocardiogram.   Patient Profile     65 y.o. male with COPD on intermittent O2, HTN, RA, tobacco abuse, Felty syndrome s/p splenectomy, Covid 19 03/2020 who presented for right total hip replacement. Post-operatively HR recorded in 30s-40s but no corresponding strips available from that time. EKG  and telemetry shows NSR with frequent PVCs.  Assessment & Plan    1. 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 Hypokalemia/hypomagnesemia - improved without repletion from 3.3->3.9 (holding HCTZ likely helped) - Mg 1.6 -> will replete with 2g mag sulfate  3. Essential HTN - controlled without any agents on board, may be reasonable to hold at dc and have patient follow BP at home  4. Right THA  - per ortho  Remainder of medical problems per primary service - labs notable for significant leukocytosis from 13->31 - mild hyponatremia  Tentatively arranged follow-up 09/15/20, appt placed on AVS.  For questions or updates, please contact [facility] Please consult [email or web address] for contact info under Cardiology/STEMI.  Signed, 09/17/20, PA-C 08/19/2020, 12:32 PM    Personally seen and examined. Agree with above.  OK for DC I am okay with continuing to hold the metoprolol.  There is no evidence of bradycardia or tachycardia. PVCs are not as frequent.  Given the amount of PVCs that we saw while on metoprolol, this medication strategy may not be the best for him. Continue to monitor blood pressures as outpatient. Consider trial of diltiazem as outpatient to see if this helps with PVC burden if necessary.  I think we can hold off on monitor for now.  We will follow-up.  08/21/2020, MD

## 2020-08-22 ENCOUNTER — Encounter (HOSPITAL_COMMUNITY): Payer: Self-pay | Admitting: Orthopedic Surgery

## 2020-08-22 NOTE — Anesthesia Postprocedure Evaluation (Signed)
Anesthesia Post Note  Patient: Nathan Nicholson  Procedure(s) Performed: TOTAL HIP ARTHROPLASTY ANTERIOR APPROACH (Right: Hip)     Patient location during evaluation: PACU Anesthesia Type: MAC and Spinal Level of consciousness: awake and alert Pain management: pain level controlled Vital Signs Assessment: post-procedure vital signs reviewed and stable Respiratory status: spontaneous breathing, nonlabored ventilation, respiratory function stable and patient connected to nasal cannula oxygen Cardiovascular status: stable and blood pressure returned to baseline Postop Assessment: no apparent nausea or vomiting Anesthetic complications: no   No notable events documented.       Vitals reviewed and stable  Rinoa Garramone

## 2020-08-24 NOTE — Discharge Summary (Signed)
Physician Discharge Summary   Patient ID: Nathan Nicholson MRN: [id number] DOB/AGE: [birth year] 65 y.o.  Admit date: 08/17/2020 Discharge date: 08/19/2020  Primary Diagnosis: Right  hip osteoarthritis.  Admission Diagnoses:  Past Medical History:  Diagnosis Date   COPD (chronic obstructive pulmonary disease) (HCC)    COVID-19    03-2020   Dyspnea    with exertion   Hypertension    Pneumonia    Rheumatoid arthritis [facility])    Discharge Diagnoses:   Active Problems:   S/P right total hip arthroplasty   Status post total replacement of right hip  Estimated body mass index is 38.87 kg/m as calculated from the following:   Height as of this encounter:  (1.575 m).   Weight as of this encounter: 96.4 kg.  Procedure:  Procedure(s) (LRB): TOTAL HIP ARTHROPLASTY ANTERIOR APPROACH (Right)   Consults: none  HPI:  Nathan Nicholson is a 65 y.o. male who had  presented to office for evaluation of right hip pain.  Radiographs revealed  progressive degenerative changes with bone-on-bone  articulation of the  hip joint, including subchondral cystic changes and osteophytes.  The patient had painful limited range of  motion significantly affecting their overall quality of life and function.  The patient was failing to    respond to conservative measures including medications and/or injections and activity modification and at this point was ready  to proceed with more definitive measures.  Consent was obtained for  benefit of pain relief.  Specific risks of infection, DVT, component  failure, dislocation, neurovascular injury, and need for revision surgery were reviewed in the office as well discussion of  the anterior versus posterior approach were reviewed.  Laboratory Data: Admission on 08/17/2020, Discharged on 08/19/2020  Component Date Value Ref Range Status   ABO/RH(D) 08/17/2020    Final                   Value:A POS Performed at [facility], [address], [place], [place] [address]    WBC 08/17/2020 19.6 (A) 4.0 - 10.5 K/uL Final   RBC 08/17/2020 4.22  4.22 - 5.81 MIL/uL Final   Hemoglobin 08/17/2020 13.1  13.0 - 17.0 g/dL Final   HCT 62/13/0865 41.0  39.0 - 52.0 % Final   MCV 08/17/2020 97.2  80.0 - 100.0 fL Final   MCH 08/17/2020 31.0  26.0 - 34.0 pg Final   MCHC 08/17/2020 32.0  30.0 - 36.0 g/dL Final   RDW 78/46/9629 15.0  11.5 - 15.5 % Final   Platelets 08/17/2020 287  150 - 400 K/uL Final   nRBC 08/17/2020 0.0  0.0 - 0.2 % Final   Performed at [facility], [address], [place], [place] [address]   Sodium 08/17/2020 136  135 - 145 mmol/L Final   Potassium 08/17/2020 3.3 (A) 3.5 - 5.1 mmol/L Final   Chloride 08/17/2020 99  98 - 111 mmol/L Final   CO2 08/17/2020 28  22 - 32 mmol/L Final   Glucose, Bld 08/17/2020 124 (A) 70 - 99 mg/dL Final   Glucose reference range applies only to samples taken after fasting for at least 8 hours.   BUN 08/17/2020 9  8 - 23 mg/dL Final   Creatinine, Ser 08/17/2020 0.86  0.61 - 1.24 mg/dL Final   Calcium 32/44/0102 8.3 (A) 8.9 - 10.3 mg/dL Final   Total Protein 72/53/6644 6.5  6.5 - 8.1 g/dL Final   Albumin 03/47/4259 3.0 (A)  3.5 - 5.0 g/dL Final   AST 16/12/9602 23  15 - 41 U/L Final   ALT 08/17/2020 18  0 - 44 U/L Final   Alkaline Phosphatase 08/17/2020 78  38 - 126 U/L Final   Total Bilirubin 08/17/2020 0.7  0.3 - 1.2 mg/dL Final   GFR, Estimated 08/17/2020 >60  >60 mL/min Final   Comment: (NOTE) Calculated using the CKD-EPI Creatinine Equation (2021)    Anion gap 08/17/2020 9  5 - 15 Final   Performed at [facility], [address], [place], [place] [address]   Prothrombin Time 08/17/2020 14.2  11.4 - 15.2 seconds Final   INR 08/17/2020 1.1  0.8 - 1.2 Final   Comment: (NOTE) INR goal varies based on device and disease states. Performed at [facility], [address] [place], [place] [address]    aPTT 08/17/2020 28   24 - 36 seconds Final   Performed at [facility], [address], [place], [place] [address]   WBC 08/18/2020 26.1 (A) 4.0 - 10.5 K/uL Final   RBC 08/18/2020 4.15 (A) 4.22 - 5.81 MIL/uL Final   Hemoglobin 08/18/2020 13.0  13.0 - 17.0 g/dL Final   HCT 62/13/0865 39.8  39.0 - 52.0 % Final   MCV 08/18/2020 95.9  80.0 - 100.0 fL Final   MCH 08/18/2020 31.3  26.0 - 34.0 pg Final   MCHC 08/18/2020 32.7  30.0 - 36.0 g/dL Final   RDW 78/46/9629 14.6  11.5 - 15.5 % Final   Platelets 08/18/2020 290  150 - 400 K/uL Final   nRBC 08/18/2020 0.0  0.0 - 0.2 % Final   Performed at [facility], [address] [place], [place] [address]   Sodium 08/18/2020 133 (A) 135 - 145 mmol/L Final   Potassium 08/18/2020 3.9  3.5 - 5.1 mmol/L Final   Chloride 08/18/2020 101  98 - 111 mmol/L Final   CO2 08/18/2020 26  22 - 32 mmol/L Final   Glucose, Bld 08/18/2020 191 (A) 70 - 99 mg/dL Final   Glucose reference range applies only to samples taken after fasting for at least 8 hours.   BUN 08/18/2020 11  8 - 23 mg/dL Final   Creatinine, Ser 08/18/2020 0.92  0.61 - 1.24 mg/dL Final   Calcium 32/44/0102 8.5 (A) 8.9 - 10.3 mg/dL Final   GFR, Estimated 08/18/2020 >60  >60 mL/min Final   Comment: (NOTE) Calculated using the CKD-EPI Creatinine Equation (2021)    Anion gap 08/18/2020 6  5 - 15 Final   Performed at [facility], [address], [place], [place] [address]   Magnesium 08/18/2020 1.6 (A) 1.7 - 2.4 mg/dL Final   Performed at [facility], [address] [place], [place] [address]   TSH 08/17/2020 0.881  0.350 - 4.500 uIU/mL Final   Comment: Performed by a 3rd Generation assay with a functional sensitivity of <=0.01 uIU/mL. Performed at [facility], [address] [place], [place] [address]    Weight 08/18/2020 3,399.99  oz Final   Height 08/18/2020 62  in Final   BP 08/18/2020 133/72  mmHg Final   S' Lateral  08/18/2020 3.30  cm Final   AR max vel 08/18/2020 2.47  cm2 Final   AV Area VTI 08/18/2020 3.10  cm2 Final   AV Mean grad 08/18/2020 6.0  mmHg Final   AV Peak grad 08/18/2020 11.3  mmHg Final   Ao pk  vel 08/18/2020 1.68  m/s Final   Area-P 1/2 08/18/2020 3.42  cm2 Final   AV Area mean vel 08/18/2020 2.29  cm2 Final   WBC 08/19/2020 31.3 (A) 4.0 - 10.5 K/uL Final   RBC 08/19/2020 3.97 (A) 4.22 - 5.81 MIL/uL Final   Hemoglobin 08/19/2020 12.3 (A) 13.0 - 17.0 g/dL Final   HCT 94/49/6759 38.0 (A) 39.0 - 52.0 % Final   MCV 08/19/2020 95.7  80.0 - 100.0 fL Final   MCH 08/19/2020 31.0  26.0 - 34.0 pg Final   MCHC 08/19/2020 32.4  30.0 - 36.0 g/dL Final   RDW 16/38/4665 14.9  11.5 - 15.5 % Final   Platelets 08/19/2020 287  150 - 400 K/uL Final   nRBC 08/19/2020 0.0  0.0 - 0.2 % Final   Performed at [facility], [address] [place], [place] [address]  Hospital Outpatient Visit on 08/16/2020  Component Date Value Ref Range Status   SARS Coronavirus 2 08/16/2020 NEGATIVE  NEGATIVE Final   Comment: (NOTE) SARS-CoV-2 target nucleic acids are NOT DETECTED.  The SARS-CoV-2 RNA is generally detectable in upper and lower respiratory specimens during the acute phase of infection. Negative results do not preclude SARS-CoV-2 infection, do not rule out co-infections with other pathogens, and should not be used as the sole basis for treatment or other patient management decisions. Negative results must be combined with clinical observations, patient history, and epidemiological information. The expected result is Negative.  Fact Sheet for Patients: HairSlick.no  Fact Sheet for Healthcare Providers: quierodirigir.com  This test is not yet approved or cleared by the [place] FDA and  has been authorized for detection and/or diagnosis of SARS-CoV-2 by FDA under an Emergency Use Authorization (EUA). This EUA will remain   in effect (meaning this test can be used) for the duration of the COVID-19 declaration under Se                          ction 564(b)(1) of the Act, 21 U.S.C. section 360bbb-3(b)(1), unless the authorization is terminated or revoked sooner.  Performed at [facility], [address], [place], [place] [address]   Hospital Outpatient Visit on 08/10/2020  Component Date Value Ref Range Status   Sodium 08/10/2020 134 (A) 135 - 145 mmol/L Final   Potassium 08/10/2020 3.6  3.5 - 5.1 mmol/L Final   Chloride 08/10/2020 97 (A) 98 - 111 mmol/L Final   CO2 08/10/2020 29  22 - 32 mmol/L Final   Glucose, Bld 08/10/2020 110 (A) 70 - 99 mg/dL Final   Glucose reference range applies only to samples taken after fasting for at least 8 hours.   BUN 08/10/2020 13  8 - 23 mg/dL Final   Creatinine, Ser 08/10/2020 0.78  0.61 - 1.24 mg/dL Final   Calcium 39/05/90 9.3  8.9 - 10.3 mg/dL Final   GFR, Estimated 08/10/2020 >60  >60 mL/min Final   Comment: (NOTE) Calculated using the CKD-EPI Creatinine Equation (2021)    Anion gap 08/10/2020 8  5 - 15 Final   Performed at [facility], [address], [place], [place] [address]   MRSA, PCR 08/10/2020 NEGATIVE  NEGATIVE Final   Staphylococcus aureus 08/10/2020 POSITIVE (A) NEGATIVE Final   Comment: (NOTE) The Xpert SA Assay (FDA approved for NASAL specimens in patients 8 years of age and older), is one component of a comprehensive surveillance program. It is not intended to diagnose  infection nor to guide or monitor treatment. Performed at [facility], [address] [place], [place] [address]    WBC 08/10/2020 13.3 (A) 4.0 - 10.5 K/uL Final   RBC 08/10/2020 4.89  4.22 - 5.81 MIL/uL Final   Hemoglobin 08/10/2020 15.3  13.0 - 17.0 g/dL Final   HCT 60/45/4098 46.4  39.0 - 52.0 % Final   MCV 08/10/2020 94.9  80.0 - 100.0 fL Final   MCH 08/10/2020 31.3  26.0 - 34.0 pg Final   MCHC 08/10/2020 33.0  30.0 - 36.0  g/dL Final   RDW 11/91/4782 15.5  11.5 - 15.5 % Final   Platelets 08/10/2020 320  150 - 400 K/uL Final   nRBC 08/10/2020 0.0  0.0 - 0.2 % Final   Performed at [facility], [address], [place], [place] [address]   ABO/RH(D) 08/10/2020 A POS   Final   Antibody Screen 08/10/2020 NEG   Final   Sample Expiration 08/10/2020 08/20/2020,2359   Final   Extend sample reason 08/10/2020    Final                   Value:NO TRANSFUSIONS OR PREGNANCY IN THE PAST 3 MONTHS Performed at [facility], [address], [place], [place] [address]      X-Rays:DG Pelvis Portable  Result Date: 08/17/2020 CLINICAL DATA:  Status post right hip replacement today. EXAM: PORTABLE PELVIS 1-2 VIEWS COMPARISON:  Intraoperative imaging today. FINDINGS: Right total hip arthroplasty is in place. No hardware complication or acute bony or joint abnormality is seen. There is some gas in the soft tissues from surgery. IMPRESSION: Status post right hip replacement.  No acute finding. Electronically Signed   By: Drusilla Kanner M.D.   On: 08/17/2020 14:20   DG C-Arm 1-60 Min-No Report  Result Date: 08/17/2020 CLINICAL DATA:  Hip replacement. EXAM: OPERATIVE RIGHT HIP (WITH PELVIS IF PERFORMED) 6 VIEWS TECHNIQUE: Fluoroscopic spot image(s) were submitted for interpretation post-operatively. COMPARISON:  None. FINDINGS: 6 intraoperative spot fluoro films obtained during right total hip replacement. No evidence for immediate hardware complication. IMPRESSION: Intraoperative assessment during right hip replacement. Electronically Signed   By: Kennith Center M.D.   On: 08/17/2020 12:00   ECHOCARDIOGRAM COMPLETE  Result Date: 08/18/2020    ECHOCARDIOGRAM REPORT   Patient Name:   Nathan Nicholson Date of Exam: 08/18/2020 Medical Rec #:  [id number]        Height:       62.0 in Accession #:    [id number]       Weight:       212.5 lb Date of Birth:  22-Dec-1955        BSA:          1.962 m Patient Age:     65 years         BP:           116/74 mmHg Patient Gender: M                HR:           67 bpm. Exam Location:  Inpatient Procedure: 2D Echo, Cardiac Doppler and Color Doppler Indications:    Abnormal EKG  History:        Patient has no prior history of Echocardiogram examinations.                 COPD; Risk Factors:Hypertension.  Sonographer:    Shirlean Kelly Referring  Phys: [id number] LAURA R INGOLD IMPRESSIONS  1. Left ventricular ejection fraction, by estimation, is 60 to 65%. The left ventricle has normal function. The left ventricle has no regional wall motion abnormalities. Left ventricular diastolic parameters are consistent with Grade I diastolic dysfunction (impaired relaxation).  2. Right ventricular systolic function is normal. The right ventricular size is mildly enlarged. Tricuspid regurgitation signal is inadequate for assessing PA pressure.  3. The mitral valve is grossly normal. No evidence of mitral valve regurgitation. No evidence of mitral stenosis.  4. The aortic valve is tricuspid. Aortic valve regurgitation is not visualized. No aortic stenosis is present.  5. The inferior vena cava is normal in size with greater than 50% respiratory variability, suggesting right atrial pressure of 3 mmHg. Comparison(s): No prior Echocardiogram. FINDINGS  Left Ventricle: Left ventricular ejection fraction, by estimation, is 60 to 65%. The left ventricle has normal function. The left ventricle has no regional wall motion abnormalities. The left ventricular internal cavity size was normal in size. There is  no left ventricular hypertrophy. Left ventricular diastolic parameters are consistent with Grade I diastolic dysfunction (impaired relaxation). Right Ventricle: The right ventricular size is mildly enlarged. No increase in right ventricular wall thickness. Right ventricular systolic function is normal. Tricuspid regurgitation signal is inadequate for assessing PA pressure. Left Atrium: Left atrial size was  normal in size. Right Atrium: Right atrial size was normal in size. Pericardium: There is no evidence of pericardial effusion. Mitral Valve: The mitral valve is grossly normal. Mild mitral annular calcification. No evidence of mitral valve regurgitation. No evidence of mitral valve stenosis. Tricuspid Valve: The tricuspid valve is normal in structure. Tricuspid valve regurgitation is not demonstrated. No evidence of tricuspid stenosis. Aortic Valve: The aortic valve is tricuspid. Aortic valve regurgitation is not visualized. No aortic stenosis is present. Aortic valve mean gradient measures 6.0 mmHg. Aortic valve peak gradient measures 11.3 mmHg. Aortic valve area, by VTI measures 3.10  cm. Pulmonic Valve: The pulmonic valve was not well visualized. Pulmonic valve regurgitation is not visualized. No evidence of pulmonic stenosis. Aorta: The aortic root and ascending aorta are structurally normal, with no evidence of dilitation. Venous: The inferior vena cava is normal in size with greater than 50% respiratory variability, suggesting right atrial pressure of 3 mmHg. IAS/Shunts: The atrial septum is grossly normal.  LEFT VENTRICLE PLAX 2D LVIDd:         5.00 cm  Diastology LVIDs:         3.30 cm  LV e' medial:    8.27 cm/s LV PW:         1.10 cm  LV E/e' medial:  13.2 LV IVS:        1.00 cm  LV e' lateral:   8.59 cm/s LVOT diam:     2.00 cm  LV E/e' lateral: 12.7 LV SV:         88 LV SV Index:   45 LVOT Area:     3.14 cm  RIGHT VENTRICLE             IVC RV Basal diam:  4.40 cm     IVC diam: 1.90 cm RV S prime:     16.40 cm/s LEFT ATRIUM             Index       RIGHT ATRIUM           Index LA diam:        3.90 cm 1.99 cm/m  RA Area:     17.40 cm LA Vol (A2C):   59.2 ml 30.18 ml/m RA Volume:   52.90 ml  26.97 ml/m LA Vol (A4C):   60.3 ml 30.74 ml/m LA Biplane Vol: 64.2 ml 32.73 ml/m  AORTIC VALVE AV Area (Vmax):    2.47 cm AV Area (Vmean):   2.29 cm AV Area (VTI):     3.10 cm AV Vmax:           168.00 cm/s AV  Vmean:          115.000 cm/s AV VTI:            0.283 m AV Peak Grad:      11.3 mmHg AV Mean Grad:      6.0 mmHg LVOT Vmax:         132.00 cm/s LVOT Vmean:        83.700 cm/s LVOT VTI:          0.279 m LVOT/AV VTI ratio: 0.99  AORTA Ao Root diam: 3.40 cm Ao Asc diam:  3.50 cm MITRAL VALVE MV Area (PHT): 3.42 cm     SHUNTS MV Decel Time: 222 msec     Systemic VTI:  0.28 m MV E velocity: 109.00 cm/s  Systemic Diam: 2.00 cm MV A velocity: 87.00 cm/s MV E/A ratio:  1.25 Riley Lam MD Electronically signed by Riley Lam MD Signature Date/Time: 08/18/2020/4:34:45 PM    Final    DG HIP OPERATIVE UNILAT W OR W/O PELVIS RIGHT  Result Date: 08/17/2020 CLINICAL DATA:  Hip replacement. EXAM: OPERATIVE RIGHT HIP (WITH PELVIS IF PERFORMED) 6 VIEWS TECHNIQUE: Fluoroscopic spot image(s) were submitted for interpretation post-operatively. COMPARISON:  None. FINDINGS: 6 intraoperative spot fluoro films obtained during right total hip replacement. No evidence for immediate hardware complication. IMPRESSION: Intraoperative assessment during right hip replacement. Electronically Signed   By: Kennith Center M.D.   On: 08/17/2020 12:00    EKG: Orders placed or performed during the hospital encounter of 08/17/20   EKG 12-Lead   EKG 12-Lead   EKG 12-Lead   EKG 12-Lead     Hospital Course: Nathan Nicholson is a 65 y.o. who was admitted to [facility]. They were brought to the operating room on 08/17/2020 and underwent Procedure(s): TOTAL HIP ARTHROPLASTY ANTERIOR APPROACH.  Patient tolerated the procedure well and was later transferred to the recovery room and then to the orthopaedic floor for postoperative care. They were given PO and IV analgesics for pain control following their surgery. They were given 24 hours of postoperative antibiotics of  Anti-infectives (From admission, onward)    Start     Dose/Rate Route Frequency Ordered Stop   08/17/20 1630  ceFAZolin (ANCEF) IVPB 2g/100 mL premix         2 g 200 mL/hr over 30 Minutes Intravenous Every 6 hours 08/17/20 1354 08/17/20 2215   08/17/20 0745  ceFAZolin (ANCEF) IVPB 2g/100 mL premix        2 g 200 mL/hr over 30 Minutes Intravenous On call to O.R. 08/17/20 [id number] 08/17/20 1031      and started on DVT prophylaxis in the form of Aspirin.   PT and OT were ordered for total joint protocol. Discharge planning consulted to help with postop disposition and equipment needs. Patient had a fair night on the evening of surgery. He was found to have bradycardia in the PACU and on the floor. Cardiology was consulted, patient was transferred to telemetry. They started to get  up OOB with therapy on POD #1.  Continued to work with therapy into POD #2. Pt was seen during rounds on day two and was ready to go home pending progress with therapy. 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 Recommended for metoprolol to be held. Pt worked with therapy for one additional session and was meeting their goals. He was discharged to home later that day in stable condition.  Diet: Regular diet Activity: WBAT Follow-up: in 2 weeks Disposition: Home Discharged Condition: good   Discharge Instructions     Call MD / Call 911   Complete by: As directed    If you experience chest pain or shortness of breath, CALL 911 and be transported to the hospital emergency room.  If you develope a fever above 101 F, pus (white drainage) or increased drainage or redness at the wound, or calf pain, call your surgeon's office.   Call MD / Call 911   Complete by: As directed    If you experience chest pain or shortness of breath, CALL 911 and be transported to the hospital emergency room.  If you develope a fever above 101 F, pus (white drainage) or increased drainage or redness at the wound, or calf pain, call your surgeon's office.   Change dressing   Complete by: As directed    Maintain surgical dressing until follow up in the clinic. If  the edges start to pull up, may reinforce with tape. If the dressing is no longer working, may remove and cover with gauze and tape, but must keep the area dry and clean.  Call with any questions or concerns.   Change dressing   Complete by: As directed    Maintain surgical dressing until follow up in the clinic. If the edges start to pull up, may reinforce with tape. If the dressing is no longer working, may remove and cover with gauze and tape, but must keep the area dry and clean.  Call with any questions or concerns.   Constipation Prevention   Complete by: As directed    Drink plenty of fluids.  Prune juice may be helpful.  You may use a stool softener, such as Colace (over the counter) 100 mg twice a day.  Use MiraLax (over the counter) for constipation as needed.   Constipation Prevention   Complete by: As directed    Drink plenty of fluids.  Prune juice may be helpful.  You may use a stool softener, such as Colace (over the counter) 100 mg twice a day.  Use MiraLax (over the counter) for constipation as needed.   Diet - low sodium heart healthy   Complete by: As directed    Diet - low sodium heart healthy   Complete by: As directed    Discharge instructions   Complete by: As directed    Please monitor your blood pressure at home.  You may notify your primary care provider that you discontinued Metoprolol, and inform them if you have elevated blood pressures in the weeks following surgery.   Increase activity slowly as tolerated   Complete by: As directed    Weight bearing as tolerated with assist device (walker, cane, etc) as directed, use it as long as suggested by your surgeon or therapist, typically at least 4-6 weeks.   Increase activity slowly as tolerated   Complete by: As directed    Weight bearing as tolerated with assist device (walker, cane, etc) as directed, use it as long as  suggested by your surgeon or therapist, typically at least 4-6 weeks.   Post-operative opioid taper  instructions:   Complete by: As directed    POST-OPERATIVE OPIOID TAPER INSTRUCTIONS: It is important to wean off of your opioid medication as soon as possible. If you do not need pain medication after your surgery it is ok to stop day one. Opioids include: Codeine, Hydrocodone(Norco, Vicodin), Oxycodone(Percocet, oxycontin) and hydromorphone amongst others.  Long term and even short term use of opiods can cause: Increased pain response Dependence Constipation Depression Respiratory depression And more.  Withdrawal symptoms can include Flu like symptoms Nausea, vomiting And more Techniques to manage these symptoms Hydrate well Eat regular healthy meals Stay active Use relaxation techniques(deep breathing, meditating, yoga) Do Not substitute Alcohol to help with tapering If you have been on opioids for less than two weeks and do not have pain than it is ok to stop all together.  Plan to wean off of opioids This plan should start within one week post op of your joint replacement. Maintain the same interval or time between taking each dose and first decrease the dose.  Cut the total daily intake of opioids by one tablet each day Next start to increase the time between doses. The last dose that should be eliminated is the evening dose.      Post-operative opioid taper instructions:   Complete by: As directed    POST-OPERATIVE OPIOID TAPER INSTRUCTIONS: It is important to wean off of your opioid medication as soon as possible. If you do not need pain medication after your surgery it is ok to stop day one. Opioids include: Codeine, Hydrocodone(Norco, Vicodin), Oxycodone(Percocet, oxycontin) and hydromorphone amongst others.  Long term and even short term use of opiods can cause: Increased pain response Dependence Constipation Depression Respiratory depression And more.  Withdrawal symptoms can include Flu like symptoms Nausea, vomiting And more Techniques to manage these  symptoms Hydrate well Eat regular healthy meals Stay active Use relaxation techniques(deep breathing, meditating, yoga) Do Not substitute Alcohol to help with tapering If you have been on opioids for less than two weeks and do not have pain than it is ok to stop all together.  Plan to wean off of opioids This plan should start within one week post op of your joint replacement. Maintain the same interval or time between taking each dose and first decrease the dose.  Cut the total daily intake of opioids by one tablet each day Next start to increase the time between doses. The last dose that should be eliminated is the evening dose.      TED hose   Complete by: As directed    Use stockings (TED hose) for 2 weeks on both leg(s).  You may remove them at night for sleeping.   TED hose   Complete by: As directed    Use stockings (TED hose) for 2 weeks on both leg(s).  You may remove them at night for sleeping.      Allergies as of 08/19/2020   No Known Allergies      Medication List     STOP taking these medications    aspirin EC 81 MG tablet Replaced by: aspirin 81 MG chewable tablet   methotrexate 2.5 MG tablet Commonly known as: RHEUMATREX   metoprolol tartrate 100 MG tablet Commonly known as: LOPRESSOR       TAKE these medications    albuterol (2.5 MG/3ML) 0.083% nebulizer solution Commonly known as: PROVENTIL Take 2.5 mg by  nebulization every 6 (six) hours as needed for wheezing or shortness of breath.   aspirin 81 MG chewable tablet Chew 1 tablet (81 mg total) by mouth 2 (two) times daily for 28 days. Replaces: aspirin EC 81 MG tablet   atorvastatin 10 MG tablet Commonly known as: LIPITOR Take 10 mg by mouth daily.   budesonide 0.5 MG/2ML nebulizer solution Commonly known as: PULMICORT Take 0.5 mg by nebulization 2 (two) times daily.   cyanocobalamin 1000 MCG/ML injection Commonly known as: (VITAMIN B-12) Inject 1,000 mcg into the muscle every 30  (thirty) days.   docusate sodium 100 MG capsule Commonly known as: COLACE Take 1 capsule (100 mg total) by mouth 2 (two) times daily.   eucerin cream Apply 1 application topically as needed for dry skin.   Fluticasone-Salmeterol 250-50 MCG/DOSE Aepb Commonly known as: ADVAIR Inhale 2 puffs into the lungs 2 (two) times daily as needed (asthma).   folic acid 1 MG tablet Commonly known as: FOLVITE Take 1 mg by mouth daily.   HYDROcodone-acetaminophen 7.5-325 MG tablet Commonly known as: NORCO Take 1-2 tablets by mouth every 6 (six) hours as needed for severe pain (pain score 7-10).   ipratropium-albuterol 0.5-2.5 (3) MG/3ML Soln Commonly known as: DUONEB Take 3 mLs by nebulization every 6 (six) hours as needed (Asthma).   lisinopril-hydrochlorothiazide 20-12.5 MG tablet Commonly known as: ZESTORETIC Take 1 tablet by mouth daily.   methocarbamol 500 MG tablet Commonly known as: ROBAXIN Take 1 tablet (500 mg total) by mouth every 6 (six) hours as needed for muscle spasms.   OXYGEN Inhale 2 L into the lungs continuous.   polyethylene glycol 17 g packet Commonly known as: MIRALAX / GLYCOLAX Take 17 g by mouth daily as needed for mild constipation.   Vitamin D (Ergocalciferol) 1.25 MG (50000 UNIT) Caps capsule Commonly known as: DRISDOL Take 50,000 Units by mouth every Monday.               Discharge Care Instructions  (From admission, onward)           Start     Ordered   08/19/20 0000  Change dressing       Comments: Maintain surgical dressing until follow up in the clinic. If the edges start to pull up, may reinforce with tape. If the dressing is no longer working, may remove and cover with gauze and tape, but must keep the area dry and clean.  Call with any questions or concerns.   08/19/20 1402   08/19/20 0000  Change dressing       Comments: Maintain surgical dressing until follow up in the clinic. If the edges start to pull up, may reinforce with tape.  If the dressing is no longer working, may remove and cover with gauze and tape, but must keep the area dry and clean.  Call with any questions or concerns.   08/19/20 1402            Follow-up Information     Durene Romans, MD. Schedule an appointment as soon as possible for a visit in 2 week(s).   Specialty: Orthopedic Surgery Contact information: [address] [place] [address] [phone]         Leone Brand, NP Follow up.   Specialties: Cardiology, Radiology Why: [facility] [address] location -a follow-up as been arranged for you on Wednesday September 15, 2020 2:45 PM (Arrive by 2:30 PM). Vernona Rieger is one of the nurse practitioners with our  cardioloy team. Contact information: [address] [place] [address] [phone]                 Signed: Dennie Bible, PA-C Orthopedic Surgery 08/24/2020, 5:14 PM

## 2020-09-03 DIAGNOSIS — J449 Chronic obstructive pulmonary disease, unspecified: Secondary | ICD-10-CM | POA: Diagnosis not present

## 2020-09-06 DIAGNOSIS — J441 Chronic obstructive pulmonary disease with (acute) exacerbation: Secondary | ICD-10-CM | POA: Diagnosis not present

## 2020-09-06 DIAGNOSIS — J9601 Acute respiratory failure with hypoxia: Secondary | ICD-10-CM | POA: Diagnosis not present

## 2020-09-13 ENCOUNTER — Other Ambulatory Visit: Payer: Self-pay

## 2020-09-13 ENCOUNTER — Encounter (HOSPITAL_BASED_OUTPATIENT_CLINIC_OR_DEPARTMENT_OTHER): Payer: Medicare HMO | Attending: Internal Medicine | Admitting: Internal Medicine

## 2020-09-13 DIAGNOSIS — M0579 Rheumatoid arthritis with rheumatoid factor of multiple sites without organ or systems involvement: Secondary | ICD-10-CM | POA: Diagnosis not present

## 2020-09-13 DIAGNOSIS — F17200 Nicotine dependence, unspecified, uncomplicated: Secondary | ICD-10-CM | POA: Diagnosis not present

## 2020-09-13 DIAGNOSIS — J449 Chronic obstructive pulmonary disease, unspecified: Secondary | ICD-10-CM

## 2020-09-13 DIAGNOSIS — S3130XD Unspecified open wound of scrotum and testes, subsequent encounter: Secondary | ICD-10-CM

## 2020-09-13 DIAGNOSIS — S3130XA Unspecified open wound of scrotum and testes, initial encounter: Secondary | ICD-10-CM | POA: Insufficient documentation

## 2020-09-13 DIAGNOSIS — R69 Illness, unspecified: Secondary | ICD-10-CM | POA: Diagnosis not present

## 2020-09-13 DIAGNOSIS — L299 Pruritus, unspecified: Secondary | ICD-10-CM | POA: Diagnosis not present

## 2020-09-13 NOTE — Progress Notes (Signed)
Nathan Nicholson ([id number]) Visit Report for 09/13/2020 Chief Complaint Document Details Patient Name: Date of Service: Nathan Nicholson 09/13/2020 9:00 A M Medical Record Number: [id number] Patient Account Number: [id number] Date of Birth/Sex: Treating RN: [birth year] (65 y.o. Nathan Nicholson Primary Care Provider: Mariana Nicholson Other Clinician: Referring Provider: Treating Provider/Extender: Nathan Nicholson in Treatment: 5 Information Obtained from: Patient Chief Complaint Scrotal wound Electronic Signature(s) Signed: 09/13/2020 12:28:47 PM By: Nathan Corwin DO Entered By: Nathan Nicholson on 09/13/2020 12:22:44 -------------------------------------------------------------------------------- HPI Details Patient Name: Date of Service: Nathan Hughs, [name] 09/13/2020 9:00 A M Medical Record Number: [id number] Patient Account Number: [id number] Date of Birth/Sex: Treating RN: 29-Mar-1955 (65 y.o. Nathan Nicholson Primary Care Provider: Mariana Nicholson Other Clinician: Referring Provider: Treating Provider/Extender: Nathan Nicholson in Treatment: 5 History of Present Illness HPI Description: Admission 6/6 [name] is a 65 year old male with a past medical history of rheumatoid arthritis and COPD that presents to our clinic for a small wound to his scrotum. He was referred by Dr. Jettie Nicholson from [facility] urology. This has been an ongoing issue for the patient For several years. He states he developed pruritus and increased irritation to the area and he scratched the area incessantly and developed a large wound. However The wound has been well-healing with the use of steroid cream and barrier cream. He is currently using Eucerin. He denies signs of infection. 7/11; patient presents for 1 month follow-up. He has been using Eucerin cream to the scrotum. He reports that the wound has healed. He has no issues or complaints  today. Electronic Signature(s) Signed: 09/13/2020 12:28:47 PM By: Nathan Corwin DO Entered By: Nathan Nicholson on 09/13/2020 12:23:29 -------------------------------------------------------------------------------- Physical Exam Details Patient Name: Date of Service: Nathan Hughs, [name] 09/13/2020 9:00 A M Medical Record Number: [id number] Patient Account Number: [id number] Date of Birth/Sex: Treating RN: 02-Jun-1955 (65 y.o. Nathan Nicholson Primary Care Provider: Other Clinician: Mariana Nicholson Referring Provider: Treating Provider/Extender: Nathan Nicholson in Treatment: 5 Constitutional respirations regular, non-labored and within target range for patient.Marland Kitchen Psychiatric pleasant and cooperative. Notes Scrotum: Epithelialization to the previous wound site on the left side. No signs of infection and surrounding skin is intact Electronic Signature(s) Signed: 09/13/2020 12:28:47 PM By: Nathan Corwin DO Entered By: Nathan Nicholson on 09/13/2020 12:24:39 -------------------------------------------------------------------------------- Physician Orders Details Patient Name: Date of Service: Nathan Hughs, [name] 09/13/2020 9:00 A M Medical Record Number: [id number] Patient Account Number: [id number] Date of Birth/Sex: Treating RN: 10-02-1955 (65 y.o. Nathan Nicholson Primary Care Provider: Mariana Nicholson Other Clinician: Referring Provider: Treating Provider/Extender: Nathan Nicholson in Treatment: 5 Verbal / Phone Orders: No Diagnosis Coding ICD-10 Coding Code Description S31.30XD Unspecified open wound of scrotum and testes, subsequent encounter J44.9 Chronic obstructive pulmonary disease, unspecified M05.79 Rheumatoid arthritis with rheumatoid factor of multiple sites without organ or systems involvement Discharge From [facility] Services Discharge from Wound Care Center - -Continue to use Eucerin Cream Electronic Signature(s) Signed:  09/13/2020 12:28:47 PM By: Nathan Corwin DO Entered By: Nathan Nicholson on 09/13/2020 12:27:13 -------------------------------------------------------------------------------- Problem List Details Patient Name: Date of Service: Nathan Hughs, [name] 09/13/2020 9:00 A M Medical Record Number: [id number] Patient Account Number: [id number] Date of Birth/Sex: Treating RN: 1955/05/24 (65 y.o. Nathan Nicholson Primary Care Provider: Mariana Nicholson Other Clinician: Referring Provider: Treating Provider/Extender: Nathan Nicholson in Treatment: 5 Active Problems ICD-10  Encounter Code Description Active Date MDM Diagnosis S31.30XD Unspecified open wound of scrotum and testes, subsequent encounter 09/13/2020 No Yes J44.9 Chronic obstructive pulmonary disease, unspecified 08/09/2020 No Yes M05.79 Rheumatoid arthritis with rheumatoid factor of multiple sites without organ or 08/09/2020 No Yes systems involvement Inactive Problems ICD-10 Code Description Active Date Inactive Date S31.30XA Unspecified open wound of scrotum and testes, initial encounter 08/09/2020 08/09/2020 Resolved Problems Electronic Signature(s) Signed: 09/13/2020 12:28:47 PM By: Nathan Corwin DO Previous Signature: 09/13/2020 9:21:19 AM Version By: Antonieta Iba Entered By: Nathan Nicholson on 09/13/2020 12:26:50 -------------------------------------------------------------------------------- Progress Note Details Patient Name: Date of Service: Nathan Hughs, [name] 09/13/2020 9:00 A M Medical Record Number: [id number] Patient Account Number: [id number] Date of Birth/Sex: Treating RN: [birth year] (65 y.o. Nathan Nicholson Primary Care Provider: Mariana Nicholson Other Clinician: Referring Provider: Treating Provider/Extender: Nathan Nicholson in Treatment: 5 Subjective Chief Complaint Information obtained from Patient Scrotal wound History of Present Illness (HPI) Admission 6/6 Mr.  Nathan Nicholson is a 65 year old male with a past medical history of rheumatoid arthritis and COPD that presents to our clinic for a small wound to his scrotum. He was referred by Dr. Jettie Nicholson from [facility] urology. This has been an ongoing issue for the patient For several years. He states he developed pruritus and increased irritation to the area and he scratched the area incessantly and developed a large wound. However The wound has been well-healing with the use of steroid cream and barrier cream. He is currently using Eucerin. He denies signs of infection. 7/11; patient presents for 1 month follow-up. He has been using Eucerin cream to the scrotum. He reports that the wound has healed. He has no issues or complaints today. Patient History Information obtained from Patient. Family History Unknown History. Social History Current every day smoker, Marital Status - Married, Alcohol Use - Never, Drug Use - No History, Caffeine Use - Moderate. Medical History Respiratory Patient has history of Chronic Obstructive Pulmonary Disease (COPD) Denies history of Aspiration, Asthma, Pneumothorax, Sleep Apnea, Tuberculosis Cardiovascular Patient has history of Hypertension, Phlebitis Denies history of Angina, Arrhythmia, Congestive Heart Failure, Coronary Artery Disease, Deep Vein Thrombosis, Hypotension, Myocardial Infarction, Peripheral Arterial Disease, Peripheral Venous Disease, Vasculitis Integumentary (Skin) Denies history of History of Burn Musculoskeletal Patient has history of Osteoarthritis Denies history of Gout, Rheumatoid Arthritis, Osteomyelitis Hospitalization/Surgery History - foot surery. - hernia surgery. - splenectomy. Medical A Surgical History Notes nd Cardiovascular hypercholesterolemia Objective Constitutional respirations regular, non-labored and within target range for patient.. Vitals Time Taken: 9:01 AM, Height: 62 in, Weight: 220 lbs, BMI: 40.2, Temperature: 97.8  F, Pulse: 87 bpm, Respiratory Rate: 18 breaths/min, Blood Pressure: 135/83 mmHg. Psychiatric pleasant and cooperative. General Notes: Scrotum: Epithelialization to the previous wound site on the left side. No signs of infection and surrounding skin is intact Integumentary (Hair, Skin) Wound #1 status is Healed - Epithelialized. Original cause of wound was Gradually Appeared. The date acquired was: 08/10/2019. The wound has been in treatment 5 weeks. The wound is located on the Scrotum. The wound measures 0cm length x 0cm width x 0cm depth; 0cm^2 area and 0cm^3 volume. There is no tunneling or undermining noted. There is a none present amount of drainage noted. The wound margin is distinct with the outline attached to the wound base. There is no granulation within the wound bed. There is no necrotic tissue within the wound bed. Assessment Active Problems ICD-10 Unspecified open wound of scrotum and testes, subsequent encounter Chronic obstructive  pulmonary disease, unspecified Rheumatoid arthritis with rheumatoid factor of multiple sites without organ or systems involvement Patient has done well with Eucerin cream daily. The wound is closed. I recommended continuing to use barrier cream for at least 1-2 more weeks. He can follow-up as needed Plan Discharge From [facility] Services: Discharge from Wound Care Center - -Continue to use Eucerin Cream 1. Continue Eucerin cream for 1 to 2 weeks 2. Follow-up as needed Electronic Signature(s) Signed: 09/13/2020 12:28:47 PM By: Nathan Corwin DO Entered By: Nathan Nicholson on 09/13/2020 12:27:49 -------------------------------------------------------------------------------- HxROS Details Patient Name: Date of Service: Nathan Hughs, [name] 09/13/2020 9:00 A M Medical Record Number: [id number] Patient Account Number: [id number] Date of Birth/Sex: Treating RN: 04-28-1955 (65 y.o. Nathan Nicholson Primary Care Provider: Mariana Nicholson Other  Clinician: Referring Provider: Treating Provider/Extender: Nathan Nicholson in Treatment: 5 Information Obtained From Patient Respiratory Medical History: Positive for: Chronic Obstructive Pulmonary Disease (COPD) Negative for: Aspiration; Asthma; Pneumothorax; Sleep Apnea; Tuberculosis Cardiovascular Medical History: Positive for: Hypertension; Phlebitis Negative for: Angina; Arrhythmia; Congestive Heart Failure; Coronary Artery Disease; Deep Vein Thrombosis; Hypotension; Myocardial Infarction; Peripheral Arterial Disease; Peripheral Venous Disease; Vasculitis Past Medical History Notes: hypercholesterolemia Integumentary (Skin) Medical History: Negative for: History of Burn Musculoskeletal Medical History: Positive for: Osteoarthritis Negative for: Gout; Rheumatoid Arthritis; Osteomyelitis Immunizations Pneumococcal Vaccine: Received Pneumococcal Vaccination: Yes Immunization Notes: pt. doesn't remember last tetanus shot Implantable Devices None Hospitalization / Surgery History Type of Hospitalization/Surgery foot surery hernia surgery splenectomy Family and Social History Unknown History: Yes; Current every day smoker; Marital Status - Married; Alcohol Use: Never; Drug Use: No History; Caffeine Use: Moderate; Financial Concerns: No; Food, Clothing or Shelter Needs: No; Support System Lacking: No; Transportation Concerns: No Electronic Signature(s) Signed: 09/13/2020 12:28:47 PM By: Nathan Corwin DO Signed: 09/13/2020 5:19:06 PM By: Antonieta Iba Entered By: Nathan Nicholson on 09/13/2020 12:23:34 -------------------------------------------------------------------------------- SuperBill Details Patient Name: Date of Service: Nathan Hughs, [name] 09/13/2020 Medical Record Number: [id number] Patient Account Number: [id number] Date of Birth/Sex: Treating RN: [birth year] (65 y.o. Nathan Nicholson Primary Care Provider: Mariana Nicholson Other  Clinician: Referring Provider: Treating Provider/Extender: Nathan Nicholson in Treatment: 5 Diagnosis Coding ICD-10 Codes Code Description S31.30XD Unspecified open wound of scrotum and testes, subsequent encounter J44.9 Chronic obstructive pulmonary disease, unspecified M05.79 Rheumatoid arthritis with rheumatoid factor of multiple sites without organ or systems involvement Facility Procedures CPT4 Code: [id number] Description: [phone] - WOUND CARE VISIT-LEV 2 EST PT Modifier: Quantity: 1 Physician Procedures : CPT4 Code Description Modifier [id number] - WC PHYS LEVEL 3 - EST PT ICD-10 Diagnosis Description S31.30XD Unspecified open wound of scrotum and testes, subsequent encounter J44.9 Chronic obstructive pulmonary disease, unspecified M05.79 Rheumatoid  arthritis with rheumatoid factor of multiple sites without organ or systems involvement Quantity: 1 Electronic Signature(s) Signed: 09/13/2020 12:28:47 PM By: Nathan Corwin DO Entered By: Nathan Nicholson on 09/13/2020 38:46:65

## 2020-09-14 NOTE — Progress Notes (Addendum)
Nathan Nicholson, Nathan Nicholson ([id number]) Visit Report for 09/13/2020 Arrival Information Details Patient Name: Date of Service: Nathan Nicholson 09/13/2020 9:00 A M Medical Record Number: [id number] Patient Account Number: [id number] Date of Birth/Sex: Treating RN: [birth year] (65 y.o. Nathan Nicholson Primary Care Nathan Nicholson: Nathan Nicholson Other Clinician: Referring Nathan Nicholson: Treating Nathan Nicholson/Extender: Nathan Nicholson in Treatment: 5 Visit Information History Since Last Visit Added or deleted any medications: No Patient Arrived: Ambulatory Any new allergies or adverse reactions: No Arrival Time: 09:00 Had a fall or experienced change in No Accompanied By: wife activities of daily living that may affect Transfer Assistance: None risk of falls: Patient Identification Verified: Yes Signs or symptoms of abuse/neglect since last visito No Secondary Verification Process Completed: Yes Hospitalized since last visit: No Patient Requires Transmission-Based Precautions: No Implantable device outside of the clinic excluding No Patient Has Alerts: No cellular tissue based products placed in the center since last visit: Has Dressing in Place as Prescribed: Yes Pain Present Now: No Electronic Signature(s) Signed: 09/14/2020 2:49:47 PM By: Nathan Nicholson Entered By: Nathan Nicholson on 09/13/2020 09:01:08 -------------------------------------------------------------------------------- Clinic Level of Care Assessment Details Patient Name: Date of Service: Nathan Nicholson 09/13/2020 9:00 A M Medical Record Number: [id number] Patient Account Number: [id number] Date of Birth/Sex: Treating RN: February 02, 1956 (65 y.o. Nathan Nicholson Primary Care Nathan Nicholson: Nathan Nicholson Other Clinician: Referring Keina Mutch: Treating Jaslene Marsteller/Extender: Nathan Nicholson in Treatment: 5 Clinic Level of Care Assessment Items TOOL 4 Quantity Score X- 1 0 Use when only an EandM is  performed on FOLLOW-UP visit ASSESSMENTS - Nursing Assessment / Reassessment X- 1 10 Reassessment of Co-morbidities (includes updates in patient status) X- 1 5 Reassessment of Adherence to Treatment Plan ASSESSMENTS - Wound and Skin A ssessment / Reassessment X - Simple Wound Assessment / Reassessment - one wound 1 5 []  - 0 Complex Wound Assessment / Reassessment - multiple wounds []  - 0 Dermatologic / Skin Assessment (not related to wound area) ASSESSMENTS - Focused Assessment []  - 0 Circumferential Edema Measurements - multi extremities []  - 0 Nutritional Assessment / Counseling / Intervention []  - 0 Lower Extremity Assessment (monofilament, tuning fork, pulses) []  - 0 Peripheral Arterial Disease Assessment (using hand held doppler) ASSESSMENTS - Ostomy and/or Continence Assessment and Care []  - 0 Incontinence Assessment and Management []  - 0 Ostomy Care Assessment and Management (repouching, etc.) PROCESS - Coordination of Care []  - 0 Simple Patient / Family Education for ongoing care X- 1 20 Complex (extensive) Patient / Family Education for ongoing care []  - 0 Staff obtains Chiropractor, Records, T Results / Process Orders est []  - 0 Staff telephones HHA, Nursing Homes / Clarify orders / etc []  - 0 Routine Transfer to another Facility (non-emergent condition) []  - 0 Routine Hospital Admission (non-emergent condition) []  - 0 New Admissions / Manufacturing engineer / Ordering NPWT Apligraf, etc. , []  - 0 Emergency Hospital Admission (emergent condition) []  - 0 Simple Discharge Coordination []  - 0 Complex (extensive) Discharge Coordination PROCESS - Special Needs []  - 0 Pediatric / Minor Patient Management []  - 0 Isolation Patient Management []  - 0 Hearing / Language / Visual special needs []  - 0 Assessment of Community assistance (transportation, D/C planning, etc.) []  - 0 Additional assistance / Altered mentation []  - 0 Support Surface(s) Assessment  (bed, cushion, seat, etc.) INTERVENTIONS - Wound Cleansing / Measurement []  - 0 Simple Wound Cleansing - one wound []  - 0 Complex Wound Cleansing -  multiple wounds X- 1 5 Wound Imaging (photographs - any number of wounds) []  - 0 Wound Tracing (instead of photographs) []  - 0 Simple Wound Measurement - one wound []  - 0 Complex Wound Measurement - multiple wounds INTERVENTIONS - Wound Dressings []  - 0 Small Wound Dressing one or multiple wounds []  - 0 Medium Wound Dressing one or multiple wounds []  - 0 Large Wound Dressing one or multiple wounds []  - 0 Application of Medications - topical []  - 0 Application of Medications - injection INTERVENTIONS - Miscellaneous []  - 0 External ear exam []  - 0 Specimen Collection (cultures, biopsies, blood, body fluids, etc.) []  - 0 Specimen(s) / Culture(s) sent or taken to Lab for analysis []  - 0 Patient Transfer (multiple staff / / Similar devices) []  - 0 Simple Staple / Suture removal (25 or less) []  - 0 Complex Staple / Suture removal (26 or more) []  - 0 Hypo / Hyperglycemic Management (close monitor of Blood Glucose) []  - 0 Ankle / Brachial Index (ABI) - do not check if billed separately X- 1 5 Vital Signs Has the patient been seen at the hospital within the last three years: Yes Total Score: 50 Level Of Care: New/Established - Level 2 Electronic Signature(s) Signed: 09/13/2020 5:19:06 PM By: Entered By: on 09/13/2020 10:01:03 -------------------------------------------------------------------------------- Multi Wound Chart Details Patient Name: Date of Service: , [name] 09/13/2020 9:00 A M Medical Record Number: Patient Account Number: Date of Birth/Sex: Treating RN: 01-May-1955 (65 y.o. Primary Care Jalicia Roszak: Nurse, adult Other Clinician: Referring Nathan Nicholson: Treating Nathan Nicholson/Extender: in  Treatment: 5 Vital Signs Height(in): 62 Pulse(bpm): 87 Weight(lbs): 220 Blood Pressure(mmHg): 135/83 Body Mass Index(BMI): 40 Temperature(F): 97.8 Respiratory Rate(breaths/min): 18 Photos: [1:No Photos Scrotum] [N/A:N/A N/A] Wound Location: [1:Gradually Appeared] [N/A:N/A] Wounding Event: [1:Skin T ear] [N/A:N/A] Primary Etiology: [1:Chronic Obstructive Pulmonary] [N/A:N/A] Comorbid History: [1:Disease (COPD), Hypertension, Phlebitis, Osteoarthritis 08/10/2019] [N/A:N/A] Date Acquired: [1:5] [N/A:N/A] Weeks of Treatment: [1:Healed - Epithelialized] [N/A:N/A] Wound Status: [1:0x0x0] [N/A:N/A] Measurements L x W x D (cm) [1:0] [N/A:N/A] A (cm) : rea [1:0] [N/A:N/A] Volume (cm) : [1:100.00%] [N/A:N/A] % Reduction in Area: [1:100.00%] [N/A:N/A] % Reduction in Volume: [1:Full Thickness Without Exposed] [N/A:N/A] Classification: [1:Support Structures None Present] [N/A:N/A] Exudate Amount: [1:Distinct, outline attached] [N/A:N/A] Wound Margin: [1:None Present (0%)] [N/A:N/A] Granulation Amount: [1:None Present (0%)] [N/A:N/A] Necrotic Amount: [1:Fascia: No] [N/A:N/A] Exposed Structures: [1:Fat Layer (Subcutaneous Tissue): No Tendon: No Muscle: No Joint: No Bone: No Large (67-100%)] [N/A:N/A] Treatment Notes Electronic Signature(s) Signed: 09/13/2020 12:28:47 PM By: DO Signed: 09/13/2020 5:19:06 PM By: Antonieta Iba Entered By: Antonieta Iba on 09/13/2020 12:22:38 -------------------------------------------------------------------------------- Multi-Disciplinary Care Plan Details Patient Name: Date of Service: [name], [name] 09/13/2020 9:00 A M Medical Record Number: [id number] Patient Account Number: [id number] Date of Birth/Sex: Treating RN: [birth year] (65 y.o. Nathan Nicholson Primary Care Ra Pfiester: Nathan Nicholson Other Clinician: Referring Markeesha Char: Treating Izzabell Klasen/Extender: Nathan Nicholson in Treatment: 5 Active  Inactive Electronic Signature(s) Signed: 09/21/2020 12:07:07 PM By: 11/14/2020 Previous Signature: 09/13/2020 9:21:58 AM Version By: 11/14/2020 Entered By: Antonieta Iba on 09/21/2020 12:07:07 -------------------------------------------------------------------------------- Pain Assessment Details Patient Name: Date of Service: 11/14/2020, [name] 09/13/2020 9:00 A M Medical Record Number: 11/14/2020 Patient Account Number: [id number] Date of Birth/Sex: Treating RN: Nov 12, 1955 (65 y.o. 78 Primary Care Jaquaveon Bilal: Nathan Nicholson Other Clinician: Referring Devanee Pomplun: Treating Kelsie Zaborowski/Extender: Nathan Nicholson  Jolly Mango in Treatment: 5 Active Problems Location of Pain Severity and Description of Pain Patient Has Paino No Site Locations Pain Management and Medication Current Pain Management: Electronic Signature(s) Signed: 09/13/2020 5:19:06 PM By: Antonieta Iba Signed: 09/14/2020 2:49:47 PM By: Nathan Nicholson Entered By: Nathan Nicholson on 09/13/2020 09:01:30 -------------------------------------------------------------------------------- Patient/Caregiver Education Details Patient Name: Date of Service: Nathan Nicholson 7/11/2022andnbsp9:00 A M Medical Record Number: [id number] Patient Account Number: [id number] Date of Birth/Gender: Treating RN: [birth year] (65 y.o. Nathan Nicholson Primary Care Physician: Nathan Nicholson Other Clinician: Referring Physician: Treating Physician/Extender: Nathan Nicholson in Treatment: 5 Education Assessment Education Provided To: Patient Education Topics Provided Smoking and Wound Healing: Methods: Explain/Verbal Responses: State content correctly Wound/Skin Impairment: Methods: Explain/Verbal, Printed Responses: State content correctly Electronic Signature(s) Signed: 09/13/2020 5:19:06 PM By: Antonieta Iba Entered By: Antonieta Iba on 09/13/2020  09:22:17 -------------------------------------------------------------------------------- Wound Assessment Details Patient Name: Date of Service: [name], [name] 09/13/2020 9:00 A M Medical Record Number: [id number] Patient Account Number: [id number] Date of Birth/Sex: Treating RN: 10/09/1955 (65 y.o. Nathan Nicholson Primary Care Raef Sprigg: Nathan Nicholson Other Clinician: Referring Harrison Zetina: Treating Khyron Garno/Extender: Nathan Nicholson in Treatment: 5 Wound Status Wound Number: 1 Primary Skin Tear Etiology: Wound Location: Scrotum Wound Healed - Epithelialized Wounding Event: Gradually Appeared Status: Date Acquired: 08/10/2019 Comorbid Chronic Obstructive Pulmonary Disease (COPD), Hypertension, Weeks Of Treatment: 5 History: Phlebitis, Osteoarthritis Clustered Wound: No Wound Measurements Length: (cm) Width: (cm) Depth: (cm) Area: (cm) Volume: (cm) 0 % Reduction in Area: 100% 0 % Reduction in Volume: 100% 0 Epithelialization: Large (67-100%) 0 Tunneling: No 0 Undermining: No Wound Description Classification: Full Thickness Without Exposed Support Structures Wound Margin: Distinct, outline attached Exudate Amount: None Present Foul Odor After Cleansing: No Slough/Fibrino No Wound Bed Granulation Amount: None Present (0%) Exposed Structure Necrotic Amount: None Present (0%) Fascia Exposed: No Fat Layer (Subcutaneous Tissue) Exposed: No Tendon Exposed: No Muscle Exposed: No Joint Exposed: No Bone Exposed: No Electronic Signature(s) Signed: 09/13/2020 5:19:06 PM By: Antonieta Iba Signed: 09/14/2020 5:43:47 PM By: Zandra Abts RN, BSN Entered By: Zandra Abts on 09/13/2020 09:23:52 -------------------------------------------------------------------------------- Vitals Details Patient Name: Date of Service: [name], [name] 09/13/2020 9:00 A M Medical Record Number: [id number] Patient Account Number: [id number] Date of  Birth/Sex: Treating RN: Jan 14, 1956 (65 y.o. Nathan Nicholson Primary Care Evoleht Hovatter: Nathan Nicholson Other Clinician: Referring Shalia Bartko: Treating Idamae Coccia/Extender: Nathan Nicholson in Treatment: 5 Vital Signs Time Taken: 09:01 Temperature (F): 97.8 Height (in): 62 Pulse (bpm): 87 Weight (lbs): 220 Respiratory Rate (breaths/min): 18 Body Mass Index (BMI): 40.2 Blood Pressure (mmHg): 135/83 Reference Range: 80 - 120 mg / dl Electronic Signature(s) Signed: 09/14/2020 2:49:47 PM By: Nathan Nicholson Entered By: Nathan Nicholson on 09/13/2020 09:01:24

## 2020-09-15 ENCOUNTER — Ambulatory Visit: Payer: Medicare HMO | Admitting: Cardiology

## 2020-09-29 DIAGNOSIS — M150 Primary generalized (osteo)arthritis: Secondary | ICD-10-CM | POA: Diagnosis not present

## 2020-09-29 DIAGNOSIS — E669 Obesity, unspecified: Secondary | ICD-10-CM | POA: Diagnosis not present

## 2020-09-29 DIAGNOSIS — M0579 Rheumatoid arthritis with rheumatoid factor of multiple sites without organ or systems involvement: Secondary | ICD-10-CM | POA: Diagnosis not present

## 2020-09-29 DIAGNOSIS — Z6839 Body mass index (BMI) 39.0-39.9, adult: Secondary | ICD-10-CM | POA: Diagnosis not present

## 2020-09-29 DIAGNOSIS — M25561 Pain in right knee: Secondary | ICD-10-CM | POA: Diagnosis not present

## 2020-09-30 ENCOUNTER — Other Ambulatory Visit: Payer: Self-pay

## 2020-09-30 ENCOUNTER — Ambulatory Visit: Payer: Medicare HMO | Admitting: Pulmonary Disease

## 2020-09-30 ENCOUNTER — Encounter: Payer: Self-pay | Admitting: Pulmonary Disease

## 2020-09-30 VITALS — BP 122/84 | HR 112 | Ht 62.0 in | Wt 214.4 lb

## 2020-09-30 DIAGNOSIS — J449 Chronic obstructive pulmonary disease, unspecified: Secondary | ICD-10-CM

## 2020-09-30 DIAGNOSIS — Z720 Tobacco use: Secondary | ICD-10-CM | POA: Diagnosis not present

## 2020-09-30 MED ORDER — BREZTRI AEROSPHERE 160-9-4.8 MCG/ACT IN AERO: 2.0000 | INHALATION_SPRAY | Freq: Two times a day (BID) | RESPIRATORY_TRACT | 0 refills | Status: DC

## 2020-09-30 MED ORDER — BREZTRI AEROSPHERE 160-9-4.8 MCG/ACT IN AERO: 2.0000 | INHALATION_SPRAY | Freq: Two times a day (BID) | RESPIRATORY_TRACT | 6 refills | Status: DC

## 2020-09-30 MED ORDER — PREDNISONE 10 MG PO TABS: ORAL_TABLET | ORAL | 0 refills | Status: AC

## 2020-09-30 NOTE — Patient Instructions (Addendum)
Prednisone Taper: Take 4 tablets (40 mg total) by mouth daily with breakfast for 3 days, THEN 3 tablets (30 mg total) daily with breakfast for 3 days, THEN 2 tablets (20 mg total) daily with breakfast for 3 days, THEN 1 tablet (10 mg total) daily withbreakfast for 3 days.  Start Breztri inhaler 2 puffs twice daily -Rinse mouth out after each use  Continue to use DuoNeb nebulizer treatments as needed or albuterol inhaler as needed.  We will schedule you for follow-up clinic visit with pulmonary function testing in 2 months.  We will refer you to our lung cancer screening program.  Start using nicotine lozenges, over the counter, as needed for smoking cessation. Lozenges are available at [facility] or [facility].

## 2020-09-30 NOTE — Progress Notes (Signed)
Synopsis: Referred in July 2022 for COPD by Lavonia Drafts, FNP  Subjective:   PATIENT ID: Nathan Nicholson GENDER: male DOB: Jun 12, 1955, MRN: [id number]   HPI  Chief Complaint  Patient presents with   Consult    Referred by PCP for history of COPD. He uses 2L of O2 at night. Uses [facility] as his DME.  He denied any breathing concerns today.      Nathan Nicholson is a 65 year old male, daily smoker with hypertension and rheumatoid arthritis who is referred to pulmonary clinic for evaluation of COPD.   He smokes half a pack per day and has smoked for 40 years. He is currently disabled but previously worked in a [employer] and also in a [employer].   He has issues with shortness of breath, cough with sputum production and wheezing. He is currently using adviar diskus 250-79mcg 1 puff twice daily and as needed albuterol. He is using his albuterol inhaler multiple times per day.   He had an absolute eosinophil coung of 900 in 2015.   Past Medical History:  Diagnosis Date   COPD (chronic obstructive pulmonary disease) (HCC)    COVID-19    03-2020   Dyspnea    with exertion   Hypertension    Pneumonia    Rheumatoid arthritis (HCC)      Family History  Problem Relation Age of Onset   Heart attack Brother      Social History   Socioeconomic History   Marital status: Married    Spouse name: Not on file   Number of children: Not on file   Years of education: Not on file   Highest education level: Not on file  Occupational History   Not on file  Tobacco Use   Smoking status: Some Days    Packs/day: 0.50    Years: 40.00    Pack years: 20.00    Types: Cigarettes   Smokeless tobacco: Never  Vaping Use   Vaping Use: Never used  Substance and Sexual Activity   Alcohol use: Yes    Comment: rare   Drug use: Never   Sexual activity: Not on file  Other Topics Concern   Not on file  Social History Narrative   Not on file   Social Determinants of Health   Financial  Resource Strain: Not on file  Food Insecurity: Not on file  Transportation Needs: Not on file  Physical Activity: Not on file  Stress: Not on file  Social Connections: Not on file  Intimate Partner Violence: Not on file     No Known Allergies   Outpatient Medications Prior to Visit  Medication Sig Dispense Refill   albuterol (PROVENTIL) (2.5 MG/3ML) 0.083% nebulizer solution Take 2.5 mg by nebulization every 6 (six) hours as needed for wheezing or shortness of breath.     atorvastatin (LIPITOR) 10 MG tablet Take 10 mg by mouth daily.     budesonide (PULMICORT) 0.5 MG/2ML nebulizer solution Take 0.5 mg by nebulization 2 (two) times daily.     cyanocobalamin (,VITAMIN B-12,) 1000 MCG/ML injection Inject 1,000 mcg into the muscle every 30 (thirty) days.     folic acid (FOLVITE) 1 MG tablet Take 1 mg by mouth daily.     ipratropium-albuterol (DUONEB) 0.5-2.5 (3) MG/3ML SOLN Take 3 mLs by nebulization every 6 (six) hours as needed (Asthma).     lisinopril-hydrochlorothiazide (PRINZIDE,ZESTORETIC) 20-12.5 MG per tablet Take 1 tablet by mouth daily.     methocarbamol (  ROBAXIN) 500 MG tablet Take 1 tablet (500 mg total) by mouth every 6 (six) hours as needed for muscle spasms. 40 tablet 0   methotrexate (RHEUMATREX) 2.5 MG tablet Take 10 mg by mouth once a week.     OXYGEN Inhale 2 L into the lungs continuous.     Skin Protectants, Misc. (EUCERIN) cream Apply 1 application topically as needed for dry skin.     Vitamin D, Ergocalciferol, (DRISDOL) 1.25 MG (50000 UNIT) CAPS capsule Take 50,000 Units by mouth every Monday.     docusate sodium (COLACE) 100 MG capsule Take 1 capsule (100 mg total) by mouth 2 (two) times daily. 10 capsule 0   Fluticasone-Salmeterol (ADVAIR) 250-50 MCG/DOSE AEPB Inhale 2 puffs into the lungs 2 (two) times daily as needed (asthma).     HYDROcodone-acetaminophen (NORCO) 7.5-325 MG tablet Take 1-2 tablets by mouth every 6 (six) hours as needed for severe pain (pain score  7-10). 42 tablet 0   polyethylene glycol (MIRALAX / GLYCOLAX) 17 g packet Take 17 g by mouth daily as needed for mild constipation. 14 each 0   No facility-administered medications prior to visit.    Review of Systems  Constitutional:  Negative for chills, fever, malaise/fatigue and weight loss.  HENT:  Negative for congestion, sinus pain and sore throat.   Eyes: Negative.   Respiratory:  Positive for cough, sputum production, shortness of breath and wheezing. Negative for hemoptysis.   Cardiovascular:  Negative for chest pain, palpitations, orthopnea, claudication and leg swelling.  Gastrointestinal:  Negative for abdominal pain, heartburn, nausea and vomiting.  Genitourinary: Negative.   Musculoskeletal:  Negative for joint pain and myalgias.  Skin:  Negative for rash.  Neurological:  Negative for weakness.  Endo/Heme/Allergies: Negative.   Psychiatric/Behavioral: Negative.     Objective:   Vitals:   09/30/20 1139  BP: 122/84  Pulse: (!) 112  SpO2: 94%  Weight: 214 lb 6.4 oz (97.3 kg)  Height: 5\' 2"  (1.575 m)   Physical Exam Constitutional:      General: He is not in acute distress. HENT:     Head: Normocephalic and atraumatic.  Eyes:     Extraocular Movements: Extraocular movements intact.     Conjunctiva/sclera: Conjunctivae normal.     Pupils: Pupils are equal, round, and reactive to light.  Cardiovascular:     Rate and Rhythm: Normal rate and regular rhythm.     Pulses: Normal pulses.     Heart sounds: Normal heart sounds. No murmur heard. Pulmonary:     Effort: Pulmonary effort is normal.     Breath sounds: Decreased breath sounds and wheezing (diffuse) present. No rhonchi or rales.  Abdominal:     General: Bowel sounds are normal.     Palpations: Abdomen is soft.  Musculoskeletal:     Right lower leg: No edema.     Left lower leg: No edema.  Lymphadenopathy:     Cervical: No cervical adenopathy.  Skin:    General: Skin is warm and dry.  Neurological:      General: No focal deficit present.     Mental Status: He is alert.  Psychiatric:        Mood and Affect: Mood normal.        Behavior: Behavior normal.        Thought Content: Thought content normal.        Judgment: Judgment normal.    CBC    Component Value Date/Time   WBC 31.3 (H) 08/19/2020 08/21/2020  RBC 3.97 (L) 08/19/2020 0453   HGB 12.3 (L) 08/19/2020 0453   HGB 16.3 03/11/2013 0847   HCT 38.0 (L) 08/19/2020 0453   HCT 48.3 03/11/2013 0847   PLT 287 08/19/2020 0453   PLT 372 Large & giant platelets 03/11/2013 0847   MCV 95.7 08/19/2020 0453   MCV 90.2 03/11/2013 0847   MCH 31.0 08/19/2020 0453   MCHC 32.4 08/19/2020 0453   RDW 14.9 08/19/2020 0453   RDW 14.7 (H) 03/11/2013 0847   LYMPHSABS 3.6 (H) 03/11/2013 0847   MONOABS 2.1 (H) 03/11/2013 0847   EOSABS 0.9 (H) 03/11/2013 0847   BASOSABS 0.2 (H) 03/11/2013 0847   BMP Latest Ref Rng & Units 08/18/2020 08/17/2020 08/10/2020  Glucose 70 - 99 mg/dL 253(G) 644(I) 347(Q)  BUN 8 - 23 mg/dL 11 9 13   Creatinine 0.61 - 1.24 mg/dL 2.59 5.63 8.75  Sodium 135 - 145 mmol/L 133(L) 136 134(L)  Potassium 3.5 - 5.1 mmol/L 3.9 3.3(L) 3.6  Chloride 98 - 111 mmol/L 101 99 97(L)  CO2 22 - 32 mmol/L 26 28 29   Calcium 8.9 - 10.3 mg/dL 6.4(P) 8.3(L) 9.3   Chest imaging: CXR 2005 No acute cardiopulmonary findings. Lungs clear bilaterally.   PFT: No flowsheet data found.  Echo 08/18/20: LVEF 60-65%. Grade I diastolic dysfunction. RV systolic function is normal. RV is mildly enlarged.   Assessment & Plan:   Chronic obstructive pulmonary disease, unspecified COPD type (HCC) - Plan: Budeson-Glycopyrrol-Formoterol (BREZTRI AEROSPHERE) 160-9-4.8 MCG/ACT AERO, predniSONE (DELTASONE) 10 MG tablet, Pulmonary Function Test  Tobacco use - Plan: Ambulatory Referral for Lung Cancer Scre  Discussion: Khalif Insixiengmay is a 65 year old male, daily smoker with hypertension and rheumatoid arthritis who is referred to pulmonary clinic for evaluation of  COPD.   He does have diminished breath sounds and wheezing on exam concerning for obstructive lung disease based on his significant smoking history of 20+ pack years.   We will treat him for COPD exacerbation today with steroid taper and azithromycin.   He is to start breztri inhaler therapy 2 puffs twice daily along with as needed albuterol.   We will refer him to our lung cancer screening program. We discussed smoking cessation options and provided instructions on using nicotine lozenges based on his interest.   We will discuss with him about obtaining an overnight oximetry test at the next visit as there is some enlargement of his RV. We can also consider a formal sleep study to evaluate for sleep disordered breathing. We will also check pulmonary function tests at the next follow up visit.   Follow up in 2 months.  Melody Comas, MD [facility] Office: [phone]   Current Outpatient Medications:    albuterol (PROVENTIL) (2.5 MG/3ML) 0.083% nebulizer solution, Take 2.5 mg by nebulization every 6 (six) hours as needed for wheezing or shortness of breath., Disp: , Rfl:    atorvastatin (LIPITOR) 10 MG tablet, Take 10 mg by mouth daily., Disp: , Rfl:    Budeson-Glycopyrrol-Formoterol (BREZTRI AEROSPHERE) 160-9-4.8 MCG/ACT AERO, Inhale 2 puffs into the lungs in the morning and at bedtime., Disp: 10.7 g, Rfl: 6   Budeson-Glycopyrrol-Formoterol (BREZTRI AEROSPHERE) 160-9-4.8 MCG/ACT AERO, Inhale 2 puffs into the lungs in the morning and at bedtime., Disp: 23.6 g, Rfl: 0   budesonide (PULMICORT) 0.5 MG/2ML nebulizer solution, Take 0.5 mg by nebulization 2 (two) times daily., Disp: , Rfl:    cyanocobalamin (,VITAMIN B-12,) 1000 MCG/ML injection, Inject 1,000 mcg into the muscle every  30 (thirty) days., Disp: , Rfl:    folic acid (FOLVITE) 1 MG tablet, Take 1 mg by mouth daily., Disp: , Rfl:    ipratropium-albuterol (DUONEB) 0.5-2.5 (3) MG/3ML SOLN, Take 3 mLs by  nebulization every 6 (six) hours as needed (Asthma)., Disp: , Rfl:    lisinopril-hydrochlorothiazide (PRINZIDE,ZESTORETIC) 20-12.5 MG per tablet, Take 1 tablet by mouth daily., Disp: , Rfl:    methocarbamol (ROBAXIN) 500 MG tablet, Take 1 tablet (500 mg total) by mouth every 6 (six) hours as needed for muscle spasms., Disp: 40 tablet, Rfl: 0   methotrexate (RHEUMATREX) 2.5 MG tablet, Take 10 mg by mouth once a week., Disp: , Rfl:    OXYGEN, Inhale 2 L into the lungs continuous., Disp: , Rfl:    predniSONE (DELTASONE) 10 MG tablet, Take 4 tablets (40 mg total) by mouth daily with breakfast for 3 days, THEN 3 tablets (30 mg total) daily with breakfast for 3 days, THEN 2 tablets (20 mg total) daily with breakfast for 3 days, THEN 1 tablet (10 mg total) daily with breakfast for 3 days., Disp: 30 tablet, Rfl: 0   Skin Protectants, Misc. (EUCERIN) cream, Apply 1 application topically as needed for dry skin., Disp: , Rfl:    Vitamin D, Ergocalciferol, (DRISDOL) 1.25 MG (50000 UNIT) CAPS capsule, Take 50,000 Units by mouth every Monday., Disp: , Rfl:

## 2020-10-01 DIAGNOSIS — M25561 Pain in right knee: Secondary | ICD-10-CM | POA: Diagnosis not present

## 2020-10-01 DIAGNOSIS — Z96641 Presence of right artificial hip joint: Secondary | ICD-10-CM | POA: Diagnosis not present

## 2020-10-01 DIAGNOSIS — M2241 Chondromalacia patellae, right knee: Secondary | ICD-10-CM | POA: Diagnosis not present

## 2020-10-01 DIAGNOSIS — Z471 Aftercare following joint replacement surgery: Secondary | ICD-10-CM | POA: Diagnosis not present

## 2020-10-05 ENCOUNTER — Encounter: Payer: Self-pay | Admitting: Pulmonary Disease

## 2020-10-07 DIAGNOSIS — J441 Chronic obstructive pulmonary disease with (acute) exacerbation: Secondary | ICD-10-CM | POA: Diagnosis not present

## 2020-10-07 DIAGNOSIS — J9601 Acute respiratory failure with hypoxia: Secondary | ICD-10-CM | POA: Diagnosis not present

## 2020-10-13 DIAGNOSIS — J449 Chronic obstructive pulmonary disease, unspecified: Secondary | ICD-10-CM | POA: Diagnosis not present

## 2020-10-20 NOTE — Progress Notes (Deleted)
Cardiology Office Note   Date:  10/20/2020   ID:  Nathan Nicholson, DOB [birth year], MRN [id number]  PCP:  Gordy Councilman, FNP  Cardiologist:  Dr. Anne Fu    No chief complaint on file.  Post hospital   History of Present Illness: Nathan Nicholson is a 65 y.o. male who presents for ***   hx of COPD, COVID 19 03/2020, HTN, RA, DOE Felty syndrome s/p splenectomy, + tobacco and Rt hip pain due to arthritis with need for total hip which he underwent 08/17/20  who is being seen 08/18/2020 for the evaluation of bradycardia no prioe cardiac disease.   HR noted to be 38 at 1401 on vs sheet. On EKG SR with big PVCs -would assume radial pulse did not pick up PVCs.   No ST changes on EKG.  I have no old EKGs to compare.  BP was stable, soft post op but no acute drops.  Pt's lopressor was stopped.  His lisinopril hct was held with soft BP. BP soft before HR drop per vs sheet.  Pt was on lopresor 100 mg BID. For his COPD he does use home 02 at times.   BB stopped and maybe add dilt in future  Past Medical History:  Diagnosis Date   COPD (chronic obstructive pulmonary disease) (HCC)    COVID-19    03-2020   Dyspnea    with exertion   Hypertension    Pneumonia    Rheumatoid arthritis (HCC)     Past Surgical History:  Procedure Laterality Date   FOOT SURGERY     left   HERNIA REPAIR     SPLENECTOMY     TOOTH EXTRACTION     TOTAL HIP ARTHROPLASTY Right 08/17/2020   Procedure: TOTAL HIP ARTHROPLASTY ANTERIOR APPROACH;  Surgeon: Nathan Romans, MD;  Location: [facility];  Service: Orthopedics;  Laterality: Right;  70 min     Current Outpatient Medications  Medication Sig Dispense Refill   albuterol (PROVENTIL) (2.5 MG/3ML) 0.083% nebulizer solution Take 2.5 mg by nebulization every 6 (six) hours as needed for wheezing or shortness of breath.     atorvastatin (LIPITOR) 10 MG tablet Take 10 mg by mouth daily.     Budeson-Glycopyrrol-Formoterol (BREZTRI AEROSPHERE) 160-9-4.8 MCG/ACT AERO Inhale 2  puffs into the lungs in the morning and at bedtime. 10.7 g 6   Budeson-Glycopyrrol-Formoterol (BREZTRI AEROSPHERE) 160-9-4.8 MCG/ACT AERO Inhale 2 puffs into the lungs in the morning and at bedtime. 23.6 g 0   budesonide (PULMICORT) 0.5 MG/2ML nebulizer solution Take 0.5 mg by nebulization 2 (two) times daily.     cyanocobalamin (,VITAMIN B-12,) 1000 MCG/ML injection Inject 1,000 mcg into the muscle every 30 (thirty) days.     folic acid (FOLVITE) 1 MG tablet Take 1 mg by mouth daily.     ipratropium-albuterol (DUONEB) 0.5-2.5 (3) MG/3ML SOLN Take 3 mLs by nebulization every 6 (six) hours as needed (Asthma).     lisinopril-hydrochlorothiazide (PRINZIDE,ZESTORETIC) 20-12.5 MG per tablet Take 1 tablet by mouth daily.     methocarbamol (ROBAXIN) 500 MG tablet Take 1 tablet (500 mg total) by mouth every 6 (six) hours as needed for muscle spasms. 40 tablet 0   methotrexate (RHEUMATREX) 2.5 MG tablet Take 10 mg by mouth once a week.     OXYGEN Inhale 2 L into the lungs continuous.     Skin Protectants, Misc. (EUCERIN) cream Apply 1 application topically as needed for dry skin.     Vitamin D, Ergocalciferol, (  DRISDOL) 1.25 MG (50000 UNIT) CAPS capsule Take 50,000 Units by mouth every Monday.     No current facility-administered medications for this visit.    Allergies:   Patient has no known allergies.    Social History:  The patient  reports that he has been smoking cigarettes. He has a 20.00 pack-year smoking history. He has never used smokeless tobacco. He reports current alcohol use. He reports that he does not use drugs.   Family History:  The patient's ***family history includes Heart attack in his brother.    ROS:  General:no colds or fevers, no weight changes Skin:no rashes or ulcers HEENT:no blurred vision, no congestion CV:see HPI PUL:see HPI GI:no diarrhea constipation or melena, no indigestion GU:no hematuria, no dysuria MS:no joint pain, no claudication Neuro:no syncope, no  lightheadedness Endo:no diabetes, no thyroid disease Wt Readings from Last 3 Encounters:  09/30/20 214 lb 6.4 oz (97.3 kg)  08/17/20 212 lb 8 oz (96.4 kg)  08/10/20 212 lb 8 oz (96.4 kg)     PHYSICAL EXAM: VS:  There were no vitals taken for this visit. , BMI There is no height or weight on file to calculate BMI. General:Pleasant affect, NAD Skin:Warm and dry, brisk capillary refill HEENT:normocephalic, sclera clear, mucus membranes moist Neck:supple, no JVD, no bruits  Heart:S1S2 RRR without murmur, gallup, rub or click Lungs:clear without rales, rhonchi, or wheezes ZOX:WRUE, non tender, + BS, do not palpate liver spleen or masses Ext:no lower ext edema, 2+ pedal pulses, 2+ radial pulses Neuro:alert and oriented, MAE, follows commands, + facial symmetry    EKG:  EKG is ordered today. The ekg ordered today demonstrates ***   Recent Labs: 08/17/2020: ALT 18; TSH 0.881 08/18/2020: BUN 11; Creatinine, Ser 0.92; Magnesium 1.6; Potassium 3.9; Sodium 133 08/19/2020: Hemoglobin 12.3; Platelets 287    Lipid Panel No results found for: CHOL, TRIG, HDL, CHOLHDL, VLDL, LDLCALC, LDLDIRECT     Other studies Reviewed: Additional studies/ records that were reviewed today include: ***.  IMPRESSIONS     1. Left ventricular ejection fraction, by estimation, is 60 to 65%. The  left ventricle has normal function. The left ventricle has no regional  wall motion abnormalities. Left ventricular diastolic parameters are  consistent with Grade I diastolic  dysfunction (impaired relaxation).   2. Right ventricular systolic function is normal. The right ventricular  size is mildly enlarged. Tricuspid regurgitation signal is inadequate for  assessing PA pressure.   3. The mitral valve is grossly normal. No evidence of mitral valve  regurgitation. No evidence of mitral stenosis.   4. The aortic valve is tricuspid. Aortic valve regurgitation is not  visualized. No aortic stenosis is present.    5. The inferior vena cava is normal in size with greater than 50%  respiratory variability, suggesting right atrial pressure of 3 mmHg.   Comparison(s): No prior Echocardiogram.   FINDINGS   Left Ventricle: Left ventricular ejection fraction, by estimation, is 60  to 65%. The left ventricle has normal function. The left ventricle has no  regional wall motion abnormalities. The left ventricular internal cavity  size was normal in size. There is   no left ventricular hypertrophy. Left ventricular diastolic parameters  are consistent with Grade I diastolic dysfunction (impaired relaxation).   Right Ventricle: The right ventricular size is mildly enlarged. No  increase in right ventricular wall thickness. Right ventricular systolic  function is normal. Tricuspid regurgitation signal is inadequate for  assessing PA pressure.   Left Atrium: Left atrial  size was normal in size.   Right Atrium: Right atrial size was normal in size.   Pericardium: There is no evidence of pericardial effusion.   Mitral Valve: The mitral valve is grossly normal. Mild mitral annular  calcification. No evidence of mitral valve regurgitation. No evidence of  mitral valve stenosis.   Tricuspid Valve: The tricuspid valve is normal in structure. Tricuspid  valve regurgitation is not demonstrated. No evidence of tricuspid  stenosis.   Aortic Valve: The aortic valve is tricuspid. Aortic valve regurgitation is  not visualized. No aortic stenosis is present. Aortic valve mean gradient  measures 6.0 mmHg. Aortic valve peak gradient measures 11.3 mmHg. Aortic  valve area, by VTI measures 3.10   cm.   Pulmonic Valve: The pulmonic valve was not well visualized. Pulmonic valve  regurgitation is not visualized. No evidence of pulmonic stenosis.   Aorta: The aortic root and ascending aorta are structurally normal, with  no evidence of dilitation.  ASSESSMENT AND PLAN:  1.  ***   Current medicines are reviewed  with the patient today.  The patient Has no concerns regarding medicines.  The following changes have been made:  See above Labs/ tests ordered today include:see above  Disposition:   FU:  see above  Signed, Nada Boozer, NP  10/20/2020 10:18 PM    [facility] [address] [place], [place], [place]  [address]/ [address] [place], [place] Phone: [phone]; Fax: [phone]  [phone]

## 2020-10-21 ENCOUNTER — Ambulatory Visit: Payer: Medicare HMO | Admitting: Cardiology

## 2020-10-25 DIAGNOSIS — Z471 Aftercare following joint replacement surgery: Secondary | ICD-10-CM | POA: Diagnosis not present

## 2020-10-25 DIAGNOSIS — Z96641 Presence of right artificial hip joint: Secondary | ICD-10-CM | POA: Diagnosis not present

## 2020-11-01 ENCOUNTER — Other Ambulatory Visit: Payer: Self-pay | Admitting: *Deleted

## 2020-11-01 DIAGNOSIS — F17210 Nicotine dependence, cigarettes, uncomplicated: Secondary | ICD-10-CM

## 2020-11-01 DIAGNOSIS — Z87891 Personal history of nicotine dependence: Secondary | ICD-10-CM

## 2020-11-07 DIAGNOSIS — J9601 Acute respiratory failure with hypoxia: Secondary | ICD-10-CM | POA: Diagnosis not present

## 2020-11-07 DIAGNOSIS — J441 Chronic obstructive pulmonary disease with (acute) exacerbation: Secondary | ICD-10-CM | POA: Diagnosis not present

## 2020-11-09 DIAGNOSIS — J449 Chronic obstructive pulmonary disease, unspecified: Secondary | ICD-10-CM | POA: Diagnosis not present

## 2020-11-22 DIAGNOSIS — Z6838 Body mass index (BMI) 38.0-38.9, adult: Secondary | ICD-10-CM | POA: Diagnosis not present

## 2020-11-22 DIAGNOSIS — J449 Chronic obstructive pulmonary disease, unspecified: Secondary | ICD-10-CM | POA: Diagnosis not present

## 2020-11-22 DIAGNOSIS — D72829 Elevated white blood cell count, unspecified: Secondary | ICD-10-CM | POA: Diagnosis not present

## 2020-11-22 DIAGNOSIS — I10 Essential (primary) hypertension: Secondary | ICD-10-CM | POA: Diagnosis not present

## 2020-11-22 DIAGNOSIS — Z125 Encounter for screening for malignant neoplasm of prostate: Secondary | ICD-10-CM | POA: Diagnosis not present

## 2020-11-22 DIAGNOSIS — R739 Hyperglycemia, unspecified: Secondary | ICD-10-CM | POA: Diagnosis not present

## 2020-11-22 DIAGNOSIS — E782 Mixed hyperlipidemia: Secondary | ICD-10-CM | POA: Diagnosis not present

## 2020-11-22 DIAGNOSIS — E538 Deficiency of other specified B group vitamins: Secondary | ICD-10-CM | POA: Diagnosis not present

## 2020-11-24 ENCOUNTER — Encounter: Payer: Medicare HMO | Admitting: Acute Care

## 2020-12-01 ENCOUNTER — Ambulatory Visit: Payer: Medicare HMO | Admitting: Pulmonary Disease

## 2020-12-01 DIAGNOSIS — D72829 Elevated white blood cell count, unspecified: Secondary | ICD-10-CM | POA: Diagnosis not present

## 2020-12-01 NOTE — Progress Notes (Deleted)
Synopsis: Referred in July 2022 for COPD by Lavonia Drafts, FNP  Subjective:   PATIENT ID: Nathan Nicholson: male DOB: 01-Apr-1955, MRN: [id number]   HPI  No chief complaint on file.   Nathan Nicholson is a 65 year old male, daily smoker with hypertension and rheumatoid arthritis who is referred to pulmonary clinic for evaluation of COPD.   He smokes half a pack per day and has smoked for 40 years. He is currently disabled but previously worked in a [employer] and also in a [employer].   He has issues with shortness of breath, cough with sputum production and wheezing. He is currently using adviar diskus 250-57mcg 1 puff twice daily and as needed albuterol. He is using his albuterol inhaler multiple times per day.   He had an absolute eosinophil coung of 900 in 2015.   Past Medical History:  Diagnosis Date   COPD (chronic obstructive pulmonary disease) (HCC)    COVID-19    03-2020   Dyspnea    with exertion   Hypertension    Pneumonia    Rheumatoid arthritis (HCC)      Family History  Problem Relation Age of Onset   Heart attack Brother      Social History   Socioeconomic History   Marital status: Married    Spouse name: Not on file   Number of children: Not on file   Years of education: Not on file   Highest education level: Not on file  Occupational History   Not on file  Tobacco Use   Smoking status: Some Days    Packs/day: 0.50    Years: 40.00    Pack years: 20.00    Types: Cigarettes   Smokeless tobacco: Never  Vaping Use   Vaping Use: Never used  Substance and Sexual Activity   Alcohol use: Yes    Comment: rare   Drug use: Never   Sexual activity: Not on file  Other Topics Concern   Not on file  Social History Narrative   Not on file   Social Determinants of Health   Financial Resource Strain: Not on file  Food Insecurity: Not on file  Transportation Needs: Not on file  Physical Activity: Not on file  Stress: Not on file  Social  Connections: Not on file  Intimate Partner Violence: Not on file     No Known Allergies   Outpatient Medications Prior to Visit  Medication Sig Dispense Refill   albuterol (PROVENTIL) (2.5 MG/3ML) 0.083% nebulizer solution Take 2.5 mg by nebulization every 6 (six) hours as needed for wheezing or shortness of breath.     atorvastatin (LIPITOR) 10 MG tablet Take 10 mg by mouth daily.     Budeson-Glycopyrrol-Formoterol (BREZTRI AEROSPHERE) 160-9-4.8 MCG/ACT AERO Inhale 2 puffs into the lungs in the morning and at bedtime. 10.7 g 6   Budeson-Glycopyrrol-Formoterol (BREZTRI AEROSPHERE) 160-9-4.8 MCG/ACT AERO Inhale 2 puffs into the lungs in the morning and at bedtime. 23.6 g 0   budesonide (PULMICORT) 0.5 MG/2ML nebulizer solution Take 0.5 mg by nebulization 2 (two) times daily.     cyanocobalamin (,VITAMIN B-12,) 1000 MCG/ML injection Inject 1,000 mcg into the muscle every 30 (thirty) days.     folic acid (FOLVITE) 1 MG tablet Take 1 mg by mouth daily.     ipratropium-albuterol (DUONEB) 0.5-2.5 (3) MG/3ML SOLN Take 3 mLs by nebulization every 6 (six) hours as needed (Asthma).     lisinopril-hydrochlorothiazide (PRINZIDE,ZESTORETIC) 20-12.5 MG per tablet Take  1 tablet by mouth daily.     methocarbamol (ROBAXIN) 500 MG tablet Take 1 tablet (500 mg total) by mouth every 6 (six) hours as needed for muscle spasms. 40 tablet 0   methotrexate (RHEUMATREX) 2.5 MG tablet Take 10 mg by mouth once a week.     OXYGEN Inhale 2 L into the lungs continuous.     Skin Protectants, Misc. (EUCERIN) cream Apply 1 application topically as needed for dry skin.     Vitamin D, Ergocalciferol, (DRISDOL) 1.25 MG (50000 UNIT) CAPS capsule Take 50,000 Units by mouth every Monday.     No facility-administered medications prior to visit.    Review of Systems  Constitutional:  Negative for chills, fever, malaise/fatigue and weight loss.  HENT:  Negative for congestion, sinus pain and sore throat.   Eyes: Negative.    Respiratory:  Positive for cough, sputum production, shortness of breath and wheezing. Negative for hemoptysis.   Cardiovascular:  Negative for chest pain, palpitations, orthopnea, claudication and leg swelling.  Gastrointestinal:  Negative for abdominal pain, heartburn, nausea and vomiting.  Genitourinary: Negative.   Musculoskeletal:  Negative for joint pain and myalgias.  Skin:  Negative for rash.  Neurological:  Negative for weakness.  Endo/Heme/Allergies: Negative.   Psychiatric/Behavioral: Negative.     Objective:   There were no vitals filed for this visit.  Physical Exam Constitutional:      General: He is not in acute distress. HENT:     Head: Normocephalic and atraumatic.  Eyes:     Extraocular Movements: Extraocular movements intact.     Conjunctiva/sclera: Conjunctivae normal.     Pupils: Pupils are equal, round, and reactive to light.  Cardiovascular:     Rate and Rhythm: Normal rate and regular rhythm.     Pulses: Normal pulses.     Heart sounds: Normal heart sounds. No murmur heard. Pulmonary:     Effort: Pulmonary effort is normal.     Breath sounds: Decreased breath sounds and wheezing (diffuse) present. No rhonchi or rales.  Abdominal:     General: Bowel sounds are normal.     Palpations: Abdomen is soft.  Musculoskeletal:     Right lower leg: No edema.     Left lower leg: No edema.  Lymphadenopathy:     Cervical: No cervical adenopathy.  Skin:    General: Skin is warm and dry.  Neurological:     General: No focal deficit present.     Mental Status: He is alert.  Psychiatric:        Mood and Affect: Mood normal.        Behavior: Behavior normal.        Thought Content: Thought content normal.        Judgment: Judgment normal.    CBC    Component Value Date/Time   WBC 31.3 (H) 08/19/2020 0453   RBC 3.97 (L) 08/19/2020 0453   HGB 12.3 (L) 08/19/2020 0453   HGB 16.3 03/11/2013 0847   HCT 38.0 (L) 08/19/2020 0453   HCT 48.3 03/11/2013 0847    PLT 287 08/19/2020 0453   PLT 372 Large & giant platelets 03/11/2013 0847   MCV 95.7 08/19/2020 0453   MCV 90.2 03/11/2013 0847   MCH 31.0 08/19/2020 0453   MCHC 32.4 08/19/2020 0453   RDW 14.9 08/19/2020 0453   RDW 14.7 (H) 03/11/2013 0847   LYMPHSABS 3.6 (H) 03/11/2013 0847   MONOABS 2.1 (H) 03/11/2013 0847   EOSABS 0.9 (H) 03/11/2013 0160  BASOSABS 0.2 (H) 03/11/2013 0847   BMP Latest Ref Rng & Units 08/18/2020 08/17/2020 08/10/2020  Glucose 70 - 99 mg/dL 902(I) 097(D) 532(D)  BUN 8 - 23 mg/dL 11 9 13   Creatinine 0.61 - 1.24 mg/dL 9.24 2.68  Sodium 135 - 145 mmol/L 133(L) 136 134(L)  Potassium 3.5 - 5.1 mmol/L 3.9 3.3(L) 3.6  Chloride 98 - 111 mmol/L 101 99 97(L)  CO2 22 - 32 mmol/L 26 28 29   Calcium 8.9 - 10.3 mg/dL 3.41) 8.3(L) 9.3   Chest imaging: CXR 2005 No acute cardiopulmonary findings. Lungs clear bilaterally.   PFT: No flowsheet data found.  Echo 08/18/20: LVEF 60-65%. Grade I diastolic dysfunction. RV systolic function is normal. RV is mildly enlarged.   Assessment & Plan:   No diagnosis found.  Discussion: Bienvenido Proehl is a 65 year old male, daily smoker with hypertension and rheumatoid arthritis who is referred to pulmonary clinic for evaluation of COPD.   He does have diminished breath sounds and wheezing on exam concerning for obstructive lung disease based on his significant smoking history of 20+ pack years.   We will treat him for COPD exacerbation today with steroid taper and azithromycin.   He is to start breztri inhaler therapy 2 puffs twice daily along with as needed albuterol.   We will refer him to our lung cancer screening program. We discussed smoking cessation options and provided instructions on using nicotine lozenges based on his interest.   We will discuss with him about obtaining an overnight oximetry test at the next visit as there is some enlargement of his RV. We can also consider a formal sleep study to evaluate for sleep  disordered breathing. We will also check pulmonary function tests at the next follow up visit.   Follow up in 2 months.  Gerarda Fraction, MD [facility] Office: [phone]   Current Outpatient Medications:    albuterol (PROVENTIL) (2.5 MG/3ML) 0.083% nebulizer solution, Take 2.5 mg by nebulization every 6 (six) hours as needed for wheezing or shortness of breath., Disp: , Rfl:    atorvastatin (LIPITOR) 10 MG tablet, Take 10 mg by mouth daily., Disp: , Rfl:    Budeson-Glycopyrrol-Formoterol (BREZTRI AEROSPHERE) 160-9-4.8 MCG/ACT AERO, Inhale 2 puffs into the lungs in the morning and at bedtime., Disp: 10.7 g, Rfl: 6   Budeson-Glycopyrrol-Formoterol (BREZTRI AEROSPHERE) 160-9-4.8 MCG/ACT AERO, Inhale 2 puffs into the lungs in the morning and at bedtime., Disp: 23.6 g, Rfl: 0   budesonide (PULMICORT) 0.5 MG/2ML nebulizer solution, Take 0.5 mg by nebulization 2 (two) times daily., Disp: , Rfl:    cyanocobalamin (,VITAMIN B-12,) 1000 MCG/ML injection, Inject 1,000 mcg into the muscle every 30 (thirty) days., Disp: , Rfl:    folic acid (FOLVITE) 1 MG tablet, Take 1 mg by mouth daily., Disp: , Rfl:    ipratropium-albuterol (DUONEB) 0.5-2.5 (3) MG/3ML SOLN, Take 3 mLs by nebulization every 6 (six) hours as needed (Asthma)., Disp: , Rfl:    lisinopril-hydrochlorothiazide (PRINZIDE,ZESTORETIC) 20-12.5 MG per tablet, Take 1 tablet by mouth daily., Disp: , Rfl:    methocarbamol (ROBAXIN) 500 MG tablet, Take 1 tablet (500 mg total) by mouth every 6 (six) hours as needed for muscle spasms., Disp: 40 tablet, Rfl: 0   methotrexate (RHEUMATREX) 2.5 MG tablet, Take 10 mg by mouth once a week., Disp: , Rfl:    OXYGEN, Inhale 2 L into the lungs continuous., Disp: , Rfl:    Skin Protectants, Misc. (EUCERIN) cream, Apply 1 application  topically as needed for dry skin., Disp: , Rfl:    Vitamin D, Ergocalciferol, (DRISDOL) 1.25 MG (50000 UNIT) CAPS capsule, Take 50,000 Units by mouth every  Monday., Disp: , Rfl:

## 2020-12-07 DIAGNOSIS — J9601 Acute respiratory failure with hypoxia: Secondary | ICD-10-CM | POA: Diagnosis not present

## 2020-12-07 DIAGNOSIS — J441 Chronic obstructive pulmonary disease with (acute) exacerbation: Secondary | ICD-10-CM | POA: Diagnosis not present

## 2020-12-08 DIAGNOSIS — J449 Chronic obstructive pulmonary disease, unspecified: Secondary | ICD-10-CM | POA: Diagnosis not present

## 2020-12-30 DIAGNOSIS — J449 Chronic obstructive pulmonary disease, unspecified: Secondary | ICD-10-CM | POA: Diagnosis not present

## 2020-12-30 DIAGNOSIS — M0579 Rheumatoid arthritis with rheumatoid factor of multiple sites without organ or systems involvement: Secondary | ICD-10-CM | POA: Diagnosis not present

## 2021-01-07 DIAGNOSIS — J441 Chronic obstructive pulmonary disease with (acute) exacerbation: Secondary | ICD-10-CM | POA: Diagnosis not present

## 2021-01-07 DIAGNOSIS — J9601 Acute respiratory failure with hypoxia: Secondary | ICD-10-CM | POA: Diagnosis not present

## 2021-01-26 DIAGNOSIS — J449 Chronic obstructive pulmonary disease, unspecified: Secondary | ICD-10-CM | POA: Diagnosis not present

## 2021-02-06 DIAGNOSIS — J9601 Acute respiratory failure with hypoxia: Secondary | ICD-10-CM | POA: Diagnosis not present

## 2021-02-06 DIAGNOSIS — J441 Chronic obstructive pulmonary disease with (acute) exacerbation: Secondary | ICD-10-CM | POA: Diagnosis not present

## 2021-02-14 DIAGNOSIS — Z6839 Body mass index (BMI) 39.0-39.9, adult: Secondary | ICD-10-CM | POA: Diagnosis not present

## 2021-02-14 DIAGNOSIS — M545 Low back pain, unspecified: Secondary | ICD-10-CM | POA: Diagnosis not present

## 2021-02-22 DIAGNOSIS — K439 Ventral hernia without obstruction or gangrene: Secondary | ICD-10-CM | POA: Diagnosis not present

## 2021-02-22 DIAGNOSIS — S22080A Wedge compression fracture of T11-T12 vertebra, initial encounter for closed fracture: Secondary | ICD-10-CM | POA: Diagnosis not present

## 2021-02-22 DIAGNOSIS — M545 Low back pain, unspecified: Secondary | ICD-10-CM | POA: Diagnosis not present

## 2021-02-22 DIAGNOSIS — N3289 Other specified disorders of bladder: Secondary | ICD-10-CM | POA: Diagnosis not present

## 2021-02-22 DIAGNOSIS — R1032 Left lower quadrant pain: Secondary | ICD-10-CM | POA: Diagnosis not present

## 2021-02-22 DIAGNOSIS — X58XXXA Exposure to other specified factors, initial encounter: Secondary | ICD-10-CM | POA: Diagnosis not present

## 2021-02-22 DIAGNOSIS — M47816 Spondylosis without myelopathy or radiculopathy, lumbar region: Secondary | ICD-10-CM | POA: Diagnosis not present

## 2021-02-24 DIAGNOSIS — J449 Chronic obstructive pulmonary disease, unspecified: Secondary | ICD-10-CM | POA: Diagnosis not present

## 2021-02-24 DIAGNOSIS — E669 Obesity, unspecified: Secondary | ICD-10-CM | POA: Diagnosis not present

## 2021-02-24 DIAGNOSIS — S22080A Wedge compression fracture of T11-T12 vertebra, initial encounter for closed fracture: Secondary | ICD-10-CM | POA: Diagnosis not present

## 2021-02-24 DIAGNOSIS — E559 Vitamin D deficiency, unspecified: Secondary | ICD-10-CM | POA: Diagnosis not present

## 2021-02-24 DIAGNOSIS — E782 Mixed hyperlipidemia: Secondary | ICD-10-CM | POA: Diagnosis not present

## 2021-02-24 DIAGNOSIS — I10 Essential (primary) hypertension: Secondary | ICD-10-CM | POA: Diagnosis not present

## 2021-02-24 DIAGNOSIS — E119 Type 2 diabetes mellitus without complications: Secondary | ICD-10-CM | POA: Diagnosis not present

## 2021-03-01 DIAGNOSIS — J449 Chronic obstructive pulmonary disease, unspecified: Secondary | ICD-10-CM | POA: Diagnosis not present

## 2021-03-02 DIAGNOSIS — D72829 Elevated white blood cell count, unspecified: Secondary | ICD-10-CM | POA: Diagnosis not present

## 2021-03-09 DIAGNOSIS — M545 Low back pain, unspecified: Secondary | ICD-10-CM | POA: Diagnosis not present

## 2021-03-09 DIAGNOSIS — J441 Chronic obstructive pulmonary disease with (acute) exacerbation: Secondary | ICD-10-CM | POA: Diagnosis not present

## 2021-03-09 DIAGNOSIS — M47817 Spondylosis without myelopathy or radiculopathy, lumbosacral region: Secondary | ICD-10-CM | POA: Diagnosis not present

## 2021-03-09 DIAGNOSIS — J9601 Acute respiratory failure with hypoxia: Secondary | ICD-10-CM | POA: Diagnosis not present

## 2021-03-23 DIAGNOSIS — D72829 Elevated white blood cell count, unspecified: Secondary | ICD-10-CM | POA: Diagnosis not present

## 2021-03-28 DIAGNOSIS — J449 Chronic obstructive pulmonary disease, unspecified: Secondary | ICD-10-CM | POA: Diagnosis not present

## 2021-03-31 ENCOUNTER — Telehealth: Payer: Self-pay | Admitting: Nurse Practitioner

## 2021-03-31 ENCOUNTER — Other Ambulatory Visit: Payer: Self-pay | Admitting: *Deleted

## 2021-03-31 DIAGNOSIS — D72829 Elevated white blood cell count, unspecified: Secondary | ICD-10-CM

## 2021-03-31 NOTE — Telephone Encounter (Signed)
Contacted patient and reached Wife Olegario Messier) and we were able to schedule initial visit from referral for patient. They are aware of date and time. Letter will also be sent since it was requested.

## 2021-03-31 NOTE — Progress Notes (Signed)
New Patient appt offered for 05/04/2021 at 1115 with labs at 1100 with Lonna Cobb NP Lab orders entered

## 2021-04-04 DIAGNOSIS — E669 Obesity, unspecified: Secondary | ICD-10-CM | POA: Diagnosis not present

## 2021-04-04 DIAGNOSIS — M0579 Rheumatoid arthritis with rheumatoid factor of multiple sites without organ or systems involvement: Secondary | ICD-10-CM | POA: Diagnosis not present

## 2021-04-04 DIAGNOSIS — Z6838 Body mass index (BMI) 38.0-38.9, adult: Secondary | ICD-10-CM | POA: Diagnosis not present

## 2021-04-04 DIAGNOSIS — X58XXXA Exposure to other specified factors, initial encounter: Secondary | ICD-10-CM | POA: Diagnosis not present

## 2021-04-04 DIAGNOSIS — M546 Pain in thoracic spine: Secondary | ICD-10-CM | POA: Diagnosis not present

## 2021-04-04 DIAGNOSIS — M25561 Pain in right knee: Secondary | ICD-10-CM | POA: Diagnosis not present

## 2021-04-04 DIAGNOSIS — M545 Low back pain, unspecified: Secondary | ICD-10-CM | POA: Diagnosis not present

## 2021-04-04 DIAGNOSIS — S32010A Wedge compression fracture of first lumbar vertebra, initial encounter for closed fracture: Secondary | ICD-10-CM | POA: Diagnosis not present

## 2021-04-04 DIAGNOSIS — M150 Primary generalized (osteo)arthritis: Secondary | ICD-10-CM | POA: Diagnosis not present

## 2021-04-04 DIAGNOSIS — M5416 Radiculopathy, lumbar region: Secondary | ICD-10-CM | POA: Diagnosis not present

## 2021-04-04 DIAGNOSIS — S22010A Wedge compression fracture of first thoracic vertebra, initial encounter for closed fracture: Secondary | ICD-10-CM | POA: Diagnosis not present

## 2021-04-08 DIAGNOSIS — M5451 Vertebrogenic low back pain: Secondary | ICD-10-CM | POA: Diagnosis not present

## 2021-04-08 DIAGNOSIS — M2550 Pain in unspecified joint: Secondary | ICD-10-CM | POA: Diagnosis not present

## 2021-04-08 DIAGNOSIS — M546 Pain in thoracic spine: Secondary | ICD-10-CM | POA: Diagnosis not present

## 2021-04-09 DIAGNOSIS — J9601 Acute respiratory failure with hypoxia: Secondary | ICD-10-CM | POA: Diagnosis not present

## 2021-04-09 DIAGNOSIS — J441 Chronic obstructive pulmonary disease with (acute) exacerbation: Secondary | ICD-10-CM | POA: Diagnosis not present

## 2021-04-13 ENCOUNTER — Other Ambulatory Visit: Payer: Self-pay

## 2021-04-13 ENCOUNTER — Encounter: Payer: Self-pay | Admitting: Acute Care

## 2021-04-13 ENCOUNTER — Ambulatory Visit (INDEPENDENT_AMBULATORY_CARE_PROVIDER_SITE_OTHER): Payer: Medicare HMO | Admitting: Acute Care

## 2021-04-13 DIAGNOSIS — F17210 Nicotine dependence, cigarettes, uncomplicated: Secondary | ICD-10-CM | POA: Diagnosis not present

## 2021-04-13 DIAGNOSIS — R69 Illness, unspecified: Secondary | ICD-10-CM | POA: Diagnosis not present

## 2021-04-13 NOTE — Patient Instructions (Signed)
Thank you for participating in the [facility] Screening Program. °It was our pleasure to meet you today. °We will call you with the results of your scan within the next few days. °Your scan will be assigned a Lung RADS category score by the physicians reading the scans.  °This Lung RADS score determines follow up scanning.  °See below for description of categories, and follow up screening recommendations. °We will be in touch to schedule your follow up screening annually or based on recommendations of our providers. °We will fax a copy of your scan results to your Primary Care Physician, or the physician who referred you to the program, to ensure they have the results. °Please call the office if you have any questions or concerns regarding your scanning experience or results.  °Our office number is [phone]. °Please speak with Denise Phelps, RN. She is our Lung Cancer Screening RN. °If she is unavailable when you call, please have the office staff send her a message. She will return your call at her earliest convenience. °Remember, if your scan is normal, we will scan you annually as long as you continue to meet the criteria for the program. (Age 55-77, Current smoker or smoker who has quit within the last 15 years). °If you are a smoker, remember, quitting is the single most powerful action that you can take to decrease your risk of lung cancer and other pulmonary, breathing related problems. °We know quitting is hard, and we are here to help.  °Please let us know if there is anything we can do to help you meet your goal of quitting. °If you are a former smoker, congratulations. We are proud of you! Remain smoke free! °Remember you can refer friends or family members through the number above.  °We will screen them to make sure they meet criteria for the program. °Thank you for helping us take better care of you by participating in Lung Screening. ° °You can receive free nicotine replacement therapy  ( patches, gum or mints) by calling 1-800-QUIT NOW. Please call so we can get you on the path to becoming  a non-smoker. I know it is hard, but you can do this! ° °Lung RADS Categories: ° °Lung RADS 1: no nodules or definitely non-concerning nodules.  °Recommendation is for a repeat annual scan in 12 months. ° °Lung RADS 2:  nodules that are non-concerning in appearance and behavior with a very low likelihood of becoming an active cancer. °Recommendation is for a repeat annual scan in 12 months. ° °Lung RADS 3: nodules that are probably non-concerning , includes nodules with a low likelihood of becoming an active cancer.  Recommendation is for a 6-month repeat screening scan. Often noted after an upper respiratory illness. We will be in touch to make sure you have no questions, and to schedule your 6-month scan. ° °Lung RADS 4 A: nodules with concerning findings, recommendation is most often for a follow up scan in 3 months or additional testing based on our provider's assessment of the scan. We will be in touch to make sure you have no questions and to schedule the recommended 3 month follow up scan. ° °Lung RADS 4 B:  indicates findings that are concerning. We will be in touch with you to schedule additional diagnostic testing based on our provider's  assessment of the scan. ° °Hypnosis for smoking cessation  °[employer] °[phone] ° °Acupuncture for smoking cessation  °[facility] °[phone]  °

## 2021-04-13 NOTE — Progress Notes (Addendum)
Virtual Visit via Telephone Note  I connected with Nathan Nicholson on 04/18/21 at  9:00 AM EST by telephone and verified that I am speaking with the correct person using two identifiers.  Location: Patient:  At home Provider:  [address], [place], [place], [address]    I discussed the limitations, risks, security and privacy concerns of performing an evaluation and management service by telephone and the availability of in person appointments. I also discussed with the patient that there may be a patient responsible charge related to this service. The patient expressed understanding and agreed to proceed.   Shared Decision Making Visit Lung Cancer Screening Program [phone])   Eligibility: Age 65 y.o. Pack Years Smoking History Calculation 25 pack year smoking history (# packs/per year x # years smoked) Recent History of coughing up blood  no Unexplained weight loss? no ( >Than 15 pounds within the last 6 months ) Prior History Lung / other cancer no (Diagnosis within the last 5 years already requiring surveillance chest CT Scans). Smoking Status Current Smoker Former Smokers: Years since quit:  NA  Quit Date:  NA  Visit Components: Discussion included one or more decision making aids. yes Discussion included risk/benefits of screening. yes Discussion included potential follow up diagnostic testing for abnormal scans. yes Discussion included meaning and risk of over diagnosis. yes Discussion included meaning and risk of False Positives. yes Discussion included meaning of total radiation exposure. yes  Counseling Included: Importance of adherence to annual lung cancer LDCT screening. yes Impact of comorbidities on ability to participate in the program. yes Ability and willingness to under diagnostic treatment. yes  Smoking Cessation Counseling: Current Smokers:  Discussed importance of smoking cessation. yes Information about tobacco cessation classes and  interventions provided to patient. yes Patient provided with "ticket" for LDCT Scan. yes Symptomatic Patient. no  Counseling NA Diagnosis Code: Tobacco Use Z72.0 Asymptomatic Patient yes  Counseling (Intermediate counseling: > three minutes counseling) [id number] Former Smokers:  Discussed the importance of maintaining cigarette abstinence. yes Diagnosis Code: Personal History of Nicotine Dependence. W29.562 Information about tobacco cessation classes and interventions provided to patient. Yes Patient provided with "ticket" for LDCT Scan. yes Written Order for Lung Cancer Screening with LDCT placed in Epic. Yes (CT Chest Lung Cancer Screening Low Dose W/O CM) [id number] Z12.2-Screening of respiratory organs Z87.891-Personal history of nicotine dependence  I have spent 25 minutes of face to face/ virtual visit   time with  Nathan Nicholson discussing the risks and benefits of lung cancer screening. We viewed / discussed a power point together that explained in detail the above noted topics. We paused at intervals to allow for questions to be asked and answered to ensure understanding.We discussed that the single most powerful action that he can take to decrease his risk of developing lung cancer is to quit smoking. We discussed whether or not he is ready to commit to setting a quit date. We discussed options for tools to aid in quitting smoking including nicotine replacement therapy, non-nicotine medications, support groups, Quit Smart classes, and behavior modification. We discussed that often times setting smaller, more achievable goals, such as eliminating 1 cigarette a day for a week and then 2 cigarettes a day for a week can be helpful in slowly decreasing the number of cigarettes smoked. This allows for a sense of accomplishment as well as providing a clinical benefit. I provided  him  with smoking cessation  information  with contact information for community resources,  classes, free nicotine replacement  therapy, and access to mobile apps, text messaging, and on-line smoking cessation help. I have also provided  him  the office contact information in the event he needs to contact me, or the screening staff. We discussed the time and location of the scan, and that either Abigail Miyamoto RN, Karlton Lemon, RN  or I will call / send a letter with the results within 24-72 hours of receiving them. The patient verbalized understanding of all of  the above and had no further questions upon leaving the office. They have my contact information in the event they have any further questions.  I spent 3 minutes counseling on smoking cessation and the health risks of continued tobacco abuse.  I explained to the patient that there has been a high incidence of coronary artery disease noted on these exams. I explained that this is a non-gated exam therefore degree or severity cannot be determined. This patient is on statin therapy. I have asked the patient to follow-up with their PCP regarding any incidental finding of coronary artery disease and management with diet or medication as their PCP  feels is clinically indicated. The patient verbalized understanding of the above and had no further questions upon completion of the visit.      Bevelyn Ngo, NP 04/13/2021

## 2021-04-18 DIAGNOSIS — Z6835 Body mass index (BMI) 35.0-35.9, adult: Secondary | ICD-10-CM | POA: Diagnosis not present

## 2021-04-18 DIAGNOSIS — M2550 Pain in unspecified joint: Secondary | ICD-10-CM | POA: Diagnosis not present

## 2021-04-18 DIAGNOSIS — Z1331 Encounter for screening for depression: Secondary | ICD-10-CM | POA: Diagnosis not present

## 2021-04-18 DIAGNOSIS — D72829 Elevated white blood cell count, unspecified: Secondary | ICD-10-CM | POA: Diagnosis not present

## 2021-04-18 DIAGNOSIS — Z9181 History of falling: Secondary | ICD-10-CM | POA: Diagnosis not present

## 2021-04-18 DIAGNOSIS — Z79899 Other long term (current) drug therapy: Secondary | ICD-10-CM | POA: Diagnosis not present

## 2021-04-19 DIAGNOSIS — M4854XA Collapsed vertebra, not elsewhere classified, thoracic region, initial encounter for fracture: Secondary | ICD-10-CM | POA: Diagnosis not present

## 2021-04-19 DIAGNOSIS — M549 Dorsalgia, unspecified: Secondary | ICD-10-CM | POA: Diagnosis not present

## 2021-04-19 DIAGNOSIS — R918 Other nonspecific abnormal finding of lung field: Secondary | ICD-10-CM | POA: Diagnosis not present

## 2021-04-20 DIAGNOSIS — I10 Essential (primary) hypertension: Secondary | ICD-10-CM | POA: Diagnosis not present

## 2021-04-20 DIAGNOSIS — K219 Gastro-esophageal reflux disease without esophagitis: Secondary | ICD-10-CM | POA: Diagnosis not present

## 2021-04-20 DIAGNOSIS — I700 Atherosclerosis of aorta: Secondary | ICD-10-CM | POA: Diagnosis not present

## 2021-04-20 DIAGNOSIS — E860 Dehydration: Secondary | ICD-10-CM | POA: Diagnosis not present

## 2021-04-20 DIAGNOSIS — J449 Chronic obstructive pulmonary disease, unspecified: Secondary | ICD-10-CM | POA: Diagnosis not present

## 2021-04-20 DIAGNOSIS — T40605A Adverse effect of unspecified narcotics, initial encounter: Secondary | ICD-10-CM | POA: Diagnosis not present

## 2021-04-20 DIAGNOSIS — N3000 Acute cystitis without hematuria: Secondary | ICD-10-CM | POA: Diagnosis not present

## 2021-04-20 DIAGNOSIS — N179 Acute kidney failure, unspecified: Secondary | ICD-10-CM | POA: Diagnosis not present

## 2021-04-20 DIAGNOSIS — K5903 Drug induced constipation: Secondary | ICD-10-CM | POA: Diagnosis not present

## 2021-04-20 DIAGNOSIS — K5900 Constipation, unspecified: Secondary | ICD-10-CM | POA: Diagnosis not present

## 2021-04-20 DIAGNOSIS — M069 Rheumatoid arthritis, unspecified: Secondary | ICD-10-CM | POA: Diagnosis not present

## 2021-04-20 DIAGNOSIS — E7800 Pure hypercholesterolemia, unspecified: Secondary | ICD-10-CM | POA: Diagnosis not present

## 2021-04-21 DIAGNOSIS — E860 Dehydration: Secondary | ICD-10-CM | POA: Diagnosis not present

## 2021-04-21 DIAGNOSIS — N3000 Acute cystitis without hematuria: Secondary | ICD-10-CM | POA: Diagnosis not present

## 2021-04-21 DIAGNOSIS — N179 Acute kidney failure, unspecified: Secondary | ICD-10-CM | POA: Diagnosis not present

## 2021-04-22 DIAGNOSIS — N3000 Acute cystitis without hematuria: Secondary | ICD-10-CM | POA: Diagnosis not present

## 2021-04-22 DIAGNOSIS — E860 Dehydration: Secondary | ICD-10-CM | POA: Diagnosis not present

## 2021-04-22 DIAGNOSIS — N179 Acute kidney failure, unspecified: Secondary | ICD-10-CM | POA: Diagnosis not present

## 2021-04-25 DIAGNOSIS — S22080D Wedge compression fracture of T11-T12 vertebra, subsequent encounter for fracture with routine healing: Secondary | ICD-10-CM | POA: Diagnosis not present

## 2021-04-27 DIAGNOSIS — J449 Chronic obstructive pulmonary disease, unspecified: Secondary | ICD-10-CM | POA: Diagnosis not present

## 2021-05-03 DIAGNOSIS — I10 Essential (primary) hypertension: Secondary | ICD-10-CM | POA: Diagnosis not present

## 2021-05-03 DIAGNOSIS — J449 Chronic obstructive pulmonary disease, unspecified: Secondary | ICD-10-CM | POA: Diagnosis not present

## 2021-05-03 DIAGNOSIS — E119 Type 2 diabetes mellitus without complications: Secondary | ICD-10-CM | POA: Diagnosis not present

## 2021-05-03 NOTE — Progress Notes (Addendum)
New Hematology/Oncology Consult   Requesting MD: Orlinda Blalock NP  [phone]      Reason for Consult: Leukocytosis  HPI: Mr. Hrabik is a 66 year old man referred for evaluation of leukocytosis.  He is actually a former patient of the [facility] last seen by Dr. Benay Spice 03/11/2013.  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  He has rheumatoid arthritis.  CBC from 03/24/2021 showed a hemoglobin of 14, white count 17.2, (53% neutrophils, 31% lymphocytes, 12% monocytes), platelet count 343,000.  Most recent CBC from the EMR 08/19/2020 at which time he was hospitalized for a right total hip replacement-hemoglobin 12.3, white count 31, platelet count 287,000.  At the time of his last office visit with Dr. Benay Spice on 03/11/2013 CBC as follows-hemoglobin 16.3, white count 12.0, platelet count 372,000.  Mr. Scroggin reports he is here for evaluation of severe back pain.  He reports low back pain for the past month.  He is seeing Dr. Gladstone Lighter.  He brought in envelope from Dr. Gladstone Lighter which indicates he was referred for an MRI of the thoracic spine.  Mr. Nikirk is unsure when or where the MRI was done.  He is unsure of the results.  He has lab results from 04/18/2021 which show a creatinine of 3.1, normal calcium, hemoglobin 14.6, white count 14, platelet count 423,000, uric acid 14.2 (3.8-8.4), rheumatoid factor 570 (less than 14), sed rate 105 (0-30), serum protein electrophoresis with no M spike observed, IFE shows polyclonal increase detected in 1 or more immunoglobulins, IgG 1947 [id number]), IgA 415 (61-437), IgM 120 (20-172).  Mr. Gossage reports he has intentionally lost 50 pounds over the past 4 weeks.  He describes his appetite as "fair".  He has stable dyspnea on exertion.  He utilizes supplemental oxygen at home.  He continues to smoke estimating  1 to 2 packs/week.  He has an occasional cough.  No change in bowel habits.  No bowel or bladder dysfunction.  No leg weakness or numbness.  No dysphagia.  No urinary symptoms.  He denies any recent infections.  No fevers or sweats.  He denies bleeding.  No black stools.   Past Medical History:  Diagnosis Date   COPD (chronic obstructive pulmonary disease) ([facility])    COVID-19    03-2020   Dyspnea    with exertion   Hypertension    Pneumonia    Rheumatoid arthritis ([place])      Past Surgical History:  Procedure Laterality Date   FOOT SURGERY     left   HERNIA REPAIR     SPLENECTOMY     TOOTH EXTRACTION     TOTAL HIP ARTHROPLASTY Right 08/17/2020   Procedure: TOTAL HIP ARTHROPLASTY ANTERIOR APPROACH;  Surgeon: Paralee Cancel, MD;  Location: [facility];  Service: Orthopedics;  Laterality: Right;  70 min     Current Outpatient Medications:    albuterol (PROVENTIL) (2.5 MG/3ML) 0.083% nebulizer solution, Take 2.5 mg by nebulization every 6 (six) hours as needed for wheezing or shortness of breath., Disp: , Rfl:    aspirin 81 MG EC tablet, 1 tablet, Disp: , Rfl:    atorvastatin (LIPITOR) 10 MG tablet, Take 10 mg by mouth daily., Disp: , Rfl:    budesonide (PULMICORT) 0.5 MG/2ML nebulizer solution, Take 0.5 mg by nebulization 2 (two) times daily., Disp: , Rfl:    cyanocobalamin (,VITAMIN B-12,)  1000 MCG/ML injection, Inject 1,000 mcg into the muscle every 30 (thirty) days., Disp: , Rfl:    folic acid (FOLVITE) 1 MG tablet, Take 1 mg by mouth daily., Disp: , Rfl:    ipratropium-albuterol (DUONEB) 0.5-2.5 (3) MG/3ML SOLN, Take 3 mLs by nebulization every 6 (six) hours as needed (Asthma)., Disp: , Rfl:    lisinopril-hydrochlorothiazide (PRINZIDE,ZESTORETIC) 20-12.5 MG per tablet, Take 1 tablet by mouth daily., Disp: , Rfl:    metFORMIN (GLUCOPHAGE-XR) 500 MG 24 hr tablet, metformin ER 500 mg tablet,extended release 24 hr  TAKE 1 TABLET BY MOUTH EVERY DAY IN THE MORNING, Disp: , Rfl:     methocarbamol (ROBAXIN) 500 MG tablet, Take 1 tablet (500 mg total) by mouth every 6 (six) hours as needed for muscle spasms., Disp: 40 tablet, Rfl: 0   methotrexate (RHEUMATREX) 2.5 MG tablet, Take 10 mg by mouth once a week., Disp: , Rfl:    OXYGEN, Inhale 2 L into the lungs continuous., Disp: , Rfl:    Skin Protectants, Misc. (EUCERIN) cream, Apply 1 application topically as needed for dry skin., Disp: , Rfl:    Vitamin D, Ergocalciferol, (DRISDOL) 1.25 MG (50000 UNIT) CAPS capsule, Take 50,000 Units by mouth every Monday., Disp: , Rfl:    Budeson-Glycopyrrol-Formoterol (BREZTRI AEROSPHERE) 160-9-4.8 MCG/ACT AERO, Inhale 2 puffs into the lungs in the morning and at bedtime. (Patient not taking: Reported on 05/04/2021), Disp: 10.7 g, Rfl: 6   Budeson-Glycopyrrol-Formoterol (BREZTRI AEROSPHERE) 160-9-4.8 MCG/ACT AERO, Inhale 2 puffs into the lungs in the morning and at bedtime. (Patient not taking: Reported on 05/04/2021), Disp: 23.6 g, Rfl: 0:  :  No Known Allergies:  FH: He is not aware of any malignancy in his family.  SOCIAL HISTORY: He lives in [place] with his wife.  He has 2 daughters reported to be in good health.  He is disabled.  He smokes 1 to 2 packs of cigarettes per week.  He has been smoking for 50+ years.  He denies EtOH intake.  Review of Systems: Mr. Bihl reports he has intentionally lost 50 pounds over the past 4 weeks.  He describes his appetite as "fair".  He reports the back pain has been present for about a month.  He notes that it radiates to his "sides".  He has stable dyspnea on exertion.  He utilizes supplemental oxygen at home.  He continues to smoke estimating 1 to 2 packs/week.  He has an occasional cough.  No change in bowel habits.  No bowel or bladder dysfunction.  No leg weakness or numbness.  No dysphagia.  No urinary symptoms.  He denies any recent infections.  No fevers or sweats.  He denies bleeding.  No black stools.  Physical Exam:  Blood pressure 111/85,  pulse 100, temperature 98.3 F (36.8 C), temperature source Oral, resp. rate 20, height 5\' 2"  (1.575 m), weight 191 lb 3.2 oz (86.7 kg), SpO2 95 %. He was unable to maneuver onto the exam table due to pain.  He was examined in the wheelchair. HEENT: Mouth appears dry. Lungs: Distant breath sounds, scattered wheezes.  No respiratory distress. Cardiac: Regular. Abdomen: Soft and nontender.  No obvious hepatosplenomegaly. Vascular: No leg edema. Lymph nodes: No palpable cervical, supraclavicular or axillary lymph nodes. Neurologic: Alert and oriented. Skin: White rash on both feet.  LABS:   Recent Labs    05/04/21 1100  WBC 15.8*  HGB 15.3  HCT 47.4  PLT 428*     Recent Labs  05/04/21 1100  NA 137  K 3.7  CL 95*  CO2 32  GLUCOSE 121*  BUN 10  CREATININE 0.94  CALCIUM 10.4*  Peripheral blood smear-majority of the red blood cells appear normal, it is difficult to assess red cell morphology secondary to the thick smear, few targets, rare nucleated red blood cell; white blood cell morphology unremarkable, few plasmacytoid lymphs; platelets appear normal in number    RADIOLOGY:  No results found.  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 Kloepfer was referred for evaluation of chronic leukocytosis.  He is a former patient of Dr. Benay Spice last seen in 2015.  He has a history of leukocytosis felt to most likely be related to previous splenectomy.  He reports severe low back pain over the past month.  The pain is worsening.  He had a thoracic spine MRI 04/19/2021 showing acute compression fractures at T11/T12.  The T11 compression fracture had progressed since  02/22/2021 and the T12 compression fracture was new.    Dr. Benay Spice has spoken with the hospitalist.  Mr. Delaney is being admitted for pain control/further evaluation.  Patient seen with Dr. Benay Spice.  Ned Card, NP  This was a shared visit with Ned Card.  Mr. Danehy was interviewed and examined.  We saw him many years ago for evaluation of Felty syndrome.  He has chronic leukocytosis, likely related to the postsplenectomy state and ongoing tobacco use.  He has developed severe back pain over the past month.  He has been evaluated by orthopedics.  He was found to have lower thoracic compression fractures on MRI earlier this month.  The pain has progressed.  He is in severe pain in the office today.  He was unable to ambulate.  The pain may be related to benign compression fractures, though the severity of the pain with progression over the past month would be unusual.  He has also lost a significant amount of weight over the past month.  There is no obvious malignancy on review of his history and examination today.  I recommend hospital admission for pain control and further diagnostic evaluation.  I will check on him in the hospital.  I was present for greater than 50% of today's visit.  I performed medical decision making.  Julieanne Manson, MD 05/04/2021, 1:09 PM

## 2021-05-04 ENCOUNTER — Encounter: Payer: Self-pay | Admitting: Nurse Practitioner

## 2021-05-04 ENCOUNTER — Other Ambulatory Visit: Payer: Self-pay

## 2021-05-04 ENCOUNTER — Observation Stay (HOSPITAL_COMMUNITY): Payer: Medicare HMO

## 2021-05-04 ENCOUNTER — Inpatient Hospital Stay: Payer: Medicare HMO

## 2021-05-04 ENCOUNTER — Telehealth: Payer: Self-pay

## 2021-05-04 ENCOUNTER — Inpatient Hospital Stay: Payer: Medicare HMO | Admitting: Nurse Practitioner

## 2021-05-04 ENCOUNTER — Inpatient Hospital Stay (HOSPITAL_COMMUNITY)
Admission: AD | Admit: 2021-05-04 | Discharge: 2021-05-13 | DRG: 543 | Disposition: A | Discharge status: Home / Self Care | Payer: Medicare HMO | Source: Ambulatory Visit | Attending: Internal Medicine | Admitting: Internal Medicine

## 2021-05-04 ENCOUNTER — Inpatient Hospital Stay: Payer: Medicare HMO | Attending: Oncology

## 2021-05-04 VITALS — BP 111/85 | HR 100 | Temp 98.3°F | Resp 20 | Ht 62.0 in | Wt 191.2 lb

## 2021-05-04 DIAGNOSIS — M4854XA Collapsed vertebra, not elsewhere classified, thoracic region, initial encounter for fracture: Principal | ICD-10-CM | POA: Diagnosis present

## 2021-05-04 DIAGNOSIS — E119 Type 2 diabetes mellitus without complications: Secondary | ICD-10-CM

## 2021-05-04 DIAGNOSIS — R634 Abnormal weight loss: Secondary | ICD-10-CM | POA: Insufficient documentation

## 2021-05-04 DIAGNOSIS — D72829 Elevated white blood cell count, unspecified: Secondary | ICD-10-CM

## 2021-05-04 DIAGNOSIS — M069 Rheumatoid arthritis, unspecified: Secondary | ICD-10-CM | POA: Insufficient documentation

## 2021-05-04 DIAGNOSIS — M545 Low back pain, unspecified: Secondary | ICD-10-CM | POA: Diagnosis not present

## 2021-05-04 DIAGNOSIS — N179 Acute kidney failure, unspecified: Secondary | ICD-10-CM

## 2021-05-04 DIAGNOSIS — Z8616 Personal history of COVID-19: Secondary | ICD-10-CM

## 2021-05-04 DIAGNOSIS — G8929 Other chronic pain: Secondary | ICD-10-CM

## 2021-05-04 DIAGNOSIS — Z9081 Acquired absence of spleen: Secondary | ICD-10-CM | POA: Insufficient documentation

## 2021-05-04 DIAGNOSIS — R630 Anorexia: Secondary | ICD-10-CM | POA: Diagnosis present

## 2021-05-04 DIAGNOSIS — I10 Essential (primary) hypertension: Secondary | ICD-10-CM | POA: Diagnosis not present

## 2021-05-04 DIAGNOSIS — E876 Hypokalemia: Secondary | ICD-10-CM

## 2021-05-04 DIAGNOSIS — Z20822 Contact with and (suspected) exposure to covid-19: Secondary | ICD-10-CM | POA: Diagnosis present

## 2021-05-04 DIAGNOSIS — R0609 Other forms of dyspnea: Secondary | ICD-10-CM | POA: Insufficient documentation

## 2021-05-04 DIAGNOSIS — E871 Hypo-osmolality and hyponatremia: Secondary | ICD-10-CM

## 2021-05-04 DIAGNOSIS — Z7984 Long term (current) use of oral hypoglycemic drugs: Secondary | ICD-10-CM

## 2021-05-04 DIAGNOSIS — E785 Hyperlipidemia, unspecified: Secondary | ICD-10-CM

## 2021-05-04 DIAGNOSIS — Z720 Tobacco use: Secondary | ICD-10-CM

## 2021-05-04 DIAGNOSIS — Z7982 Long term (current) use of aspirin: Secondary | ICD-10-CM

## 2021-05-04 DIAGNOSIS — F17210 Nicotine dependence, cigarettes, uncomplicated: Secondary | ICD-10-CM | POA: Diagnosis present

## 2021-05-04 DIAGNOSIS — Z79899 Other long term (current) drug therapy: Secondary | ICD-10-CM

## 2021-05-04 DIAGNOSIS — Z9981 Dependence on supplemental oxygen: Secondary | ICD-10-CM

## 2021-05-04 DIAGNOSIS — J449 Chronic obstructive pulmonary disease, unspecified: Secondary | ICD-10-CM | POA: Insufficient documentation

## 2021-05-04 DIAGNOSIS — Z7951 Long term (current) use of inhaled steroids: Secondary | ICD-10-CM

## 2021-05-04 DIAGNOSIS — Z6834 Body mass index (BMI) 34.0-34.9, adult: Secondary | ICD-10-CM

## 2021-05-04 DIAGNOSIS — D72820 Lymphocytosis (symptomatic): Secondary | ICD-10-CM | POA: Diagnosis present

## 2021-05-04 DIAGNOSIS — K5903 Drug induced constipation: Secondary | ICD-10-CM

## 2021-05-04 DIAGNOSIS — M8008XS Age-related osteoporosis with current pathological fracture, vertebra(e), sequela: Secondary | ICD-10-CM

## 2021-05-04 DIAGNOSIS — E669 Obesity, unspecified: Secondary | ICD-10-CM

## 2021-05-04 LAB — CBC WITH DIFFERENTIAL (CANCER CENTER ONLY)
Abs Immature Granulocytes: 0.06 10*3/uL (ref 0.00–0.07)
Basophils Absolute: 0.1 10*3/uL (ref 0.0–0.1)
Basophils Relative: 1 %
Eosinophils Absolute: 0.5 10*3/uL (ref 0.0–0.5)
Eosinophils Relative: 3 %
HCT: 47.4 % (ref 39.0–52.0)
Hemoglobin: 15.3 g/dL (ref 13.0–17.0)
Immature Granulocytes: 0 %
Lymphocytes Relative: 23 %
Lymphs Abs: 3.7 10*3/uL (ref 0.7–4.0)
MCH: 28.7 pg (ref 26.0–34.0)
MCHC: 32.3 g/dL (ref 30.0–36.0)
MCV: 88.8 fL (ref 80.0–100.0)
Monocytes Absolute: 1.2 10*3/uL — ABNORMAL HIGH (ref 0.1–1.0)
Monocytes Relative: 8 %
Neutro Abs: 10.3 10*3/uL — ABNORMAL HIGH (ref 1.7–7.7)
Neutrophils Relative %: 65 %
Platelet Count: 428 10*3/uL — ABNORMAL HIGH (ref 150–400)
RBC: 5.34 MIL/uL (ref 4.22–5.81)
RDW: 15.3 % (ref 11.5–15.5)
WBC Count: 15.8 10*3/uL — ABNORMAL HIGH (ref 4.0–10.5)
nRBC: 0 % (ref 0.0–0.2)

## 2021-05-04 LAB — HEMOGLOBIN A1C
Hgb A1c MFr Bld: 5.9 % — ABNORMAL HIGH (ref 4.8–5.6)
Mean Plasma Glucose: 122.63 mg/dL

## 2021-05-04 LAB — RESP PANEL BY RT-PCR (FLU A&B, COVID) ARPGX2
Influenza A by PCR: NEGATIVE
Influenza B by PCR: NEGATIVE
SARS Coronavirus 2 by RT PCR: NEGATIVE

## 2021-05-04 LAB — CMP (CANCER CENTER ONLY)
ALT: 28 U/L (ref 0–44)
AST: 24 U/L (ref 15–41)
Albumin: 3.8 g/dL (ref 3.5–5.0)
Alkaline Phosphatase: 142 U/L — ABNORMAL HIGH (ref 38–126)
Anion gap: 10 (ref 5–15)
BUN: 10 mg/dL (ref 8–23)
CO2: 32 mmol/L (ref 22–32)
Calcium: 10.4 mg/dL — ABNORMAL HIGH (ref 8.9–10.3)
Chloride: 95 mmol/L — ABNORMAL LOW (ref 98–111)
Creatinine: 0.94 mg/dL (ref 0.61–1.24)
GFR, Estimated: >60 mL/min (ref >60)
Glucose, Bld: 121 mg/dL — ABNORMAL HIGH (ref 70–99)
Potassium: 3.7 mmol/L (ref 3.5–5.1)
Sodium: 137 mmol/L (ref 135–145)
Total Bilirubin: 0.8 mg/dL (ref 0.3–1.2)
Total Protein: 8.3 g/dL — ABNORMAL HIGH (ref 6.5–8.1)

## 2021-05-04 LAB — TSH: TSH: 2.286 u[IU]/mL (ref 0.350–4.500)

## 2021-05-04 LAB — GLUCOSE, CAPILLARY: Glucose-Capillary: 95 mg/dL (ref 70–99)

## 2021-05-04 LAB — VITAMIN D 25 HYDROXY (VIT D DEFICIENCY, FRACTURES): Vit D, 25-Hydroxy: 95.77 ng/mL (ref 30–100)

## 2021-05-04 LAB — HIV ANTIBODY (ROUTINE TESTING W REFLEX): HIV Screen 4th Generation wRfx: NONREACTIVE

## 2021-05-04 LAB — SAVE SMEAR(SSMR), FOR PROVIDER SLIDE REVIEW

## 2021-05-04 IMAGING — MR MR LUMBAR SPINE WO/W CM
5 of 7 series · 30 of 48 positions shown · IV contrast (10 GADAVIST)
Comparison: None.

CLINICAL DATA: Compression fracture

EXAM:
MRI LUMBAR SPINE WITHOUT AND WITH CONTRAST
TECHNIQUE: Multiplanar and multiecho pulse sequences of the lumbar spine were
obtained without and with intravenous contrast.
CONTRAST:  10mL GADAVIST GADOBUTROL 1 MMOL/ML IV SOLN

[Series 6: T1 · sagittal · 4.0mm · 0.81mm/px · 3 of 17 slices shown (1 of 2)]
[im 1/17]
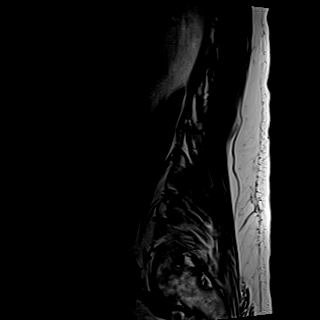
[im 9/17]
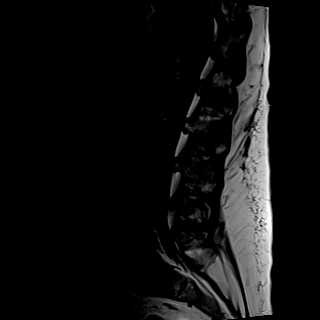
[im 17/17]
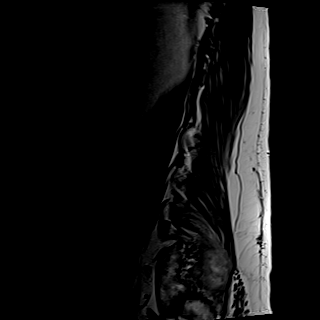

[Series 8: T2 · axial · 4.0mm · 0.62mm/px · z∈[-171,+94]mm · 11 of 51 slices shown]
[im 1/51]
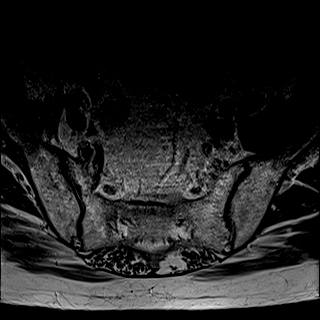
[im 6/51]
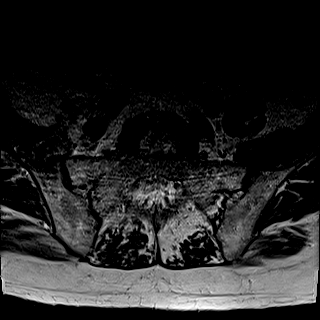
[im 11/51]
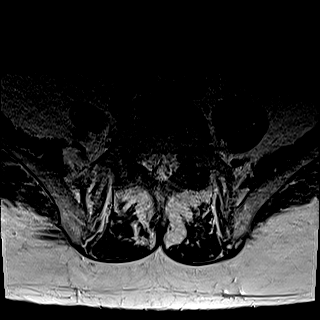
[im 16/51]
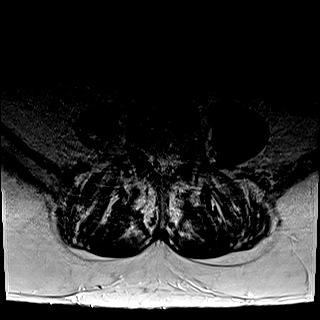
[im 21/51]
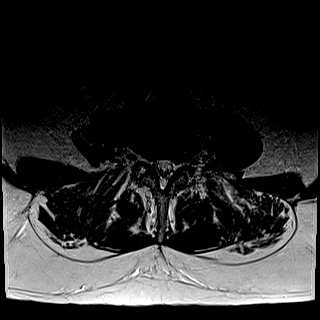
[im 26/51]
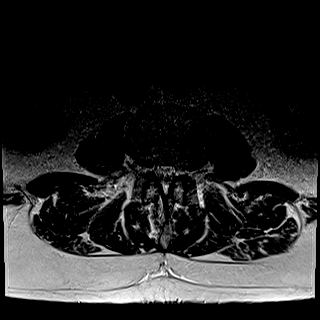
[im 31/51]
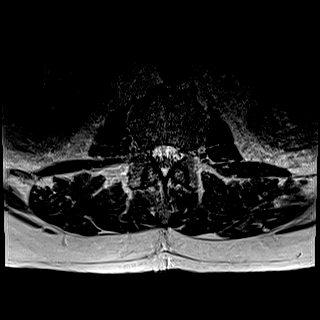
[im 36/51]
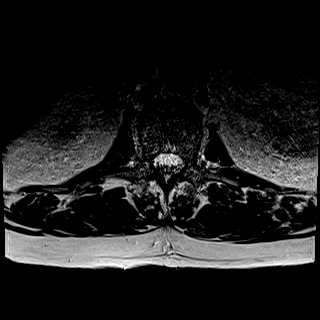
[im 41/51]
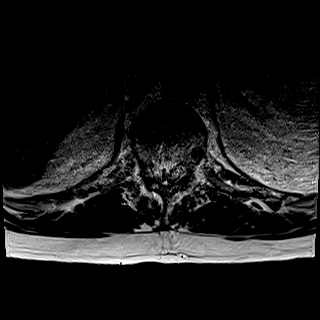
[im 46/51]
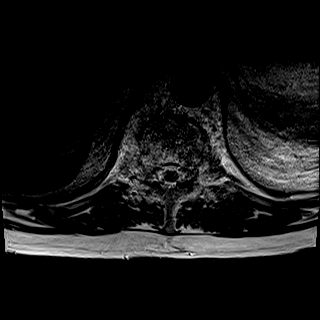
[im 51/51]
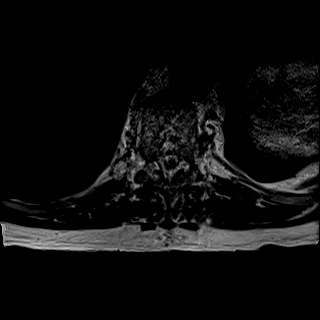

[Series 9: T1 · axial · 4.0mm · 0.39mm/px · z∈[-171,+94]mm · 11 of 51 slices shown (2 of 2)]
[im 1/51]
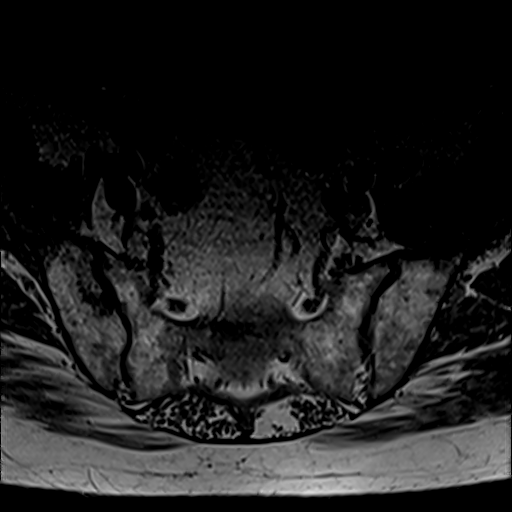
[im 6/51]
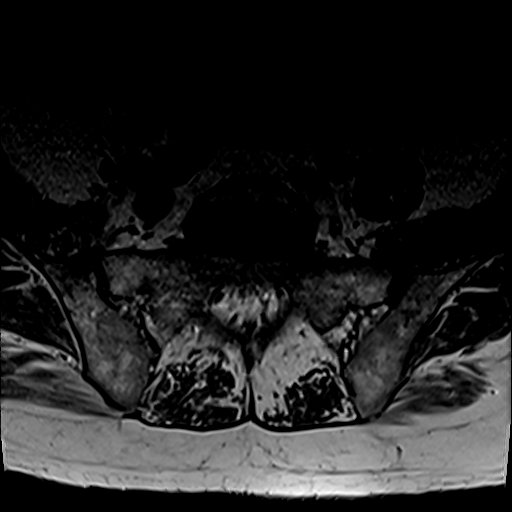
[im 11/51]
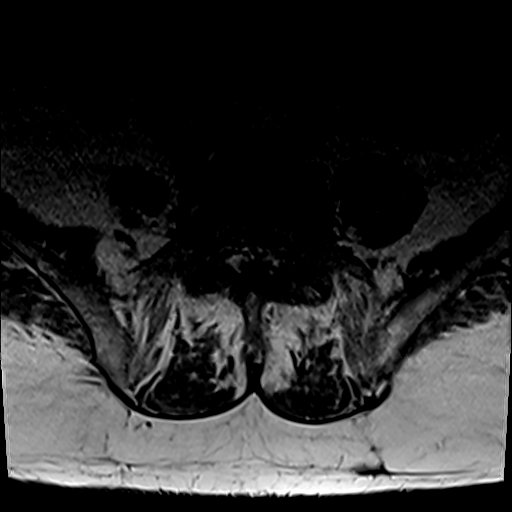
[im 16/51]
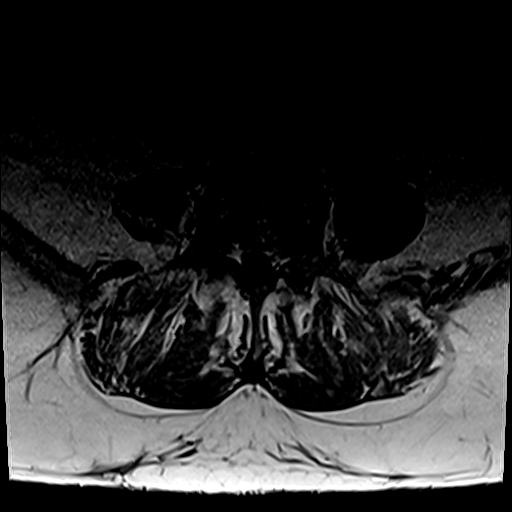
[im 21/51]
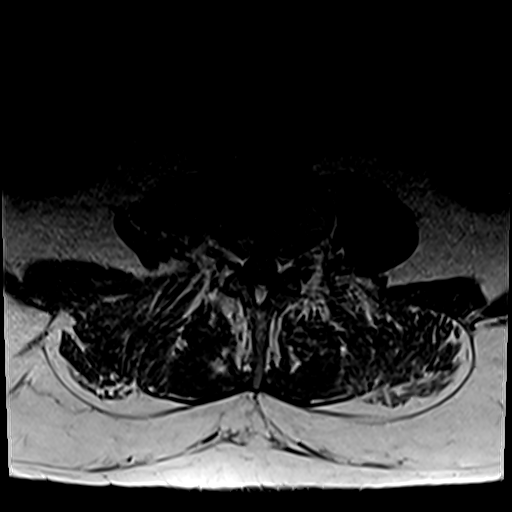
[im 26/51]
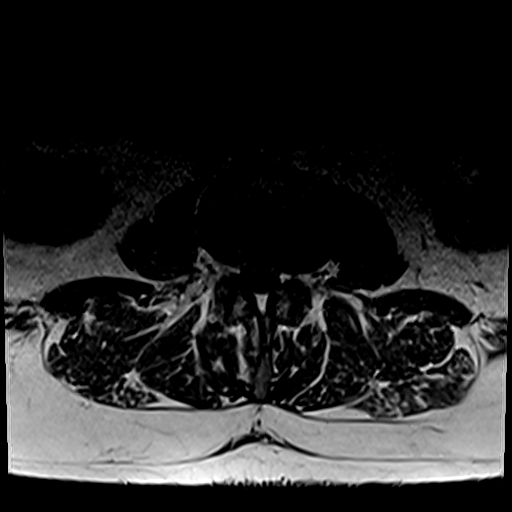
[im 31/51]
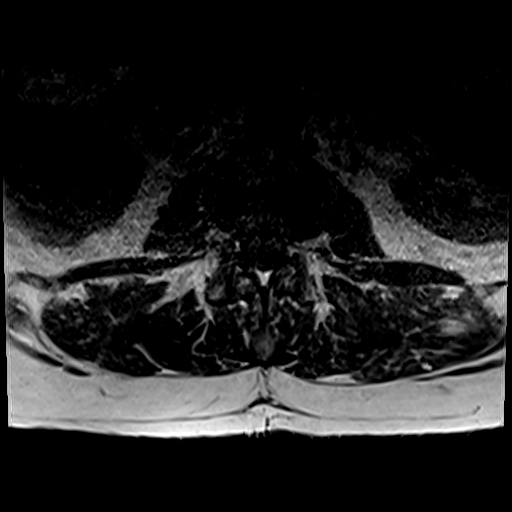
[im 36/51]
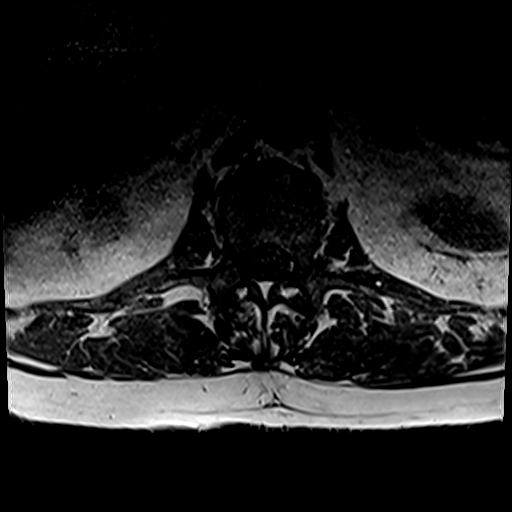
[im 41/51]
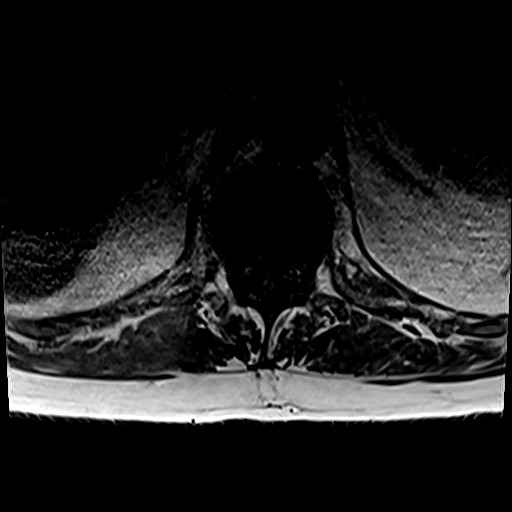
[im 46/51]
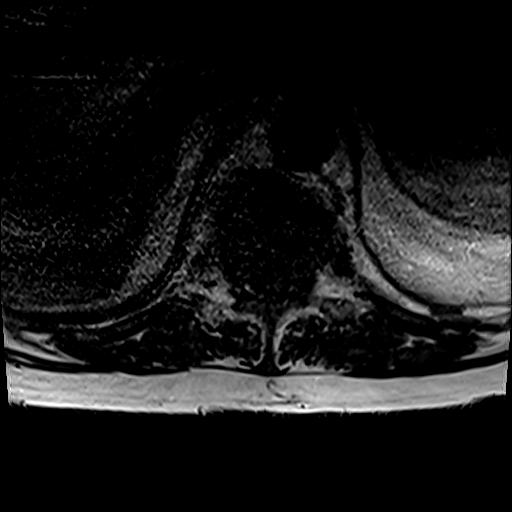
[im 51/51]
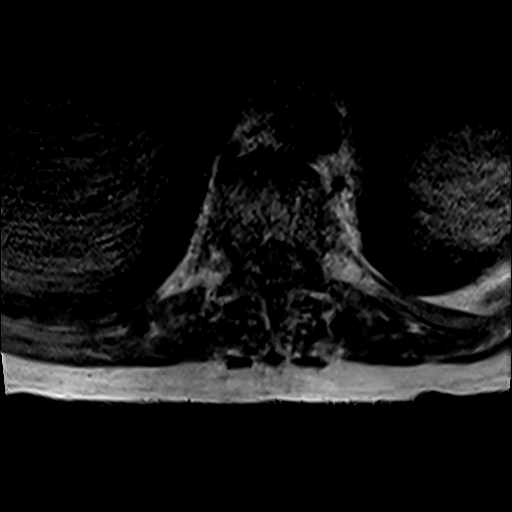

[Series 10: T2 post-contrast · sagittal · 4.0mm · 0.81mm/px · 4 of 17 slices shown]
[im 1/17]
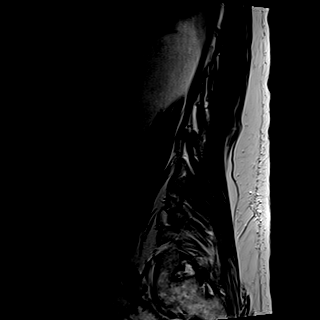
[im 6/17]
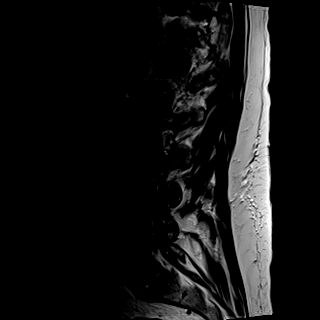
[im 11/17]
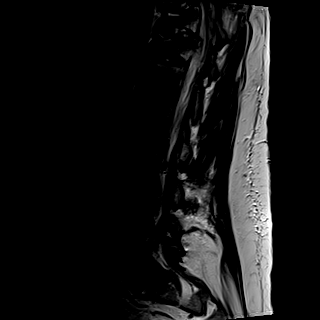
[im 17/17]
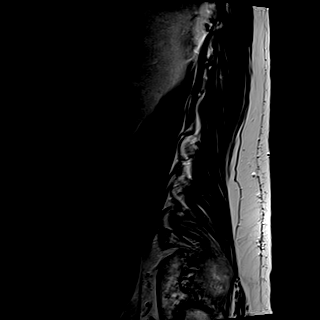

[Series 11: T1 fat-sat post-contrast · sagittal · 4.0mm · 0.81mm/px · 1 of 17 slices shown]
[im 1/17]
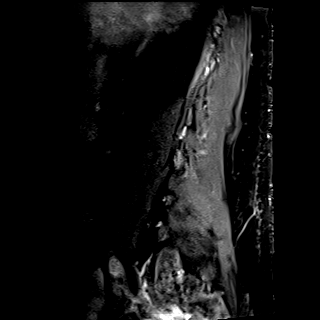

[30 of 48 positions shown; findings below may reference images not displayed]

FINDINGS: Segmentation:  Standard.

Alignment:  Physiologic.

Vertebrae: There are compression fractures of T11 and T12. T11 is
incompletely visualized. There is approximately 50% height loss at
both levels. There is 6 mm of retropulsion at T12. The intervening
disc space is normal.

Conus medullaris and cauda equina: Conus extends to the T12-L1
level. Conus and cauda equina appear normal.

Paraspinal and other soft tissues: Negative

Disc levels:

T12-L1: Unremarkable.

L1-L2: Normal disc space and facet joints. No spinal canal stenosis.
No neural foraminal stenosis.

L2-L3: Normal disc space and facet joints. No spinal canal stenosis.
No neural foraminal stenosis.

L3-L4: Normal disc space and facet joints. No spinal canal stenosis.
No neural foraminal stenosis.

L4-L5: Left asymmetric disc bulge with small annular fissure in
close proximity to the exiting left L4 nerve root. No spinal canal
stenosis. No neural foraminal stenosis.

L5-S1: Normal disc space and facet joints. No spinal canal stenosis.
No neural foraminal stenosis.

Visualized sacrum: Normal.
IMPRESSION: 1. Acute or subacute compression fractures of T11 and T12 with
approximately 50% height loss at both levels. 6 mm of retropulsion
at T12 without associated stenosis.
2. L4-L5 left asymmetric disc bulge with small annular fissure in
close proximity to the exiting left L4 nerve root.
3. Otherwise normal lumbar spine.

## 2021-05-04 MED ORDER — ONDANSETRON HCL 4 MG/2ML IJ SOLN: 4.0000 mg | Freq: Four times a day (QID) | INTRAMUSCULAR | Status: DC | PRN

## 2021-05-04 MED ORDER — ASPIRIN EC 81 MG PO TBEC
81.0000 mg | DELAYED_RELEASE_TABLET | Freq: Every day | ORAL | Status: DC
  Administered 2021-05-05 – 2021-05-13 (×9): 81 mg via ORAL
  Filled 2021-05-04 (×9): qty 1

## 2021-05-04 MED ORDER — HEPARIN SODIUM (PORCINE) 5000 UNIT/ML IJ SOLN
5000.0000 [IU] | Freq: Three times a day (TID) | INTRAMUSCULAR | Status: DC
  Administered 2021-05-04 – 2021-05-13 (×25): 5000 [IU] via SUBCUTANEOUS
  Filled 2021-05-04 (×25): qty 1

## 2021-05-04 MED ORDER — INSULIN ASPART 100 UNIT/ML IJ SOLN: 0.0000 [IU] | Freq: Every day | INTRAMUSCULAR | Status: DC

## 2021-05-04 MED ORDER — MORPHINE SULFATE (PF) 2 MG/ML IV SOLN: 2.0000 mg | INTRAVENOUS | Status: DC | PRN

## 2021-05-04 MED ORDER — SODIUM CHLORIDE 0.9 % IV SOLN: INTRAVENOUS | Status: DC

## 2021-05-04 MED ORDER — METOPROLOL TARTRATE 5 MG/5ML IV SOLN: 5.0000 mg | INTRAVENOUS | Status: DC | PRN

## 2021-05-04 MED ORDER — OXYCODONE HCL 5 MG PO TABS
5.0000 mg | ORAL_TABLET | ORAL | Status: DC | PRN
  Administered 2021-05-04 – 2021-05-13 (×13): 5 mg via ORAL
  Filled 2021-05-04 (×13): qty 1

## 2021-05-04 MED ORDER — INSULIN ASPART 100 UNIT/ML IJ SOLN: 0.0000 [IU] | Freq: Three times a day (TID) | INTRAMUSCULAR | Status: DC

## 2021-05-04 MED ORDER — DOCUSATE SODIUM 100 MG PO CAPS
100.0000 mg | ORAL_CAPSULE | Freq: Two times a day (BID) | ORAL | Status: DC
  Administered 2021-05-04 – 2021-05-13 (×18): 100 mg via ORAL
  Filled 2021-05-04 (×18): qty 1

## 2021-05-04 MED ORDER — TRAZODONE HCL 50 MG PO TABS
50.0000 mg | ORAL_TABLET | Freq: Every evening | ORAL | Status: DC | PRN
  Administered 2021-05-04 – 2021-05-08 (×4): 50 mg via ORAL
  Filled 2021-05-04 (×4): qty 1

## 2021-05-04 MED ORDER — GUAIFENESIN 100 MG/5ML PO LIQD
5.0000 mL | ORAL | Status: DC | PRN
Start: 2021-05-04 — End: 2021-05-13
  Administered 2021-05-04 – 2021-05-08 (×4): 5 mL via ORAL
  Filled 2021-05-04 (×4): qty 10

## 2021-05-04 MED ORDER — IPRATROPIUM-ALBUTEROL 0.5-2.5 (3) MG/3ML IN SOLN
3.0000 mL | RESPIRATORY_TRACT | Status: DC | PRN
  Administered 2021-05-04 – 2021-05-06 (×3): 3 mL via RESPIRATORY_TRACT
  Filled 2021-05-04 (×3): qty 3

## 2021-05-04 MED ORDER — LISINOPRIL-HYDROCHLOROTHIAZIDE 20-12.5 MG PO TABS: 1.0000 | ORAL_TABLET | Freq: Every day | ORAL | Status: DC

## 2021-05-04 MED ORDER — HYDRALAZINE HCL 20 MG/ML IJ SOLN
10.0000 mg | INTRAMUSCULAR | Status: DC | PRN
Start: 2021-05-04 — End: 2021-05-05

## 2021-05-04 MED ORDER — OXYCODONE HCL 5 MG PO TABS
5.0000 mg | ORAL_TABLET | Freq: Once | ORAL | Status: AC
  Administered 2021-05-04: 5 mg via ORAL
  Filled 2021-05-04: qty 1

## 2021-05-04 MED ORDER — ATORVASTATIN CALCIUM 10 MG PO TABS
10.0000 mg | ORAL_TABLET | Freq: Every day | ORAL | Status: DC
  Administered 2021-05-05 – 2021-05-13 (×9): 10 mg via ORAL
  Filled 2021-05-04 (×9): qty 1

## 2021-05-04 MED ORDER — SENNOSIDES-DOCUSATE SODIUM 8.6-50 MG PO TABS
1.0000 | ORAL_TABLET | Freq: Every evening | ORAL | Status: DC | PRN
Start: 2021-05-04 — End: 2021-05-06

## 2021-05-04 MED ORDER — METHOCARBAMOL 1000 MG/10ML IJ SOLN
500.0000 mg | Freq: Four times a day (QID) | INTRAVENOUS | Status: DC | PRN
  Filled 2021-05-04: qty 5

## 2021-05-04 MED ORDER — ONDANSETRON HCL 4 MG PO TABS: 4.0000 mg | ORAL_TABLET | Freq: Four times a day (QID) | ORAL | Status: DC | PRN

## 2021-05-04 MED ORDER — ACETAMINOPHEN 325 MG PO TABS
650.0000 mg | ORAL_TABLET | Freq: Four times a day (QID) | ORAL | Status: DC | PRN
  Administered 2021-05-04: 650 mg via ORAL
  Filled 2021-05-04: qty 2

## 2021-05-04 NOTE — Patient Instructions (Signed)
Oxycodone Extended-Release Tablets What is this medication? OXYCODONE (ox i KOE done) treats severe, chronic pain. It is prescribed when other pain medications do not work well enough or cannot be tolerated. It works by blocking pain signals in the brain. It belongs to a group of medications called opioids. It is a long-acting medication. Do not use it to treat sudden pain. This medicine may be used for other purposes; ask your health care provider or pharmacist if you have questions. COMMON BRAND NAME(S): OxyContin What should I tell my care team before I take this medication? They need to know if you have any of these conditions: Addison's disease Brain tumor Drug abuse or addiction Gallbladder disease Head injury Heart disease History of pancreatitis If you often drink alcohol Kidney disease Liver disease Low adrenal gland function Lung disease, asthma, or breathing problem Mental health disease Seizures Taken an MAOI like Marplan, Nardil, or Parnate in the last 14 days Thyroid disease An unusual or allergic reaction to oxycodone, other medications, foods, dyes or preservatives Pregnant or trying to get pregnant Breast-feeding How should I use this medication? Take this medication by mouth with water. Take it as directed on the prescription label at the same time every day. Do not cut, crush or chew this medication. Swallow the tablets whole. You can take this medication with or without food. If it upsets your stomach, take it with food. Keep taking it unless your care team tells you to stop. A special MedGuide will be given to you by the pharmacist with each prescription and refill. Be sure to read this information carefully each time. Talk to your care team about the use of this medication in children. Special care may be needed. Patients over 65 years of age may have a stronger reaction and need a smaller dose. Overdosage: If you think you have taken too much of this medicine  contact a poison control center or emergency room at once. NOTE: This medicine is only for you. Do not share this medicine with others. What if I miss a dose? If you miss a dose, take it as soon as you can. If it is almost time for your next dose, take only that dose. Do not take double or extra doses. What may interact with this medication? Alcohol Antihistamines for allergy, cough and cold Antiviral medications for HIV or AIDS Atropine Certain antibiotics like clarithromycin, erythromycin, linezolid, rifampin Certain medications for anxiety or sleep Certain medications for bladder problems like oxybutynin, tolterodine Certain medications for depression like amitriptyline, fluoxetine, sertraline Certain medications for fungal infections like ketoconazole, itraconazole, voriconazole Certain medications for migraine headache like almotriptan, eletriptan, frovatriptan, naratriptan, rizatriptan, sumatriptan, zolmitriptan Certain medications for nausea or vomiting like dolasetron, ondansetron, palonosetron Certain medications for Parkinson's disease like benztropine, trihexyphenidyl Certain medications for seizures like phenobarbital, phenytoin, primidone Certain medications for stomach problems like dicyclomine, hyoscyamine Certain medications for travel sickness like scopolamine Diuretics General anesthetics like halothane, isoflurane, methoxyflurane, propofol Ipratropium Local anesthetics like lidocaine, pramoxine, tetracaine MAOIs like Carbex, Eldepryl, Marplan, Nardil, and Parnate Medications that relax muscles for surgery Methylene blue Nilotinib Other narcotic medications for pain or cough Phenothiazines like chlorpromazine, mesoridazine, prochlorperazine, thioridazine This list may not describe all possible interactions. Give your health care provider a list of all the medicines, herbs, non-prescription drugs, or dietary supplements you use. Also tell them if you smoke, drink  alcohol, or use illegal drugs. Some items may interact with your medicine. What should I watch for while using this medication? Tell  your care team if your pain does not go away, if it gets worse, or if you have new or a different type of pain. You may develop tolerance to this medication. Tolerance means that you will need a higher dose of the medication for pain relief. Tolerance is normal and is expected if you take this medication for a long time. There are different types of narcotic medications (opioids) for pain. If you take more than one type at the same time, you may have more side effects. Give your care team a list of all medications you use. They will tell you how much medication to take. Do not take more medication than directed. Call emergency service right away if you have trouble breathing. Do not suddenly stop taking your medication because you may develop a severe reaction. Your body becomes used to the medication. This does NOT mean you are addicted. Addiction is a behavior related to getting and using a medication for a nonmedical reason. If you have pain, you have a medical reason to take pain medication. Your care team will tell you how much medication to take. If your care team wants you to stop the medication, the dose will be slowly lowered over time to avoid any side effects. Talk to your care team about naloxone and how to get it. Naloxone is an emergency medication used for an opioid overdose. An overdose can happen if you take too much opioid. It can also happen if an opioid is taken with some other medications or substances, like alcohol. Know the symptoms of an overdose, like trouble breathing, unusually tired or sleepy, or not being able to respond or wake up. Make sure to tell caregivers and close contacts where it is stored. Make sure they know how to use it. After naloxone is given, you must get emergency help right away. Naloxone is a temporary treatment. Repeat doses may be  needed. You may get drowsy or dizzy. Do not drive, use machinery, or do anything that needs mental alertness until you know how this medication affects you. Do not stand up or sit up quickly, especially if you are an older patient. This reduces the risk of dizzy or fainting spells. Alcohol may interfere with the effect of this medication. Avoid alcoholic drinks. This medication will cause constipation. If you do not have a bowel movement for 3 days, call your care team. Your mouth may get dry. Chewing sugarless gum or sucking hard candy and drinking plenty of water may help. Contact your care team if the problem does not go away or is severe. What side effects may I notice from receiving this medication? Side effects that you should report to your care team as soon as possible: Allergic reactions--skin rash, itching, hives, swelling of the face, lips, tongue, or throat CNS depression--slow or shallow breathing, shortness of breath, feeling faint, dizziness, confusion, difficulty staying awake Low adrenal gland function--nausea, vomiting, loss of appetite, unusual weakness or fatigue, dizziness Low blood pressure--dizziness, feeling faint or lightheaded, blurry vision Side effects that usually do not require medical attention (report to your care team if they continue or are bothersome): Constipation Dizziness Drowsiness Dry mouth Headache Nausea Vomiting This list may not describe all possible side effects. Call your doctor for medical advice about side effects. You may report side effects to FDA at 1-800-FDA-1088. Where should I keep my medication? Keep out of the reach of children and pets. This medication can be abused. Keep it in a safe place to  protect it from theft. Do not share it with anyone. It is only for you. Selling or giving away this medication is dangerous and against the law. Store at room temperature between 15 and 30 degrees C (59 and 86 degrees F). Protect from light. Keep the  container tightly closed. This medication may cause harm and death if it is taken by other adults, children, or pets. Return medication that has not been used to an official disposal site. Contact the DEA at 450-769-4089 or your city/county government to find a site. If you cannot return the medication, flush it down the toilet. Do not use the medication after the expiration date. NOTE: This sheet is a summary. It may not cover all possible information. If you have questions about this medicine, talk to your doctor, pharmacist, or health care provider.  2022 Elsevier/Gold Standard (2020-02-12 00:00:00)

## 2021-05-04 NOTE — Assessment & Plan Note (Addendum)
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  Followed by Dr. Truett Perna.  ?-Continue monitoring ?

## 2021-05-04 NOTE — Assessment & Plan Note (Addendum)
-   Continue home statin °

## 2021-05-04 NOTE — Assessment & Plan Note (Addendum)
Likely due to T11 and T12 compression fractures with 50% height loss as noted on MRI.  He has no radiculopathy, fever, bowel or bladder issues.  However, he reports about 30 pounds unintentional weight loss in several months.  No significant finding on last colonoscopy but about 10 years ago. ?-IR kyphoplasty pending insurance approval. ?-Pain control and bowel regimen ?-Continue PT/OT-no need identified. ?

## 2021-05-04 NOTE — H&P (Signed)
?History and Physical  ? ? ?Patient: Nathan Nicholson ZOX:[id number] DOB: Jan 20, 1956 ?DOA: 05/04/2021 ?DOS: the patient was seen and examined on 05/04/2021 ?PCP: [name], NP  ?Patient coming from: Home ? ?Chief Complaint: No chief complaint on file. ? ? ?HPI: TAVARAS GOODY is a 66 y.o. male with medical history significant of Felty syndrome status post splenectomy, COPD, tobacco use, HTN, rheumatoid arthritis, DM2 admitted to the hospital for lower back pain.  Patient underwent splenectomy back in 2005 due to Felty syndrome?  Now has been dealing with weight loss and lower back pain for past several weeks.  He was seen by orthopedic in [place] and had MRI spine done which showed compression fracture T12?Marland Kitchen  He was referred to oncology for leukocytosis work-up.  During the visit with Dr Myrle Sheng he was having severe lower back pain therefore requested medical team to admit the patient for pain control and further investigation. ? ?When I saw the patient he was reporting of lower back pain but denied any fevers and chills.  He does have leukocytosis on routine lab work.  Also admits of weight loss of about 30 pounds over the last several months.  Continues to smoke cigarettes. ? ?CODE STATUS-full ? ?Review of Systems: As mentioned in the history of present illness. All other systems reviewed and are negative. ?Past Medical History:  ?Diagnosis Date  ? COPD (chronic obstructive pulmonary disease) (HCC)   ? COVID-19   ? 03-2020  ? Dyspnea   ? with exertion  ? Hypertension   ? Pneumonia   ? Rheumatoid arthritis (HCC)   ? ?Past Surgical History:  ?Procedure Laterality Date  ? FOOT SURGERY    ? left  ? HERNIA REPAIR    ? SPLENECTOMY    ? TOOTH EXTRACTION    ? TOTAL HIP ARTHROPLASTY Right 08/17/2020  ? Procedure: TOTAL HIP ARTHROPLASTY ANTERIOR APPROACH;  Surgeon: Durene Romans, MD;  Location: [facility];  Service: Orthopedics;  Laterality: Right;  70 min  ? ?Social History:  reports that he has been smoking cigarettes. He  has a 20.00 pack-year smoking history. He has never used smokeless tobacco. He reports that he does not currently use alcohol. He reports that he does not use drugs. ? ?No Known Allergies ? ?Family History  ?Problem Relation Age of Onset  ? Heart attack Brother   ? ? ?Prior to Admission medications   ?Medication Sig Start Date End Date Taking? Authorizing Provider  ?albuterol (PROVENTIL) (2.5 MG/3ML) 0.083% nebulizer solution Take 2.5 mg by nebulization every 6 (six) hours as needed for wheezing or shortness of breath.    [provider]  ?aspirin 81 MG EC tablet 1 tablet    [provider]  ?atorvastatin (LIPITOR) 10 MG tablet Take 10 mg by mouth daily.    [provider]  ?Budeson-Glycopyrrol-Formoterol (BREZTRI AEROSPHERE) 160-9-4.8 MCG/ACT AERO Inhale 2 puffs into the lungs in the morning and at bedtime. ?Patient not taking: Reported on 05/04/2021 09/30/20   [name], MD  ?Budeson-Glycopyrrol-Formoterol (BREZTRI AEROSPHERE) 160-9-4.8 MCG/ACT AERO Inhale 2 puffs into the lungs in the morning and at bedtime. ?Patient not taking: Reported on 05/04/2021 09/30/20   [name], MD  ?budesonide (PULMICORT) 0.5 MG/2ML nebulizer solution Take 0.5 mg by nebulization 2 (two) times daily.    [provider]  ?cyanocobalamin (,VITAMIN B-12,) 1000 MCG/ML injection Inject 1,000 mcg into the muscle every 30 (thirty) days.    [provider]  ?folic acid (FOLVITE) 1 MG  tablet Take 1 mg by mouth daily.    [provider]  ?ipratropium-albuterol (DUONEB) 0.5-2.5 (3) MG/3ML SOLN Take 3 mLs by nebulization every 6 (six) hours as needed (Asthma).    [provider]  ?lisinopril-hydrochlorothiazide (PRINZIDE,ZESTORETIC) 20-12.5 MG per tablet Take 1 tablet by mouth daily.    [provider]  ?metFORMIN (GLUCOPHAGE-XR) 500 MG 24 hr tablet metformin ER 500 mg tablet,extended release 24 hr ? TAKE 1 TABLET BY MOUTH EVERY DAY IN THE MORNING    [provider]  ?methocarbamol (ROBAXIN) 500 MG tablet Take 1 tablet (500 mg total) by mouth every 6 (six) hours as needed for muscle spasms. 08/19/20   Cassandria Anger, PA-C  ?methotrexate (RHEUMATREX) 2.5 MG tablet Take 10 mg by mouth once a week. 09/14/20   [provider]  ?OXYGEN Inhale 2 L into the lungs continuous.    [provider]  ?Skin Protectants, Misc. (EUCERIN) cream Apply 1 application topically as needed for dry skin.    [provider]  ?Vitamin D, Ergocalciferol, (DRISDOL) 1.25 MG (50000 UNIT) CAPS capsule Take 50,000 Units by mouth every Monday.    [provider]  ? ? ?Physical Exam: ?Vitals:  ? 05/04/21 1620  ?BP: (!) 121/105  ?Pulse: (!) 102  ?Resp: 14  ?SpO2: 92%  ? ?Constitutional: Not in acute distress ?Respiratory: Clear to auscultation bilaterally ?Cardiovascular: Normal sinus rhythm, no rubs ?Abdomen: Nontender nondistended good bowel sounds ?Musculoskeletal: Lower back tender to palpation ?Skin: No rashes seen ?Neurologic: CN 2-12 grossly intact.  And nonfocal ?Psychiatric: Normal judgment and insight. Alert and oriented x 3. Normal mood. ? ?Data Reviewed: ?WBC today was 15.8. ? ?Assessment and Plan: ?* Low back pain ?Excruciating lower back pain. ?Ongoing for past several weeks, saw Dr. Drucilla Chalet and had MRI done which showed thoracic spine compression fracture he is also had some weight loss during this time. ?We will obtain lumbar spine MRI to rule out any further fractures or infection.  May need to consult IR depending on the results.  Hold off on any antibiotics at this time.  Pain control, bowel regimen. ?Given his history of smoking and weight loss, eventually will need malignancy work-up as well. ? ?HLD (hyperlipidemia) ?Resume home atorvastatin once confirmed ? ?COPD (chronic obstructive pulmonary disease) (HCC) ?Bronchodilators.  Not in acute exacerbation ? ?Benign essential HTN ?Resume his home medication once confirmed by pharmacy.  Per  records he is supposed to be on hydrochlorothiazide/lisinopril ? ?Leukocytosis ?Appears to be chronic.  Apparently saw Dr Myrle Sheng for this several years ago and underwent splenectomy 2005 with history of Felty syndrome. ? ?DM2 (diabetes mellitus, type 2) (HCC) ?Home metformin on hold.  We will place him on sliding scale and Accu-Cheks ? ? ? ? ? ? ?Advance Care Planning:   Code Status: Full Code  ? ?Consults: May require IR consultation depending on MRI results ? ?Family Communication: Wife and daughter at bedside ? ?Severity of Illness: ?The appropriate patient status for this patient is OBSERVATION. Observation status is judged to be reasonable and necessary in order to provide the required intensity of service to ensure the patient's safety. The patient's presenting symptoms, physical exam findings, and initial radiographic and laboratory data in the context of their medical condition is felt to place them at decreased risk for further clinical deterioration. Furthermore, it is anticipated that the patient will be medically stable for discharge from the hospital within 2 midnights of admission.  ? ?Author: ?[name] Joline Maxcy, MD ?05/04/2021  5:02 PM ? ?For on call review [email or web address].  ?

## 2021-05-04 NOTE — Assessment & Plan Note (Addendum)
Controlled.  A1c 5.9%. ?Recent Labs  ?Lab 05/06/21 ?1207 05/06/21 ?1637 05/06/21 ?2020 05/07/21 ?0737 05/07/21 ?1145  ?GLUCAP 82 83 98 88 107*  ?-CBG within normal range.  Did not require insulin.  Discontinue CBG monitoring and SSI. ? ?

## 2021-05-04 NOTE — Assessment & Plan Note (Addendum)
Normotensive ?-Continue amlodipine ?-Continue holding lisinopril and HCTZ ?-P.o. hydralazine as needed ?

## 2021-05-04 NOTE — Telephone Encounter (Signed)
Faxed over a request of MRI result to [facility] [phone]. ?

## 2021-05-04 NOTE — Assessment & Plan Note (Addendum)
Shortness of breath improved.  Still with some rhonchi but improved. ?-Added LAMA/LABA/Pulmicort on 3/3. ?-Continue DuoNeb as needed ?-Incentive spirometry/OOB/PT/OT ?

## 2021-05-05 ENCOUNTER — Inpatient Hospital Stay (HOSPITAL_COMMUNITY): Payer: Medicare HMO

## 2021-05-05 DIAGNOSIS — Z720 Tobacco use: Secondary | ICD-10-CM | POA: Diagnosis not present

## 2021-05-05 DIAGNOSIS — R634 Abnormal weight loss: Secondary | ICD-10-CM

## 2021-05-05 DIAGNOSIS — Z20822 Contact with and (suspected) exposure to covid-19: Secondary | ICD-10-CM | POA: Diagnosis not present

## 2021-05-05 DIAGNOSIS — Z9981 Dependence on supplemental oxygen: Secondary | ICD-10-CM | POA: Diagnosis not present

## 2021-05-05 DIAGNOSIS — Z79899 Other long term (current) drug therapy: Secondary | ICD-10-CM | POA: Diagnosis not present

## 2021-05-05 DIAGNOSIS — M069 Rheumatoid arthritis, unspecified: Secondary | ICD-10-CM | POA: Diagnosis present

## 2021-05-05 DIAGNOSIS — Z9081 Acquired absence of spleen: Secondary | ICD-10-CM | POA: Diagnosis not present

## 2021-05-05 DIAGNOSIS — Z7951 Long term (current) use of inhaled steroids: Secondary | ICD-10-CM | POA: Diagnosis not present

## 2021-05-05 DIAGNOSIS — Z7984 Long term (current) use of oral hypoglycemic drugs: Secondary | ICD-10-CM | POA: Diagnosis not present

## 2021-05-05 DIAGNOSIS — F17210 Nicotine dependence, cigarettes, uncomplicated: Secondary | ICD-10-CM | POA: Diagnosis present

## 2021-05-05 DIAGNOSIS — E119 Type 2 diabetes mellitus without complications: Secondary | ICD-10-CM | POA: Diagnosis not present

## 2021-05-05 DIAGNOSIS — E871 Hypo-osmolality and hyponatremia: Secondary | ICD-10-CM | POA: Diagnosis not present

## 2021-05-05 DIAGNOSIS — Z8616 Personal history of COVID-19: Secondary | ICD-10-CM | POA: Diagnosis not present

## 2021-05-05 DIAGNOSIS — M5126 Other intervertebral disc displacement, lumbar region: Secondary | ICD-10-CM | POA: Diagnosis not present

## 2021-05-05 DIAGNOSIS — D72829 Elevated white blood cell count, unspecified: Secondary | ICD-10-CM | POA: Diagnosis not present

## 2021-05-05 DIAGNOSIS — J441 Chronic obstructive pulmonary disease with (acute) exacerbation: Secondary | ICD-10-CM | POA: Diagnosis not present

## 2021-05-05 DIAGNOSIS — E669 Obesity, unspecified: Secondary | ICD-10-CM | POA: Diagnosis not present

## 2021-05-05 DIAGNOSIS — D72820 Lymphocytosis (symptomatic): Secondary | ICD-10-CM | POA: Diagnosis not present

## 2021-05-05 DIAGNOSIS — J449 Chronic obstructive pulmonary disease, unspecified: Secondary | ICD-10-CM | POA: Diagnosis not present

## 2021-05-05 DIAGNOSIS — D72825 Bandemia: Secondary | ICD-10-CM

## 2021-05-05 DIAGNOSIS — I10 Essential (primary) hypertension: Secondary | ICD-10-CM | POA: Diagnosis not present

## 2021-05-05 DIAGNOSIS — M4854XA Collapsed vertebra, not elsewhere classified, thoracic region, initial encounter for fracture: Secondary | ICD-10-CM | POA: Diagnosis not present

## 2021-05-05 DIAGNOSIS — J9601 Acute respiratory failure with hypoxia: Secondary | ICD-10-CM | POA: Diagnosis not present

## 2021-05-05 DIAGNOSIS — N179 Acute kidney failure, unspecified: Secondary | ICD-10-CM

## 2021-05-05 DIAGNOSIS — S32000A Wedge compression fracture of unspecified lumbar vertebra, initial encounter for closed fracture: Secondary | ICD-10-CM | POA: Diagnosis not present

## 2021-05-05 DIAGNOSIS — E876 Hypokalemia: Secondary | ICD-10-CM | POA: Diagnosis not present

## 2021-05-05 DIAGNOSIS — R630 Anorexia: Secondary | ICD-10-CM | POA: Diagnosis present

## 2021-05-05 DIAGNOSIS — G8929 Other chronic pain: Secondary | ICD-10-CM | POA: Diagnosis not present

## 2021-05-05 DIAGNOSIS — M545 Low back pain, unspecified: Secondary | ICD-10-CM | POA: Diagnosis not present

## 2021-05-05 DIAGNOSIS — Z6834 Body mass index (BMI) 34.0-34.9, adult: Secondary | ICD-10-CM | POA: Diagnosis not present

## 2021-05-05 DIAGNOSIS — K5903 Drug induced constipation: Secondary | ICD-10-CM | POA: Diagnosis not present

## 2021-05-05 DIAGNOSIS — E785 Hyperlipidemia, unspecified: Secondary | ICD-10-CM | POA: Diagnosis present

## 2021-05-05 LAB — CBC
HCT: 41.1 % (ref 39.0–52.0)
Hemoglobin: 13.2 g/dL (ref 13.0–17.0)
MCH: 29.1 pg (ref 26.0–34.0)
MCHC: 32.1 g/dL (ref 30.0–36.0)
MCV: 90.7 fL (ref 80.0–100.0)
Platelets: 382 10*3/uL (ref 150–400)
RBC: 4.53 MIL/uL (ref 4.22–5.81)
RDW: 15.5 % (ref 11.5–15.5)
WBC: 12.4 10*3/uL — ABNORMAL HIGH (ref 4.0–10.5)
nRBC: 0 % (ref 0.0–0.2)

## 2021-05-05 LAB — COMPREHENSIVE METABOLIC PANEL
ALT: 21 U/L (ref 0–44)
AST: 19 U/L (ref 15–41)
Albumin: 3 g/dL — ABNORMAL LOW (ref 3.5–5.0)
Alkaline Phosphatase: 108 U/L (ref 38–126)
Anion gap: 6 (ref 5–15)
BUN: 19 mg/dL (ref 8–23)
CO2: 30 mmol/L (ref 22–32)
Calcium: 8.6 mg/dL — ABNORMAL LOW (ref 8.9–10.3)
Chloride: 96 mmol/L — ABNORMAL LOW (ref 98–111)
Creatinine, Ser: 1.49 mg/dL — ABNORMAL HIGH (ref 0.61–1.24)
GFR, Estimated: 52 mL/min — ABNORMAL LOW (ref >60)
Glucose, Bld: 91 mg/dL (ref 70–99)
Potassium: 3.5 mmol/L (ref 3.5–5.1)
Sodium: 132 mmol/L — ABNORMAL LOW (ref 135–145)
Total Bilirubin: 0.6 mg/dL (ref 0.3–1.2)
Total Protein: 7.1 g/dL (ref 6.5–8.1)

## 2021-05-05 LAB — GLUCOSE, CAPILLARY
Glucose-Capillary: 107 mg/dL — ABNORMAL HIGH (ref 70–99)
Glucose-Capillary: 75 mg/dL (ref 70–99)
Glucose-Capillary: 93 mg/dL (ref 70–99)
Glucose-Capillary: 105 mg/dL — ABNORMAL HIGH (ref 70–99)
Glucose-Capillary: 109 mg/dL — ABNORMAL HIGH (ref 70–99)

## 2021-05-05 LAB — MAGNESIUM: Magnesium: 1.8 mg/dL (ref 1.7–2.4)

## 2021-05-05 MED ORDER — AMLODIPINE BESYLATE 10 MG PO TABS
10.0000 mg | ORAL_TABLET | Freq: Every day | ORAL | Status: DC
  Administered 2021-05-05 – 2021-05-13 (×9): 10 mg via ORAL
  Filled 2021-05-05 (×9): qty 1

## 2021-05-05 MED ORDER — GADOBUTROL 1 MMOL/ML IV SOLN
10.0000 mL | Freq: Once | INTRAVENOUS | Status: AC | PRN
  Administered 2021-05-05: 10 mL via INTRAVENOUS

## 2021-05-05 MED ORDER — HYDRALAZINE HCL 25 MG PO TABS
25.0000 mg | ORAL_TABLET | Freq: Four times a day (QID) | ORAL | Status: DC | PRN
Start: 2021-05-05 — End: 2021-05-13

## 2021-05-05 MED ORDER — LORAZEPAM 2 MG/ML IJ SOLN
2.0000 mg | Freq: Once | INTRAMUSCULAR | Status: AC | PRN
  Administered 2021-05-05: 2 mg via INTRAVENOUS
  Filled 2021-05-05: qty 1

## 2021-05-05 NOTE — Assessment & Plan Note (Addendum)
Patient reports about 30 pounds unintentional weight loss in several months.  Reportedly normal colonoscopy 10 years ago.  He is due for repeat.  He denies melena or hematochezia.  No respiratory status change other than his chronic COPD. ?-Defer further malignancy work-up to oncology ?-He says he is due for his routine colonoscopy. ?

## 2021-05-05 NOTE — Assessment & Plan Note (Signed)
Encouraged tobacco cessation. ?-Nicotine patch as needed ?

## 2021-05-05 NOTE — Assessment & Plan Note (Addendum)
Recent Labs  ?  08/10/20 ?0858 08/17/20 ?1228 08/18/20 ?0458 05/04/21 ?1100 05/05/21 ?6270 05/06/21 ?0503 05/07/21 ?3500  ?BUN 13 9 11 10 19 15 13   ?CREATININE 0.78 0.86 0.92 0.94 1.49* 1.01 0.87  ?Likely from dehydration, lisinopril and HCTZ.  Resolved. ?-Continue holding lisinopril and HCTZ. ?-Check intermittently ? ?

## 2021-05-05 NOTE — Progress Notes (Signed)
Patient transported via bed to MRI. Sedation medication was administered as ordered.  ?

## 2021-05-05 NOTE — Evaluation (Signed)
Physical Therapy Evaluation ?Patient Details ?Name: Nathan Nicholson ?MRN: [id number] ?DOB: September 03, 1955 ?Today's Date: 05/05/2021 ? ?History of Present Illness ? Patient is a 66 year old male admitted from oncology while there for work up of leukocytosis. While in office patient having severe back pain. Patient had MRI by orthopedics in [place] revealing T12 cmopression fx. PMH: Felty syndrome status post splenectomy, COPD, tobacco use, HTN, rheumatoid arthritis, DMII  ?Clinical Impression ?   The patient reports pain controlled after  medication, ambulated x 140' with RW, uses rollator at  baseline recently. ? Patient awaiting further tests and IR consult. Continue  PT/mobility as tolerated. ?Pt admitted with above diagnosis.  Pt currently with functional limitations due to the deficits listed below (see PT Problem List). Pt will benefit from skilled PT to increase their independence and safety with mobility to allow discharge to the venue listed below.   ? SPO2 95 on RA, noted frequent cough. ? ?   ? ?Recommendations for follow up therapy are one component of a multi-disciplinary discharge planning process, led by the attending physician.  Recommendations may be updated based on patient status, additional functional criteria and insurance authorization. ? ?Follow Up Recommendations No PT follow up ? ?  ?Assistance Recommended at Discharge    ?Patient can return home with the following ? A little help with walking and/or transfers;Assistance with cooking/housework;Assist for transportation;A little help with bathing/dressing/bathroom;Help with stairs or ramp for entrance ? ?  ?Equipment Recommendations None recommended by PT  ?Recommendations for Other Services ?    ?  ?Functional Status Assessment Patient has had a recent decline in their functional status and demonstrates the ability to make significant improvements in function in a reasonable and predictable amount of time.  ? ?  ?Precautions / Restrictions  Precautions ?Precautions: Back ?Precaution Comments: T12 compression fracture ?Restrictions ?Weight Bearing Restrictions: No  ? ?  ? ?Mobility ? Bed Mobility ?  ?  ?  ?  ?  ?  ?  ?  ?  ? ?Transfers ?Overall transfer level: Needs assistance ?Equipment used: Rolling walker (2 wheels) ?Transfers: Sit to/from Stand ?Sit to Stand: Min guard ?  ?  ?  ?  ?  ?  ?  ? ?Ambulation/Gait ?Ambulation/Gait assistance: Min guard ?Gait Distance (Feet): 140 Feet ?Assistive device: Rolling walker (2 wheels) ?Gait Pattern/deviations: Step-through pattern ?Gait velocity: decreased ?  ?  ?General Gait Details: cues for position ? ?Stairs ?  ?  ?  ?  ?  ? ?Wheelchair Mobility ?  ? ?Modified Rankin (Stroke Patients Only) ?  ? ?  ? ?Balance Overall balance assessment: Needs assistance ?  ?  ?  ?  ?  ?  ?  ?  ?  ?  ?  ?  ?  ?  ?  ?  ?  ?  ?   ? ? ? ?Pertinent Vitals/Pain Pain Assessment ?Faces Pain Scale: Hurts little more ?Pain Location: Back ?Pain Descriptors / Indicators: Sore ?Pain Intervention(s): Monitored during session, Premedicated before session  ? ? ?Home Living Family/patient expects to be discharged to:: Private residence ?Living Arrangements: Spouse/significant other ?Available Help at Discharge: Family ?Type of Home: Mobile home ?Home Access: Stairs to enter ?Entrance Stairs-Rails: Right;Left ?Entrance Stairs-Number of Steps: 5 ?  ?Home Layout: Able to live on main level with bedroom/bathroom ?Home Equipment: Rollator (4 wheels);Rolling Walker (2 wheels);BSC/3in1;Cane - single point;Shower seat ?   ?  ?Prior Function Prior Level of Function : Independent/Modified Independent ?  ?  ?  ?  ?  ?  ?  Mobility Comments: uses rollator ?ADLs Comments: patient reports since back pain began spouse has been assisting with dressing tasks ?  ? ? ?Hand Dominance  ? Dominant Hand:  (did not specify) ? ?  ?Extremity/Trunk Assessment  ? Upper Extremity Assessment ?Upper Extremity Assessment: Overall WFL for tasks assessed ?  ? ?Lower Extremity  Assessment ?Lower Extremity Assessment: Generalized weakness ?  ? ?Cervical / Trunk Assessment ?Cervical / Trunk Assessment: Other exceptions ?Cervical / Trunk Exceptions: guarded to move to sitting  ?Communication  ? Communication: No difficulties  ?Cognition Arousal/Alertness: Awake/alert ?  ?  ?  ?  ?  ?  ?  ?  ?  ?  ?  ?  ?  ?  ?  ?  ?  ?  ?  ?  ?  ? ?  ?General Comments   ? ?  ?Exercises    ? ?Assessment/Plan  ?  ?PT Assessment Patient needs continued PT services  ?PT Problem List Decreased strength;Decreased mobility;Decreased safety awareness;Decreased activity tolerance;Decreased knowledge of use of DME;Decreased knowledge of precautions ? ?   ?  ?PT Treatment Interventions DME instruction;Therapeutic activities;Gait training;Therapeutic exercise;Patient/family education;Functional mobility training   ? ?PT Goals (Current goals can be found in the Care Plan section)  ?Acute Rehab PT Goals ?Patient Stated Goal: to get MRI ?PT Goal Formulation: With patient ?Time For Goal Achievement: 05/19/21 ?Potential to Achieve Goals: Good ? ?  ?Frequency Min 3X/week ?  ? ? ?Co-evaluation PT/OT/SLP Co-Evaluation/Treatment: Yes ?Reason for Co-Treatment: For patient/therapist safety ?PT goals addressed during session: Mobility/safety with mobility ?OT goals addressed during session: ADL's and self-care ?  ? ? ?  ?AM-PAC PT "6 Clicks" Mobility  ?Outcome Measure Help needed turning from your back to your side while in a flat bed without using bedrails?: A Little ?Help needed moving from lying on your back to sitting on the side of a flat bed without using bedrails?: A Little ?Help needed moving to and from a bed to a chair (including a wheelchair)?: A Little ?Help needed standing up from a chair using your arms (e.g., wheelchair or bedside chair)?: A Little ?Help needed to walk in hospital room?: A Little ?Help needed climbing 3-5 steps with a railing? : A Little ?6 Click Score: 18 ? ?  ?End of Session   ?Activity Tolerance:  Patient tolerated treatment well ?Patient left: in bed;with call bell/phone within reach;with family/visitor present ?Nurse Communication: Mobility status ?PT Visit Diagnosis: Other symptoms and signs involving the nervous system (R29.898);Unsteadiness on feet (R26.81);Pain ?  ? ?Time: [id number] ?PT Time Calculation (min) (ACUTE ONLY): 16 min ? ? ?Charges:   PT Evaluation ?$PT Eval Low Complexity: 1 Low ?  ?  ?   ? ? ?Tresa Endo PT ?Acute Rehabilitation Services ?Pager [phone] ?Office [phone] ? ? ?Claretha Cooper ?05/05/2021, 3:20 PM ? ?

## 2021-05-05 NOTE — Discharge Planning (Signed)
Oncology Discharge Planning Admission Note ? ?[facility] ?Address: [address] [place], [place], [place] [address] ?Hours of Operation:  8am - 5pm, Monday - Friday  ?Clinic Contact Information:  [phone]) [phone] ? ?Oncology Care Team: ?Medical Oncologist:   ? ? Nathan Milliner, NP is aware of this hospital admission dated 05/04/21 and has assessed patient at the bedside.  The cancer center will follow Nathan Nicholson?s inpatient care to assist with discharge planning as indicated by the oncologist.  We will arrange necessary follow up closer to diacharge. ? ?Disclaimer:  This Cancer Center nursing note does not imply a formal consult request has been made by the admitting attending for this admission or there will be an inpatient consult completed by oncology.  Please request oncology consults as per standard process as indicated. ?

## 2021-05-05 NOTE — Progress Notes (Addendum)
HEMATOLOGY-ONCOLOGY PROGRESS NOTE ? 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 ?Nathan Nicholson has been admitted for low back pain thought to be related to his benign compression fractures of the thoracic spine.  MRI of the lumbar spine is currently pending.  The patient would benefit from IR evaluation for consideration of kyphoplasty or vertebroplasty.  He is receiving pain medication per the hospitalist team. ? ?He has had significant recent weight loss.  At this time, there is no convincing evidence of malignancy.  Would consider additional work-up for malignancy if weight loss persists. ? ?His creatinine is up slightly this morning.  Recommend rechecking again in the morning ? ?Recommendations: ?1.  MRI of lumbar spine per hospitalist. ?2.  IR evaluation for consideration of kyphoplasty or vertebroplasty. ?3.  Pain management per hospitalist. ?4.  We will consider additional work-up for malignancy if weight loss persists. ? ?Outpatient follow-up will be scheduled at the cancer center once he is discharged from the hospital. ? ?[name], DNP, AGPCNP-BC, AOCNP ?Nathan Nicholson was interviewed and examined.  He continues to have lower back pain.  The pain appears to be at the level of the thoracic compression fractures.  He is scheduled for a lumbar MRI.  The pain has persisted/progressed over the past month.  I recommend consultation with interventional radiology to consider vertebroplasty. ? ?There is no other evidence of a malignancy.  We can initiate additional diagnostic evaluation if he has progressive weight loss. ? ?Outpatient follow-up will  be scheduled at the cancer center. ? ?I was present for rhythm 50% of today's visit.  I performed medical decision making. ? ? ?SUBJECTIVE: Nathan Nicholson has a history of Felty syndrome with pancytopenia and underwent splenectomy in April 2005.  He has chronic leukocytosis (lymphocytosis) thought to be related to the postsplenectomy state.  He was referred back to [place] for evaluation of leukocytosis.  He was seen in our office yesterday.  During that visit, he was reporting severe back pain for about the past month.  He had an outpatient MRI of the thoracic spine at an outside hospital which showed T11/T12 compression fractures.  Due to uncontrolled pain, he was referred for admission. ? ?This morning, he reports ongoing back pain.  Currently receiving oxycodone.  Waiting MRI of lumbar spine. ? ?PHYSICAL EXAMINATION: ? ?Vitals:  ? 05/05/21 0056 05/05/21 0500  ?BP: 99/64 108/71  ?Pulse: 83 69  ?Resp: 18 18  ?Temp: 97.6 ?F (36.4 ?C) 97.7 ?F (36.5 ?C)  ?SpO2: 95% 97%  ? ?There were no vitals filed for this visit. ? ?Intake/Output from previous day: ?03/01 0701 - 03/02 0700 ?In: 765.1 [I.V.:765.1] ?Out: 300 [Urine:300] ? ?Physical Exam ?Vitals reviewed.  ?Constitutional:   ?   General: He is not in acute distress. ?HENT:  ?   Head: Normocephalic.  ?Eyes:  ?   General: No scleral icterus. ?   Conjunctiva/sclera: Conjunctivae normal.  ?Cardiovascular:  ?   Rate and Rhythm: Normal rate and regular rhythm.  ?Pulmonary:  ?   Effort: Pulmonary effort is normal. No respiratory distress.  ?Abdominal:  ?   Palpations: Abdomen is soft.  ?   Tenderness: There is no abdominal tenderness.  ?Musculoskeletal:  ?   Comments: Pain with palpation  over the lower thoracic spine  ?Skin: ?   General: Skin is warm and dry.  ?Neurological:  ?   Mental Status: He is alert and oriented to person, place, and time.  ? ? ?LABORATORY DATA:  ?I have reviewed the data as listed ?CMP Latest Ref Rng & Units 05/05/2021 05/04/2021 08/18/2020  ?Glucose 70 - 99 mg/dL  91 121(H) 191(H)  ?BUN 8 - 23 mg/dL 19 10 11   ?Creatinine 0.61 - 1.24 mg/dL 1.49(H) 0.94 0.92  ?Sodium 135 - 145 mmol/L 132(L) 137 133(L)  ?Potassium 3.5 - 5.1 mmol/L 3.5 3.7 3.9  ?Chloride 98 - 111 mmol/L 96(L) 95(L) 101  ?CO2 22 - 32 mmol/L 30 32 26  ?Calcium 8.9 - 10.3 mg/dL 8.6(L) 10.4(H) 8.5(L)  ?Total Protein 6.5 - 8.1 g/dL 7.1 8.3(H) -  ?Total Bilirubin 0.3 - 1.2 mg/dL 0.6 0.8 -  ?Alkaline Phos 38 - 126 U/L 108 142(H) -  ?AST 15 - 41 U/L 19 24 -  ?ALT 0 - 44 U/L 21 28 -  ? ? ?Lab Results  ?Component Value Date  ? WBC 12.4 (H) 05/05/2021  ? HGB 13.2 05/05/2021  ? HCT 41.1 05/05/2021  ? MCV 90.7 05/05/2021  ? PLT 382 05/05/2021  ? NEUTROABS 10.3 (H) 05/04/2021  ? ? ?No results found for: CEA1, CEA, [id number], CA125, PSA1 ? ?No results found. ? ? ?No future appointments.  ? ? LOS: 1 day  ?

## 2021-05-05 NOTE — Evaluation (Signed)
Occupational Therapy Evaluation ?Patient Details ?Name: Nathan Nicholson ?MRN: [id number] ?DOB: Jul 09, 1955 ?Today's Date: 05/05/2021 ? ? ?History of Present Illness Patient is a 66 year old male admitted from oncology while there for work up of leukocytosis. While in office patient having severe back pain. Patient had MRI by orthopedics in [place] revealing T12 cmopression fx. PMH: Felty syndrome status post splenectomy, COPD, tobacco use, HTN, rheumatoid arthritis, DMII  ? ?Clinical Impression ?  ? Patient lives with spouse and typically is independent at baseline however reports since back pain started spouse has been assisting him with dressing. Patient does have walker and rollator at home which he reports using as needed. Currently patient is min G assist for functional ambulation using rolling walker and mod A for lower body ADLs due to back pain. Patient was able to perform figure 4 method while sitting at edge of bed however reports increased back pain. May benefit from AE education. Acute OT to follow, do not anticipate OT needs at D/C.  ?   ? ?Recommendations for follow up therapy are one component of a multi-disciplinary discharge planning process, led by the attending physician.  Recommendations may be updated based on patient status, additional functional criteria and insurance authorization.  ? ?Follow Up Recommendations ? No OT follow up  ?  ?Assistance Recommended at Discharge Intermittent Supervision/Assistance  ?Patient can return home with the following A little help with bathing/dressing/bathroom;Assistance with cooking/housework ? ?  ?Functional Status Assessment ? Patient has had a recent decline in their functional status and demonstrates the ability to make significant improvements in function in a reasonable and predictable amount of time.  ?Equipment Recommendations ? None recommended by OT  ?  ?   ?Precautions / Restrictions Precautions ?Precautions: Back ?Precaution Comments: T12 compression  fracture ?Restrictions ?Weight Bearing Restrictions: No  ? ?  ? ?Mobility Bed Mobility ?Overal bed mobility: Needs Assistance ?Bed Mobility: Rolling, Sidelying to Sit, Sit to Sidelying ?Rolling: Supervision ?Sidelying to sit: Min guard ?  ?  ?Sit to sidelying: Min assist ?General bed mobility comments: educate patient in log roll technique to minimize back pain/strain. HOB elevated as patient sleeps in recliner at home. Min A to lift legs back onto bed ?  ? ? ? ?  ?Balance Overall balance assessment: Needs assistance ?Sitting-balance support: Feet supported ?  ?  ?  ?Standing balance support: No upper extremity supported, Bilateral upper extremity supported ?Standing balance-Leahy Scale: Fair ?Standing balance comment: Fair static standing, utilizes walker for dynamic/walking ?  ?  ?  ?  ?  ?  ?  ?  ?  ?  ?  ?   ? ?ADL either performed or assessed with clinical judgement  ? ?ADL Overall ADL's : Needs assistance/impaired ?Eating/Feeding: Independent ?  ?Grooming: Set up ?  ?Upper Body Bathing: Sitting;Minimal assistance ?  ?Lower Body Bathing: Moderate assistance;Sitting/lateral leans;Sit to/from stand ?  ?Upper Body Dressing : Minimal assistance;Sitting ?Upper Body Dressing Details (indicate cue type and reason): due to low back pain ?Lower Body Dressing: Moderate assistance;Sitting/lateral leans;Sit to/from stand ?Lower Body Dressing Details (indicate cue type and reason): Patient demonstrates ability to perform figure 4 method however does report pain in L lower back ?Toilet Transfer: Min guard;Ambulation;Rolling walker (2 wheels) ?Toilet Transfer Details (indicate cue type and reason): min G for safety ?Toileting- Clothing Manipulation and Hygiene: Minimal assistance;Sitting/lateral lean;Sit to/from stand ?  ?  ?  ?Functional mobility during ADLs: Min guard;Rolling walker (2 wheels) ?   ? ? ? ? ?  Pertinent Vitals/Pain Pain Assessment ?Pain Assessment: Faces ?Faces Pain Scale: Hurts little more ?Pain Location:  Back ?Pain Descriptors / Indicators: Sore ?Pain Intervention(s): Monitored during session  ? ? ? ?Hand Dominance  (did not specify) ?  ?Extremity/Trunk Assessment Upper Extremity Assessment ?Upper Extremity Assessment: Overall WFL for tasks assessed ?  ?Lower Extremity Assessment ?Lower Extremity Assessment: Defer to PT evaluation ?  ?  ?  ?Communication Communication ?Communication: No difficulties ?  ?Cognition Arousal/Alertness: Awake/alert ?Behavior During Therapy: [facility] for tasks assessed/performed ?Overall Cognitive Status: Within Functional Limits for tasks assessed ?  ?  ?  ?  ?  ?  ?  ?  ?  ?  ?  ?  ?  ?  ?  ?  ?  ?  ?  ?   ?   ?   ? ? ?Home Living Family/patient expects to be discharged to:: Private residence ?Living Arrangements: Spouse/significant other ?Available Help at Discharge: Family ?Type of Home: Mobile home ?Home Access: Stairs to enter ?Entrance Stairs-Number of Steps: 5 ?Entrance Stairs-Rails: Right;Left ?Home Layout: Able to live on main level with bedroom/bathroom ?  ?  ?Bathroom Shower/Tub: Psychologist, counselling;Tub/shower unit ?  ?Bathroom Toilet: Handicapped height ?  ?  ?Home Equipment: Rollator (4 wheels);Rolling Walker (2 wheels);BSC/3in1;Cane - single point;Shower seat ?  ?  ?  ? ?  ?Prior Functioning/Environment Prior Level of Function : Independent/Modified Independent ?  ?  ?  ?  ?  ?  ?Mobility Comments: uses rollator ?ADLs Comments: patient reports since back pain began spouse has been assisting with dressing tasks ?  ? ?  ?  ?OT Problem List: Pain;Decreased activity tolerance;Impaired balance (sitting and/or standing);Decreased safety awareness;Obesity ?  ?   ?OT Treatment/Interventions: Self-care/ADL training;Balance training;Patient/family education;Therapeutic activities;DME and/or AE instruction  ?  ?OT Goals(Current goals can be found in the care plan section) Acute Rehab OT Goals ?Patient Stated Goal: Home with spouse ?OT Goal Formulation: With patient ?Time For Goal Achievement:  05/19/21 ?Potential to Achieve Goals: Good  ?OT Frequency: Min 2X/week ?  ? ?   ?AM-PAC OT "6 Clicks" Daily Activity     ?Outcome Measure Help from another person eating meals?: None ?Help from another person taking care of personal grooming?: A Little ?Help from another person toileting, which includes using toliet, bedpan, or urinal?: A Little ?Help from another person bathing (including washing, rinsing, drying)?: A Lot ?Help from another person to put on and taking off regular upper body clothing?: A Little ?Help from another person to put on and taking off regular lower body clothing?: A Lot ?6 Click Score: 17 ?  ?End of Session Equipment Utilized During Treatment: Gait belt;Rolling walker (2 wheels) ?Nurse Communication: Mobility status ? ?Activity Tolerance: Patient tolerated treatment well ?Patient left: in bed;with call bell/phone within reach;with family/visitor present ? ?OT Visit Diagnosis: Pain;Unsteadiness on feet (R26.81) ?Pain - part of body:  (back)  ?              ?Time: 7588-3254 ?OT Time Calculation (min): 15 min ?Charges:  OT General Charges ?$OT Visit: 1 Visit ?OT Evaluation ?$OT Eval Low Complexity: 1 Low ? ?Marlyce Huge OT ?OT pager: [phone] ? ?Carmelia Roller ?05/05/2021, 3:00 PM ?

## 2021-05-05 NOTE — Plan of Care (Signed)

## 2021-05-05 NOTE — Progress Notes (Signed)
PROGRESS NOTE  EAGLE KILDUFF ASN:[id number] DOB: Jul 18, 1955   PCP: [name], NP  Patient is from: Home.  Lives with his wife.  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 Truett Perna admitted to the hospital from oncology office for low back pain evaluation and treatment.  He also had about 30 pounds unintentional weight loss in several months.  MRI lumbar spine ordered and pending.   Subjective: Seen and examined earlier this morning.  No major events overnight of this morning.  Reports improvement in his pain after pain medication this morning.  He currently rates his pain 6/10.  He denies radiculopathy, fever, bowel or bladder habit change.  No history of trauma or fall.  Objective: Vitals:   05/04/21 2201 05/04/21 2228 05/05/21 0056 05/05/21 0500  BP: 92/65 105/67 99/64 108/71  Pulse: 84 82 83 69  Resp: 18  18 18   Temp: 97.7 F (36.5 C)  97.6 F (36.4 C) 97.7 F (36.5 C)  TempSrc: Oral  Oral Oral  SpO2: 95% 94% 95% 97%    Examination:  GENERAL: No apparent distress.  Nontoxic. HEENT: MMM.  Vision and hearing grossly intact.  NECK: Supple.  No apparent JVD.  RESP: 97% on RA.  No IWOB.  Fair aeration bilaterally. CVS:  RRR. Heart sounds normal.  ABD/GI/GU: BS+. Abd soft, NTND.  MSK/EXT:  Moves extremities. No apparent deformity. No edema.  SKIN: no apparent skin lesion or wound NEURO: Awake, alert and oriented appropriately.  No apparent focal neuro deficit. PSYCH: Calm. Normal affect.   Procedures:  None  Microbiology summarized: COVID-19 and influenza PCR nonreactive.  Assessment and Plan: * Low back pain Reportedly had excruciating lower back pain that has improved with pain medication.  Currently rates his pain 6/10  after pain medication. Saw Dr. Drucilla Chalet and had MRI done which showed T11/T12 compression fracture.  He also reports about 30 pounds unintentional weight loss over the last several months.  Reportedly normal colonoscopy about 10 years ago.  Patient is a smoker.  No fever or bowel or bladder habit change.  Denies radiculopathy.  Fair strength is in BLE.  Chronic leukocytosis likely from splenectomy. -Pain control -Follow MRI lumbar spine -Ordered IV Ativan 2 mg as needed for MRI.  Reports claustrophobia. -PT/OT eval  Unintentional weight loss Patient reports about 30 pounds unintentional weight loss in several months.  Reportedly normal colonoscopy 10 years ago.  He is due for repeat.  He denies melena or hematochezia.  No respiratory status change other than his chronic COPD. -May consider CT chest/abdomen/pelvis to exclude malignancy once renal function improves.  Leukocytosis Chronic likely from his splenectomy.  Followed by Dr. Truett Perna.  Improved. -Continue monitoring  AKI (acute kidney injury) (HCC) Recent Labs    08/10/20 0858 08/17/20 1228 08/18/20 0458 05/04/21 1100 05/05/21 0515  BUN 13 9 11 10 19   CREATININE 0.78 0.86 0.92 0.94 1.49*  Prerenal?  Looks dehydrated on presentation with some degree of hemoconcentration.  He is also on lisinopril and HCTZ. -Discontinue lisinopril/HCTZ -Consider further work-up if no improvement. -Gentle IV fluid over the next 24 hours   HLD (hyperlipidemia) Resume home statin.  COPD (chronic obstructive pulmonary disease) (HCC) Stable. -Bronchodilators as needed  Tobacco use Encouraged tobacco cessation. -Nicotine patch as needed  Benign  essential HTN Normotensive. -Discontinue lisinopril/HCTZ in the setting of AKI -Start amlodipine -Change IV hydralazine to p.o. as needed  DM2 (diabetes mellitus, type 2) (HCC) Controlled.  A1c 5.9%. Recent Labs  Lab 05/04/21 1800 05/04/21 2152 05/05/21 0740 05/05/21 1144  GLUCAP 95 107* 75  93  -May consider discontinuing CBG monitoring and SSI if CBG remains within reasonable range.          There is no height or weight on file to calculate BMI.         DVT prophylaxis:  heparin injection 5,000 Units Start: 05/04/21 2200  Code Status: Full code Family Communication: Patient and/or RN. Available if any question.  Level of care: Med-Surg Status is: Observation The patient will require care spanning > 2 midnights and should be moved to inpatient because: Evaluation and treatment of low back pain     Final disposition: TBD    Consultants:  Oncology   Sch Meds:  Scheduled Meds:  amLODipine  10 mg Oral Daily   aspirin EC  81 mg Oral Daily   atorvastatin  10 mg Oral Daily   docusate sodium  100 mg Oral BID   heparin  5,000 Units Subcutaneous Q8H   insulin aspart  0-5 Units Subcutaneous QHS   insulin aspart  0-9 Units Subcutaneous TID WC   Continuous Infusions:  sodium chloride 75 mL/hr at 05/05/21 0447   methocarbamol (ROBAXIN) IV     PRN Meds:.acetaminophen, guaiFENesin, hydrALAZINE, ipratropium-albuterol, LORazepam, methocarbamol (ROBAXIN) IV, metoprolol tartrate, morphine injection, ondansetron **OR** ondansetron (ZOFRAN) IV, oxyCODONE, senna-docusate, traZODone  Antimicrobials: Anti-infectives (From admission, onward)    None        I have personally reviewed the following labs and images: CBC: Recent Labs  Lab 05/04/21 1100 05/05/21 0515  WBC 15.8* 12.4*  NEUTROABS 10.3*  --   HGB 15.3 13.2  HCT 47.4 41.1  MCV 88.8 90.7  PLT 428* 382   BMP &GFR Recent Labs  Lab 05/04/21 1100 05/05/21 0515  NA 137 132*  K 3.7 3.5  CL 95* 96*  CO2 32 30  GLUCOSE 121* 91  BUN 10 19  CREATININE 0.94 1.49*  CALCIUM 10.4* 8.6*  MG  --  1.8   Estimated Creatinine Clearance: 47.1 mL/min (A) (by C-G formula based on SCr of 1.49 mg/dL (H)). Liver & Pancreas: Recent Labs  Lab 05/04/21 1100 05/05/21 0515  AST 24 19  ALT 28 21  ALKPHOS  142* 108  BILITOT 0.8 0.6  PROT 8.3* 7.1  ALBUMIN 3.8 3.0*   No results for input(s): LIPASE, AMYLASE in the last 168 hours. No results for input(s): AMMONIA in the last 168 hours. Diabetic: Recent Labs    05/04/21 1724  HGBA1C 5.9*   Recent Labs  Lab 05/04/21 1800 05/04/21 2152 05/05/21 0740 05/05/21 1144  GLUCAP 95 107* 75 93   Cardiac Enzymes: No results for input(s): CKTOTAL, CKMB, CKMBINDEX, TROPONINI in the last 168 hours. No results for input(s): PROBNP in the last 8760 hours. Coagulation Profile: No results for input(s): INR, PROTIME in the last 168 hours. Thyroid Function Tests: Recent Labs    05/04/21 1724  TSH 2.286   Lipid Profile: No results for input(s): CHOL, HDL, LDLCALC, TRIG, CHOLHDL, LDLDIRECT in the last 72 hours. Anemia Panel: No results for input(s): VITAMINB12, FOLATE, FERRITIN, TIBC, IRON, RETICCTPCT in the last 72 hours. Urine analysis: No results found for: COLORURINE, APPEARANCEUR, LABSPEC, PHURINE, GLUCOSEU, HGBUR, BILIRUBINUR, KETONESUR, PROTEINUR, UROBILINOGEN, NITRITE, LEUKOCYTESUR Sepsis Labs: Invalid input(s): PROCALCITONIN, LACTICIDVEN  Microbiology: Recent Results (from the past 240 hour(s))  Resp Panel by RT-PCR (Flu A&B, Covid) Nasopharyngeal Swab     Status: None   Collection Time: 05/04/21  5:01 PM   Specimen: Nasopharyngeal Swab; Nasopharyngeal(NP) swabs in vial transport medium  Result Value Ref Range Status   SARS Coronavirus 2 by RT PCR NEGATIVE NEGATIVE Final    Comment: (NOTE) SARS-CoV-2 target nucleic acids are NOT DETECTED.  The SARS-CoV-2 RNA is generally detectable in upper respiratory specimens during the acute phase of infection. The lowest concentration of SARS-CoV-2 viral copies this assay can detect is 138 copies/mL. A negative result does not preclude SARS-Cov-2 infection and should not be used as the sole basis for treatment or other patient management decisions. A negative result may occur with  improper  specimen collection/handling, submission of specimen other than nasopharyngeal swab, presence of viral mutation(s) within the areas targeted by this assay, and inadequate number of viral copies(<138 copies/mL). A negative result must be combined with clinical observations, patient history, and epidemiological information. The expected result is Negative.  Fact Sheet for Patients:  BloggerCourse.com  Fact Sheet for Healthcare Providers:  SeriousBroker.it  This test is no t yet approved or cleared by the [place] FDA and  has been authorized for detection and/or diagnosis of SARS-CoV-2 by FDA under an Emergency Use Authorization (EUA). This EUA will remain  in effect (meaning this test can be used) for the duration of the COVID-19 declaration under Section 564(b)(1) of the Act, 21 U.S.C.section 360bbb-3(b)(1), unless the authorization is terminated  or revoked sooner.       Influenza A by PCR NEGATIVE NEGATIVE Final   Influenza B by PCR NEGATIVE NEGATIVE Final    Comment: (NOTE) The Xpert Xpress SARS-CoV-2/FLU/RSV plus assay is intended as an aid in the diagnosis of influenza from Nasopharyngeal swab specimens and should not be used as a sole basis for treatment. Nasal washings and aspirates are unacceptable for Xpert Xpress SARS-CoV-2/FLU/RSV testing.  Fact Sheet for Patients: BloggerCourse.com  Fact Sheet for Healthcare Providers: SeriousBroker.it  This test is not yet approved or cleared by the [place] FDA and has been authorized for detection and/or diagnosis of SARS-CoV-2 by FDA under an Emergency Use Authorization (EUA). This EUA will remain in effect (meaning this test can be used) for the duration of the COVID-19 declaration under Section 564(b)(1) of the Act, 21 U.S.C. section 360bbb-3(b)(1), unless the authorization is terminated or revoked.  Performed at  [facility], [address] [place], [place] [address]     Radiology Studies: No results found.    Khalilah Hoke T. Paxson Harrower [facility]  If 7PM-7AM, please contact night-coverage [email or web address] 05/05/2021, 1:42 PM

## 2021-05-05 NOTE — Hospital Course (Addendum)
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 Truett Perna admitted to the hospital from oncology office for low back pain evaluation and treatment.  He also had about 30 pounds unintentional weight loss in several months. ? ?MRI lumbar spine with T11 and T12 compression fracture with at least 50% height loss in both.  IR kyphoplasty pending insurance approval. ?

## 2021-05-06 ENCOUNTER — Encounter (HOSPITAL_COMMUNITY): Payer: Self-pay | Admitting: Student

## 2021-05-06 DIAGNOSIS — M545 Low back pain, unspecified: Secondary | ICD-10-CM | POA: Diagnosis not present

## 2021-05-06 DIAGNOSIS — J449 Chronic obstructive pulmonary disease, unspecified: Secondary | ICD-10-CM | POA: Diagnosis not present

## 2021-05-06 DIAGNOSIS — N179 Acute kidney failure, unspecified: Secondary | ICD-10-CM | POA: Diagnosis not present

## 2021-05-06 DIAGNOSIS — E669 Obesity, unspecified: Secondary | ICD-10-CM

## 2021-05-06 DIAGNOSIS — I10 Essential (primary) hypertension: Secondary | ICD-10-CM | POA: Diagnosis not present

## 2021-05-06 LAB — CBC
HCT: 45.3 % (ref 39.0–52.0)
Hemoglobin: 14.2 g/dL (ref 13.0–17.0)
MCH: 28.8 pg (ref 26.0–34.0)
MCHC: 31.3 g/dL (ref 30.0–36.0)
MCV: 91.9 fL (ref 80.0–100.0)
Platelets: 394 10*3/uL (ref 150–400)
RBC: 4.93 MIL/uL (ref 4.22–5.81)
RDW: 15.5 % (ref 11.5–15.5)
WBC: 11.6 10*3/uL — ABNORMAL HIGH (ref 4.0–10.5)
nRBC: 0 % (ref 0.0–0.2)

## 2021-05-06 LAB — RENAL FUNCTION PANEL
Albumin: 3.1 g/dL — ABNORMAL LOW (ref 3.5–5.0)
Anion gap: 7 (ref 5–15)
BUN: 15 mg/dL (ref 8–23)
CO2: 30 mmol/L (ref 22–32)
Calcium: 8.9 mg/dL (ref 8.9–10.3)
Chloride: 100 mmol/L (ref 98–111)
Creatinine, Ser: 1.01 mg/dL (ref 0.61–1.24)
GFR, Estimated: >60 mL/min (ref >60)
Glucose, Bld: 100 mg/dL — ABNORMAL HIGH (ref 70–99)
Phosphorus: 3.8 mg/dL (ref 2.5–4.6)
Potassium: 3.6 mmol/L (ref 3.5–5.1)
Sodium: 137 mmol/L (ref 135–145)

## 2021-05-06 LAB — GLUCOSE, CAPILLARY
Glucose-Capillary: 106 mg/dL — ABNORMAL HIGH (ref 70–99)
Glucose-Capillary: 82 mg/dL (ref 70–99)
Glucose-Capillary: 83 mg/dL (ref 70–99)
Glucose-Capillary: 98 mg/dL (ref 70–99)

## 2021-05-06 LAB — PROTIME-INR
INR: 1 (ref 0.8–1.2)
Prothrombin Time: 13.1 seconds (ref 11.4–15.2)

## 2021-05-06 LAB — MAGNESIUM: Magnesium: 1.8 mg/dL (ref 1.7–2.4)

## 2021-05-06 LAB — CK: Total CK: 79 U/L (ref 49–397)

## 2021-05-06 MED ORDER — SENNOSIDES-DOCUSATE SODIUM 8.6-50 MG PO TABS
1.0000 | ORAL_TABLET | Freq: Two times a day (BID) | ORAL | Status: DC | PRN
Start: 2021-05-06 — End: 2021-05-08
  Administered 2021-05-08: 1 via ORAL
  Filled 2021-05-06: qty 1

## 2021-05-06 MED ORDER — BUDESONIDE 0.5 MG/2ML IN SUSP
0.5000 mg | Freq: Two times a day (BID) | RESPIRATORY_TRACT | Status: DC
  Administered 2021-05-06 – 2021-05-13 (×14): 0.5 mg via RESPIRATORY_TRACT
  Filled 2021-05-06 (×14): qty 2

## 2021-05-06 MED ORDER — UMECLIDINIUM-VILANTEROL 62.5-25 MCG/ACT IN AEPB
1.0000 | INHALATION_SPRAY | Freq: Every day | RESPIRATORY_TRACT | Status: DC
  Administered 2021-05-06 – 2021-05-13 (×8): 1 via RESPIRATORY_TRACT
  Filled 2021-05-06: qty 14

## 2021-05-06 MED ORDER — POLYETHYLENE GLYCOL 3350 17 G PO PACK
17.0000 g | PACK | Freq: Two times a day (BID) | ORAL | Status: DC | PRN
  Administered 2021-05-08 – 2021-05-11 (×2): 17 g via ORAL
  Filled 2021-05-06 (×2): qty 1

## 2021-05-06 NOTE — Progress Notes (Signed)
IR consulted for possible T11 / T12 kyphoplasty. Case has been reviewed and procedure approved by Dr. Elby Showers. Insurance approval for Kyphoplasty pending. IR will continue to follow and plan to proceed once insurance approves procedure. ?  ?Patient in on 81 mg of ASA and subcutaneous prophylactic dose of heparin. Both would need to be held accordingly.  ?  ?Please call with questions or concerns. ? ?

## 2021-05-06 NOTE — Discharge Instructions (Signed)
PER MEDICAL ONCOLOGY: Follow up with Dr. Truett Perna in 1 month. Office will call with appointment. Call [phone] for any questions or concerns. ?

## 2021-05-06 NOTE — TOC Initial Note (Signed)
Transition of Care (TOC) - Initial/Assessment Note  ? ? ?Patient Details  ?Name: Nathan Nicholson ?MRN: [id number] ?Date of Birth: December 08, 1955 ? ?Transition of Care (TOC) CM/SW Contact:    ?Cecille Po, RN ?Phone Number: ?05/06/2021, 10:58 AM ? ?Clinical Narrative:                 ? ? ?Transition of Care (TOC) Screening Note ? ? ?Patient Details  ?Name: Nathan Nicholson ?Date of Birth: December 24, 1955 ? ? ?Transition of Care (TOC) CM/SW Contact:    ?Cecille Po, RN ?Phone Number: ?05/06/2021, 10:58 AM ? ? ? ?Transition of Care Department [facility]) has reviewed patient and no TOC needs have been identified at this time. We will continue to monitor patient advancement through interdisciplinary progression rounds. If new patient transition needs arise, please place a TOC consult. ?  ? ? ? ?Expected Discharge Plan: Home/Self Care ?Barriers to Discharge: Continued Medical Work up ? ? ?Expected Discharge Plan and Services ?Expected Discharge Plan: Home/Self Care ?  ?  ?   ? ?Prior Living Arrangements/Services ?  ?Lives with:: Spouse ?Patient language and need for interpreter reviewed:: Yes ?Do you feel safe going back to the place where you live?: Yes      ?Need for Family Participation in Patient Care: Yes (Comment) ?Care giver support system in place?: Yes (comment) ?  ?Criminal Activity/Legal Involvement Pertinent to Current Situation/Hospitalization: No - Comment as needed ? ?Activities of Daily Living ?Home Assistive Devices/Equipment: Oxygen, Walker (specify type), Cane (specify quad or straight) ?ADL Screening (condition at time of admission) ?Patient's cognitive ability adequate to safely complete daily activities?: Yes ?Is the patient deaf or have difficulty hearing?: No ?Does the patient have difficulty seeing, even when wearing glasses/contacts?: No ?Does the patient have difficulty concentrating, remembering, or making decisions?: No ?Patient able to express need for assistance with ADLs?: Yes ?Does the patient  have difficulty dressing or bathing?: Yes ?Independently performs ADLs?: No ?Communication: Independent ?Dressing (OT): Needs assistance ?Is this a change from baseline?: Pre-admission baseline ?Grooming: Independent ?Feeding: Independent ?Bathing: Needs assistance ?Is this a change from baseline?: Pre-admission baseline ?Toileting: Needs assistance ?Is this a change from baseline?: Pre-admission baseline ?In/Out Bed: Needs assistance ?Is this a change from baseline?: Pre-admission baseline ?Walks in Home: Needs assistance ?Is this a change from baseline?: Pre-admission baseline ?Does the patient have difficulty walking or climbing stairs?: Yes ?Weakness of Legs: Both ?Weakness of Arms/Hands: None ? ?  ?Orientation: : Oriented to Self, Oriented to Place, Oriented to  Time, Oriented to Situation ?Alcohol / Substance Use: Never Used ?Psych Involvement: No (comment) ? ?Admission diagnosis:  Low back pain [M54.50] ?Lower back pain [M54.50] ?Patient Active Problem List  ? Diagnosis Date Noted  ? Unintentional weight loss 05/05/2021  ? AKI (acute kidney injury) (HCC) 05/05/2021  ? Lower back pain 05/05/2021  ? DM2 (diabetes mellitus, type 2) (HCC) 05/04/2021  ? Leukocytosis 05/04/2021  ? Benign essential HTN 05/04/2021  ? Tobacco use 05/04/2021  ? Low back pain 05/04/2021  ? COPD (chronic obstructive pulmonary disease) (HCC) 05/04/2021  ? HLD (hyperlipidemia) 05/04/2021  ? 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 S/P right total hip arthroplasty 08/17/2020  ? ?PCP:  [name], NP ?Pharmacy:   ?[id number] - [place], [place] - [address] ?[address] ?[place] [address] ?Phone: [phone] Fax: [phone] ? ? ? ? ?Readmission Risk Interventions ?No flowsheet data found. ? ? ?

## 2021-05-06 NOTE — Assessment & Plan Note (Signed)
BMI 34.97 from previous weight.  Patient also reports unintentional weight loss. ?-Need current weight. ?

## 2021-05-06 NOTE — Progress Notes (Signed)
PROGRESS NOTE  Nathan Nicholson [id number] DOB: 1955-08-20   PCP: Earlyne Iba, NP  Patient is from: Home.  Lives with his wife.  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 Benay Spice admitted to the hospital from oncology office for low back pain evaluation and treatment.  He also had about 30 pounds unintentional weight loss in several months.  MRI lumbar spine with T11 and T12 compression fracture with at least 50% height loss in both.  IR kyphoplasty pending insurance approval.   Subjective: Seen and examined earlier this morning.  Reports 5/10 pain in his back.  No radiculopathy.  No bowel or bladder habit change.  He also reports shortness of breath and cough.  Denies chest pain.  He thinks he needs a breathing treatment.  Objective: Vitals:   05/05/21 1546 05/05/21 2031 05/06/21 0424 05/06/21 1040  BP: 104/69 102/63 136/83   Pulse: 84 91 82   Resp: 17 18 18    Temp: 97.7 F (36.5 C) 98.4 F (36.9 C) 97.7 F (36.5 C)   TempSrc: Oral Oral Oral   SpO2: 93% 93% 98% 97%    Examination:  GENERAL: No apparent distress.  Nontoxic. HEENT: MMM.  Vision and hearing grossly intact.  NECK: Supple.  No apparent JVD.  RESP:  No IWOB.  Fair aeration bilaterally.  Rhonchi and expiratory wheeze on exam. CVS:  RRR. Heart sounds normal.  ABD/GI/GU: BS+. Abd soft, NTND.  MSK/EXT:  Moves extremities. No apparent deformity. No edema.  SKIN: no apparent skin lesion or wound NEURO: Awake and alert. Oriented appropriately.  No apparent focal neuro deficit. PSYCH: Calm. Normal affect.   Procedures:  None  Microbiology summarized: [id number] and influenza PCR nonreactive.  Assessment and Plan: * Low back pain Likely due to T11 and T12 compression  fractures with 50% height loss as noted on MRI.  He has no radiculopathy, fever, bowel or bladder issues.  However, he reports about 30 pounds unintentional weight loss in several months.  No significant finding on last colonoscopy but about 10 years ago. -IR kyphoplasty pending insurance approval. -Pain control and bowel regimen -Continue PT/OT-no need identified.  Unintentional weight loss Patient reports about 30 pounds unintentional weight loss in several months.  Reportedly normal colonoscopy 10 years ago.  He is due for repeat.  He denies melena or hematochezia.  No respiratory status change other than his chronic COPD. -Defer further malignancy work-up to oncology -He says he is due for his routine colonoscopy.  Leukocytosis Chronic likely from his splenectomy.  Followed by Dr. Benay Spice.  Improved. -Continue monitoring  AKI (acute kidney injury) ([place]) Recent Labs    08/10/20 0858 08/17/20 1228 08/18/20 0458 05/04/21 1100 05/05/21 0515 05/06/21 0503  BUN 13 9 11 10 19 15   CREATININE 0.78 0.86 0.92 0.94 1.49* 1.01  Likely from dehydration, lisinopril and HCTZ. -Continue holding lisinopril and HCTZ. -Discontinue IV fluid -Recheck in the morning   HLD (hyperlipidemia) Continue home statin  COPD (chronic obstructive pulmonary disease) (HCC) A little short of breath today.  Has some rhonchi and wheeze -Added LAMA/LABA -Add Pulmicort -Discontinued IV fluid -Continue DuoNeb as needed  Tobacco use Encouraged tobacco cessation. -Nicotine patch as needed  Benign essential HTN Normotensive -Continue amlodipine -Continue holding lisinopril and HCTZ -P.o. hydralazine as needed  DM2 (diabetes mellitus, type 2) ([place]) Controlled.  A1c 5.9%. Recent Labs  Lab 05/05/21 1144 05/05/21 1742 05/05/21 2206 05/06/21 0750 05/06/21 1207  GLUCAP 93 105* 109* 106* 82  -CBG within normal range.  Did not require insulin.  Discontinue CBG monitoring and SSI.       There is  no height or weight on file to calculate BMI.         DVT prophylaxis:  heparin injection 5,000 Units Start: 05/04/21 2200  Code Status: Full code Family Communication: Patient and/or RN. Available if any question.  Level of care: Med-Surg Status is: Inpatient The patient will remain inpatient because: Need for IR kyphoplasty for T11 and T12 compression fracture with significant height loss and significant back pain     Final disposition: Home    Consultants:  Oncology IR   Sch Meds:  Scheduled Meds:  amLODipine  10 mg Oral Daily   aspirin EC  81 mg Oral Daily   atorvastatin  10 mg Oral Daily   budesonide  0.5 mg Nebulization BID   docusate sodium  100 mg Oral BID   heparin  5,000 Units Subcutaneous Q8H   insulin aspart  0-5 Units Subcutaneous QHS   insulin aspart  0-9 Units Subcutaneous TID WC   umeclidinium-vilanterol  1 puff Inhalation Daily   Continuous Infusions:  methocarbamol (ROBAXIN) IV     PRN Meds:.acetaminophen, guaiFENesin, hydrALAZINE, ipratropium-albuterol, methocarbamol (ROBAXIN) IV, metoprolol tartrate, morphine injection, ondansetron **OR** ondansetron (ZOFRAN) IV, oxyCODONE, polyethylene glycol, senna-docusate, traZODone  Antimicrobials: Anti-infectives (From admission, onward)    None        I have personally reviewed the following labs and images: CBC: Recent Labs  Lab 05/04/21 1100 05/05/21 0515 05/06/21 0503  WBC 15.8* 12.4* 11.6*  NEUTROABS 10.3*  --   --   HGB 15.3 13.2 14.2  HCT 47.4 41.1 45.3  MCV 88.8 90.7 91.9  PLT 428* 382 394   BMP &GFR Recent Labs  Lab 05/04/21 1100 05/05/21 0515 05/06/21 0503  NA 137 132* 137  K 3.7 3.5 3.6  CL 95* 96* 100  CO2 32 30 30  GLUCOSE 121* 91 100*  BUN 10 19 15   CREATININE 0.94 1.49* 1.01  CALCIUM 10.4* 8.6* 8.9  MG  --  1.8 1.8  PHOS  --   --  3.8   Estimated Creatinine Clearance: 69.5 mL/min (by C-G formula based on SCr of 1.01 mg/dL). Liver & Pancreas: Recent Labs  Lab  05/04/21 1100 05/05/21 0515 05/06/21 0503  AST 24 19  --   ALT 28 21  --   ALKPHOS 142* 108  --   BILITOT 0.8 0.6  --   PROT 8.3* 7.1  --   ALBUMIN 3.8 3.0* 3.1*   No results for input(s): LIPASE, AMYLASE in the last 168 hours. No results for input(s): AMMONIA in the last 168 hours. Diabetic: Recent Labs    05/04/21 1724  HGBA1C 5.9*   Recent Labs  Lab 05/05/21 1144 05/05/21 1742 05/05/21 2206 05/06/21 0750 05/06/21 1207  GLUCAP 93 105* 109* 106* 82   Cardiac Enzymes: Recent Labs  Lab 05/06/21 0503  CKTOTAL 79   No results for input(s): PROBNP in the last 8760 hours. Coagulation Profile: Recent Labs  Lab 05/06/21 0503  INR 1.0   Thyroid Function Tests: Recent Labs    05/04/21 1724  TSH 2.286   Lipid Profile: No results for input(s): CHOL, HDL, LDLCALC, TRIG, CHOLHDL, LDLDIRECT in the last 72 hours. Anemia Panel:  No results for input(s): VITAMINB12, FOLATE, FERRITIN, TIBC, IRON, RETICCTPCT in the last 72 hours. Urine analysis: No results found for: COLORURINE, APPEARANCEUR, LABSPEC, PHURINE, GLUCOSEU, HGBUR, BILIRUBINUR, KETONESUR, PROTEINUR, UROBILINOGEN, NITRITE, LEUKOCYTESUR Sepsis Labs: Invalid input(s): PROCALCITONIN, [place]  Microbiology: Recent Results (from the past 240 hour(s))  Resp Panel by RT-PCR (Flu A&B, Covid) Nasopharyngeal Swab     Status: None   Collection Time: 05/04/21  5:01 PM   Specimen: Nasopharyngeal Swab; Nasopharyngeal(NP) swabs in vial transport medium  Result Value Ref Range Status   SARS Coronavirus 2 by RT PCR NEGATIVE NEGATIVE Final    Comment: (NOTE) SARS-CoV-2 target nucleic acids are NOT DETECTED.  The SARS-CoV-2 RNA is generally detectable in upper respiratory specimens during the acute phase of infection. The lowest concentration of SARS-CoV-2 viral copies this assay can detect is 138 copies/mL. A negative result does not preclude SARS-Cov-2 infection and should not be used as the sole basis for treatment  or other patient management decisions. A negative result may occur with  improper specimen collection/handling, submission of specimen other than nasopharyngeal swab, presence of viral mutation(s) within the areas targeted by this assay, and inadequate number of viral copies(<138 copies/mL). A negative result must be combined with clinical observations, patient history, and epidemiological information. The expected result is Negative.  Fact Sheet for Patients:  EntrepreneurPulse.com.au  Fact Sheet for Healthcare Providers:  IncredibleEmployment.be  This test is no t yet approved or cleared by the [place] FDA and  has been authorized for detection and/or diagnosis of SARS-CoV-2 by FDA under an Emergency Use Authorization (EUA). This EUA will remain  in effect (meaning this test can be used) for the duration of the COVID-19 declaration under Section 564(b)(1) of the Act, 21 U.S.C.section 360bbb-3(b)(1), unless the authorization is terminated  or revoked sooner.       Influenza A by PCR NEGATIVE NEGATIVE Final   Influenza B by PCR NEGATIVE NEGATIVE Final    Comment: (NOTE) The Xpert Xpress SARS-CoV-2/FLU/RSV plus assay is intended as an aid in the diagnosis of influenza from Nasopharyngeal swab specimens and should not be used as a sole basis for treatment. Nasal washings and aspirates are unacceptable for Xpert Xpress SARS-CoV-2/FLU/RSV testing.  Fact Sheet for Patients: EntrepreneurPulse.com.au  Fact Sheet for Healthcare Providers: IncredibleEmployment.be  This test is not yet approved or cleared by the [place] FDA and has been authorized for detection and/or diagnosis of SARS-CoV-2 by FDA under an Emergency Use Authorization (EUA). This EUA will remain in effect (meaning this test can be used) for the duration of the COVID-19 declaration under Section 564(b)(1) of the Act, 21 U.S.C. section  360bbb-3(b)(1), unless the authorization is terminated or revoked.  Performed at [facility], [place] [address], [place], [place] [address]     Radiology Studies: MR Lumbar Spine W Wo Contrast  Result Date: 05/05/2021 CLINICAL DATA:  Compression fracture EXAM: MRI LUMBAR SPINE WITHOUT AND WITH CONTRAST TECHNIQUE: Multiplanar and multiecho pulse sequences of the lumbar spine were obtained without and with intravenous contrast. CONTRAST:  92mL GADAVIST GADOBUTROL 1 MMOL/ML IV SOLN COMPARISON:  None. FINDINGS: Segmentation:  Standard. Alignment:  Physiologic. Vertebrae: There are compression fractures of T11 and T12. T11 is incompletely visualized. There is approximately 50% height loss at both levels. There is 6 mm of retropulsion at T12. The intervening disc space is normal. Conus medullaris and cauda equina: Conus extends to the T12-L1 level. Conus and cauda equina appear normal. Paraspinal and other soft tissues: Negative Disc levels:  T12-L1: Unremarkable. L1-L2: Normal disc space and facet joints. No spinal canal stenosis. No neural foraminal stenosis. L2-L3: Normal disc space and facet joints. No spinal canal stenosis. No neural foraminal stenosis. L3-L4: Normal disc space and facet joints. No spinal canal stenosis. No neural foraminal stenosis. L4-L5: Left asymmetric disc bulge with small annular fissure in close proximity to the exiting left L4 nerve root. No spinal canal stenosis. No neural foraminal stenosis. L5-S1: Normal disc space and facet joints. No spinal canal stenosis. No neural foraminal stenosis. Visualized sacrum: Normal. IMPRESSION: 1. Acute or subacute compression fractures of T11 and T12 with approximately 50% height loss at both levels. 6 mm of retropulsion at T12 without associated stenosis. 2. L4-L5 left asymmetric disc bulge with small annular fissure in close proximity to the exiting left L4 nerve root. 3. Otherwise normal lumbar spine. Electronically Signed   By:  Ulyses Jarred M.D.   On: 05/05/2021 20:02      Erianna Jolly T. [place]  If 7PM-7AM, please contact night-coverage [email or web address] 05/06/2021, 3:03 PM

## 2021-05-06 NOTE — Discharge Planning (Signed)
Oncology Discharge Planning Note ? ?[facility] ?Address: [address] [place], [place],  [address] ?Hours of Operation:  8am - 5pm, Monday - Friday  ?Clinic Contact Information:  [phone] ? ?Oncology Care Team: ?Medical Oncologist:  Dr. Betsy Coder ? ?Patient Details: ?Name:  Nathan Nicholson, Nathan Nicholson ?MRN:   [id number] ?DOB:   1955-11-30 ?Reason for Current Admission: Low back pain ? ?Discharge Planning Narrative: ?Dr. Benay Spice admitted patient from office for severe back pain. Discharge follow-up appointments for oncology are being scheduled and will be available on the AVS and MyChart.   ?Upon discharge from the hospital, hematology/oncology's post discharge plan of care for the outpatient setting is: Follow up with hematology in 1 month.  ? ?Johandry Longden will be called within two business days after discharge to review hematology/oncology's plan of care for full understanding.   ? ?Outpatient Oncology Specific Care Only: ?Oncology appointment transportation needs addressed?:  no ?Oncology medication management for symptom management addressed?:  not applicable ?Chemo Alert Card reviewed?:  not applicable ?Immunotherapy Alert Card reviewed?:  not applicable ?

## 2021-05-07 DIAGNOSIS — M545 Low back pain, unspecified: Secondary | ICD-10-CM | POA: Diagnosis not present

## 2021-05-07 DIAGNOSIS — I10 Essential (primary) hypertension: Secondary | ICD-10-CM | POA: Diagnosis not present

## 2021-05-07 DIAGNOSIS — E871 Hypo-osmolality and hyponatremia: Secondary | ICD-10-CM

## 2021-05-07 DIAGNOSIS — E876 Hypokalemia: Secondary | ICD-10-CM

## 2021-05-07 DIAGNOSIS — J449 Chronic obstructive pulmonary disease, unspecified: Secondary | ICD-10-CM | POA: Diagnosis not present

## 2021-05-07 DIAGNOSIS — E669 Obesity, unspecified: Secondary | ICD-10-CM

## 2021-05-07 DIAGNOSIS — N179 Acute kidney failure, unspecified: Secondary | ICD-10-CM | POA: Diagnosis not present

## 2021-05-07 LAB — RENAL FUNCTION PANEL
Albumin: 2.9 g/dL — ABNORMAL LOW (ref 3.5–5.0)
Anion gap: 8 (ref 5–15)
BUN: 13 mg/dL (ref 8–23)
CO2: 28 mmol/L (ref 22–32)
Calcium: 8.8 mg/dL — ABNORMAL LOW (ref 8.9–10.3)
Chloride: 98 mmol/L (ref 98–111)
Creatinine, Ser: 0.87 mg/dL (ref 0.61–1.24)
GFR, Estimated: >60 mL/min (ref >60)
Glucose, Bld: 87 mg/dL (ref 70–99)
Phosphorus: 3.8 mg/dL (ref 2.5–4.6)
Potassium: 3.3 mmol/L — ABNORMAL LOW (ref 3.5–5.1)
Sodium: 134 mmol/L — ABNORMAL LOW (ref 135–145)

## 2021-05-07 LAB — CBC
HCT: 40.6 % (ref 39.0–52.0)
Hemoglobin: 12.9 g/dL — ABNORMAL LOW (ref 13.0–17.0)
MCH: 28.7 pg (ref 26.0–34.0)
MCHC: 31.8 g/dL (ref 30.0–36.0)
MCV: 90.4 fL (ref 80.0–100.0)
Platelets: 369 10*3/uL (ref 150–400)
RBC: 4.49 MIL/uL (ref 4.22–5.81)
RDW: 15 % (ref 11.5–15.5)
WBC: 13.9 10*3/uL — ABNORMAL HIGH (ref 4.0–10.5)
nRBC: 0 % (ref 0.0–0.2)

## 2021-05-07 LAB — GLUCOSE, CAPILLARY
Glucose-Capillary: 88 mg/dL (ref 70–99)
Glucose-Capillary: 107 mg/dL — ABNORMAL HIGH (ref 70–99)
Glucose-Capillary: 107 mg/dL — ABNORMAL HIGH (ref 70–99)

## 2021-05-07 LAB — MAGNESIUM: Magnesium: 1.5 mg/dL — ABNORMAL LOW (ref 1.7–2.4)

## 2021-05-07 MED ORDER — MAGNESIUM SULFATE 2 GM/50ML IV SOLN
2.0000 g | Freq: Once | INTRAVENOUS | Status: AC
  Administered 2021-05-07: 2 g via INTRAVENOUS
  Filled 2021-05-07: qty 50

## 2021-05-07 MED ORDER — POTASSIUM CHLORIDE CRYS ER 20 MEQ PO TBCR
40.0000 meq | EXTENDED_RELEASE_TABLET | ORAL | Status: AC
  Administered 2021-05-07 (×2): 40 meq via ORAL
  Filled 2021-05-07 (×2): qty 2

## 2021-05-07 NOTE — Progress Notes (Signed)
PROGRESS NOTE  Nathan Nicholson [id number] DOB: 06/23/1955   PCP: Earlyne Iba, NP  Patient is from: Home.  Lives with his wife.  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 Benay Spice admitted to the hospital from oncology office for low back pain evaluation and treatment.  He also had about 30 pounds unintentional weight loss in several months.  MRI lumbar spine with T11 and T12 compression fracture with at least 50% height loss in both.  IR kyphoplasty pending insurance approval.   Subjective: Seen and examined earlier this morning.  No major events overnight of this morning.  Reports improvement in his breathing.  Continues to endorse 5/10 back pain.  No radicular symptoms.  No bowel or bladder issue.  Patient's wife at bedside.  Objective: Vitals:   05/07/21 0740 05/07/21 0931 05/07/21 1148 05/07/21 1320  BP:  117/73 131/85 (!) 141/98  Pulse:   80 85  Resp:   17 17  Temp:   97.7 F (36.5 C) (!) 97.4 F (36.3 C)  TempSrc:   Oral Oral  SpO2: 94%  97% 97%  Weight:        Examination: GENERAL: No apparent distress.  Nontoxic. HEENT: MMM.  Vision and hearing grossly intact.  NECK: Supple.  No apparent JVD.  RESP:  No IWOB.  Fair aeration bilaterally.  Slightly rhonchorous. CVS:  RRR. Heart sounds normal.  ABD/GI/GU: BS+. Abd soft, NTND.  MSK/EXT:  Moves extremities. No apparent deformity. No edema.  SKIN: no apparent skin lesion or wound NEURO: Awake and alert. Oriented appropriately.  No apparent focal neuro deficit. PSYCH: Calm. Normal affect.   Procedures:  None  Microbiology summarized: [id number] and influenza PCR nonreactive.  Assessment and Plan: * Low back pain Likely due to T11 and T12 compression fractures with 50%  height loss as noted on MRI.  He has no radiculopathy, fever, bowel or bladder issues.  However, he reports about 30 pounds unintentional weight loss in several months.  No significant finding on last colonoscopy but about 10 years ago. -IR kyphoplasty pending insurance approval. -Pain control and bowel regimen -Continue PT/OT-no need identified.  Unintentional weight loss Patient reports about 30 pounds unintentional weight loss in several months.  Reportedly normal colonoscopy 10 years ago.  He is due for repeat.  He denies melena or hematochezia.  No respiratory status change other than his chronic COPD. -Defer further malignancy work-up to oncology -He says he is due for his routine colonoscopy.  Leukocytosis Chronic likely from his splenectomy.  Followed by Dr. Benay Spice.  -Continue monitoring  AKI (acute kidney injury) [facility]) Recent Labs    08/10/20 0858 08/17/20 1228 08/18/20 0458 05/04/21 1100 05/05/21 0515 05/06/21 0503 05/07/21 0545  BUN 13 9 11 10 19 15 13   CREATININE 0.78 0.86 0.92 0.94 1.49* 1.01 0.87  Likely from dehydration, lisinopril and HCTZ.  Resolved. -Continue holding lisinopril and HCTZ. -Check intermittently   COPD (chronic obstructive pulmonary disease) (HCC) Shortness of breath improved.  Still with some rhonchi but improved. -Added LAMA/LABA/Pulmicort on 3/3. -Continue DuoNeb as needed -Incentive spirometry/OOB/PT/OT  Benign essential HTN Normotensive -Continue amlodipine -Continue holding lisinopril and HCTZ -P.o. hydralazine as needed  Hyponatremia Recheck in the morning. Continue holding lisinopril and HCTZ.  Hypomagnesemia Mg 1.5. -IV magnesium sulfate 2 g  x 1  Hypokalemia K3.3.  Mg 1.5. -P.o. KCl 40x2 -IV magnesium sulfate 2 g x 1  Obesity (BMI 30-39.9) BMI 34.97 from previous weight.  Patient also reports unintentional weight loss. -Need current weight.  HLD (hyperlipidemia) Continue home statin  Tobacco use Encouraged  tobacco cessation. -Nicotine patch as needed  DM2 (diabetes mellitus, type 2) ([place]) Controlled.  A1c 5.9%. Recent Labs  Lab 05/06/21 1207 05/06/21 1637 05/06/21 2020 05/07/21 0737 05/07/21 1145  GLUCAP 82 83 98 88 107*  -CBG within normal range.  Did not require insulin.  Discontinue CBG monitoring and SSI.     DVT prophylaxis:  heparin injection 5,000 Units Start: 05/04/21 2200  Code Status: Full code Family Communication: Updated patient's wife at bedside. Level of care: Med-Surg Status is: Inpatient The patient will remain inpatient because: Need for IR kyphoplasty for T11 and T12 compression fracture with significant height loss and significant back pain     Final disposition: Home    Consultants:  Oncology IR   Sch Meds:  Scheduled Meds:  amLODipine  10 mg Oral Daily   aspirin EC  81 mg Oral Daily   atorvastatin  10 mg Oral Daily   budesonide  0.5 mg Nebulization BID   docusate sodium  100 mg Oral BID   heparin  5,000 Units Subcutaneous Q8H   umeclidinium-vilanterol  1 puff Inhalation Daily   Continuous Infusions:  methocarbamol (ROBAXIN) IV     PRN Meds:.acetaminophen, guaiFENesin, hydrALAZINE, ipratropium-albuterol, methocarbamol (ROBAXIN) IV, metoprolol tartrate, morphine injection, ondansetron **OR** ondansetron (ZOFRAN) IV, oxyCODONE, polyethylene glycol, senna-docusate, traZODone  Antimicrobials: Anti-infectives (From admission, onward)    None        I have personally reviewed the following labs and images: CBC: Recent Labs  Lab 05/04/21 1100 05/05/21 0515 05/06/21 0503 05/07/21 0545  WBC 15.8* 12.4* 11.6* 13.9*  NEUTROABS 10.3*  --   --   --   HGB 15.3 13.2 14.2 12.9*  HCT 47.4 41.1 45.3 40.6  MCV 88.8 90.7 91.9 90.4  PLT 428* 382 394 369   BMP &GFR Recent Labs  Lab 05/04/21 1100 05/05/21 0515 05/06/21 0503 05/07/21 0545  NA 137 132* 137 134*  K 3.7 3.5 3.6 3.3*  CL 95* 96* 100 98  CO2 32 30 30 28   GLUCOSE 121* 91  100* 87  BUN 10 19 15 13   CREATININE 0.94 1.49* 1.01 0.87  CALCIUM 10.4* 8.6* 8.9 8.8*  MG  --  1.8 1.8 1.5*  PHOS  --   --  3.8 3.8   Estimated Creatinine Clearance: 83.2 mL/min (by C-G formula based on SCr of 0.87 mg/dL). Liver & Pancreas: Recent Labs  Lab 05/04/21 1100 05/05/21 0515 05/06/21 0503 05/07/21 0545  AST 24 19  --   --   ALT 28 21  --   --   ALKPHOS 142* 108  --   --   BILITOT 0.8 0.6  --   --   PROT 8.3* 7.1  --   --   ALBUMIN 3.8 3.0* 3.1* 2.9*   No results for input(s): LIPASE, AMYLASE in the last 168 hours. No results for input(s): AMMONIA in the last 168 hours. Diabetic: Recent Labs    05/04/21 1724  HGBA1C 5.9*   Recent Labs  Lab 05/06/21 1207 05/06/21 1637 05/06/21 2020 05/07/21 0737 05/07/21 1145  GLUCAP 82 83 98 88 107*   Cardiac Enzymes: Recent Labs  Lab 05/06/21 0503  CKTOTAL 79   No results for input(s): PROBNP in  the last 8760 hours. Coagulation Profile: Recent Labs  Lab 05/06/21 0503  INR 1.0   Thyroid Function Tests: Recent Labs    05/04/21 1724  TSH 2.286   Lipid Profile: No results for input(s): CHOL, HDL, LDLCALC, TRIG, CHOLHDL, LDLDIRECT in the last 72 hours. Anemia Panel: No results for input(s): VITAMINB12, FOLATE, FERRITIN, TIBC, IRON, RETICCTPCT in the last 72 hours. Urine analysis: No results found for: COLORURINE, APPEARANCEUR, LABSPEC, PHURINE, GLUCOSEU, HGBUR, BILIRUBINUR, KETONESUR, PROTEINUR, UROBILINOGEN, NITRITE, LEUKOCYTESUR Sepsis Labs: Invalid input(s): PROCALCITONIN, [place]  Microbiology: Recent Results (from the past 240 hour(s))  Resp Panel by RT-PCR (Flu A&B, Covid) Nasopharyngeal Swab     Status: None   Collection Time: 05/04/21  5:01 PM   Specimen: Nasopharyngeal Swab; Nasopharyngeal(NP) swabs in vial transport medium  Result Value Ref Range Status   SARS Coronavirus 2 by RT PCR NEGATIVE NEGATIVE Final    Comment: (NOTE) SARS-CoV-2 target nucleic acids are NOT DETECTED.  The  SARS-CoV-2 RNA is generally detectable in upper respiratory specimens during the acute phase of infection. The lowest concentration of SARS-CoV-2 viral copies this assay can detect is 138 copies/mL. A negative result does not preclude SARS-Cov-2 infection and should not be used as the sole basis for treatment or other patient management decisions. A negative result may occur with  improper specimen collection/handling, submission of specimen other than nasopharyngeal swab, presence of viral mutation(s) within the areas targeted by this assay, and inadequate number of viral copies(<138 copies/mL). A negative result must be combined with clinical observations, patient history, and epidemiological information. The expected result is Negative.  Fact Sheet for Patients:  EntrepreneurPulse.com.au  Fact Sheet for Healthcare Providers:  IncredibleEmployment.be  This test is no t yet approved or cleared by the [place] FDA and  has been authorized for detection and/or diagnosis of SARS-CoV-2 by FDA under an Emergency Use Authorization (EUA). This EUA will remain  in effect (meaning this test can be used) for the duration of the COVID-19 declaration under Section 564(b)(1) of the Act, 21 U.S.C.section 360bbb-3(b)(1), unless the authorization is terminated  or revoked sooner.       Influenza A by PCR NEGATIVE NEGATIVE Final   Influenza B by PCR NEGATIVE NEGATIVE Final    Comment: (NOTE) The Xpert Xpress SARS-CoV-2/FLU/RSV plus assay is intended as an aid in the diagnosis of influenza from Nasopharyngeal swab specimens and should not be used as a sole basis for treatment. Nasal washings and aspirates are unacceptable for Xpert Xpress SARS-CoV-2/FLU/RSV testing.  Fact Sheet for Patients: EntrepreneurPulse.com.au  Fact Sheet for Healthcare Providers: IncredibleEmployment.be  This test is not yet approved or  cleared by the [place] FDA and has been authorized for detection and/or diagnosis of SARS-CoV-2 by FDA under an Emergency Use Authorization (EUA). This EUA will remain in effect (meaning this test can be used) for the duration of the COVID-19 declaration under Section 564(b)(1) of the Act, 21 U.S.C. section 360bbb-3(b)(1), unless the authorization is terminated or revoked.  Performed at [facility], [place] [address] [place], [place] [address]     Radiology Studies: No results found.    Charnelle Bergeman T. [place]  If 7PM-7AM, please contact night-coverage [email or web address] 05/07/2021, 2:14 PM

## 2021-05-07 NOTE — Assessment & Plan Note (Addendum)
Resolved

## 2021-05-07 NOTE — Assessment & Plan Note (Signed)
Recheck in the morning. ?Continue holding lisinopril and HCTZ. ?

## 2021-05-08 DIAGNOSIS — M545 Low back pain, unspecified: Secondary | ICD-10-CM | POA: Diagnosis not present

## 2021-05-08 DIAGNOSIS — N179 Acute kidney failure, unspecified: Secondary | ICD-10-CM | POA: Diagnosis not present

## 2021-05-08 DIAGNOSIS — J449 Chronic obstructive pulmonary disease, unspecified: Secondary | ICD-10-CM | POA: Diagnosis not present

## 2021-05-08 DIAGNOSIS — I10 Essential (primary) hypertension: Secondary | ICD-10-CM | POA: Diagnosis not present

## 2021-05-08 DIAGNOSIS — K5903 Drug induced constipation: Secondary | ICD-10-CM

## 2021-05-08 LAB — RENAL FUNCTION PANEL
Albumin: 3 g/dL — ABNORMAL LOW (ref 3.5–5.0)
Anion gap: 6 (ref 5–15)
BUN: 12 mg/dL (ref 8–23)
CO2: 28 mmol/L (ref 22–32)
Calcium: 8.6 mg/dL — ABNORMAL LOW (ref 8.9–10.3)
Chloride: 97 mmol/L — ABNORMAL LOW (ref 98–111)
Creatinine, Ser: 0.96 mg/dL (ref 0.61–1.24)
GFR, Estimated: >60 mL/min (ref >60)
Glucose, Bld: 111 mg/dL — ABNORMAL HIGH (ref 70–99)
Phosphorus: 3.4 mg/dL (ref 2.5–4.6)
Potassium: 3.9 mmol/L (ref 3.5–5.1)
Sodium: 131 mmol/L — ABNORMAL LOW (ref 135–145)

## 2021-05-08 LAB — GLUCOSE, CAPILLARY
Glucose-Capillary: 114 mg/dL — ABNORMAL HIGH (ref 70–99)
Glucose-Capillary: 100 mg/dL — ABNORMAL HIGH (ref 70–99)
Glucose-Capillary: 126 mg/dL — ABNORMAL HIGH (ref 70–99)
Glucose-Capillary: 88 mg/dL (ref 70–99)
Glucose-Capillary: 109 mg/dL — ABNORMAL HIGH (ref 70–99)

## 2021-05-08 LAB — CBC
HCT: 41 % (ref 39.0–52.0)
Hemoglobin: 12.9 g/dL — ABNORMAL LOW (ref 13.0–17.0)
MCH: 28.4 pg (ref 26.0–34.0)
MCHC: 31.5 g/dL (ref 30.0–36.0)
MCV: 90.3 fL (ref 80.0–100.0)
Platelets: 367 10*3/uL (ref 150–400)
RBC: 4.54 MIL/uL (ref 4.22–5.81)
RDW: 15 % (ref 11.5–15.5)
WBC: 15.4 10*3/uL — ABNORMAL HIGH (ref 4.0–10.5)
nRBC: 0.1 % (ref 0.0–0.2)

## 2021-05-08 LAB — MAGNESIUM: Magnesium: 1.7 mg/dL (ref 1.7–2.4)

## 2021-05-08 MED ORDER — SENNOSIDES-DOCUSATE SODIUM 8.6-50 MG PO TABS
1.0000 | ORAL_TABLET | Freq: Two times a day (BID) | ORAL | Status: DC | PRN
Start: 2021-05-10 — End: 2021-05-13
  Administered 2021-05-11: 1 via ORAL
  Filled 2021-05-08: qty 1

## 2021-05-08 MED ORDER — SENNOSIDES-DOCUSATE SODIUM 8.6-50 MG PO TABS
1.0000 | ORAL_TABLET | Freq: Two times a day (BID) | ORAL | Status: AC
  Administered 2021-05-08 – 2021-05-09 (×4): 1 via ORAL
  Filled 2021-05-08 (×3): qty 1

## 2021-05-08 NOTE — Assessment & Plan Note (Signed)
Schedule Senokot-S twice daily x4 doses followed by twice daily as needed ?Continue MiraLAX twice daily as needed ?

## 2021-05-08 NOTE — Progress Notes (Signed)
PROGRESS NOTE  Nathan Nicholson JXB:[id number] DOB: 10-22-1955   PCP: [name], NP  Patient is from: Home.  Lives with his wife.  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 Truett Perna admitted to the hospital from oncology office for low back pain evaluation and treatment.  He also had about 30 pounds unintentional weight loss in several months.  MRI lumbar spine with T11 and T12 compression fracture with at least 50% height loss in both.  IR kyphoplasty pending insurance approval.   Subjective: Seen and examined this afternoon.  No major events overnight or this morning.  Continues to endorse about 6/10 pain.  No radicular symptoms.  No bowel or bladder issues but has not had a bowel movement in 4 days now.  Breathing improved.  Objective: Vitals:   05/08/21 0826 05/08/21 0835 05/08/21 1011 05/08/21 1241  BP:   (!) 117/104 120/71  Pulse:    93  Resp:    16  Temp:    98.7 F (37.1 C)  TempSrc:    Oral  SpO2: 96% 96%  90%  Weight:        Examination:  GENERAL: No apparent distress.  Nontoxic. HEENT: MMM.  Vision and hearing grossly intact.  NECK: Supple.  No apparent JVD.  RESP:  No IWOB.  Fair aeration bilaterally. CVS:  RRR. Heart sounds normal.  ABD/GI/GU: BS+. Abd soft, NTND.  MSK/EXT:  Moves extremities. No apparent deformity. No edema.  SKIN: no apparent skin lesion or wound NEURO: Awake and alert. Oriented appropriately.  No apparent focal neuro deficit. PSYCH: Calm. Normal affect.   Procedures:  None  Microbiology summarized: COVID-19 and influenza PCR nonreactive.  Assessment and Plan: * Low back pain Likely due to T11 and T12 compression fractures with 50% height loss as noted on MRI.  He has no radiculopathy, fever,  bowel or bladder issues.  However, he reports about 30 pounds unintentional weight loss in several months.  No significant finding on last colonoscopy but about 10 years ago. -IR kyphoplasty pending insurance approval. -Pain control and bowel regimen -Continue PT/OT-no need identified.  Unintentional weight loss Patient reports about 30 pounds unintentional weight loss in several months.  Reportedly normal colonoscopy 10 years ago.  He is due for repeat.  He denies melena or hematochezia.  No respiratory status change other than his chronic COPD. -Defer further malignancy work-up to oncology -He says he is due for his routine colonoscopy.  Leukocytosis Chronic likely from his splenectomy.  Followed by Dr. Truett Perna.  -Continue monitoring  AKI (acute kidney injury) [facility]) Recent Labs    08/10/20 0858 08/17/20 1228 08/18/20 0458 05/04/21 1100 05/05/21 0515 05/06/21 0503 05/07/21 0545  BUN CREATININE 0.78 0.86 0.92 0.94 1.49* 1.01 0.87  Likely from dehydration, lisinopril and HCTZ.  Resolved. -Continue holding lisinopril and HCTZ. -Check intermittently   COPD (chronic obstructive pulmonary disease) (HCC) Shortness of breath improved.  Still with some rhonchi but improved. -Added LAMA/LABA/Pulmicort on 3/3. -Continue DuoNeb as needed -Incentive spirometry/OOB/PT/OT  Benign essential HTN Normotensive -Continue amlodipine -Continue holding lisinopril and HCTZ -P.o. hydralazine as needed  Drug induced constipation Schedule Senokot-S twice daily x4 doses followed by twice daily as needed Continue MiraLAX twice daily as needed  Hyponatremia  Recheck in the morning. Continue holding lisinopril and HCTZ.  Hypomagnesemia Resolved.  Hypokalemia Resolved.  Obesity (BMI 30-39.9) BMI 34.97 from previous weight.  Patient also reports unintentional weight loss. -Need current weight.  HLD (hyperlipidemia) Continue home statin  Tobacco use Encouraged  tobacco cessation. -Nicotine patch as needed  DM2 (diabetes mellitus, type 2) (HCC) Controlled.  A1c 5.9%. Recent Labs  Lab 05/06/21 1207 05/06/21 1637 05/06/21 2020 05/07/21 0737 05/07/21 1145  GLUCAP 82 83 98 88 107*  -CBG within normal range.  Did not require insulin.  Discontinue CBG monitoring and SSI.     DVT prophylaxis:  heparin injection 5,000 Units Start: 05/04/21 2200  Code Status: Full code Family Communication: Updated patient's wife at bedside on 3/4.  None at bedside today. Level of care: Med-Surg Status is: Inpatient The patient will remain inpatient because: Need for IR kyphoplasty for T11 and T12 compression fracture with significant height loss and significant back pain     Final disposition: Home    Consultants:  Oncology IR   Sch Meds:  Scheduled Meds:  amLODipine  10 mg Oral Daily   aspirin EC  81 mg Oral Daily   atorvastatin  10 mg Oral Daily   budesonide  0.5 mg Nebulization BID   docusate sodium  100 mg Oral BID   heparin  5,000 Units Subcutaneous Q8H   senna-docusate  1 tablet Oral BID   umeclidinium-vilanterol  1 puff Inhalation Daily   Continuous Infusions:  methocarbamol (ROBAXIN) IV     PRN Meds:.acetaminophen, guaiFENesin, hydrALAZINE, ipratropium-albuterol, methocarbamol (ROBAXIN) IV, metoprolol tartrate, morphine injection, ondansetron **OR** ondansetron (ZOFRAN) IV, oxyCODONE, polyethylene glycol, senna-docusate **FOLLOWED BY** [START ON 05/10/2021] senna-docusate, traZODone  Antimicrobials: Anti-infectives (From admission, onward)    None        I have personally reviewed the following labs and images: CBC: Recent Labs  Lab 05/04/21 1100 05/05/21 0515 05/06/21 0503 05/07/21 0545 05/08/21 0516  WBC 15.8* 12.4* 11.6* 13.9* 15.4*  NEUTROABS 10.3*  --   --   --   --   HGB 15.3 13.2 14.2 12.9* 12.9*  HCT 47.4 41.1 45.3 40.6 41.0  MCV 88.8 90.7 91.9 90.4 90.3  PLT 428* 382 394 369 367   BMP &GFR Recent Labs   Lab 05/04/21 1100 05/05/21 0515 05/06/21 0503 05/07/21 0545 05/08/21 0516  NA 137 132* 137 134* 131*  K 3.7 3.5 3.6 3.3* 3.9  CL 95* 96* 100 98 97*  CO2 32 30 30 28 28   GLUCOSE 121* 91 100* 87 111*  BUN 10 19 15 13 12   CREATININE 0.94 1.49* 1.01 0.87 0.96  CALCIUM 10.4* 8.6* 8.9 8.8* 8.6*  MG  --  1.8 1.8 1.5* 1.7  PHOS  --   --  3.8 3.8 3.4   Estimated Creatinine Clearance: 75.4 mL/min (by C-G formula based on SCr of 0.96 mg/dL). Liver & Pancreas: Recent Labs  Lab 05/04/21 1100 05/05/21 0515 05/06/21 0503 05/07/21 0545 05/08/21 0516  AST 24 19  --   --   --   ALT 28 21  --   --   --   ALKPHOS 142* 108  --   --   --   BILITOT 0.8 0.6  --   --   --   PROT 8.3* 7.1  --   --   --   ALBUMIN 3.8 3.0* 3.1* 2.9* 3.0*   No results for input(s): LIPASE, AMYLASE in the last 168 hours. No results for input(s): AMMONIA in the  last 168 hours. Diabetic: No results for input(s): HGBA1C in the last 72 hours.  Recent Labs  Lab 05/07/21 1145 05/07/21 1712 05/08/21 0207 05/08/21 0740 05/08/21 1123  GLUCAP 107* 107* 114* 100* 126*   Cardiac Enzymes: Recent Labs  Lab 05/06/21 0503  CKTOTAL 79   No results for input(s): PROBNP in the last 8760 hours. Coagulation Profile: Recent Labs  Lab 05/06/21 0503  INR 1.0   Thyroid Function Tests: No results for input(s): TSH, T4TOTAL, FREET4, T3FREE, THYROIDAB in the last 72 hours.  Lipid Profile: No results for input(s): CHOL, HDL, LDLCALC, TRIG, CHOLHDL, LDLDIRECT in the last 72 hours. Anemia Panel: No results for input(s): VITAMINB12, FOLATE, FERRITIN, TIBC, IRON, RETICCTPCT in the last 72 hours. Urine analysis: No results found for: COLORURINE, APPEARANCEUR, LABSPEC, PHURINE, GLUCOSEU, HGBUR, BILIRUBINUR, KETONESUR, PROTEINUR, UROBILINOGEN, NITRITE, LEUKOCYTESUR Sepsis Labs: Invalid input(s): PROCALCITONIN, LACTICIDVEN  Microbiology: Recent Results (from the past 240 hour(s))  Resp Panel by RT-PCR (Flu A&B, Covid)  Nasopharyngeal Swab     Status: None   Collection Time: 05/04/21  5:01 PM   Specimen: Nasopharyngeal Swab; Nasopharyngeal(NP) swabs in vial transport medium  Result Value Ref Range Status   SARS Coronavirus 2 by RT PCR NEGATIVE NEGATIVE Final    Comment: (NOTE) SARS-CoV-2 target nucleic acids are NOT DETECTED.  The SARS-CoV-2 RNA is generally detectable in upper respiratory specimens during the acute phase of infection. The lowest concentration of SARS-CoV-2 viral copies this assay can detect is 138 copies/mL. A negative result does not preclude SARS-Cov-2 infection and should not be used as the sole basis for treatment or other patient management decisions. A negative result may occur with  improper specimen collection/handling, submission of specimen other than nasopharyngeal swab, presence of viral mutation(s) within the areas targeted by this assay, and inadequate number of viral copies(<138 copies/mL). A negative result must be combined with clinical observations, patient history, and epidemiological information. The expected result is Negative.  Fact Sheet for Patients:  BloggerCourse.com  Fact Sheet for Healthcare Providers:  SeriousBroker.it  This test is no t yet approved or cleared by the [place] FDA and  has been authorized for detection and/or diagnosis of SARS-CoV-2 by FDA under an Emergency Use Authorization (EUA). This EUA will remain  in effect (meaning this test can be used) for the duration of the COVID-19 declaration under Section 564(b)(1) of the Act, 21 U.S.C.section 360bbb-3(b)(1), unless the authorization is terminated  or revoked sooner.       Influenza A by PCR NEGATIVE NEGATIVE Final   Influenza B by PCR NEGATIVE NEGATIVE Final    Comment: (NOTE) The Xpert Xpress SARS-CoV-2/FLU/RSV plus assay is intended as an aid in the diagnosis of influenza from Nasopharyngeal swab specimens and should not be  used as a sole basis for treatment. Nasal washings and aspirates are unacceptable for Xpert Xpress SARS-CoV-2/FLU/RSV testing.  Fact Sheet for Patients: BloggerCourse.com  Fact Sheet for Healthcare Providers: SeriousBroker.it  This test is not yet approved or cleared by the [place] FDA and has been authorized for detection and/or diagnosis of SARS-CoV-2 by FDA under an Emergency Use Authorization (EUA). This EUA will remain in effect (meaning this test can be used) for the duration of the COVID-19 declaration under Section 564(b)(1) of the Act, 21 U.S.C. section 360bbb-3(b)(1), unless the authorization is terminated or revoked.  Performed at [facility], [address] [place], [place] [address]     Radiology Studies: No results found.    Jurnei Latini T. Saphira Lahmann  [facility]  If 7PM-7AM, please contact night-coverage [email or web address] 05/08/2021, 2:24 PM

## 2021-05-08 NOTE — Progress Notes (Addendum)
Patient can't remember if he's had a flu and pneumonia vaccine recently. Nathan Nicholson sat up in the chair for several hours today, tolerated well. ?

## 2021-05-08 NOTE — Progress Notes (Signed)
Physical Therapy Treatment ?Patient Details ?Name: Nathan Nicholson ?MRN: [id number] ?DOB: 10-19-1955 ?Today's Date: 05/08/2021 ? ? ?History of Present Illness Patient is a 66 year old male admitted from oncology while there for work up of leukocytosis. While in office patient having severe back pain. Patient had MRI by orthopedics in [place] revealing T12 cmopression fx. PMH: Felty syndrome status post splenectomy, COPD, tobacco use, HTN, rheumatoid arthritis, DMII ? ?  ?PT Comments  ? ? Pt was happy to get OOB and ambulate in the hallway today.  He complained of L knee pain from not moving as much as he normally does.  O2 92% on RA despite dyspnea.  Will continue to follow and await orders regarding kyphoplasty.   ?Recommendations for follow up therapy are one component of a multi-disciplinary discharge planning process, led by the attending physician.  Recommendations may be updated based on patient status, additional functional criteria and insurance authorization. ? ?Follow Up Recommendations ? No PT follow up ?  ?  ?Assistance Recommended at Discharge    ?Patient can return home with the following A little help with walking and/or transfers;A little help with bathing/dressing/bathroom;Assistance with cooking/housework;Help with stairs or ramp for entrance ?  ?Equipment Recommendations ? None recommended by PT  ?  ?Recommendations for Other Services   ? ? ?  ?Precautions / Restrictions Precautions ?Precautions: Back ?Precaution Comments: T12 compression fracture ?Restrictions ?Weight Bearing Restrictions: No  ?  ? ?Mobility ? Bed Mobility ?Overal bed mobility: Needs Assistance ?Bed Mobility: Supine to Sit ?  ?  ?Supine to sit: Min guard ?  ?  ?General bed mobility comments: attempted to have patient log roll first and he did a semi roll and then came up to EOB. ?  ? ?Transfers ?Overall transfer level: Needs assistance ?Equipment used: Rolling walker (2 wheels) ?Transfers: Sit to/from Stand ?Sit to Stand: Min  guard ?  ?  ?  ?  ?  ?General transfer comment: cues for hand placement and encouragement to push ?  ? ?Ambulation/Gait ?Ambulation/Gait assistance: Min guard ?Gait Distance (Feet): 70 Feet (x2) ?Assistive device: Rolling walker (2 wheels) ?Gait Pattern/deviations: Step-through pattern, Antalgic, Trunk flexed ?Gait velocity: speed varied throughout gait ?  ?  ?General Gait Details: cues for sequencing for L knee pain and for proper position within RW.  Pt with dyspnea on RA and o2 92% and HR 111. Sitting break. ? ? ?Stairs ?  ?  ?  ?  ?  ? ? ?Wheelchair Mobility ?  ? ?Modified Rankin (Stroke Patients Only) ?  ? ? ?  ?Balance Overall balance assessment: Needs assistance ?Sitting-balance support: Feet supported ?Sitting balance-Leahy Scale: Good ?  ?  ?Standing balance support: Bilateral upper extremity supported ?Standing balance-Leahy Scale: Fair ?Standing balance comment: fair static, requires RW for gait ?  ?  ?  ?  ?  ?  ?  ?  ?  ?  ?  ?  ? ?  ?Cognition Arousal/Alertness: Awake/alert ?Behavior During Therapy: [facility] for tasks assessed/performed ?Overall Cognitive Status: Within Functional Limits for tasks assessed ?  ?  ?  ?  ?  ?  ?  ?  ?  ?  ?  ?  ?  ?  ?  ?  ?  ?  ?  ? ?  ?Exercises   ? ?  ?General Comments   ?  ?  ? ?Pertinent Vitals/Pain Pain Assessment ?Pain Assessment: 0-10 ?Pain Score: 6  ?Pain Location: L knee ?Pain Descriptors /  Indicators: Sore ?Pain Intervention(s): Monitored during session, Repositioned, Limited activity within patient's tolerance  ? ? ?Home Living   ?  ?  ?  ?  ?  ?  ?  ?  ?  ?   ?  ?Prior Function    ?  ?  ?   ? ?PT Goals (current goals can now be found in the care plan section) Acute Rehab PT Goals ?Patient Stated Goal: to get moving ?PT Goal Formulation: With patient ?Time For Goal Achievement: 05/19/21 ?Potential to Achieve Goals: Good ?Progress towards PT goals: Progressing toward goals ? ?  ?Frequency ? ? ? Min 3X/week ? ? ? ?  ?PT Plan Current plan remains appropriate   ? ? ?Co-evaluation   ?  ?  ?  ?  ? ?  ?AM-PAC PT "6 Clicks" Mobility   ?Outcome Measure ? Help needed turning from your back to your side while in a flat bed without using bedrails?: A Little ?Help needed moving from lying on your back to sitting on the side of a flat bed without using bedrails?: A Little ?Help needed moving to and from a bed to a chair (including a wheelchair)?: A Little ?Help needed standing up from a chair using your arms (e.g., wheelchair or bedside chair)?: A Little ?Help needed to walk in hospital room?: A Little ?Help needed climbing 3-5 steps with a railing? : A Little ?6 Click Score: 18 ? ?  ?End of Session Equipment Utilized During Treatment: Gait belt ?Activity Tolerance: Patient tolerated treatment well ?Patient left: in chair;with call bell/phone within reach ?Nurse Communication: Mobility status ?PT Visit Diagnosis: Other symptoms and signs involving the nervous system (R29.898);Unsteadiness on feet (R26.81);Pain ?  ? ? ?Time: 1610-9604 ?PT Time Calculation (min) (ACUTE ONLY): 21 min ? ?Charges:  $Gait Training: 8-22 mins          ?          ? ?Clydie Braun L. Katrinka Blazing, PT ? ?05/08/2021 ? ? ? ?[name] ?05/08/2021, 2:29 PM ? ?

## 2021-05-09 ENCOUNTER — Telehealth: Payer: Self-pay | Admitting: Oncology

## 2021-05-09 DIAGNOSIS — N179 Acute kidney failure, unspecified: Secondary | ICD-10-CM | POA: Diagnosis not present

## 2021-05-09 DIAGNOSIS — M545 Low back pain, unspecified: Secondary | ICD-10-CM | POA: Diagnosis not present

## 2021-05-09 DIAGNOSIS — J449 Chronic obstructive pulmonary disease, unspecified: Secondary | ICD-10-CM | POA: Diagnosis not present

## 2021-05-09 DIAGNOSIS — I10 Essential (primary) hypertension: Secondary | ICD-10-CM | POA: Diagnosis not present

## 2021-05-09 LAB — CBC
HCT: 40.9 % (ref 39.0–52.0)
Hemoglobin: 13.1 g/dL (ref 13.0–17.0)
MCH: 28.9 pg (ref 26.0–34.0)
MCHC: 32 g/dL (ref 30.0–36.0)
MCV: 90.1 fL (ref 80.0–100.0)
Platelets: 352 10*3/uL (ref 150–400)
RBC: 4.54 MIL/uL (ref 4.22–5.81)
RDW: 15.2 % (ref 11.5–15.5)
WBC: 14.1 10*3/uL — ABNORMAL HIGH (ref 4.0–10.5)
nRBC: 0 % (ref 0.0–0.2)

## 2021-05-09 LAB — RENAL FUNCTION PANEL
Albumin: 3 g/dL — ABNORMAL LOW (ref 3.5–5.0)
Anion gap: 7 (ref 5–15)
BUN: 14 mg/dL (ref 8–23)
CO2: 27 mmol/L (ref 22–32)
Calcium: 8.9 mg/dL (ref 8.9–10.3)
Chloride: 98 mmol/L (ref 98–111)
Creatinine, Ser: 1.08 mg/dL (ref 0.61–1.24)
GFR, Estimated: >60 mL/min (ref >60)
Glucose, Bld: 111 mg/dL — ABNORMAL HIGH (ref 70–99)
Phosphorus: 4.7 mg/dL — ABNORMAL HIGH (ref 2.5–4.6)
Potassium: 4 mmol/L (ref 3.5–5.1)
Sodium: 132 mmol/L — ABNORMAL LOW (ref 135–145)

## 2021-05-09 LAB — GLUCOSE, CAPILLARY: Glucose-Capillary: 99 mg/dL (ref 70–99)

## 2021-05-09 LAB — MAGNESIUM: Magnesium: 1.8 mg/dL (ref 1.7–2.4)

## 2021-05-09 NOTE — Progress Notes (Signed)
Physical Therapy Treatment ?Patient Details ?Name: Nathan Nicholson ?MRN: [id number] ?DOB: 12-Apr-1955 ?Today's Date: 05/09/2021 ? ? ?History of Present Illness Patient is a 66 year old male admitted from oncology while there for work up of leukocytosis. While in office patient having severe back pain. Patient had MRI by orthopedics in [place] revealing T12 cmopression fx. Pt admitted 05/04/21 for low back pain and plan for kyphoplasty once insurance approved. PMH: Felty syndrome status post splenectomy, COPD, tobacco use, HTN, rheumatoid arthritis, DMII ? ?  ?PT Comments  ? ? Pt making gradual progress, but overall remains limited by back pain.  Pt also reports stiffness due to being in bed for a couple days - educated/encouraged to be up with nursing to bathroom, chair, short walks in addition to therapy - also updated white board communication to nursing.    ?Recommendations for follow up therapy are one component of a multi-disciplinary discharge planning process, led by the attending physician.  Recommendations may be updated based on patient status, additional functional criteria and insurance authorization. ? ?Follow Up Recommendations ? No PT follow up ?  ?  ?Assistance Recommended at Discharge Set up Supervision/Assistance  ?Patient can return home with the following A little help with walking and/or transfers;A little help with bathing/dressing/bathroom;Assistance with cooking/housework;Help with stairs or ramp for entrance ?  ?Equipment Recommendations ? None recommended by PT  ?  ?Recommendations for Other Services   ? ? ?  ?Precautions / Restrictions Precautions ?Precautions: Back ?Precaution Comments: T12 compression fracture - back precautions for comfort  ?  ? ?Mobility ? Bed Mobility ?Overal bed mobility: Needs Assistance ?Bed Mobility: Sidelying to Sit, Sit to Sidelying, Rolling ?Rolling: Supervision ?Sidelying to sit: Min assist ?  ?  ?Sit to sidelying: Min assist ?General bed mobility comments: Cues for  log roll technique, light min A and heavy use of rails to sit, assist for legs back to bed ?  ? ?Transfers ?Overall transfer level: Needs assistance ?Equipment used: Rolling walker (2 wheels) ?Transfers: Sit to/from Stand ?Sit to Stand: Min assist ?  ?  ?  ?  ?  ?General transfer comment: Light min A with cues for hand placement; performed x 3 ?  ? ?Ambulation/Gait ?Ambulation/Gait assistance: Min guard ?Gait Distance (Feet): 60 Feet (40' then 69') ?Assistive device: Rolling walker (2 wheels) ?Gait Pattern/deviations: Step-through pattern, Trunk flexed ?  ?  ?  ?General Gait Details: Needing min cues to stay close to RW; had chair follow for rest breaks; speed improved as pt "loosened up" - reports knees stiff initially ? ? ?Stairs ?  ?  ?  ?  ?  ? ? ?Wheelchair Mobility ?  ? ?Modified Rankin (Stroke Patients Only) ?  ? ? ?  ?Balance Overall balance assessment: Needs assistance ?Sitting-balance support: Feet supported ?Sitting balance-Leahy Scale: Good ?  ?  ?Standing balance support: Bilateral upper extremity supported, No upper extremity supported ?Standing balance-Leahy Scale: Fair ?Standing balance comment: fair static, requires RW for gait ?  ?  ?  ?  ?  ?  ?  ?  ?  ?  ?  ?  ? ?  ?Cognition Arousal/Alertness: Awake/alert ?Behavior During Therapy: [facility] for tasks assessed/performed ?Overall Cognitive Status: Within Functional Limits for tasks assessed ?  ?  ?  ?  ?  ?  ?  ?  ?  ?  ?  ?  ?  ?  ?  ?  ?  ?  ?  ? ?  ?Exercises   ? ?  ?  General Comments General comments (skin integrity, edema, etc.): Pt on 2 L but reports doesn't use at all times at home and doesn't want for walking.  Sats remained 92% with ambulation.  Pt donned O2 upon return to bed ?  ?  ? ?Pertinent Vitals/Pain Pain Assessment ?Pain Assessment: 0-10 ?Pain Score: 5  ?Pain Location: back; also reports soreness/stiffness in knee ?Pain Intervention(s): Limited activity within patient's tolerance, Monitored during session  ? ? ?Home Living   ?  ?  ?  ?   ?  ?  ?  ?  ?  ?   ?  ?Prior Function    ?  ?  ?   ? ?PT Goals (current goals can now be found in the care plan section) Progress towards PT goals: Progressing toward goals ? ?  ?Frequency ? ? ? Min 3X/week ? ? ? ?  ?PT Plan Current plan remains appropriate (updated white board)  ? ? ?Co-evaluation   ?  ?  ?  ?  ? ?  ?AM-PAC PT "6 Clicks" Mobility   ?Outcome Measure ? Help needed turning from your back to your side while in a flat bed without using bedrails?: A Little ?Help needed moving from lying on your back to sitting on the side of a flat bed without using bedrails?: A Little ?Help needed moving to and from a bed to a chair (including a wheelchair)?: A Little ?Help needed standing up from a chair using your arms (e.g., wheelchair or bedside chair)?: A Little ?Help needed to walk in hospital room?: A Little ?Help needed climbing 3-5 steps with a railing? : A Little ?6 Click Score: 18 ? ?  ?End of Session Equipment Utilized During Treatment: Gait belt ?Activity Tolerance: Patient tolerated treatment well ?Patient left: in bed;with call bell/phone within reach;with bed alarm set ?Nurse Communication: Mobility status ?PT Visit Diagnosis: Other symptoms and signs involving the nervous system (R29.898);Unsteadiness on feet (R26.81);Pain ?  ? ? ?Time: 1423-9532 ?PT Time Calculation (min) (ACUTE ONLY): 22 min ? ?Charges:  $Gait Training: 8-22 mins          ?          ? ?Anise Salvo, PT ?Acute Rehab Services ?Pager [phone] ?[facility] [phone] ? ? ? ?Billey Chang Anabella Capshaw ?05/09/2021, 3:47 PM ? ?

## 2021-05-09 NOTE — Telephone Encounter (Signed)
Attempted to contact patient to schedule F/U visit with Dr. Benay Spice from inbasket message. No answer so voicemail was left.  ?

## 2021-05-09 NOTE — Care Management Important Message (Signed)
Important Message ? ?Patient Details IM Letter given to the Patient. ?Name: Nathan Nicholson ?MRN: [id number] ?Date of Birth: August 09, 1955 ? ? ?Medicare Important Message Given:  Yes ? ? ? ? ?Kerin Salen ?05/09/2021, 10:09 AM ?

## 2021-05-09 NOTE — Progress Notes (Signed)
PROGRESS NOTE  Nathan Nicholson [id number] DOB: 1955-07-25   PCP: Earlyne Iba, NP  Patient is from: Home.  Lives with his wife.  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 Benay Spice admitted to the hospital from oncology office for low back pain evaluation and treatment.  He also had about 30 pounds unintentional weight loss in several months.  MRI lumbar spine with T11 and T12 compression fracture with at least 50% height loss in both.  IR kyphoplasty pending insurance approval.   Subjective: Seen and examined earlier this morning.  No major events overnight of this morning.  Continues to endorse low back pain.  Denies chest pain, shortness of breath, GI or UTI symptoms.  Objective: Vitals:   05/09/21 0506 05/09/21 0742 05/09/21 0927 05/09/21 1316  BP: 118/77  102/67 111/70  Pulse: 95  88 89  Resp: 18   16  Temp: 98.6 F (37 C)   98.3 F (36.8 C)  TempSrc: Oral   Oral  SpO2: 94% 94%  94%  Weight:      Height:        Examination:  GENERAL: No apparent distress.  Nontoxic. HEENT: MMM.  Vision and hearing grossly intact.  NECK: Supple.  No apparent JVD.  RESP:  No IWOB.  Fair aeration bilaterally. CVS:  RRR. Heart sounds normal.  ABD/GI/GU: BS+. Abd soft, NTND.  MSK/EXT:  Moves extremities. No apparent deformity. No edema.  SKIN: no apparent skin lesion or wound NEURO: Awake and alert. Oriented appropriately.  No apparent focal neuro deficit. PSYCH: Calm. Normal affect.   Procedures:  None  Microbiology summarized: [id number] and influenza PCR nonreactive.  Assessment and Plan: * Low back pain Likely due to T11 and T12 compression fractures with 50% height loss as noted on MRI.  He has no radiculopathy, fever, bowel or bladder  issues.  However, he reports about 30 pounds unintentional weight loss in several months.  No significant finding on last colonoscopy but about 10 years ago. -IR kyphoplasty pending insurance approval. -Pain control and bowel regimen -Continue PT/OT-no need identified.  Unintentional weight loss Patient reports about 30 pounds unintentional weight loss in several months.  Reportedly normal colonoscopy 10 years ago.  He is due for repeat.  He denies melena or hematochezia.  No respiratory status change other than his chronic COPD. -Defer further malignancy work-up to oncology -He says he is due for his routine colonoscopy.  Leukocytosis Chronic likely from his splenectomy.  Followed by Dr. Benay Spice.  -Continue monitoring  AKI (acute kidney injury) [facility]) Recent Labs    08/10/20 0858 08/17/20 1228 08/18/20 0458 05/04/21 1100 05/05/21 0515 05/06/21 0503 05/07/21 0545  BUN 13 9 11 10 19 15 13   CREATININE 0.78 0.86 0.92 0.94 1.49* 1.01 0.87  Likely from dehydration, lisinopril and HCTZ.  Resolved. -Continue holding lisinopril and HCTZ. -Check intermittently   COPD (chronic obstructive pulmonary disease) (HCC) Shortness of breath improved.  Still with some rhonchi but improved. -Added LAMA/LABA/Pulmicort on 3/3. -Continue DuoNeb as needed -Incentive spirometry/OOB/PT/OT  Benign essential HTN Normotensive -Continue amlodipine -Continue holding lisinopril and HCTZ -P.o. hydralazine as needed  Drug induced constipation Schedule Senokot-S twice daily x4 doses followed by twice daily as needed Continue MiraLAX twice daily as needed  Hyponatremia Recheck in the morning.  Continue holding lisinopril and HCTZ.  Hypomagnesemia Resolved.  Hypokalemia Resolved.  Obesity (BMI 30-39.9) BMI 34.97 from previous weight.  Patient also reports unintentional weight loss. -Need current weight.  HLD (hyperlipidemia) Continue home statin  Tobacco use Encouraged tobacco  cessation. -Nicotine patch as needed  DM2 (diabetes mellitus, type 2) ([place]) Controlled.  A1c 5.9%. Recent Labs  Lab 05/06/21 1207 05/06/21 1637 05/06/21 2020 05/07/21 0737 05/07/21 1145  GLUCAP 82 83 98 88 107*  -CBG within normal range.  Did not require insulin.  Discontinue CBG monitoring and SSI.     DVT prophylaxis:  heparin injection 5,000 Units Start: 05/04/21 2200  Code Status: Full code Family Communication: Updated patient's wife at bedside on 3/4.  None at bedside today. Level of care: Med-Surg Status is: Inpatient The patient will remain inpatient because: Need for IR kyphoplasty for T11 and T12 compression fracture with significant height loss and significant back pain     Final disposition: Home    Consultants:  Oncology IR   Sch Meds:  Scheduled Meds:  amLODipine  10 mg Oral Daily   aspirin EC  81 mg Oral Daily   atorvastatin  10 mg Oral Daily   budesonide  0.5 mg Nebulization BID   docusate sodium  100 mg Oral BID   heparin  5,000 Units Subcutaneous Q8H   senna-docusate  1 tablet Oral BID   umeclidinium-vilanterol  1 puff Inhalation Daily   Continuous Infusions:  methocarbamol (ROBAXIN) IV     PRN Meds:.acetaminophen, guaiFENesin, hydrALAZINE, ipratropium-albuterol, methocarbamol (ROBAXIN) IV, metoprolol tartrate, morphine injection, ondansetron **OR** ondansetron (ZOFRAN) IV, oxyCODONE, polyethylene glycol, senna-docusate **FOLLOWED BY** [START ON 05/10/2021] senna-docusate, traZODone  Antimicrobials: Anti-infectives (From admission, onward)    None        I have personally reviewed the following labs and images: CBC: Recent Labs  Lab 05/04/21 1100 05/05/21 0515 05/06/21 0503 05/07/21 0545 05/08/21 0516 05/09/21 0509  WBC 15.8* 12.4* 11.6* 13.9* 15.4* 14.1*  NEUTROABS 10.3*  --   --   --   --   --   HGB 15.3 13.2 14.2 12.9* 12.9* 13.1  HCT 47.4 41.1 45.3 40.6 41.0 40.9  MCV 88.8 90.7 91.9 90.4 90.3 90.1  PLT 428* 382 394 369  367 352   BMP &GFR Recent Labs  Lab 05/05/21 0515 05/06/21 0503 05/07/21 0545 05/08/21 0516 05/09/21 0509  NA 132* 137 134* 131* 132*  K 3.5 3.6 3.3* 3.9 4.0  CL 96* 100 98 97* 98  CO2 30 30 28 28 27   GLUCOSE 91 100* 87 111* 111*  BUN 19 15 13 12 14   CREATININE 1.49* 1.01 0.87 0.96 1.08  CALCIUM 8.6* 8.9 8.8* 8.6* 8.9  MG 1.8 1.8 1.5* 1.7 1.8  PHOS  --  3.8 3.8 3.4 4.7*   Estimated Creatinine Clearance: 67 mL/min (by C-G formula based on SCr of 1.08 mg/dL). Liver & Pancreas: Recent Labs  Lab 05/04/21 1100 05/05/21 0515 05/06/21 0503 05/07/21 0545 05/08/21 0516 05/09/21 0509  AST 24 19  --   --   --   --   ALT 28 21  --   --   --   --   ALKPHOS 142* 108  --   --   --   --   BILITOT 0.8 0.6  --   --   --   --   PROT 8.3* 7.1  --   --   --   --   ALBUMIN 3.8 3.0* 3.1* 2.9* 3.0* 3.0*  No results for input(s): LIPASE, AMYLASE in the last 168 hours. No results for input(s): AMMONIA in the last 168 hours. Diabetic: No results for input(s): HGBA1C in the last 72 hours.  Recent Labs  Lab 05/08/21 0740 05/08/21 1123 05/08/21 1725 05/08/21 2151 05/09/21 0803  GLUCAP 100* 126* 88 109* 99   Cardiac Enzymes: Recent Labs  Lab 05/06/21 0503  CKTOTAL 79   No results for input(s): PROBNP in the last 8760 hours. Coagulation Profile: Recent Labs  Lab 05/06/21 0503  INR 1.0   Thyroid Function Tests: No results for input(s): TSH, T4TOTAL, FREET4, T3FREE, THYROIDAB in the last 72 hours.  Lipid Profile: No results for input(s): CHOL, HDL, LDLCALC, TRIG, CHOLHDL, LDLDIRECT in the last 72 hours. Anemia Panel: No results for input(s): VITAMINB12, FOLATE, FERRITIN, TIBC, IRON, RETICCTPCT in the last 72 hours. Urine analysis: No results found for: COLORURINE, APPEARANCEUR, LABSPEC, PHURINE, GLUCOSEU, HGBUR, BILIRUBINUR, KETONESUR, PROTEINUR, UROBILINOGEN, NITRITE, LEUKOCYTESUR Sepsis Labs: Invalid input(s): PROCALCITONIN, [place]  Microbiology: Recent Results  (from the past 240 hour(s))  Resp Panel by RT-PCR (Flu A&B, Covid) Nasopharyngeal Swab     Status: None   Collection Time: 05/04/21  5:01 PM   Specimen: Nasopharyngeal Swab; Nasopharyngeal(NP) swabs in vial transport medium  Result Value Ref Range Status   SARS Coronavirus 2 by RT PCR NEGATIVE NEGATIVE Final    Comment: (NOTE) SARS-CoV-2 target nucleic acids are NOT DETECTED.  The SARS-CoV-2 RNA is generally detectable in upper respiratory specimens during the acute phase of infection. The lowest concentration of SARS-CoV-2 viral copies this assay can detect is 138 copies/mL. A negative result does not preclude SARS-Cov-2 infection and should not be used as the sole basis for treatment or other patient management decisions. A negative result may occur with  improper specimen collection/handling, submission of specimen other than nasopharyngeal swab, presence of viral mutation(s) within the areas targeted by this assay, and inadequate number of viral copies(<138 copies/mL). A negative result must be combined with clinical observations, patient history, and epidemiological information. The expected result is Negative.  Fact Sheet for Patients:  EntrepreneurPulse.com.au  Fact Sheet for Healthcare Providers:  IncredibleEmployment.be  This test is no t yet approved or cleared by the [place] FDA and  has been authorized for detection and/or diagnosis of SARS-CoV-2 by FDA under an Emergency Use Authorization (EUA). This EUA will remain  in effect (meaning this test can be used) for the duration of the COVID-19 declaration under Section 564(b)(1) of the Act, 21 U.S.C.section 360bbb-3(b)(1), unless the authorization is terminated  or revoked sooner.       Influenza A by PCR NEGATIVE NEGATIVE Final   Influenza B by PCR NEGATIVE NEGATIVE Final    Comment: (NOTE) The Xpert Xpress SARS-CoV-2/FLU/RSV plus assay is intended as an aid in the  diagnosis of influenza from Nasopharyngeal swab specimens and should not be used as a sole basis for treatment. Nasal washings and aspirates are unacceptable for Xpert Xpress SARS-CoV-2/FLU/RSV testing.  Fact Sheet for Patients: EntrepreneurPulse.com.au  Fact Sheet for Healthcare Providers: IncredibleEmployment.be  This test is not yet approved or cleared by the [place] FDA and has been authorized for detection and/or diagnosis of SARS-CoV-2 by FDA under an Emergency Use Authorization (EUA). This EUA will remain in effect (meaning this test can be used) for the duration of the COVID-19 declaration under Section 564(b)(1) of the Act, 21 U.S.C. section 360bbb-3(b)(1), unless the authorization is terminated or revoked.  Performed at [facility], [address]  [place], [place] [address]     Radiology Studies: No results found.    Natasha Burda T. [place]  If 7PM-7AM, please contact night-coverage [email or web address] 05/09/2021, 1:57 PM

## 2021-05-10 DIAGNOSIS — N179 Acute kidney failure, unspecified: Secondary | ICD-10-CM | POA: Diagnosis not present

## 2021-05-10 DIAGNOSIS — I10 Essential (primary) hypertension: Secondary | ICD-10-CM | POA: Diagnosis not present

## 2021-05-10 DIAGNOSIS — J449 Chronic obstructive pulmonary disease, unspecified: Secondary | ICD-10-CM | POA: Diagnosis not present

## 2021-05-10 DIAGNOSIS — M545 Low back pain, unspecified: Secondary | ICD-10-CM | POA: Diagnosis not present

## 2021-05-10 LAB — CBC
HCT: 39.5 % (ref 39.0–52.0)
Hemoglobin: 12.9 g/dL — ABNORMAL LOW (ref 13.0–17.0)
MCH: 29.2 pg (ref 26.0–34.0)
MCHC: 32.7 g/dL (ref 30.0–36.0)
MCV: 89.4 fL (ref 80.0–100.0)
Platelets: 341 10*3/uL (ref 150–400)
RBC: 4.42 MIL/uL (ref 4.22–5.81)
RDW: 15.1 % (ref 11.5–15.5)
WBC: 12.8 10*3/uL — ABNORMAL HIGH (ref 4.0–10.5)
nRBC: 0 % (ref 0.0–0.2)

## 2021-05-10 NOTE — Progress Notes (Signed)
PROGRESS NOTE  Nathan Nicholson [id number] DOB: 16-Feb-1956   PCP: Earlyne Iba, NP  Patient is from: Home.  Lives with his wife.  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 Benay Spice admitted to the hospital from oncology office for low back pain evaluation and treatment.  He also had about 30 pounds unintentional weight loss in several months.  MRI lumbar spine with T11 and T12 compression fracture with at least 50% height loss in both.  IR kyphoplasty pending insurance approval.   Subjective: Seen and examined earlier this morning.  No major events overnight of this morning.  Continues to endorse low back pain, mainly with movement.  Pain goes up to 7-8 when he moves around.  Negligible lying down in bed.  No other complaints.  Objective: Vitals:   05/09/21 2024 05/10/21 0437 05/10/21 0758 05/10/21 1313  BP: 111/74 103/73  109/74  Pulse: 97 81  91  Resp: 18 20  18   Temp: 98.2 F (36.8 C) 98.1 F (36.7 C)  97.6 F (36.4 C)  TempSrc: Oral Oral  Oral  SpO2: 94% 96% 97% 93%  Weight:      Height:        Examination:  GENERAL: No apparent distress.  Nontoxic. HEENT: MMM.  Vision and hearing grossly intact.  NECK: Supple.  No apparent JVD.  RESP:  No IWOB.  Fair aeration bilaterally. CVS:  RRR. Heart sounds normal.  ABD/GI/GU: BS+. Abd soft, NTND.  MSK/EXT:  Moves extremities. No apparent deformity. No edema.  SKIN: no apparent skin lesion or wound NEURO: Awake and alert. Oriented appropriately.  No apparent focal neuro deficit. PSYCH: Calm. Normal affect.   Procedures:  None  Microbiology summarized: [id number] and influenza PCR nonreactive.  Assessment and Plan: * Low back pain Likely due to T11 and T12 compression fractures  with 50% height loss as noted on MRI.  He has no radiculopathy, fever, bowel or bladder issues.  However, he reports about 30 pounds unintentional weight loss in several months.  No significant finding on last colonoscopy but about 10 years ago. -IR kyphoplasty pending insurance approval. -Pain control and bowel regimen -Continue PT/OT-no need identified.  Unintentional weight loss Patient reports about 30 pounds unintentional weight loss in several months.  Reportedly normal colonoscopy 10 years ago.  He is due for repeat.  He denies melena or hematochezia.  No respiratory status change other than his chronic COPD. -Defer further malignancy work-up to oncology -He says he is due for his routine colonoscopy.  Leukocytosis Chronic likely from his splenectomy.  Followed by Dr. Benay Spice.  -Continue monitoring  AKI (acute kidney injury) [facility]) Recent Labs    08/10/20 0858 08/17/20 1228 08/18/20 0458 05/04/21 1100 05/05/21 0515 05/06/21 0503 05/07/21 0545  BUN 13 9 11 10 19 15 13   CREATININE 0.78 0.86 0.92 0.94 1.49* 1.01 0.87  Likely from dehydration, lisinopril and HCTZ.  Resolved. -Continue holding lisinopril and HCTZ. -Check intermittently   COPD (chronic obstructive pulmonary disease) (HCC) Shortness of breath improved.  Still with some rhonchi but improved. -Added LAMA/LABA/Pulmicort on 3/3. -Continue DuoNeb as needed -Incentive spirometry/OOB/PT/OT  Benign essential HTN Normotensive -Continue amlodipine -Continue holding lisinopril and HCTZ -P.o. hydralazine as needed  Drug induced constipation Schedule Senokot-S twice daily x4 doses followed by twice  daily as needed Continue MiraLAX twice daily as needed  Hyponatremia Recheck in the morning. Continue holding lisinopril and HCTZ.  Hypomagnesemia Resolved.  Hypokalemia Resolved.  Obesity (BMI 30-39.9) BMI 34.97 from previous weight.  Patient also reports unintentional weight loss. -Need current  weight.  HLD (hyperlipidemia) Continue home statin  Tobacco use Encouraged tobacco cessation. -Nicotine patch as needed  DM2 (diabetes mellitus, type 2) ([place]) Controlled.  A1c 5.9%. Recent Labs  Lab 05/06/21 1207 05/06/21 1637 05/06/21 2020 05/07/21 0737 05/07/21 1145  GLUCAP 82 83 98 88 107*  -CBG within normal range.  Did not require insulin.  Discontinue CBG monitoring and SSI.     DVT prophylaxis:  heparin injection 5,000 Units Start: 05/04/21 2200  Code Status: Full code Family Communication: Updated patient's wife and daughter at bedside on 3/6. Level of care: Med-Surg Status is: Inpatient The patient will remain inpatient because: Need for IR kyphoplasty for T11 and T12 compression fracture with significant height loss and significant back pain     Final disposition: Home    Consultants:  Oncology IR   Sch Meds:  Scheduled Meds:  amLODipine  10 mg Oral Daily   aspirin EC  81 mg Oral Daily   atorvastatin  10 mg Oral Daily   budesonide  0.5 mg Nebulization BID   docusate sodium  100 mg Oral BID   heparin  5,000 Units Subcutaneous Q8H   umeclidinium-vilanterol  1 puff Inhalation Daily   Continuous Infusions:  methocarbamol (ROBAXIN) IV     PRN Meds:.acetaminophen, guaiFENesin, hydrALAZINE, ipratropium-albuterol, methocarbamol (ROBAXIN) IV, metoprolol tartrate, morphine injection, ondansetron **OR** ondansetron (ZOFRAN) IV, oxyCODONE, polyethylene glycol, [COMPLETED] senna-docusate **FOLLOWED BY** senna-docusate, traZODone  Antimicrobials: Anti-infectives (From admission, onward)    None        I have personally reviewed the following labs and images: CBC: Recent Labs  Lab 05/04/21 1100 05/05/21 0515 05/06/21 0503 05/07/21 0545 05/08/21 0516 05/09/21 0509 05/10/21 0550  WBC 15.8*   < > 11.6* 13.9* 15.4* 14.1* 12.8*  NEUTROABS 10.3*  --   --   --   --   --   --   HGB 15.3   < > 14.2 12.9* 12.9* 13.1 12.9*  HCT 47.4   < > 45.3 40.6  41.0 40.9 39.5  MCV 88.8   < > 91.9 90.4 90.3 90.1 89.4  PLT 428*   < > 394 369 367 352 341   < > = values in this interval not displayed.   BMP &GFR Recent Labs  Lab 05/05/21 0515 05/06/21 0503 05/07/21 0545 05/08/21 0516 05/09/21 0509  NA 132* 137 134* 131* 132*  K 3.5 3.6 3.3* 3.9 4.0  CL 96* 100 98 97* 98  CO2 30 30 28 28 27   GLUCOSE 91 100* 87 111* 111*  BUN 19 15 13 12 14   CREATININE 1.49* 1.01 0.87 0.96 1.08  CALCIUM 8.6* 8.9 8.8* 8.6* 8.9  MG 1.8 1.8 1.5* 1.7 1.8  PHOS  --  3.8 3.8 3.4 4.7*   Estimated Creatinine Clearance: 67 mL/min (by C-G formula based on SCr of 1.08 mg/dL). Liver & Pancreas: Recent Labs  Lab 05/04/21 1100 05/05/21 0515 05/06/21 0503 05/07/21 0545 05/08/21 0516 05/09/21 0509  AST 24 19  --   --   --   --   ALT 28 21  --   --   --   --   ALKPHOS 142* 108  --   --   --   --   BILITOT  0.8 0.6  --   --   --   --   PROT 8.3* 7.1  --   --   --   --   ALBUMIN 3.8 3.0* 3.1* 2.9* 3.0* 3.0*   No results for input(s): LIPASE, AMYLASE in the last 168 hours. No results for input(s): AMMONIA in the last 168 hours. Diabetic: No results for input(s): HGBA1C in the last 72 hours.  Recent Labs  Lab 05/08/21 0740 05/08/21 1123 05/08/21 1725 05/08/21 2151 05/09/21 0803  GLUCAP 100* 126* 88 109* 99   Cardiac Enzymes: Recent Labs  Lab 05/06/21 0503  CKTOTAL 79   No results for input(s): PROBNP in the last 8760 hours. Coagulation Profile: Recent Labs  Lab 05/06/21 0503  INR 1.0   Thyroid Function Tests: No results for input(s): TSH, T4TOTAL, FREET4, T3FREE, THYROIDAB in the last 72 hours.  Lipid Profile: No results for input(s): CHOL, HDL, LDLCALC, TRIG, CHOLHDL, LDLDIRECT in the last 72 hours. Anemia Panel: No results for input(s): VITAMINB12, FOLATE, FERRITIN, TIBC, IRON, RETICCTPCT in the last 72 hours. Urine analysis: No results found for: COLORURINE, APPEARANCEUR, LABSPEC, PHURINE, GLUCOSEU, HGBUR, BILIRUBINUR, KETONESUR,  PROTEINUR, UROBILINOGEN, NITRITE, LEUKOCYTESUR Sepsis Labs: Invalid input(s): PROCALCITONIN, [place]  Microbiology: Recent Results (from the past 240 hour(s))  Resp Panel by RT-PCR (Flu A&B, Covid) Nasopharyngeal Swab     Status: None   Collection Time: 05/04/21  5:01 PM   Specimen: Nasopharyngeal Swab; Nasopharyngeal(NP) swabs in vial transport medium  Result Value Ref Range Status   SARS Coronavirus 2 by RT PCR NEGATIVE NEGATIVE Final    Comment: (NOTE) SARS-CoV-2 target nucleic acids are NOT DETECTED.  The SARS-CoV-2 RNA is generally detectable in upper respiratory specimens during the acute phase of infection. The lowest concentration of SARS-CoV-2 viral copies this assay can detect is 138 copies/mL. A negative result does not preclude SARS-Cov-2 infection and should not be used as the sole basis for treatment or other patient management decisions. A negative result may occur with  improper specimen collection/handling, submission of specimen other than nasopharyngeal swab, presence of viral mutation(s) within the areas targeted by this assay, and inadequate number of viral copies(<138 copies/mL). A negative result must be combined with clinical observations, patient history, and epidemiological information. The expected result is Negative.  Fact Sheet for Patients:  EntrepreneurPulse.com.au  Fact Sheet for Healthcare Providers:  IncredibleEmployment.be  This test is no t yet approved or cleared by the [place] FDA and  has been authorized for detection and/or diagnosis of SARS-CoV-2 by FDA under an Emergency Use Authorization (EUA). This EUA will remain  in effect (meaning this test can be used) for the duration of the COVID-19 declaration under Section 564(b)(1) of the Act, 21 U.S.C.section 360bbb-3(b)(1), unless the authorization is terminated  or revoked sooner.       Influenza A by PCR NEGATIVE NEGATIVE Final   Influenza  B by PCR NEGATIVE NEGATIVE Final    Comment: (NOTE) The Xpert Xpress SARS-CoV-2/FLU/RSV plus assay is intended as an aid in the diagnosis of influenza from Nasopharyngeal swab specimens and should not be used as a sole basis for treatment. Nasal washings and aspirates are unacceptable for Xpert Xpress SARS-CoV-2/FLU/RSV testing.  Fact Sheet for Patients: EntrepreneurPulse.com.au  Fact Sheet for Healthcare Providers: IncredibleEmployment.be  This test is not yet approved or cleared by the [place] FDA and has been authorized for detection and/or diagnosis of SARS-CoV-2 by FDA under an Emergency Use Authorization (EUA). This EUA will remain in effect (meaning  this test can be used) for the duration of the COVID-19 declaration under Section 564(b)(1) of the Act, 21 U.S.C. section 360bbb-3(b)(1), unless the authorization is terminated or revoked.  Performed at [facility], [place] [address], [place], [place] [address]     Radiology Studies: No results found.    Laiylah Roettger T. [place]  If 7PM-7AM, please contact night-coverage [email or web address] 05/10/2021, 2:39 PM

## 2021-05-10 NOTE — Progress Notes (Signed)
Occupational Therapy Treatment ?Patient Details ?Name: Nathan Nicholson ?MRN: [id number] ?DOB: [birth year] ?Today's Date: 05/10/2021 ? ? ?History of present illness Patient is a 66 year old male admitted from oncology while there for work up of leukocytosis. While in office patient having severe back pain. Patient had MRI by orthopedics in [place] revealing T12 cmopression fx. Pt admitted 05/04/21 for low back pain and plan for kyphoplasty once insurance approved. PMH: Felty syndrome status post splenectomy, COPD, tobacco use, HTN, rheumatoid arthritis, DMII ?  ?OT comments ? Patient was educated on LB dressing with total hip kit sitting EOB. Patient verbalized and demonstrated understanding with SUP. Patient completed toileting tasks at [facility] level with SUP with cues for maintaining back precautions. Patient would continue to benefit from skilled OT services at this time while admitted to address noted deficits in order to improve overall safety and independence in ADLs.  ?  ? ?Recommendations for follow up therapy are one component of a multi-disciplinary discharge planning process, led by the attending physician.  Recommendations may be updated based on patient status, additional functional criteria and insurance authorization. ?   ?Follow Up Recommendations ? No OT follow up  ?  ?Assistance Recommended at Discharge Intermittent Supervision/Assistance  ?Patient can return home with the following ? A little help with bathing/dressing/bathroom;Assistance with cooking/housework ?  ?Equipment Recommendations ? None recommended by OT  ?  ?Recommendations for Other Services   ? ?  ?Precautions / Restrictions Precautions ?Precautions: Back ?Precaution Comments: T12 compression fracture - back precautions for comfort ?Restrictions ?Weight Bearing Restrictions: No  ? ? ?  ? ?Mobility Bed Mobility ?Overal bed mobility: Needs Assistance ?Bed Mobility: Sidelying to Sit, Sit to Sidelying, Rolling ?Rolling: Supervision ?Sidelying to  sit: Min assist ?  ?  ?  ?General bed mobility comments: cues for log roll technique ?  ? ?Transfers ?  ?  ?  ?  ?  ?  ?  ?  ?  ?  ?  ?  ?Balance Overall balance assessment: Needs assistance ?Sitting-balance support: Feet supported ?Sitting balance-Leahy Scale: Good ?  ?  ?Standing balance support: Bilateral upper extremity supported, No upper extremity supported ?Standing balance-Leahy Scale: Fair ?Standing balance comment: fair static, requires RW for gait ?  ?  ?  ?  ?  ?  ?  ?  ?  ?  ?  ?   ? ?ADL either performed or assessed with clinical judgement  ? ?ADL Overall ADL's : Needs assistance/impaired ?  ?  ?  ?  ?  ?  ?  ?  ?  ?  ?Lower Body Dressing: Modified independent;Sit to/from stand;Sitting/lateral leans ?Lower Body Dressing Details (indicate cue type and reason): patient was able to complete LB dressing tasks with AE after education on sequence of tasks. patient verbalized understanding. patient was educated on total hip kit. patient verbalized understanding reported having multiple reacher at home ?Toilet Transfer: Min guard;Ambulation;Rolling walker (2 wheels) ?Toilet Transfer Details (indicate cue type and reason): min G for safety ?Toileting- Clothing Manipulation and Hygiene: Sitting/lateral lean;Sit to/from stand;Min guard ?Toileting - Clothing Manipulation Details (indicate cue type and reason): with education on importance of participation in hygiene tasks. patient reproted wife would assist him later. nurse made aware. ?  ?  ?Functional mobility during ADLs: Min guard;Rolling walker (2 wheels) ?General ADL Comments: in room ?  ? ?Extremity/Trunk Assessment   ?  ?  ?  ?  ?  ? ?Vision   ?  ?  ?Perception   ?  ?  Praxis   ?  ? ?Cognition Arousal/Alertness: Awake/alert ?Behavior During Therapy: [facility] for tasks assessed/performed ?Overall Cognitive Status: Within Functional Limits for tasks assessed ?  ?  ?  ?  ?  ?  ?  ?  ?  ?  ?  ?  ?  ?  ?  ?  ?  ?  ?  ?   ?Exercises   ? ?  ?Shoulder Instructions    ? ? ?  ?General Comments    ? ? ?Pertinent Vitals/ Pain       Pain Assessment ?Pain Assessment: Faces ?Faces Pain Scale: Hurts a little bit ?Pain Location: back; also reports soreness/stiffness in knee ?Pain Descriptors / Indicators: Sore ?Pain Intervention(s): Limited activity within patient's tolerance, Monitored during session ? ?Home Living   ?  ?  ?  ?  ?  ?  ?  ?  ?  ?  ?  ?  ?  ?  ?  ?  ?  ?  ? ?  ?Prior Functioning/Environment    ?  ?  ?  ?   ? ?Frequency ? Min 2X/week  ? ? ? ? ?  ?Progress Toward Goals ? ?OT Goals(current goals can now be found in the care plan section) ? Progress towards OT goals: Progressing toward goals ? ?   ?Plan Discharge plan remains appropriate   ? ?Co-evaluation ? ? ?   ?  ?  ?  ?  ? ?  ?AM-PAC OT "6 Clicks" Daily Activity     ?Outcome Measure ? ? Help from another person eating meals?: None ?Help from another person taking care of personal grooming?: A Little ?Help from another person toileting, which includes using toliet, bedpan, or urinal?: A Little ?Help from another person bathing (including washing, rinsing, drying)?: A Little ?Help from another person to put on and taking off regular upper body clothing?: A Little ?Help from another person to put on and taking off regular lower body clothing?: A Little ?6 Click Score: 19 ? ?  ?End of Session Equipment Utilized During Treatment: Gait belt;Rolling walker (2 wheels) ? ?OT Visit Diagnosis: Pain;Unsteadiness on feet (R26.81) ?  ?Activity Tolerance Patient tolerated treatment well ?  ?Patient Left with call bell/phone within reach;in chair;with nursing/sitter in room ?  ?Nurse Communication Mobility status ?  ? ?   ? ?Time: 6720-9470 ?OT Time Calculation (min): 27 min ? ?Charges: OT General Charges ?$OT Visit: 1 Visit ?OT Treatments ?$Self Care/Home Management : 23-37 mins ? ?Nathan Nicholson OTR/L, MS ?Acute Rehabilitation Department ?Office# [phone] ?Pager# [phone] ? ? ?Nathan Nicholson ?05/10/2021, 1:42 PM ?

## 2021-05-11 DIAGNOSIS — G8929 Other chronic pain: Secondary | ICD-10-CM | POA: Diagnosis not present

## 2021-05-11 DIAGNOSIS — M545 Low back pain, unspecified: Secondary | ICD-10-CM | POA: Diagnosis not present

## 2021-05-11 LAB — CBC
HCT: 41.3 % (ref 39.0–52.0)
Hemoglobin: 12.9 g/dL — ABNORMAL LOW (ref 13.0–17.0)
MCH: 28.4 pg (ref 26.0–34.0)
MCHC: 31.2 g/dL (ref 30.0–36.0)
MCV: 91 fL (ref 80.0–100.0)
Platelets: 368 10*3/uL (ref 150–400)
RBC: 4.54 MIL/uL (ref 4.22–5.81)
RDW: 15.2 % (ref 11.5–15.5)
WBC: 14.6 10*3/uL — ABNORMAL HIGH (ref 4.0–10.5)
nRBC: 0 % (ref 0.0–0.2)

## 2021-05-11 NOTE — Progress Notes (Signed)
PROGRESS NOTE  Nathan Nicholson [id number] DOB: [birth year] DOA: 05/04/2021 PCP: Earlyne Iba, NP  HPI/Recap of past 31 hours:  66 year old M with PMH of Felty syndrome s/p splenectomy, RA on MTX, COPD, HTN, DM-2, T11/T12 compression fracture, tobacco use disorder and chronic leukocytosis followed by oncology, Dr. Benay Spice admitted to the hospital from oncology office for low back pain evaluation and treatment.  He also had about 30 pounds unintentional weight loss in several months.   MRI lumbar spine with T11 and T12 compression fracture with at least 50% height loss in both.  IR kyphoplasty pending insurance approval.   05/11/2021: Reports severe pain when he moves.  Kyphoplasty pending insurance approval.  Assessment/Plan: Principal Problem:   Low back pain Active Problems:   Unintentional weight loss   Leukocytosis   COPD (chronic obstructive pulmonary disease) (HCC)   AKI (acute kidney injury) ([facility])   Benign essential HTN   DM2 (diabetes mellitus, type 2) (HCC)   Tobacco use   HLD (hyperlipidemia)   Obesity (BMI 30-39.9)   Hypokalemia   Hypomagnesemia   Hyponatremia   Drug induced constipation  Low back pain Likely due to T11 and T12 compression fractures with 50% height loss as noted on MRI.  He has no radiculopathy, fever, bowel or bladder issues.  However, he reports about 30 pounds unintentional weight loss in several months.  No significant finding on last colonoscopy but about 10 years ago. -IR kyphoplasty pending insurance approval. -Continue pain control and bowel regimen -Continue PT/OT-no need identified.   Unintentional weight loss Patient reports about 30 pounds unintentional weight loss in several months.  Reportedly normal colonoscopy 10 years ago.  He is due for repeat.  He denies melena or hematochezia.  No respiratory status change other than his chronic COPD. -Defer further malignancy work-up to oncology -He says he is due for his routine  colonoscopy.   Leukocytosis Chronic likely from his splenectomy.  Followed by Dr. Benay Spice.  -Continue monitoring   AKI (acute kidney injury) [facility])          Recent Labs    08/10/20 0858 08/17/20 1228 08/18/20 0458 05/04/21 1100 05/05/21 0515 05/06/21 0503 05/07/21 0545  BUN 13 9 11 10 19 15 13   CREATININE 0.78 0.86 0.92 0.94 1.49* 1.01 0.87  Likely from dehydration, lisinopril and HCTZ.  Resolved. -Continue holding lisinopril and HCTZ. -Check intermittently     COPD (chronic obstructive pulmonary disease) (HCC) Shortness of breath improved.  Still with some rhonchi but improved. -Added LAMA/LABA/Pulmicort on 3/3. -Continue DuoNeb as needed -Incentive spirometry/OOB/PT/OT   Benign essential HTN Normotensive -Continue amlodipine -Continue holding lisinopril and HCTZ -P.o. hydralazine as needed   Drug induced constipation Schedule Senokot-S twice daily x4 doses followed by twice daily as needed Continue MiraLAX twice daily as needed   Hyponatremia Recheck in the morning. Continue holding lisinopril and HCTZ.   Hypomagnesemia Resolved.   Hypokalemia Resolved.   Obesity (BMI 30-39.9) BMI 34.97 from previous weight.  Patient also reports unintentional weight loss. -Need current weight.   HLD (hyperlipidemia) Continue home statin   Tobacco use Encouraged tobacco cessation. -Nicotine patch as needed   DM2 (diabetes mellitus, type 2) ([place]) Controlled.  A1c 5.9%.        Recent Labs  Lab 05/06/21 1207 05/06/21 1637 05/06/21 2020 05/07/21 0737 05/07/21 1145  GLUCAP 82 83 98 88 107*  -CBG within normal range.  Did not require insulin.  Discontinue CBG monitoring and SSI.  DVT prophylaxis:  heparin injection 5,000 Units Start: 05/04/21 2200   Code Status: Full code Family Communication: Updated patient's wife and daughter      Status is: Inpatient Patient requires at least 2 midnights for further evaluation and treatment of present  condition.    Objective: Vitals:   05/10/21 2059 05/11/21 0538 05/11/21 0801 05/11/21 1427  BP: 126/81 106/73  120/82  Pulse: (!) 105 81  88  Resp: 17 18  18   Temp: 97.7 F (36.5 C) 97.9 F (36.6 C)  98 F (36.7 C)  TempSrc: Oral Oral  Oral  SpO2: 95% 92% 90% 94%  Weight:      Height:        Intake/Output Summary (Last 24 hours) at 05/11/2021 1552 Last data filed at 05/11/2021 0748 Gross per 24 hour  Intake 240 ml  Output 400 ml  Net -160 ml   Filed Weights   05/07/21 0430 05/08/21 1524  Weight: 91.9 kg 91.9 kg    Exam:  General: 66 y.o. year-old male well developed well nourished in no acute distress.  Alert and oriented x3. Cardiovascular: Regular rate and rhythm with no rubs or gallops.  No thyromegaly or JVD noted.   Respiratory: Clear to auscultation with no wheezes or rales. Good inspiratory effort. Abdomen: Soft nontender nondistended with normal bowel sounds x4 quadrants. Musculoskeletal: No lower extremity edema. 2/4 pulses in all 4 extremities. Skin: No ulcerative lesions noted or rashes, Psychiatry: Mood is appropriate for condition and setting   Data Reviewed: CBC: Recent Labs  Lab 05/07/21 0545 05/08/21 0516 05/09/21 0509 05/10/21 0550 05/11/21 0533  WBC 13.9* 15.4* 14.1* 12.8* 14.6*  HGB 12.9* 12.9* 13.1 12.9* 12.9*  HCT 40.6 41.0 40.9 39.5 41.3  MCV 90.4 90.3 90.1 89.4 91.0  PLT 369 367 352 341 [id number]   Basic Metabolic Panel: Recent Labs  Lab 05/05/21 0515 05/06/21 0503 05/07/21 0545 05/08/21 0516 05/09/21 0509  NA 132* 137 134* 131* 132*  K 3.5 3.6 3.3* 3.9 4.0  CL 96* 100 98 97* 98  CO2 30 30 28 28 27   GLUCOSE 91 100* 87 111* 111*  BUN 19 15 13 12 14   CREATININE 1.49* 1.01 0.87 0.96 1.08  CALCIUM 8.6* 8.9 8.8* 8.6* 8.9  MG 1.8 1.8 1.5* 1.7 1.8  PHOS  --  3.8 3.8 3.4 4.7*   GFR: Estimated Creatinine Clearance: 67 mL/min (by C-G formula based on SCr of 1.08 mg/dL). Liver Function Tests: Recent Labs  Lab 05/05/21 0515  05/06/21 0503 05/07/21 0545 05/08/21 0516 05/09/21 0509  AST 19  --   --   --   --   ALT 21  --   --   --   --   ALKPHOS 108  --   --   --   --   BILITOT 0.6  --   --   --   --   PROT 7.1  --   --   --   --   ALBUMIN 3.0* 3.1* 2.9* 3.0* 3.0*   No results for input(s): LIPASE, AMYLASE in the last 168 hours. No results for input(s): AMMONIA in the last 168 hours. Coagulation Profile: Recent Labs  Lab 05/06/21 0503  INR 1.0   Cardiac Enzymes: Recent Labs  Lab 05/06/21 0503  CKTOTAL 79   BNP (last 3 results) No results for input(s): PROBNP in the last 8760 hours. HbA1C: No results for input(s): HGBA1C in the last 72 hours. CBG: Recent Labs  Lab 05/08/21 0740  05/08/21 1123 05/08/21 1725 05/08/21 2151 05/09/21 0803  GLUCAP 100* 126* 88 109* 99   Lipid Profile: No results for input(s): CHOL, HDL, LDLCALC, TRIG, CHOLHDL, LDLDIRECT in the last 72 hours. Thyroid Function Tests: No results for input(s): TSH, T4TOTAL, FREET4, T3FREE, THYROIDAB in the last 72 hours. Anemia Panel: No results for input(s): VITAMINB12, FOLATE, FERRITIN, TIBC, IRON, RETICCTPCT in the last 72 hours. Urine analysis: No results found for: COLORURINE, APPEARANCEUR, LABSPEC, PHURINE, GLUCOSEU, HGBUR, BILIRUBINUR, KETONESUR, PROTEINUR, UROBILINOGEN, NITRITE, LEUKOCYTESUR Sepsis Labs: @LABRCNTIP (procalcitonin:4,lacticidven:4)  ) Recent Results (from the past 240 hour(s))  Resp Panel by RT-PCR (Flu A&B, Covid) Nasopharyngeal Swab     Status: None   Collection Time: 05/04/21  5:01 PM   Specimen: Nasopharyngeal Swab; Nasopharyngeal(NP) swabs in vial transport medium  Result Value Ref Range Status   SARS Coronavirus 2 by RT PCR NEGATIVE NEGATIVE Final    Comment: (NOTE) SARS-CoV-2 target nucleic acids are NOT DETECTED.  The SARS-CoV-2 RNA is generally detectable in upper respiratory specimens during the acute phase of infection. The lowest concentration of SARS-CoV-2 viral copies this assay can  detect is 138 copies/mL. A negative result does not preclude SARS-Cov-2 infection and should not be used as the sole basis for treatment or other patient management decisions. A negative result may occur with  improper specimen collection/handling, submission of specimen other than nasopharyngeal swab, presence of viral mutation(s) within the areas targeted by this assay, and inadequate number of viral copies(<138 copies/mL). A negative result must be combined with clinical observations, patient history, and epidemiological information. The expected result is Negative.  Fact Sheet for Patients:  EntrepreneurPulse.com.au  Fact Sheet for Healthcare Providers:  IncredibleEmployment.be  This test is no t yet approved or cleared by the [place] FDA and  has been authorized for detection and/or diagnosis of SARS-CoV-2 by FDA under an Emergency Use Authorization (EUA). This EUA will remain  in effect (meaning this test can be used) for the duration of the COVID-19 declaration under Section 564(b)(1) of the Act, 21 U.S.C.section 360bbb-3(b)(1), unless the authorization is terminated  or revoked sooner.       Influenza A by PCR NEGATIVE NEGATIVE Final   Influenza B by PCR NEGATIVE NEGATIVE Final    Comment: (NOTE) The Xpert Xpress SARS-CoV-2/FLU/RSV plus assay is intended as an aid in the diagnosis of influenza from Nasopharyngeal swab specimens and should not be used as a sole basis for treatment. Nasal washings and aspirates are unacceptable for Xpert Xpress SARS-CoV-2/FLU/RSV testing.  Fact Sheet for Patients: EntrepreneurPulse.com.au  Fact Sheet for Healthcare Providers: IncredibleEmployment.be  This test is not yet approved or cleared by the [place] FDA and has been authorized for detection and/or diagnosis of SARS-CoV-2 by FDA under an Emergency Use Authorization (EUA). This EUA will remain in  effect (meaning this test can be used) for the duration of the COVID-19 declaration under Section 564(b)(1) of the Act, 21 U.S.C. section 360bbb-3(b)(1), unless the authorization is terminated or revoked.  Performed at [facility], [place] [address] [place], [place] [address]       Studies: No results found.  Scheduled Meds:  amLODipine  10 mg Oral Daily   aspirin EC  81 mg Oral Daily   atorvastatin  10 mg Oral Daily   budesonide  0.5 mg Nebulization BID   docusate sodium  100 mg Oral BID   heparin  5,000 Units Subcutaneous Q8H   umeclidinium-vilanterol  1 puff Inhalation Daily    Continuous Infusions:  methocarbamol (ROBAXIN) IV       LOS: 6 days     [name], MD [facility] Pager [phone]  If 7PM-7AM, please contact night-coverage [email or web address] [facility] 05/11/2021, 3:52 PM

## 2021-05-11 NOTE — Progress Notes (Signed)
Physical Therapy Treatment ?Patient Details ?Name: Nathan Nicholson ?MRN: [id number] ?DOB: 22-Dec-1955 ?Today's Date: 05/11/2021 ? ? ?History of Present Illness Patient is a 66 year old male admitted from oncology while there for work up of leukocytosis. While in office patient having severe back pain. Patient had MRI by orthopedics in [place] revealing T12 cmopression fx. Pt admitted 05/04/21 for low back pain and plan for kyphoplasty once insurance approved. PMH: Felty syndrome status post splenectomy, COPD, tobacco use, HTN, rheumatoid arthritis, DMII ? ?  ?PT Comments  ? ? Pt with 7/10 pain, but able to ambulate in the hallway with RW. Recommended to mobility team, as pt reports his other joints are getting stiff.  ?Recommendations for follow up therapy are one component of a multi-disciplinary discharge planning process, led by the attending physician.  Recommendations may be updated based on patient status, additional functional criteria and insurance authorization. ? ?Follow Up Recommendations ? No PT follow up ?  ?  ?Assistance Recommended at Discharge Set up Supervision/Assistance  ?Patient can return home with the following A little help with walking and/or transfers;A little help with bathing/dressing/bathroom;Assistance with cooking/housework;Help with stairs or ramp for entrance ?  ?Equipment Recommendations ? None recommended by PT  ?  ?Recommendations for Other Services   ? ? ?  ?Precautions / Restrictions Precautions ?Precautions: Back ?Precaution Comments: T12 compression fracture - back precautions for comfort ?Restrictions ?Weight Bearing Restrictions: No  ?  ? ?Mobility ? Bed Mobility ?Overal bed mobility: Needs Assistance ?Bed Mobility: Sidelying to Sit, Sit to Sidelying, Rolling ?Rolling: Supervision ?Sidelying to sit: Min guard ?  ?  ?Sit to sidelying: Min assist ?General bed mobility comments: A for legs with sit to sidelying ?  ? ?Transfers ?Overall transfer level: Needs assistance ?Equipment used:  Rolling walker (2 wheels) ?Transfers: Sit to/from Stand ?Sit to Stand: Min assist ?  ?  ?  ?  ?  ?General transfer comment: cues for hand placement. Stood from both bed and toilet ?  ? ?Ambulation/Gait ?Ambulation/Gait assistance: Min guard ?Gait Distance (Feet): 120 Feet ?Assistive device: Rolling walker (2 wheels) ?Gait Pattern/deviations: Step-through pattern, Trunk flexed ?  ?  ?  ?General Gait Details: cues for positioning in RW ? ? ?Stairs ?  ?  ?  ?  ?  ? ? ?Wheelchair Mobility ?  ? ?Modified Rankin (Stroke Patients Only) ?  ? ? ?  ?Balance   ?Sitting-balance support: Feet supported ?Sitting balance-Leahy Scale: Good ?  ?  ?Standing balance support: Bilateral upper extremity supported, No upper extremity supported ?Standing balance-Leahy Scale: Fair ?  ?  ?  ?  ?  ?  ?  ?  ?  ?  ?  ?  ?  ? ?  ?Cognition Arousal/Alertness: Awake/alert ?Behavior During Therapy: [facility] for tasks assessed/performed ?Overall Cognitive Status: Within Functional Limits for tasks assessed ?  ?  ?  ?  ?  ?  ?  ?  ?  ?  ?  ?  ?  ?  ?  ?  ?  ?  ?  ? ?  ?Exercises   ? ?  ?General Comments General comments (skin integrity, edema, etc.): o2 92% on RA ?  ?  ? ?Pertinent Vitals/Pain Pain Assessment ?Pain Assessment: 0-10 ?Pain Score: 7  ?Pain Location: back ?Pain Descriptors / Indicators: Sore  ? ? ?Home Living   ?  ?  ?  ?  ?  ?  ?  ?  ?  ?   ?  ?  Prior Function    ?  ?  ?   ? ?PT Goals (current goals can now be found in the care plan section) Acute Rehab PT Goals ?Patient Stated Goal: to get moving ?PT Goal Formulation: With patient ?Time For Goal Achievement: 05/19/21 ?Potential to Achieve Goals: Good ?Progress towards PT goals: Progressing toward goals ? ?  ?Frequency ? ? ? Min 3X/week ? ? ? ?  ?PT Plan Current plan remains appropriate  ? ? ?Co-evaluation   ?  ?  ?  ?  ? ?  ?AM-PAC PT "6 Clicks" Mobility   ?Outcome Measure ? Help needed turning from your back to your side while in a flat bed without using bedrails?: A Little ?Help needed  moving from lying on your back to sitting on the side of a flat bed without using bedrails?: A Little ?Help needed moving to and from a bed to a chair (including a wheelchair)?: A Little ?Help needed standing up from a chair using your arms (e.g., wheelchair or bedside chair)?: A Little ?Help needed to walk in hospital room?: A Little ?Help needed climbing 3-5 steps with a railing? : A Little ?6 Click Score: 18 ? ?  ?End of Session Equipment Utilized During Treatment: Gait belt ?Activity Tolerance: Patient tolerated treatment well ?Patient left: in bed;with call bell/phone within reach ?  ?PT Visit Diagnosis: Other symptoms and signs involving the nervous system (R29.898);Unsteadiness on feet (R26.81);Pain ?  ? ? ?Time: 1210-1230 ?PT Time Calculation (min) (ACUTE ONLY): 20 min ? ?Charges:  $Gait Training: 8-22 mins          ?          ? ?Santiago Glad L. Tamala Julian, PT ? ?05/11/2021 ? ? ? ?[name] ?05/11/2021, 12:56 PM ? ?

## 2021-05-11 NOTE — Progress Notes (Signed)
? ?  Brief Note: ? ?Insurance approval has not yet been acquired, though was submitted 05/06/21.  It will likely be next week before approval and thus KP can take place. ? ?Electronically Signed: ?Devante Capano, PA ?05/11/2021, 12:46 PM ? ? ? ? ? ? ? ?

## 2021-05-12 DIAGNOSIS — M545 Low back pain, unspecified: Secondary | ICD-10-CM | POA: Diagnosis not present

## 2021-05-12 DIAGNOSIS — G8929 Other chronic pain: Secondary | ICD-10-CM | POA: Diagnosis not present

## 2021-05-12 LAB — BASIC METABOLIC PANEL WITH GFR
Anion gap: 9 (ref 5–15)
BUN: 12 mg/dL (ref 8–23)
CO2: 24 mmol/L (ref 22–32)
Calcium: 8.9 mg/dL (ref 8.9–10.3)
Chloride: 98 mmol/L (ref 98–111)
Creatinine, Ser: 0.86 mg/dL (ref 0.61–1.24)
GFR, Estimated: >60 mL/min
Glucose, Bld: 122 mg/dL — ABNORMAL HIGH (ref 70–99)
Potassium: 3.6 mmol/L (ref 3.5–5.1)
Sodium: 131 mmol/L — ABNORMAL LOW (ref 135–145)

## 2021-05-12 LAB — CBC
HCT: 40.6 % (ref 39.0–52.0)
Hemoglobin: 12.9 g/dL — ABNORMAL LOW (ref 13.0–17.0)
MCH: 28.4 pg (ref 26.0–34.0)
MCHC: 31.8 g/dL (ref 30.0–36.0)
MCV: 89.4 fL (ref 80.0–100.0)
Platelets: 372 10*3/uL (ref 150–400)
RBC: 4.54 MIL/uL (ref 4.22–5.81)
RDW: 15.1 % (ref 11.5–15.5)
WBC: 12.1 10*3/uL — ABNORMAL HIGH (ref 4.0–10.5)
nRBC: 0 % (ref 0.0–0.2)

## 2021-05-12 LAB — MAGNESIUM: Magnesium: 1.8 mg/dL (ref 1.7–2.4)

## 2021-05-12 NOTE — Care Management Important Message (Signed)
Important Message ? ?Patient Details IM Letter placed in Patients room. ?Name: Nathan Nicholson ?MRN: [id number] ?Date of Birth: September 15, 1955 ? ? ?Medicare Important Message Given:  Yes ? ? ? ? ?Caren Macadam ?05/12/2021, 11:21 AM ?

## 2021-05-12 NOTE — Progress Notes (Signed)
Mobility Specialist - Progress Note ? ? ? 05/12/21 1149  ?Oxygen Therapy  ?O2 Device Room Air  ?Mobility  ?Activity Ambulated with assistance in hallway;Ambulated with assistance in room  ?Level of Assistance Minimal assist, patient does 75% or more  ?Assistive Device Front wheel walker  ?Distance Ambulated (ft) 120 ft  ?Activity Response Tolerated well  ?$Mobility charge 1 Mobility  ? ?Pt agreeable to mobilize upon entry. Practiced log roll when transitioning from supine position to sitting EOB. Pt attempted to sit up himself without assistance and refused to reconnect to O2. Pt required Min A to stand from bed, once up pt was able to ambulate ~120 ft. Pt reported 7/10 pain, but refused pain medication. At EOS, pt returned to bed and was left with call bell at side and awaiting for family.  ? ?Nathan Nicholson S. ?Mobility Specialist ?Acute Rehabilitation Services ?Phone: [phone] ?05/12/21, 11:52 AM ? ? ? ? ?

## 2021-05-12 NOTE — Progress Notes (Signed)
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 Benay Spice admitted to the hospital from oncology office for low back pain evaluation and treatment.  He also had about 30 pounds unintentional weight loss in several months.   MRI lumbar spine with T11 and T12 compression fracture with at least 50% height loss in both.  IR kyphoplasty pending insurance approval.   05/12/2021: Patient was seen at his bedside.  Reports significant back pain with movement.  Insurance approval is still pending.  Assessment/Plan: Principal Problem:   Low back pain Active Problems:   Unintentional weight loss   Leukocytosis   COPD (chronic obstructive pulmonary disease) (HCC)   AKI (acute kidney injury) ([place])   Benign essential HTN   DM2 (diabetes mellitus, type 2) (HCC)   Tobacco use   HLD (hyperlipidemia)   Obesity (BMI 30-39.9)   Hypokalemia   Hypomagnesemia   Hyponatremia   Drug induced constipation  Low back pain Likely due to T11 and T12 compression fractures with 50% height loss as noted on MRI 05/05/2021.  He has no radiculopathy, fever, bowel or bladder issues.  However, he reports about 30 pounds unintentional weight loss in several months.  No significant finding on last colonoscopy but about 10 years ago. -IR kyphoplasty pending insurance approval. -Continue pain control and bowel regimen -Continue PT/OT-no need identified.   Unintentional weight loss Patient reports about 30 pounds unintentional weight loss in several months.  Reportedly normal colonoscopy 10 years ago.  He is due for repeat.  He denies melena or hematochezia.  No respiratory status change other than his chronic COPD. -Defer further malignancy work-up to  oncology -He says he is due for his routine colonoscopy.   Resolving leukocytosis Chronic likely from his splenectomy.   Followed by Dr. Benay Spice.    Resolved AKI (acute kidney injury) ([place]) BPs are soft, continue holding off home lisinopril and HCTZ.    COPD (chronic obstructive pulmonary disease) (HCC) Shortness of breath improved.  Still with some rhonchi but improved. Continue LAMA/LABA/Pulmicort on 3/3. -Continue DuoNeb as needed -Continue incentive spirometry/OOB/PT/OT   Benign essential HTN Normotensive on 10 mg Norvasc. -Continue holding lisinopril and HCTZ   Drug induced constipation Schedule Senokot-S twice daily x4 doses followed by twice daily as needed Continue MiraLAX twice daily as needed   Mild hyponatremia Serum sodium 131 Continue holding lisinopril and HCTZ.   Resolved post repletion: Hypomagnesemia Serum magnesium 1.8.   Resolved post repletion: Hypokalemia Serum potassium 3.6   Obesity (BMI 30-39.9) BMI 34.97 from previous weight.  Patient also reports unintentional weight loss. -Need current weight.   HLD (hyperlipidemia) Continue home statin   Tobacco use Encouraged tobacco cessation. -Nicotine patch as needed   Prediabetes Controlled.  A1c 5.9%.  On 05/04/2021 Avoid hypoglycemia         DVT prophylaxis:  heparin injection 5,000 Units Start: 05/04/21 2200   Code Status: Full code Family Communication: Updated patient's wife and daughter      Status is: Inpatient Patient requires at least 2 midnights for further evaluation and treatment of present condition.    Objective: Vitals:   05/12/21 0746 05/12/21 0748 05/12/21 0943 05/12/21 1442  BP:   115/75 111/74  Pulse:    85  Resp:  17  Temp:    98.6 F (37 C)  TempSrc:    Oral  SpO2: 96% 96%  95%  Weight:      Height:        Intake/Output Summary (Last 24 hours) at 05/12/2021 1542 Last data filed at 05/12/2021 1445 Gross per 24 hour  Intake 240 ml  Output 1000 ml  Net  -760 ml   Filed Weights   05/07/21 0430 05/08/21 1524  Weight: 91.9 kg 91.9 kg    Exam:  General: 66 y.o. year-old male with development nourished in no acute stress.  He is alert oriented x3.   Cardiovascular: Regular rate and rhythm no rubs or gallops.   Respiratory: Clear to auscultation no wheezes or rales.   Abdomen: Soft nontender bowel sounds present. Musculoskeletal: No lower extremity edema bilaterally.   Skin: No ulcerative lesions noted. Psychiatry: Mood is appropriate for condition and setting.   Data Reviewed: CBC: Recent Labs  Lab 05/08/21 0516 05/09/21 0509 05/10/21 0550 05/11/21 0533 05/12/21 0504  WBC 15.4* 14.1* 12.8* 14.6* 12.1*  HGB 12.9* 13.1 12.9* 12.9* 12.9*  HCT 41.0 40.9 39.5 41.3 40.6  MCV 90.3 90.1 89.4 91.0 89.4  PLT 367 352 341 368 [id number]   Basic Metabolic Panel: Recent Labs  Lab 05/06/21 0503 05/07/21 0545 05/08/21 0516 05/09/21 0509 05/12/21 0504  NA 137 134* 131* 132* 131*  K 3.6 3.3* 3.9 4.0 3.6  CL 100 98 97* 98 98  CO2 30 28 28 27 24   GLUCOSE 100* 87 111* 111* 122*  BUN 15 13 12 14 12   CREATININE 1.01 0.87 0.96 1.08 0.86  CALCIUM 8.9 8.8* 8.6* 8.9 8.9  MG 1.8 1.5* 1.7 1.8 1.8  PHOS 3.8 3.8 3.4 4.7*  --    GFR: Estimated Creatinine Clearance: 84.2 mL/min (by C-G formula based on SCr of 0.86 mg/dL). Liver Function Tests: Recent Labs  Lab 05/06/21 0503 05/07/21 0545 05/08/21 0516 05/09/21 0509  ALBUMIN 3.1* 2.9* 3.0* 3.0*   No results for input(s): LIPASE, AMYLASE in the last 168 hours. No results for input(s): AMMONIA in the last 168 hours. Coagulation Profile: Recent Labs  Lab 05/06/21 0503  INR 1.0   Cardiac Enzymes: Recent Labs  Lab 05/06/21 0503  CKTOTAL 79   BNP (last 3 results) No results for input(s): PROBNP in the last 8760 hours. HbA1C: No results for input(s): HGBA1C in the last 72 hours. CBG: Recent Labs  Lab 05/08/21 0740 05/08/21 1123 05/08/21 1725 05/08/21 2151 05/09/21 0803  GLUCAP  100* 126* 88 109* 99   Lipid Profile: No results for input(s): CHOL, HDL, LDLCALC, TRIG, CHOLHDL, LDLDIRECT in the last 72 hours. Thyroid Function Tests: No results for input(s): TSH, T4TOTAL, FREET4, T3FREE, THYROIDAB in the last 72 hours. Anemia Panel: No results for input(s): VITAMINB12, FOLATE, FERRITIN, TIBC, IRON, RETICCTPCT in the last 72 hours. Urine analysis: No results found for: COLORURINE, APPEARANCEUR, LABSPEC, PHURINE, GLUCOSEU, HGBUR, BILIRUBINUR, KETONESUR, PROTEINUR, UROBILINOGEN, NITRITE, LEUKOCYTESUR Sepsis Labs: @LABRCNTIP (procalcitonin:4,lacticidven:4)  ) Recent Results (from the past 240 hour(s))  Resp Panel by RT-PCR (Flu A&B, Covid) Nasopharyngeal Swab     Status: None   Collection Time: 05/04/21  5:01 PM   Specimen: Nasopharyngeal Swab; Nasopharyngeal(NP) swabs in vial transport medium  Result Value Ref Range Status   SARS Coronavirus 2 by RT PCR NEGATIVE NEGATIVE Final    Comment: (NOTE) SARS-CoV-2 target nucleic acids are NOT DETECTED.  The SARS-CoV-2 RNA is generally detectable in upper respiratory specimens during the acute phase of  infection. The lowest concentration of SARS-CoV-2 viral copies this assay can detect is 138 copies/mL. A negative result does not preclude SARS-Cov-2 infection and should not be used as the sole basis for treatment or other patient management decisions. A negative result may occur with  improper specimen collection/handling, submission of specimen other than nasopharyngeal swab, presence of viral mutation(s) within the areas targeted by this assay, and inadequate number of viral copies(<138 copies/mL). A negative result must be combined with clinical observations, patient history, and epidemiological information. The expected result is Negative.  Fact Sheet for Patients:  EntrepreneurPulse.com.au  Fact Sheet for Healthcare Providers:  IncredibleEmployment.be  This test is no t yet  approved or cleared by the [place] FDA and  has been authorized for detection and/or diagnosis of SARS-CoV-2 by FDA under an Emergency Use Authorization (EUA). This EUA will remain  in effect (meaning this test can be used) for the duration of the COVID-19 declaration under Section 564(b)(1) of the Act, 21 U.S.C.section 360bbb-3(b)(1), unless the authorization is terminated  or revoked sooner.       Influenza A by PCR NEGATIVE NEGATIVE Final   Influenza B by PCR NEGATIVE NEGATIVE Final    Comment: (NOTE) The Xpert Xpress SARS-CoV-2/FLU/RSV plus assay is intended as an aid in the diagnosis of influenza from Nasopharyngeal swab specimens and should not be used as a sole basis for treatment. Nasal washings and aspirates are unacceptable for Xpert Xpress SARS-CoV-2/FLU/RSV testing.  Fact Sheet for Patients: EntrepreneurPulse.com.au  Fact Sheet for Healthcare Providers: IncredibleEmployment.be  This test is not yet approved or cleared by the [place] FDA and has been authorized for detection and/or diagnosis of SARS-CoV-2 by FDA under an Emergency Use Authorization (EUA). This EUA will remain in effect (meaning this test can be used) for the duration of the COVID-19 declaration under Section 564(b)(1) of the Act, 21 U.S.C. section 360bbb-3(b)(1), unless the authorization is terminated or revoked.  Performed at [facility], [place] [address] [place], [place] [address]       Studies: No results found.  Scheduled Meds:  amLODipine  10 mg Oral Daily   aspirin EC  81 mg Oral Daily   atorvastatin  10 mg Oral Daily   budesonide  0.5 mg Nebulization BID   docusate sodium  100 mg Oral BID   heparin  5,000 Units Subcutaneous Q8H   umeclidinium-vilanterol  1 puff Inhalation Daily    Continuous Infusions:  methocarbamol (ROBAXIN) IV       LOS: 7 days     [name], MD [facility] Pager  [phone]  If 7PM-7AM, please contact night-coverage [email or web address] Password [email or web address] 05/12/2021, 3:42 PM

## 2021-05-13 ENCOUNTER — Other Ambulatory Visit (HOSPITAL_COMMUNITY): Payer: Self-pay

## 2021-05-13 DIAGNOSIS — M545 Low back pain, unspecified: Secondary | ICD-10-CM | POA: Diagnosis not present

## 2021-05-13 DIAGNOSIS — G8929 Other chronic pain: Secondary | ICD-10-CM | POA: Diagnosis not present

## 2021-05-13 MED ORDER — NICOTINE 14 MG/24HR TD PT24
14.0000 mg | MEDICATED_PATCH | Freq: Every day | TRANSDERMAL | 3 refills | Status: DC
  Filled 2021-05-13: qty 28, 28d supply, fill #0

## 2021-05-13 MED ORDER — POLYETHYLENE GLYCOL 3350 17 G PO PACK
17.0000 g | PACK | Freq: Every day | ORAL | 0 refills | Status: DC | PRN
  Filled 2021-05-13: qty 14, 14d supply, fill #0

## 2021-05-13 MED ORDER — OXYCODONE HCL 5 MG PO TABS
5.0000 mg | ORAL_TABLET | Freq: Four times a day (QID) | ORAL | 0 refills | Status: DC | PRN
  Filled 2021-05-13: qty 20, 5d supply, fill #0

## 2021-05-13 MED ORDER — SENNOSIDES-DOCUSATE SODIUM 8.6-50 MG PO TABS
1.0000 | ORAL_TABLET | Freq: Two times a day (BID) | ORAL | 0 refills | Status: DC | PRN
  Filled 2021-05-13: qty 20, 10d supply, fill #0

## 2021-05-13 NOTE — Discharge Summary (Signed)
Discharge Summary  DAETON KLUTH ZOX:[id number] DOB: 20-Jan-1956  PCP: [name], NP  Admit date: 05/04/2021 Discharge date: 05/13/2021  Time spent: 35 minutes.  Recommendations for Outpatient Follow-up:  Follow-up with your primary care provider in 1 to 2 weeks. Follow-up with your hematologist/oncologist Dr. Truett Perna in 1 to 2 weeks. Take your medications as prescribed. Continue fall precautions.  Discharge Diagnoses:  Active Hospital Problems   Diagnosis Date Noted   Low back pain 05/04/2021    Priority: High   Unintentional weight loss 05/05/2021    Priority: High   Leukocytosis 05/04/2021    Priority: Medium    AKI (acute kidney injury) (HCC) 05/05/2021    Priority: Low   COPD (chronic obstructive pulmonary disease) (HCC) 05/04/2021    Priority: Low   Benign essential HTN 05/04/2021    Priority: 1.   Drug induced constipation 05/08/2021   Hypokalemia 05/07/2021   Hypomagnesemia 05/07/2021   Hyponatremia 05/07/2021   Obesity (BMI 30-39.9) 05/06/2021   DM2 (diabetes mellitus, type 2) (HCC) 05/04/2021   Tobacco use 05/04/2021   HLD (hyperlipidemia) 05/04/2021    Resolved Hospital Problems   Diagnosis Date Noted Date Resolved   Lower back pain 05/05/2021 05/06/2021    Discharge Condition: Stable.  Diet recommendation: Resume previous diet.  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 Truett Perna admitted to the hospital from oncology office for low back pain evaluation and treatment.  He also had about 30 pounds unintentional weight loss in several months.   MRI lumbar spine with T11 and T12 compression fracture with at least 50% height loss in both.  IR kyphoplasty  pending insurance approval.  Discussed with Dr. Truett Perna via secure chat on 05/12/2021, okay to discharge to home from medical oncology standpoint.   05/13/2021: Patient was seen at his bedside.  No acute events overnight.  He has no new complaints.  Hospital Course:  Principal Problem:   Low back pain Active Problems:   Unintentional weight loss   Leukocytosis   COPD (chronic obstructive pulmonary disease) (HCC)   AKI (acute kidney injury) (HCC)   Benign essential HTN   DM2 (diabetes mellitus, type 2) (HCC)   Tobacco use   HLD (hyperlipidemia)   Obesity (BMI 30-39.9)   Hypokalemia   Hypomagnesemia   Hyponatremia   Drug induced constipation  Low back pain Likely due to T11 and T12 compression fractures with 50% height loss as noted on MRI 05/05/2021.  He has no radiculopathy, fever, bowel or bladder issues.  However, he reports about 30 pounds unintentional weight loss in several months.  No significant finding on last colonoscopy but about 10 years ago. -IR kyphoplasty pending insurance approval. -Continue pain control and bowel regimen Continue fall precautions.   Unintentional weight loss Patient reports about 30 pounds unintentional weight loss in several months.  Reportedly normal colonoscopy 10 years ago.  He is due for repeat.  He denies melena or hematochezia.  No respiratory status change other than his chronic COPD. -Defer further malignancy work-up to oncology   Resolving leukocytosis Chronic likely from his splenectomy.   Followed by Dr. Truett Perna.    Resolved AKI (acute kidney injury) (  HCC) BPs are soft, continue holding off home lisinopril and HCTZ.    COPD (chronic obstructive pulmonary disease) (HCC) Not hypoxic, with saturation 96% on ambient air. Continue home regimen.   Benign essential HTN Normotensive on 10 mg Norvasc. BP stable 122/82 at the time of this dictation. -Continue holding home lisinopril and HCTZ   Drug induced constipation Continue bowel  regimen.   Mild hyponatremia Serum sodium 131 Continue holding lisinopril and HCTZ. Continue to encourage increase in oral intake.   Resolved post repletion: Hypomagnesemia Serum magnesium 1.8.   Resolved post repletion: Hypokalemia Serum potassium 3.6   Obesity (BMI 30-39.9) BMI 34.97 from previous weight.  Patient also reports unintentional weight loss.   HLD (hyperlipidemia) Continue home statin   Tobacco use Encouraged tobacco cessation. -Nicotine patch as needed   Prediabetes Controlled.  A1c 5.9%, on 05/04/2021 Avoid hypoglycemia          Code Status: Full code  Discharge Exam: BP 122/82 (BP Location: Left Arm)    Pulse 83    Temp 98 F (36.7 C) (Oral)    Resp 18    Ht 5\' 2"  (1.575 m)    Wt 91.9 kg    SpO2 96%    BMI 37.06 kg/m  General: 66 y.o. year-old male well developed well nourished in no acute distress.  Alert and oriented x3. Cardiovascular: Regular rate and rhythm with no rubs or gallops.  No thyromegaly or JVD noted.   Respiratory: Clear to auscultation with no wheezes or rales. Good inspiratory effort. Abdomen: Soft nontender nondistended with normal bowel sounds x4 quadrants. Musculoskeletal: No lower extremity edema.  Psychiatry: Mood is appropriate for condition and setting  Discharge Instructions You were cared for by a hospitalist during your hospital stay. If you have any questions about your discharge medications or the care you received while you were in the hospital after you are discharged, you can call the unit and asked to speak with the hospitalist on call if the hospitalist that took care of you is not available. Once you are discharged, your primary care physician will handle any further medical issues. Please note that NO REFILLS for any discharge medications will be authorized once you are discharged, as it is imperative that you return to your primary care physician (or establish a relationship with a primary care physician if you do not  have one) for your aftercare needs so that they can reassess your need for medications and monitor your lab values.   Allergies as of 05/13/2021   No Known Allergies      Medication List     STOP taking these medications    lisinopril-hydrochlorothiazide 20-12.5 MG tablet Commonly known as: ZESTORETIC   methotrexate 2.5 MG tablet Commonly known as: RHEUMATREX       TAKE these medications    albuterol 108 (90 Base) MCG/ACT inhaler Commonly known as: VENTOLIN HFA Inhale 2 puffs into the lungs every 4 (four) hours as needed for wheezing or shortness of breath.   aspirin 81 MG EC tablet Take 81 mg by mouth every morning.   atorvastatin 10 MG tablet Commonly known as: LIPITOR Take 10 mg by mouth every morning.   cyanocobalamin 1000 MCG/ML injection Commonly known as: (VITAMIN B-12) Inject 1,000 mcg into the muscle every 30 (thirty) days.   eucerin cream Apply 1 application topically as needed for dry skin.   folic acid 1 MG tablet Commonly known as: FOLVITE Take 1 mg by mouth every morning.   ipratropium-albuterol  0.5-2.5 (3) MG/3ML Soln Commonly known as: DUONEB Take 3 mLs by nebulization every 6 (six) hours as needed (shortness of breath/wheezing/asthma).   metFORMIN 500 MG 24 hr tablet Commonly known as: GLUCOPHAGE-XR 500 mg every morning.   nicotine 14 mg/24hr patch Commonly known as: NICODERM CQ - dosed in mg/24 hours Place 1 patch (14 mg total) onto the skin daily.   oxyCODONE 5 MG immediate release tablet Commonly known as: Oxy IR/ROXICODONE Take 1 tablet (5 mg total) by mouth every 6 (six) hours as needed for up to 5 days for breakthrough pain or severe pain.   OXYGEN Inhale 2 L into the lungs as needed.   polyethylene glycol 17 g packet Commonly known as: MIRALAX / GLYCOLAX Take 17 g by mouth daily as needed for mild constipation.   senna-docusate 8.6-50 MG tablet Commonly known as: Senokot-S Take 1 tablet by mouth 2 (two) times daily as  needed for moderate constipation.   Vitamin D (Ergocalciferol) 1.25 MG (50000 UNIT) Caps capsule Commonly known as: DRISDOL Take 50,000 Units by mouth every Monday.       No Known Allergies  Follow-up Information     [name], NP. Call today.   Specialty: Nurse Practitioner Why: Please call for a posthospital follow-up appointment. Contact information: [address] [place] [address] [phone]         Jake Bathe, MD .   Specialty: Cardiology Contact information: [phone] [address] [place] [address] [phone]                  The results of significant diagnostics from this hospitalization (including imaging, microbiology, ancillary and laboratory) are listed below for reference.    Significant Diagnostic Studies: MR Lumbar Spine W Wo Contrast  Result Date: 05/05/2021 CLINICAL DATA:  Compression fracture EXAM: MRI LUMBAR SPINE WITHOUT AND WITH CONTRAST TECHNIQUE: Multiplanar and multiecho pulse sequences of the lumbar spine were obtained without and with intravenous contrast. CONTRAST:  10mL GADAVIST GADOBUTROL 1 MMOL/ML IV SOLN COMPARISON:  None. FINDINGS: Segmentation:  Standard. Alignment:  Physiologic. Vertebrae: There are compression fractures of T11 and T12. T11 is incompletely visualized. There is approximately 50% height loss at both levels. There is 6 mm of retropulsion at T12. The intervening disc space is normal. Conus medullaris and cauda equina: Conus extends to the T12-L1 level. Conus and cauda equina appear normal. Paraspinal and other soft tissues: Negative Disc levels: T12-L1: Unremarkable. L1-L2: Normal disc space and facet joints. No spinal canal stenosis. No neural foraminal stenosis. L2-L3: Normal disc space and facet joints. No spinal canal stenosis. No neural foraminal stenosis. L3-L4: Normal disc space and facet joints. No spinal canal stenosis. No neural foraminal stenosis. L4-L5: Left asymmetric  disc bulge with small annular fissure in close proximity to the exiting left L4 nerve root. No spinal canal stenosis. No neural foraminal stenosis. L5-S1: Normal disc space and facet joints. No spinal canal stenosis. No neural foraminal stenosis. Visualized sacrum: Normal. IMPRESSION: 1. Acute or subacute compression fractures of T11 and T12 with approximately 50% height loss at both levels. 6 mm of retropulsion at T12 without associated stenosis. 2. L4-L5 left asymmetric disc bulge with small annular fissure in close proximity to the exiting left L4 nerve root. 3. Otherwise normal lumbar spine. Electronically Signed   By: Deatra Robinson M.D.   On: 05/05/2021 20:02    Microbiology: Recent Results (from the past 240 hour(s))  Resp Panel by RT-PCR (Flu A&B,  Covid) Nasopharyngeal Swab     Status: None   Collection Time: 05/04/21  5:01 PM   Specimen: Nasopharyngeal Swab; Nasopharyngeal(NP) swabs in vial transport medium  Result Value Ref Range Status   SARS Coronavirus 2 by RT PCR NEGATIVE NEGATIVE Final    Comment: (NOTE) SARS-CoV-2 target nucleic acids are NOT DETECTED.  The SARS-CoV-2 RNA is generally detectable in upper respiratory specimens during the acute phase of infection. The lowest concentration of SARS-CoV-2 viral copies this assay can detect is 138 copies/mL. A negative result does not preclude SARS-Cov-2 infection and should not be used as the sole basis for treatment or other patient management decisions. A negative result may occur with  improper specimen collection/handling, submission of specimen other than nasopharyngeal swab, presence of viral mutation(s) within the areas targeted by this assay, and inadequate number of viral copies(<138 copies/mL). A negative result must be combined with clinical observations, patient history, and epidemiological information. The expected result is Negative.  Fact Sheet for Patients:  BloggerCourse.com  Fact Sheet  for Healthcare Providers:  SeriousBroker.it  This test is no t yet approved or cleared by the [place] FDA and  has been authorized for detection and/or diagnosis of SARS-CoV-2 by FDA under an Emergency Use Authorization (EUA). This EUA will remain  in effect (meaning this test can be used) for the duration of the COVID-19 declaration under Section 564(b)(1) of the Act, 21 U.S.C.section 360bbb-3(b)(1), unless the authorization is terminated  or revoked sooner.       Influenza A by PCR NEGATIVE NEGATIVE Final   Influenza B by PCR NEGATIVE NEGATIVE Final    Comment: (NOTE) The Xpert Xpress SARS-CoV-2/FLU/RSV plus assay is intended as an aid in the diagnosis of influenza from Nasopharyngeal swab specimens and should not be used as a sole basis for treatment. Nasal washings and aspirates are unacceptable for Xpert Xpress SARS-CoV-2/FLU/RSV testing.  Fact Sheet for Patients: BloggerCourse.com  Fact Sheet for Healthcare Providers: SeriousBroker.it  This test is not yet approved or cleared by the [place] FDA and has been authorized for detection and/or diagnosis of SARS-CoV-2 by FDA under an Emergency Use Authorization (EUA). This EUA will remain in effect (meaning this test can be used) for the duration of the COVID-19 declaration under Section 564(b)(1) of the Act, 21 U.S.C. section 360bbb-3(b)(1), unless the authorization is terminated or revoked.  Performed at [facility], [address], [place], [place] [address]      Labs: Basic Metabolic Panel: Recent Labs  Lab 05/07/21 0545 05/08/21 0516 05/09/21 0509 05/12/21 0504  NA 134* 131* 132* 131*  K 3.3* 3.9 4.0 3.6  CL 98 97* 98 98  CO2 28 28 27 24   GLUCOSE 87 111* 111* 122*  BUN 13 12 14 12   CREATININE 0.87 0.96 1.08 0.86  CALCIUM 8.8* 8.6* 8.9 8.9  MG 1.5* 1.7 1.8 1.8  PHOS 3.8 3.4 4.7*  --    Liver  Function Tests: Recent Labs  Lab 05/07/21 0545 05/08/21 0516 05/09/21 0509  ALBUMIN 2.9* 3.0* 3.0*   No results for input(s): LIPASE, AMYLASE in the last 168 hours. No results for input(s): AMMONIA in the last 168 hours. CBC: Recent Labs  Lab 05/08/21 0516 05/09/21 0509 05/10/21 0550 05/11/21 0533 05/12/21 0504  WBC 15.4* 14.1* 12.8* 14.6* 12.1*  HGB 12.9* 13.1 12.9* 12.9* 12.9*  HCT 41.0 40.9 39.5 41.3 40.6  MCV 90.3 90.1 89.4 91.0 89.4  PLT 367 352 341 368 372   Cardiac Enzymes: No  results for input(s): CKTOTAL, CKMB, CKMBINDEX, TROPONINI in the last 168 hours. BNP: BNP (last 3 results) No results for input(s): BNP in the last 8760 hours.  ProBNP (last 3 results) No results for input(s): PROBNP in the last 8760 hours.  CBG: Recent Labs  Lab 05/08/21 0740 05/08/21 1123 05/08/21 1725 05/08/21 2151 05/09/21 0803  GLUCAP 100* 126* 88 109* 99       Signed:  [name], MD [facility] 05/13/2021, 1:21 PM

## 2021-05-13 NOTE — Progress Notes (Signed)
Occupational Therapy Treatment ?Patient Details ?Name: Nathan Nicholson ?MRN: [id number] ?DOB: Jun 07, 1955 ?Today's Date: 05/13/2021 ? ? ?History of present illness Patient is a 66 year old male admitted from oncology while there for work up of leukocytosis. While in office patient having severe back pain. Patient had MRI by orthopedics in [place] revealing T12 cmopression fx. Pt admitted 05/04/21 for low back pain and plan for kyphoplasty once insurance approved. PMH: Felty syndrome status post splenectomy, COPD, tobacco use, HTN, rheumatoid arthritis, DMII ?  ?OT comments ? Patient reporting 7/10 back pain with movement, when asked if he has been taking pain medicine/recently patient replies "whatever they give me." Patient needing min A to power up to standing from edge of bed with initial leg buckling. No loss of balance with ambulating or standing at sink however patient does lean on sink during bathing for support. Patient reports plan is to D/C home today.  ? ?Recommendations for follow up therapy are one component of a multi-disciplinary discharge planning process, led by the attending physician.  Recommendations may be updated based on patient status, additional functional criteria and insurance authorization. ?   ?Follow Up Recommendations ? No OT follow up  ?  ?Assistance Recommended at Discharge Intermittent Supervision/Assistance  ?Patient can return home with the following ? A little help with bathing/dressing/bathroom;Assistance with cooking/housework ?  ?Equipment Recommendations ? None recommended by OT  ?  ?   ?Precautions / Restrictions Precautions ?Precautions: Back ?Precaution Comments: T12 compression fracture - back precautions for comfort ?Restrictions ?Weight Bearing Restrictions: No  ? ? ?  ? ?Mobility Bed Mobility ?Overal bed mobility: Modified Independent ?  ?  ?  ?  ?  ?  ?General bed mobility comments: Patient needing increased time to roll and lift legs back onto bed. Use of bed rails and  HOB elevated. ?  ? ? ?  ?Balance Overall balance assessment: Needs assistance ?Sitting-balance support: Feet supported ?Sitting balance-Leahy Scale: Good ?  ?  ?Standing balance support: Single extremity supported, Bilateral upper extremity supported ?Standing balance-Leahy Scale: Poor ?  ?  ?  ?  ?  ?  ?  ?  ?  ?  ?  ?  ?   ? ?ADL either performed or assessed with clinical judgement  ? ?ADL Overall ADL's : Needs assistance/impaired ?  ?  ?  ?  ?Upper Body Bathing: Min guard;Standing ?Upper Body Bathing Details (indicate cue type and reason): Patient leans heavily onto sink for support. Min G for safety as LEs appear to buckle at times. ?Lower Body Bathing: Minimal assistance;Sitting/lateral leans ?Lower Body Bathing Details (indicate cue type and reason): Patient able to wash upper legs and top of lower legs while seated on edge of bed. ?  ?  ?  ?  ?Toilet Transfer: Minimal assistance;Ambulation;Rolling walker (2 wheels) ?Toilet Transfer Details (indicate cue type and reason): Patient needing min A to power up to standing from edge of bed with initial buckling of legs ?  ?  ?  ?  ?Functional mobility during ADLs: Minimal assistance;Rolling walker (2 wheels) ?  ?  ? ? ? ?Cognition Arousal/Alertness: Awake/alert ?Behavior During Therapy: [facility] for tasks assessed/performed ?Overall Cognitive Status: Within Functional Limits for tasks assessed ?  ?  ?  ?  ?  ?  ?  ?  ?  ?  ?  ?  ?  ?  ?  ?  ?  ?  ?  ?   ?   ?   ?   ? ? ?  Pertinent Vitals/ Pain       Pain Assessment ?Pain Assessment: 0-10 ?Pain Score: 7  ?Pain Location: back ?Pain Descriptors / Indicators: Grimacing ?Pain Intervention(s):  (when asked if pt has asked for pain medication patient states "Just what they have been giving me") ? ?   ?   ? ?Frequency ? Min 2X/week  ? ? ? ? ?  ?Progress Toward Goals ? ?OT Goals(current goals can now be found in the care plan section) ? Progress towards OT goals: Progressing toward goals ? ?Acute Rehab OT Goals ?Patient Stated  Goal: Home today ?OT Goal Formulation: With patient ?Time For Goal Achievement: 05/19/21 ?Potential to Achieve Goals: Good ?ADL Goals ?Pt Will Perform Lower Body Bathing: with modified independence;with adaptive equipment;sitting/lateral leans;sit to/from stand ?Pt Will Perform Lower Body Dressing: with modified independence;sit to/from stand;sitting/lateral leans;with adaptive equipment ?Pt Will Transfer to Toilet: with modified independence;ambulating ?Pt Will Perform Toileting - Clothing Manipulation and hygiene: with modified independence;sitting/lateral leans;sit to/from stand  ?Plan Discharge plan remains appropriate   ? ?   ?AM-PAC OT "6 Clicks" Daily Activity     ?Outcome Measure ? ? Help from another person eating meals?: None ?Help from another person taking care of personal grooming?: A Little ?Help from another person toileting, which includes using toliet, bedpan, or urinal?: A Little ?Help from another person bathing (including washing, rinsing, drying)?: A Little ?Help from another person to put on and taking off regular upper body clothing?: A Little ?Help from another person to put on and taking off regular lower body clothing?: A Little ?6 Click Score: 19 ? ?  ?End of Session Equipment Utilized During Treatment: Rolling walker (2 wheels) ? ?OT Visit Diagnosis: Pain;Unsteadiness on feet (R26.81) ?Pain - part of body:  (Back) ?  ?Activity Tolerance Patient tolerated treatment well ?  ?Patient Left in bed;with call bell/phone within reach ?  ?Nurse Communication Mobility status ?  ? ?   ? ?Time: 3539-1225 ?OT Time Calculation (min): 15 min ? ?Charges: OT General Charges ?$OT Visit: 1 Visit ?OT Treatments ?$Self Care/Home Management : 8-22 mins ? ?Marlyce Huge OT ?OT pager: [phone] ? ? ?Carmelia Roller ?05/13/2021, 12:12 PM ?

## 2021-05-16 ENCOUNTER — Other Ambulatory Visit: Payer: Self-pay | Admitting: Radiology

## 2021-05-16 DIAGNOSIS — G8929 Other chronic pain: Secondary | ICD-10-CM

## 2021-05-17 ENCOUNTER — Other Ambulatory Visit: Payer: Self-pay | Admitting: Radiology

## 2021-05-17 DIAGNOSIS — G8929 Other chronic pain: Secondary | ICD-10-CM

## 2021-05-17 LAB — NICOTINE/COTININE METABOLITES
Cotinine: 7.8 ng/mL
Nicotine: <1 ng/mL

## 2021-05-18 ENCOUNTER — Encounter: Payer: Self-pay | Admitting: *Deleted

## 2021-05-18 ENCOUNTER — Ambulatory Visit
Admission: RE | Admit: 2021-05-18 | Discharge: 2021-05-18 | Disposition: A | Discharge status: Home / Self Care | Payer: Medicare HMO | Source: Ambulatory Visit | Attending: Radiology | Admitting: Radiology

## 2021-05-18 ENCOUNTER — Other Ambulatory Visit: Payer: Self-pay

## 2021-05-18 DIAGNOSIS — S22080A Wedge compression fracture of T11-T12 vertebra, initial encounter for closed fracture: Secondary | ICD-10-CM | POA: Diagnosis not present

## 2021-05-18 DIAGNOSIS — M545 Low back pain, unspecified: Secondary | ICD-10-CM

## 2021-05-18 HISTORY — PX: IR RADIOLOGIST EVAL & MGMT: IMG5224

## 2021-05-18 NOTE — Consult Note (Signed)
? ?Chief Complaint: ?Back pain ? ?History of Present Illness: ?Nathan Nicholson is a 66 y.o. male with history of COPD (intermittent home oxygen use), smoking history, rheumatoid arthritis with Felty syndrome (s/p splenectomy 2005) who was recently admitted to [facility] hospital from 05/04/21 to 05/13/21 for back pain.  MRI thoracic spine demonstrated subacute compression fractures of T11 and T12.  He endorses approximately 2 months of back pain without no inciting event or trauma.  The pain has progressively worsened.  He is taking oxycodone occasionally with minimal relief.  He is having to sleep in a recliner and is wheelchair bound.  He reports approximately 30 pounds weight loss over the past several months, which he attributes to lack of appetite due to severity of back pain.  He has no other oncologic history. He continues to smoke cigarettes, difficult to quantify on interview.  He is currently taking 81 mg aspirin daily. ? ?Past Medical History:  ?Diagnosis Date  ? COPD (chronic obstructive pulmonary disease) ([place])   ? COVID-19   ? 03-2020  ? Dyspnea   ? with exertion  ? Hypertension   ? Pneumonia   ? Rheumatoid arthritis ([place])   ? ? ?Past Surgical History:  ?Procedure Laterality Date  ? FOOT SURGERY    ? left  ? HERNIA REPAIR    ? SPLENECTOMY    ? TOOTH EXTRACTION    ? TOTAL HIP ARTHROPLASTY Right 08/17/2020  ? Procedure: TOTAL HIP ARTHROPLASTY ANTERIOR APPROACH;  Surgeon: Paralee Cancel, MD;  Location: [facility];  Service: Orthopedics;  Laterality: Right;  70 min  ? ? ?Allergies: ?Patient has no known allergies. ? ?Medications: ?Prior to Admission medications   ?Medication Sig Start Date End Date Taking? Authorizing Provider  ?albuterol (VENTOLIN HFA) 108 (90 Base) MCG/ACT inhaler Inhale 2 puffs into the lungs every 4 (four) hours as needed for wheezing or shortness of breath. 04/15/21   [provider]  ?aspirin 81 MG EC tablet Take 81 mg by mouth every morning.    [provider]  ?atorvastatin  (LIPITOR) 10 MG tablet Take 10 mg by mouth every morning.    [provider]  ?cyanocobalamin (,VITAMIN B-12,) 1000 MCG/ML injection Inject 1,000 mcg into the muscle every 30 (thirty) days.    [provider]  ?folic acid (FOLVITE) 1 MG tablet Take 1 mg by mouth every morning.    [provider]  ?ipratropium-albuterol (DUONEB) 0.5-2.5 (3) MG/3ML SOLN Take 3 mLs by nebulization every 6 (six) hours as needed (shortness of breath/wheezing/asthma).    [provider]  ?metFORMIN (GLUCOPHAGE-XR) 500 MG 24 hr tablet 500 mg every morning.    [provider]  ?nicotine (NICODERM CQ - DOSED IN MG/24 HOURS) 14 mg/24hr patch Place 1 patch  onto the skin daily. 05/13/21 05/13/22  [name], DO  ?oxyCODONE (OXY IR/ROXICODONE) 5 MG immediate release tablet Take 1 tablet (5 mg total) by mouth every 6 (six) hours as needed for up to 5 days for breakthrough pain or severe pain. 05/13/21 05/18/21  [name], DO  ?OXYGEN Inhale 2 L into the lungs as needed.    [provider]  ?polyethylene glycol (MIRALAX / GLYCOLAX) 17 g packet Take 17 g by mouth daily as needed for mild constipation. 05/13/21   [name], DO  ?senna-docusate (SENOKOT-S) 8.6-50 MG tablet Take 1 tablet by mouth 2 (two) times daily as needed for moderate constipation. 05/13/21   [name], DO  ?Skin Protectants, Misc. (EUCERIN)  cream Apply 1 application topically as needed for dry skin.    [provider]  ?Vitamin D, Ergocalciferol, (DRISDOL) 1.25 MG (50000 UNIT) CAPS capsule Take 50,000 Units by mouth every Monday.    [provider]  ?  ? ?Family History  ?Problem Relation Age of Onset  ? Heart attack Brother   ? ? ?Social History  ? ?Socioeconomic History  ? Marital status: Married  ?  Spouse name: Nathan Nicholson  ? Number of children: 2  ? Years of education: Not on file  ? Highest education level: Not on file  ?Occupational History  ? Not on file  ?Tobacco Use  ? Smoking status:  Every Day  ?  Packs/day: 0.50  ?  Years: 40.00  ?  Pack years: 20.00  ?  Types: Cigarettes  ? Smokeless tobacco: Never  ?Vaping Use  ? Vaping Use: Never used  ?Substance and Sexual Activity  ? Alcohol use: Not Currently  ?  Comment: rare  ? Drug use: Never  ? Sexual activity: Not Currently  ?Other Topics Concern  ? Not on file  ?Social History Narrative  ? Not on file  ? ?Social Determinants of Health  ? ?Financial Resource Strain: Not on file  ?Food Insecurity: Not on file  ?Transportation Needs: Not on file  ?Physical Activity: Not on file  ?Stress: Not on file  ?Social Connections: Not on file  ? ? ?Review of Systems: A 12 point ROS discussed and pertinent positives are indicated in the HPI above.  All other systems are negative. ? ?Vital Signs: ?BP (!) 146/86 (BP Location: Right Arm)   Pulse (!) 110   SpO2 95%  ? ?Physical Exam ?Constitutional:   ?   General: He is not in acute distress. ?HENT:  ?   Head: Normocephalic.  ?   Mouth/Throat:  ?   Mouth: Mucous membranes are moist.  ?Eyes:  ?   General: No scleral icterus. ?Cardiovascular:  ?   Rate and Rhythm: Regular rhythm. Tachycardia present.  ?Pulmonary:  ?   Effort: No respiratory distress.  ?   Breath sounds: Normal breath sounds.  ?Abdominal:  ?   General: There is no distension.  ?Musculoskeletal:  ?   Comments: Midline back tenderness at thoracolumbar junction.  ?Skin: ?   General: Skin is warm and dry.  ?Neurological:  ?   Mental Status: He is alert and oriented to person, place, and time.  ? ? ?Imaging: ?T-Spine MRI 04/19/21 ? ? ?Mild T12 retropulsion.   ? ? ?Labs: ? ?CBC: ?Recent Labs  ?  05/09/21 ?[id number] 05/10/21 ?[id number] 05/11/21 ?[id number] 05/12/21 ?[id number]  ?WBC 14.1* 12.8* 14.6* 12.1*  ?HGB 13.1 12.9* 12.9* 12.9*  ?HCT 40.9 39.5 41.3 40.6  ?PLT 352 341 368 372  ? ? ?COAGS: ?Recent Labs  ?  08/17/20 ?1228 05/06/21 ?0503  ?INR 1.1 1.0  ?APTT 28  --   ? ? ?BMP: ?Recent Labs  ?  05/07/21 ?[id number] 05/08/21 ?[id number] 05/09/21 ?[id number] 05/12/21 ?[id number]  ?NA 134* 131* 132* 131*   ?K 3.3* 3.9 4.0 3.6  ?CL 98 97* 98 98  ?CO2 28 28 27 24   ?GLUCOSE 87 111* 111* 122*  ?BUN 13 12 14 12   ?CALCIUM 8.8* 8.6* 8.9 8.9  ?CREATININE 0.87 0.96 1.08 0.86  ?GFRNONAA >60 >60 >60 >60  ? ? ?LIVER FUNCTION TESTS: ?Recent Labs  ?  08/17/20 ?1228 05/04/21 ?1100 05/05/21 ?[id number] 05/06/21 ?0503 05/07/21 ?[id number] 05/08/21 ?[id number] 05/09/21 ?[id number]  ?BILITOT 0.7 0.8 0.6  --   --   --   --   ?  AST 23 24 19   --   --   --   --   ?ALT 18 28 21   --   --   --   --   ?ALKPHOS 78 142* 108  --   --   --   --   ?PROT 6.5 8.3* 7.1  --   --   --   --   ?ALBUMIN 3.0* 3.8 3.0* 3.1* 2.9* 3.0* 3.0*  ? ? ?TUMOR MARKERS: ?No results for input(s): AFPTM, CEA, CA199, CHROMGRNA in the last 8760 hours. ? ?Assessment: ?66 year old male with history of severe mid back pain due to subacute T11 and T12 compression fractures.  While there is no imaging evidence of pathologic etiology, his reported 30 pound weight loss over the past several months is suspicious. ? ?We discussed in depth the indications, risks, and benefits of kyphoplasty and he and his family are agreeable to proceed.  He understands that given scheduling, it may be one of my qualified associates who performs the procedure. ? ?Given his COPD and home oxygen use, I recommend Anesthesia consultation. ? ?Plan: ?T11 and T12 kyphoplasty with bone biopsies at each level under general anesthesia at either [facility] or [facility], first available. ? ? ? ?Electronically Signed: ?Suzette Battiest, MD ?05/18/2021, 9:46 AM ? ? ?I spent a total of  40 Minutes  in face to face in clinical consultation, greater than 50% of which was counseling/coordinating care for thoracic vertebral body fractures. ? ?

## 2021-05-19 DIAGNOSIS — J449 Chronic obstructive pulmonary disease, unspecified: Secondary | ICD-10-CM | POA: Diagnosis not present

## 2021-05-25 DIAGNOSIS — J449 Chronic obstructive pulmonary disease, unspecified: Secondary | ICD-10-CM | POA: Diagnosis not present

## 2021-05-25 DIAGNOSIS — E1169 Type 2 diabetes mellitus with other specified complication: Secondary | ICD-10-CM | POA: Diagnosis not present

## 2021-05-25 DIAGNOSIS — E782 Mixed hyperlipidemia: Secondary | ICD-10-CM | POA: Diagnosis not present

## 2021-05-25 DIAGNOSIS — E785 Hyperlipidemia, unspecified: Secondary | ICD-10-CM | POA: Diagnosis not present

## 2021-05-25 DIAGNOSIS — E559 Vitamin D deficiency, unspecified: Secondary | ICD-10-CM | POA: Diagnosis not present

## 2021-05-25 DIAGNOSIS — M0579 Rheumatoid arthritis with rheumatoid factor of multiple sites without organ or systems involvement: Secondary | ICD-10-CM | POA: Diagnosis not present

## 2021-05-25 DIAGNOSIS — I10 Essential (primary) hypertension: Secondary | ICD-10-CM | POA: Diagnosis not present

## 2021-05-25 DIAGNOSIS — Z6834 Body mass index (BMI) 34.0-34.9, adult: Secondary | ICD-10-CM | POA: Diagnosis not present

## 2021-05-30 NOTE — Progress Notes (Signed)
? ?Cardiology Office Note:   ? ?Date:  05/30/2021  ? ?ID:  Nathan Nicholson, DOB 20-Oct-1955, MRN ID:[id number] ? ?PCP:  Earlyne Iba, NP ?  ?[facility] Providers ?Cardiologist:  Candee Furbish, MD  ?   ? ?Referring MD: Earlyne Iba, NP  ? ?Follow-up for hypertension ? ?History of Present Illness:   ? ?Nathan Nicholson is a 66 y.o. male with a hx of HTN, COPD, diabetes mellitus type 2, AKI, tobacco use, HLD, obesity, and hyponatremia.PMH also includes PVCs and COVID-19 infection 1/22..  Echocardiogram 08/19/2018 showed an LVEF of 60-65% and G1 DD.  He was noted to have a mildly enlarged right ventricle. ? ?He presented for right total hip replacement.  Postoperatively his heart was noted to be in the 30s and 40s.  However, there were no rhythm strips from that time.  His EKG at the time of consult/evaluation showed normal sinus rhythm with frequent PVCs.  His TSH was within defined limits.  He was started on metoprolol 100 mg twice daily. ? ?He was admitted to the hospital 05/04/2021 with vertebral fracture. ? ?He presents to the clinic today for follow-up evaluation and preoperative cardiac evaluation.  He states he has been unable to have much physical activity over the last 4 months due to his back pain.  He has been sleeping in his recliner due to his back discomfort.  He is able to ambulate around the house and back and forth to the bathroom.  He has not noticed any palpitations or irregular heartbeats.  We discussed having the surgeon send [place] a preoperative cardiac evaluation form.  His EKG today shows sinus tachycardia with fusion complexes.  His blood pressure is slightly elevated but he expresses back pain.  I will give him a salty 6 diet sheet, have him increase his physical activity as tolerated, and await preoperative cardiac clearance request form.  We will have him follow-up in 9-12 months. ? ?Today he denies chest pain, shortness of breath, lower extremity edema, fatigue, palpitations, melena, hematuria,  hemoptysis, diaphoresis, weakness, presyncope, syncope, orthopnea, and PND. ? ? ?Past Medical History:  ?Diagnosis Date  ? COPD (chronic obstructive pulmonary disease) ([place])   ? COVID-19   ? 03-2020  ? Dyspnea   ? with exertion  ? Hypertension   ? Pneumonia   ? Rheumatoid arthritis ([place])   ? ? ?Past Surgical History:  ?Procedure Laterality Date  ? FOOT SURGERY    ? left  ? HERNIA REPAIR    ? IR RADIOLOGIST EVAL & MGMT  05/18/2021  ? SPLENECTOMY    ? TOOTH EXTRACTION    ? TOTAL HIP ARTHROPLASTY Right 08/17/2020  ? Procedure: TOTAL HIP ARTHROPLASTY ANTERIOR APPROACH;  Surgeon: Paralee Cancel, MD;  Location: [facility];  Service: Orthopedics;  Laterality: Right;  70 min  ? ? ?Current Medications: ?No outpatient medications have been marked as taking for the 06/01/21 encounter (Appointment) with Deberah Pelton, NP.  ?  ? ?Allergies:   Patient has no known allergies.  ? ?Social History  ? ?Socioeconomic History  ? Marital status: Married  ?  Spouse name: Hawley Luby  ? Number of children: 2  ? Years of education: Not on file  ? Highest education level: Not on file  ?Occupational History  ? Not on file  ?Tobacco Use  ? Smoking status: Every Day  ?  Packs/day: 0.50  ?  Years: 40.00  ?  Pack years: 20.00  ?  Types: Cigarettes  ?  Smokeless tobacco: Never  ?Vaping Use  ? Vaping Use: Never used  ?Substance and Sexual Activity  ? Alcohol use: Not Currently  ?  Comment: rare  ? Drug use: Never  ? Sexual activity: Not Currently  ?Other Topics Concern  ? Not on file  ?Social History Narrative  ? Not on file  ? ?Social Determinants of Health  ? ?Financial Resource Strain: Not on file  ?Food Insecurity: Not on file  ?Transportation Needs: Not on file  ?Physical Activity: Not on file  ?Stress: Not on file  ?Social Connections: Not on file  ?  ? ?Family History: ?The patient's family history includes Heart attack in his brother. ? ?ROS:   ?Please see the history of present illness.    ? All other systems reviewed and are  negative. ? ? ?Risk Assessment/Calculations:   ? ? ?    ? ?Physical Exam:   ? ?VS:  There were no vitals taken for this visit.   ? ?Wt Readings from Last 3 Encounters:  ?05/08/21 202 lb 9.6 oz (91.9 kg)  ?05/04/21 191 lb 3.2 oz (86.7 kg)  ?09/30/20 214 lb 6.4 oz (97.3 kg)  ?  ? ?GEN:  Well nourished, well developed in no acute distress ?HEENT: Normal ?NECK: No JVD; No carotid bruits ?LYMPHATICS: No lymphadenopathy ?CARDIAC: RRR, no murmurs, rubs, gallops ?RESPIRATORY:  Clear to auscultation without rales, wheezing or rhonchi  ?ABDOMEN: Soft, non-tender, non-distended ?MUSCULOSKELETAL:  No edema; No deformity  ?SKIN: Warm and dry ?NEUROLOGIC:  Alert and oriented x 3 ?PSYCHIATRIC:  Normal affect  ? ? ?EKGs/Labs/Other Studies Reviewed:   ? ?The following studies were reviewed today: ? ?Echocardiogram 08/18/2020 ?IMPRESSIONS  ? ? ? 1. Left ventricular ejection fraction, by estimation, is 60 to 65%. The  ?left ventricle has normal function. The left ventricle has no regional  ?wall motion abnormalities. Left ventricular diastolic parameters are  ?consistent with Grade I diastolic  ?dysfunction (impaired relaxation).  ? 2. Right ventricular systolic function is normal. The right ventricular  ?size is mildly enlarged. Tricuspid regurgitation signal is inadequate for  ?assessing PA pressure.  ? 3. The mitral valve is grossly normal. No evidence of mitral valve  ?regurgitation. No evidence of mitral stenosis.  ? 4. The aortic valve is tricuspid. Aortic valve regurgitation is not  ?visualized. No aortic stenosis is present.  ? 5. The inferior vena cava is normal in size with greater than 50%  ?respiratory variability, suggesting right atrial pressure of 3 mmHg.  ? ?Comparison(s): No prior Echocardiogram. ? ?EKG:  EKG is  ordered today.  The ekg ordered today demonstrates sinus tachycardia with fusion complexes 109 bpm ? ?Recent Labs: ?05/04/2021: TSH 2.286 ?05/05/2021: ALT 21 ?05/12/2021: BUN 12; Creatinine, Ser 0.86; Hemoglobin  12.9; Magnesium 1.8; Platelets 372; Potassium 3.6; Sodium 131  ?Recent Lipid Panel ?No results found for: CHOL, TRIG, HDL, CHOLHDL, VLDL, LDLCALC, LDLDIRECT ? ?ASSESSMENT & PLAN  ? ? ?PVCs-cardiac unaware, denies fatigue.  Previously noted to have frequent PVCs during hospital admission post hip replacement.   ?Heart healthy low-sodium diet ?Increase physical activity as tolerated ?Avoid caffeine, chocolate, EtOH, dehydration etc. ? ?Essential hypertension-BP today 144/94.  Slightly elevated due to back discomfort/pain.  Well-controlled at home. ?Heart healthy low-sodium diet-salty 6 given ?Increase physical activity as tolerated ? ?Preoperative cardiac evaluation-T11 and T12 compression fracture, IR kyphoplasty pending insurance approval ? ?  ? ?Primary Cardiologist: Donato Schultz, MD ? ?Chart reviewed as part of pre-operative protocol coverage. Given past medical history and time  since last visit, based on ACC/AHA guidelines, Nathan Nicholson would be at acceptable risk for the planned procedure without further cardiovascular testing.  ? ?Patient was advised that if he develops new symptoms prior to surgery to contact our office to arrange a follow-up appointment.  He verbalized understanding. ? ?His RCRI is a class II risk, 0.9% risk of major cardiac event.  His Duke activity status index score is 7.2. ? ? ? ?Disposition: Follow-up with Dr. Marlou Porch or APP in 9-12 months. ? ?   ? ? ?Medication Adjustments/Labs and Tests Ordered: ?Current medicines are reviewed at length with the patient today.  Concerns regarding medicines are outlined above.  ?No orders of the defined types were placed in this encounter. ? ?No orders of the defined types were placed in this encounter. ? ? ?There are no Patient Instructions on file for this visit.  ? ?Signed, ?Deberah Pelton, NP  ?05/30/2021 12:32 PM    ? ? ?Notice: This dictation was prepared with Dragon dictation along with smaller phrase technology. Any transcriptional errors that  result from this process are unintentional and may not be corrected upon review. ? ?I spent 14 minutes examining this patient, reviewing medications, and using patient centered shared decision making involving her cardiac

## 2021-06-01 ENCOUNTER — Other Ambulatory Visit: Payer: Self-pay

## 2021-06-01 ENCOUNTER — Encounter (HOSPITAL_BASED_OUTPATIENT_CLINIC_OR_DEPARTMENT_OTHER): Payer: Self-pay | Admitting: General Practice

## 2021-06-01 ENCOUNTER — Ambulatory Visit (HOSPITAL_BASED_OUTPATIENT_CLINIC_OR_DEPARTMENT_OTHER): Payer: Medicare HMO | Admitting: General Practice

## 2021-06-01 VITALS — BP 144/94 | HR 109 | Ht 63.0 in | Wt 195.0 lb

## 2021-06-01 DIAGNOSIS — I10 Essential (primary) hypertension: Secondary | ICD-10-CM | POA: Diagnosis not present

## 2021-06-01 DIAGNOSIS — I493 Ventricular premature depolarization: Secondary | ICD-10-CM

## 2021-06-01 DIAGNOSIS — Z01810 Encounter for preprocedural cardiovascular examination: Secondary | ICD-10-CM

## 2021-06-01 NOTE — Patient Instructions (Signed)
Medication Instructions:  ?Your Physician recommend you continue on your current medication as directed.   ? ?*If you need a refill on your cardiac medications before your next appointment, please call your pharmacy* ? ?Testing/Procedures: ?Please contact the office doing your surgery and ask them to fax your preoperative clearance request to Attn: Shannen Flansburg at [phone] ? ? ?Follow-Up: ?At [facility], you and your health needs are our priority.  As part of our continuing mission to provide you with exceptional heart care, we have created designated Provider Care Teams.  These Care Teams include your primary Cardiologist (physician) and Advanced Practice Providers (APPs -  Physician Assistants and Nurse Practitioners) who all work together to provide you with the care you need, when you need it. ? ?We recommend signing up for the patient portal called "MyChart".  Sign up information is provided on this After Visit Summary.  MyChart is used to connect with patients for Virtual Visits (Telemedicine).  Patients are able to view lab/test results, encounter notes, upcoming appointments, etc.  Non-urgent messages can be sent to your provider as well.   ?To learn more about what you can do with MyChart, go to [email or web address].   ? ?Your next appointment:   ?9-12 month(s) ? ?The format for your next appointment:   ?In Person ? ?Provider:   ?Donato Schultz, MD { ? ?Other Instructions ?Exercise recommendations: ?The American Heart Association recommends 150 minutes of moderate intensity exercise weekly. ?Try 30 minutes of moderate intensity exercise 4-5 times per week. ?This could include walking, jogging, or swimming. ? ? ? ? ?

## 2021-06-03 DIAGNOSIS — I10 Essential (primary) hypertension: Secondary | ICD-10-CM | POA: Diagnosis not present

## 2021-06-03 DIAGNOSIS — E1169 Type 2 diabetes mellitus with other specified complication: Secondary | ICD-10-CM | POA: Diagnosis not present

## 2021-06-03 DIAGNOSIS — J449 Chronic obstructive pulmonary disease, unspecified: Secondary | ICD-10-CM | POA: Diagnosis not present

## 2021-06-07 ENCOUNTER — Encounter: Payer: Self-pay | Admitting: Pulmonary Disease

## 2021-06-07 ENCOUNTER — Ambulatory Visit: Payer: Medicare HMO | Admitting: Pulmonary Disease

## 2021-06-07 VITALS — BP 140/86 | HR 90 | Ht 63.0 in | Wt 197.0 lb

## 2021-06-07 DIAGNOSIS — J441 Chronic obstructive pulmonary disease with (acute) exacerbation: Secondary | ICD-10-CM | POA: Diagnosis not present

## 2021-06-07 DIAGNOSIS — J9601 Acute respiratory failure with hypoxia: Secondary | ICD-10-CM | POA: Diagnosis not present

## 2021-06-07 MED ORDER — AZITHROMYCIN 250 MG PO TABS: ORAL_TABLET | ORAL | 0 refills | Status: DC

## 2021-06-07 MED ORDER — PREDNISONE 10 MG PO TABS: ORAL_TABLET | ORAL | 0 refills | Status: AC

## 2021-06-07 MED ORDER — BUDESONIDE 0.5 MG/2ML IN SUSP: 0.5000 mg | Freq: Two times a day (BID) | RESPIRATORY_TRACT | 11 refills | Status: DC

## 2021-06-07 NOTE — Progress Notes (Signed)
? ?Synopsis: Referred in July 2022 for COPD by Collier Salina, FNP ? ?Subjective:  ? ?PATIENT ID: Nathan Nicholson GENDER: male DOB: [birth year], MRN: [id number] ? ?HPI ? ?Chief Complaint  ?Patient presents with  ? Follow-up  ?  F/U for COPD. States his breathing has been stable since last visit.   ? ?Nathan Nicholson is a 66 year old male, daily smoker with hypertension and rheumatoid arthritis who returns pulmonary clinic for COPD.  ? ?He stopped smoking 2 weeks ago.  He did not tolerate nicotine patches due to bad dreams.  He did not pick up Breztri as prescribed at last visit as he reports it would have cost him $50.  He is only using albuterol inhaler as needed at this time.  He continues to have shortness of breath, cough and wheezing. ? ?Patient is being evaluated by radiology for T11 and T12 kyphoplasty with bone biopsies.  He is having significant back pain.  He is awaiting to be scheduled for his procedure date. ? ?OV 09/30/20 ?He smokes half a pack per day and has smoked for 40 years. He is currently disabled but previously worked in a [employer] and also in a [employer].  ? ?He has issues with shortness of breath, cough with sputum production and wheezing. He is currently using adviar diskus 250-44mcg 1 puff twice daily and as needed albuterol. He is using his albuterol inhaler multiple times per day.  ? ?He had an absolute eosinophil coung of 900 in 2015.  ? ?Past Medical History:  ?Diagnosis Date  ? COPD (chronic obstructive pulmonary disease) ([place])   ? COVID-19   ? 03-2020  ? Dyspnea   ? with exertion  ? Hypertension   ? Pneumonia   ? Rheumatoid arthritis ([place])   ?  ? ?Family History  ?Problem Relation Age of Onset  ? Heart attack Brother   ?  ? ?Social History  ? ?Socioeconomic History  ? Marital status: Married  ?  Spouse name: Melbin Pach  ? Number of children: 2  ? Years of education: Not on file  ? Highest education level: Not on file  ?Occupational History  ? Not on file  ?Tobacco Use  ? Smoking  status: Every Day  ?  Packs/day: 0.50  ?  Years: 40.00  ?  Pack years: 20.00  ?  Types: Cigarettes  ? Smokeless tobacco: Never  ?Vaping Use  ? Vaping Use: Never used  ?Substance and Sexual Activity  ? Alcohol use: Not Currently  ?  Comment: rare  ? Drug use: Never  ? Sexual activity: Not Currently  ?Other Topics Concern  ? Not on file  ?Social History Narrative  ? Not on file  ? ?Social Determinants of Health  ? ?Financial Resource Strain: Not on file  ?Food Insecurity: Not on file  ?Transportation Needs: Not on file  ?Physical Activity: Not on file  ?Stress: Not on file  ?Social Connections: Not on file  ?Intimate Partner Violence: Not on file  ?  ? ?No Known Allergies  ? ?Outpatient Medications Prior to Visit  ?Medication Sig Dispense Refill  ? albuterol (VENTOLIN HFA) 108 (90 Base) MCG/ACT inhaler Inhale 2 puffs into the lungs every 4 (four) hours as needed for wheezing or shortness of breath.    ? aspirin 81 MG EC tablet Take 81 mg by mouth every morning.    ? atorvastatin (LIPITOR) 10 MG tablet Take 10 mg by mouth every morning.    ? cyanocobalamin (,VITAMIN  B-12,) 1000 MCG/ML injection Inject 1,000 mcg into the muscle every 30 (thirty) days.    ? folic acid (FOLVITE) 1 MG tablet Take 1 mg by mouth every morning.    ? ipratropium-albuterol (DUONEB) 0.5-2.5 (3) MG/3ML SOLN Take 3 mLs by nebulization every 6 (six) hours as needed (shortness of breath/wheezing/asthma).    ? metFORMIN (GLUCOPHAGE-XR) 500 MG 24 hr tablet 500 mg every morning.    ? nicotine (NICODERM CQ - DOSED IN MG/24 HOURS) 14 mg/24hr patch Place 1 patch  onto the skin daily. 90 patch 3  ? OXYGEN Inhale 2 L into the lungs as needed.    ? polyethylene glycol (MIRALAX / GLYCOLAX) 17 g packet Take 17 g by mouth daily as needed for mild constipation. 14 each 0  ? senna-docusate (SENOKOT-S) 8.6-50 MG tablet Take 1 tablet by mouth 2 (two) times daily as needed for moderate constipation. 20 tablet 0  ? Skin Protectants, Misc. (EUCERIN) cream Apply 1  application topically as needed for dry skin.    ? Vitamin D, Ergocalciferol, (DRISDOL) 1.25 MG (50000 UNIT) CAPS capsule Take 50,000 Units by mouth every Monday.    ? ?No facility-administered medications prior to visit.  ? ? ?Review of Systems  ?Constitutional:  Negative for chills, fever, malaise/fatigue and weight loss.  ?HENT:  Negative for congestion, sinus pain and sore throat.   ?Eyes: Negative.   ?Respiratory:  Positive for cough, sputum production, shortness of breath and wheezing. Negative for hemoptysis.   ?Cardiovascular:  Negative for chest pain, palpitations, orthopnea, claudication and leg swelling.  ?Gastrointestinal:  Negative for abdominal pain, heartburn, nausea and vomiting.  ?Genitourinary: Negative.   ?Musculoskeletal:  Negative for joint pain and myalgias.  ?Skin:  Negative for rash.  ?Neurological:  Negative for weakness.  ?Endo/Heme/Allergies: Negative.   ?Psychiatric/Behavioral: Negative.    ? ?Objective:  ? ?Vitals:  ? 06/07/21 1500  ?BP: 140/86  ?Pulse: 90  ?SpO2: 95%  ?Weight: 197 lb (89.4 kg)  ?Height: 5\' 3"  (1.6 m)  ? ?Physical Exam ?Constitutional:   ?   General: He is not in acute distress. ?HENT:  ?   Head: Normocephalic and atraumatic.  ?Eyes:  ?   Conjunctiva/sclera: Conjunctivae normal.  ?Cardiovascular:  ?   Rate and Rhythm: Normal rate and regular rhythm.  ?   Pulses: Normal pulses.  ?   Heart sounds: Normal heart sounds. No murmur heard. ?Pulmonary:  ?   Effort: Pulmonary effort is normal.  ?   Breath sounds: Decreased breath sounds and wheezing (diffuse) present. No rhonchi or rales.  ?Musculoskeletal:  ?   Right lower leg: No edema.  ?   Left lower leg: No edema.  ?Skin: ?   General: Skin is warm and dry.  ?Neurological:  ?   General: No focal deficit present.  ?   Mental Status: He is alert.  ?Psychiatric:     ?   Mood and Affect: Mood normal.     ?   Behavior: Behavior normal.     ?   Thought Content: Thought content normal.     ?   Judgment: Judgment normal.  ? ? ?CBC ?    ?Component Value Date/Time  ? WBC 12.1 (H) 05/12/2021 0504  ? RBC 4.54 05/12/2021 0504  ? HGB 12.9 (L) 05/12/2021 0504  ? HGB 15.3 05/04/2021 1100  ? HGB 16.3 03/11/2013 0847  ? HCT 40.6 05/12/2021 0504  ? HCT 48.3 03/11/2013 0847  ? PLT 372 05/12/2021 0504  ? PLT  428 (H) 05/04/2021 1100  ? PLT 372 Large & giant platelets 03/11/2013 0847  ? MCV 89.4 05/12/2021 0504  ? MCV 90.2 03/11/2013 0847  ? MCH 28.4 05/12/2021 0504  ? MCHC 31.8 05/12/2021 0504  ? RDW 15.1 05/12/2021 0504  ? RDW 14.7 (H) 03/11/2013 0847  ? LYMPHSABS 3.7 05/04/2021 1100  ? LYMPHSABS 3.6 (H) 03/11/2013 0847  ? MONOABS 1.2 (H) 05/04/2021 1100  ? MONOABS 2.1 (H) 03/11/2013 0847  ? EOSABS 0.5 05/04/2021 1100  ? EOSABS 0.9 (H) 03/11/2013 0847  ? BASOSABS 0.1 05/04/2021 1100  ? BASOSABS 0.2 (H) 03/11/2013 0847  ? ? ?  Latest Ref Rng & Units 05/12/2021  ?  5:04 AM 05/09/2021  ?  5:09 AM 05/08/2021  ?  5:16 AM  ?BMP  ?Glucose 70 - 99 mg/dL 122   111   111    ?BUN 8 - 23 mg/dL 12   14   12     ?Creatinine 0.61 - 1.24 mg/dL 0.86   1.08   0.96    ?Sodium 135 - 145 mmol/L 131   132   131    ?Potassium 3.5 - 5.1 mmol/L 3.6   4.0   3.9    ?Chloride 98 - 111 mmol/L 98   98   97    ?CO2 22 - 32 mmol/L 24   27   28     ?Calcium 8.9 - 10.3 mg/dL 8.9   8.9   8.6    ? ?Chest imaging: ?CXR 2005 ?No acute cardiopulmonary findings. Lungs clear bilaterally.  ? ?PFT: ?   ? View : No data to display.  ?  ?  ?  ? ? ?Echo 08/18/20: ?LVEF 60-65%. Grade I diastolic dysfunction. RV systolic function is normal. RV is mildly enlarged.  ? ?Assessment & Plan:  ? ?COPD with exacerbation ([place]) - Plan: budesonide (PULMICORT) 0.5 MG/2ML nebulizer solution, predniSONE (DELTASONE) 10 MG tablet, azithromycin (ZITHROMAX) 250 MG tablet ? ?Discussion: ?Wyat Kosick is a 66 year old male, daily smoker with hypertension and rheumatoid arthritis who returns pulmonary clinic for COPD.  ? ?We will treat him for COPD exacerbation today with steroid taper and azithromycin.  ? ?Given concerns for costs  due to inhalers we will start him on scheduled nebulizer regimen. ? ?He is to use DuoNeb treatments every 6 hours and start budesonide nebulizer treatments twice daily. ? ?Patient is low to moderate risk f

## 2021-06-07 NOTE — Patient Instructions (Addendum)
Start budesonide nebulizer treatment twice daily ? ?Continue duoneb (albuterol/ipratropium) every 6 hours ? ?Use albuterol inhaler 1-2 puffs every 4-6 hours as needed. ? ?Start prednisone taper as directed ? ?Start Zpak  ? ?Follow up in 6 months ?

## 2021-06-09 ENCOUNTER — Inpatient Hospital Stay: Payer: Medicare HMO | Attending: Oncology | Admitting: Oncology

## 2021-06-09 VITALS — BP 132/84 | HR 94 | Temp 98.1°F | Resp 18 | Ht 63.0 in

## 2021-06-09 DIAGNOSIS — M4854XA Collapsed vertebra, not elsewhere classified, thoracic region, initial encounter for fracture: Secondary | ICD-10-CM | POA: Diagnosis not present

## 2021-06-09 DIAGNOSIS — M069 Rheumatoid arthritis, unspecified: Secondary | ICD-10-CM | POA: Diagnosis not present

## 2021-06-09 DIAGNOSIS — M4856XA Collapsed vertebra, not elsewhere classified, lumbar region, initial encounter for fracture: Secondary | ICD-10-CM | POA: Insufficient documentation

## 2021-06-09 DIAGNOSIS — D72829 Elevated white blood cell count, unspecified: Secondary | ICD-10-CM | POA: Diagnosis not present

## 2021-06-09 DIAGNOSIS — M5136 Other intervertebral disc degeneration, lumbar region: Secondary | ICD-10-CM | POA: Diagnosis not present

## 2021-06-09 DIAGNOSIS — M0500 Felty's syndrome, unspecified site: Secondary | ICD-10-CM | POA: Diagnosis not present

## 2021-06-09 MED ORDER — OXYCODONE HCL 5 MG PO TABS: 5.0000 mg | ORAL_TABLET | Freq: Four times a day (QID) | ORAL | 0 refills | Status: DC | PRN

## 2021-06-09 NOTE — Progress Notes (Signed)
?  [place] ?OFFICE PROGRESS NOTE ? ? ?Diagnosis: Rheumatoid arthritis, compression fractures ? ?INTERVAL HISTORY:  ? ?Mr. Estep was discharged from the hospital 05/13/2021.  He continues to have severe pain in the lower back.  He saw interventional radiology on 05/18/2021.  T11 and T12 kyphoplasties were recommended.  This has not been scheduled.  He does not have pain medication.  Good appetite.  He feels that he is gaining weight. ? ?Objective: ? ?Vital signs in last 24 hours: ? ?Blood pressure 132/84, pulse 94, temperature 98.1 ?F (36.7 ?C), temperature source Oral, resp. rate 18, height 5\' 3"  (1.6 m), SpO2 100 %. ?  ? ?HEENT: No thrush ?Resp: Scattered inspiratory/expiratory rhonchi and wheezes, no respiratory distress ?Cardio: Regular rate and rhythm ?GI: No hepatomegaly ?Vascular: No leg edema ?  ? ?Lab Results: ? ?Lab Results  ?Component Value Date  ? WBC 12.1 (H) 05/12/2021  ? HGB 12.9 (L) 05/12/2021  ? HCT 40.6 05/12/2021  ? MCV 89.4 05/12/2021  ? PLT 372 05/12/2021  ? NEUTROABS 10.3 (H) 05/04/2021  ? ? ?CMP  ?Lab Results  ?Component Value Date  ? NA 131 (L) 05/12/2021  ? K 3.6 05/12/2021  ? CL 98 05/12/2021  ? CO2 24 05/12/2021  ? GLUCOSE 122 (H) 05/12/2021  ? BUN 12 05/12/2021  ? CREATININE 0.86 05/12/2021  ? CALCIUM 8.9 05/12/2021  ? PROT 7.1 05/05/2021  ? ALBUMIN 3.0 (L) 05/09/2021  ? AST 19 05/05/2021  ? ALT 21 05/05/2021  ? ALKPHOS 108 05/05/2021  ? BILITOT 0.6 05/05/2021  ? GFRNONAA >60 05/12/2021  ? ? ? ?Medications: I have reviewed the patient's current medications. ? ? ?Assessment/Plan: ? 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 ? ? ?Disposition: ?Mr. Ruyle has a history of rheumatoid arthritis and Felty syndrome.  He is currently symptomatic with severe back pain secondary to lower thoracic compression fractures.  The fractures are likely benign.  We will contact interventional radiology to schedule the kyphoplasty procedures for soon as possible.  I refilled a prescription for oxycodone. ?Mr. Wintermute will return for an office visit in 4 months. ? ?Betsy Coder, MD ? ?06/09/2021  ?9:51 AM ? ? ?

## 2021-06-11 ENCOUNTER — Encounter: Payer: Self-pay | Admitting: Pulmonary Disease

## 2021-06-13 ENCOUNTER — Telehealth (HOSPITAL_COMMUNITY): Payer: Self-pay

## 2021-06-13 NOTE — Progress Notes (Addendum)
Anesthesia Chart Review: ? Case: Nathan Nicholson Date/Time: 06/17/21 0815  ? Procedure: KYPHOPLASTY  ? Anesthesia type: General  ? Pre-op diagnosis: CHRONIC BACK PAIN  ? Location: [facility] OR RADIOLOGY ROOM / [facility]  ? Surgeons: Pedro Earls, MD  ? ?  ? ? ?DISCUSSION: Patient is a 66 year old male scheduled for the above procedure. ? ?History includes smoking, COPD (home O2 at 2L), HTN, exertional dyspnea, COVID-19 (03/2020), RA with Felty Syndrome (s/p splenectomy; on MTX), right THA ([id number]), supraumbilical hernia repair (05/02/05). ? ?[facility] admission 05/04/21-05/13/21 for pain control and further evaluation for severe, worsening, low pain pain with finding of acute compression fractures at T11/12 (progression of T 11 since 02/22/21) and weight loss ( > 30 lbs). Also with leukocytosis, but chronic and felt related to splenectomy. He was discharged with IR follow-up to discuss future kyphoplasty. Initially he was seen by Ruthann Cancer, MD. Following communication with anesthesia team, he referred patient to pulmonology and cardiology for preoperative evaluations.  ? ?Nathan Nicholson was evaluated by cardiologist Dr. 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 He did not show for his 10/21/20 follow-up, but had preoperative evaluation on 06/01/21 by Coletta Memos, NP.  He wrote, " Given past medical history and time since last visit, based on ACC/AHA guidelines, Nathan Nicholson would be at acceptable risk for the planned procedure without further cardiovascular testing...   ?His RCRI is a class II risk, 0.9% risk of major cardiac event.  His Duke activity status index score is 7.2." ?   ? ?He was last evaluated by pulmonologist Dr. Erin Fulling on 06/07/21. Reportedly, patient had stopped smoking two weeks prior.  He was unable to afford Breztri. He was only using albuterol MDI as needed and continued to have dyspnea, cough, and  wheezing. He was treated for COPD exacerbation with Pulmicort nebulizer, prednisone taper (over 12 days), azithromycin. Given concerns for cost, he put him on scheduled nebulizer regimen--started Duonebs every 6 hours and started budesonide nebulizer BID. In regards to preoperative risk assessment, he wrote, "Patient is low to moderate risk for post-operative pulmonary complications based on ARISCAT risk index, this is a 1.6 to 13.3% chance of post-operatively pulmonary complications, as defined by the occurrence of respiratory failure, respiratory infection, pleural effusion, atelectasis, pneumothorax, bronchospasm or aspiration pneumonitis." I did reach out to Dr. Erin Fulling on 06/13/21 to clarify timing of procedure under anesthesia given recent treatment for COPD exacerbation.  He added, "I would operate soon after this treatment, he is a non-compliant patient". In addition, he wrote, "we provide risk stratifications on our patients with lung disease on their likely hood of having complications. It's up to procedure team to determine whether to move forward with procedure or not." ? ?On 06/13/21, I discussed cardiology and pulmonology preoperative risk stratifications with anesthesiologist Annye Asa, MD. Records suggest Nathan Nicholson has chronic cough and wheezing and has been treated for COPD exacerbation at this last two pulmonology visits. Last week (06/07/21), Dr. Erin Fulling started him on a scheduled nebulizer regimen, as well as treatment for COPD exacerbation symptoms and with hopes to optimize him better for surgery. Given his non-compliance, immediate post treatment may be the preferred timeframe for a surgical procedure in hopes to avoid rescheduling in the midst of another exacerbation. He has completed antibiotics. Prednisone taper is scheduled through 06/20/10. Anesthesia team to evaluate on the day of surgery.  Definitive plan at that time.  ? ?  Reported last ASA 06/01/21.  ? ? ?VS:  ?BP Readings from Last 3  Encounters:  ?06/09/21 132/84  ?06/07/21 140/86  ?06/01/21 (!) 144/94  ? ?Pulse Readings from Last 3 Encounters:  ?06/09/21 94  ?06/07/21 90  ?06/01/21 (!) 109  ?  ? ?PROVIDERS: ?Earlyne Iba, NP is PCP  ?Betsy Coder, MD is HEM-ONC ?Freda Jackson, MD is pulmonologist ?Candee Furbish, MD is cardiologist ? ? ?LABS: Lab on day of procedure as indicated. Currently, most recent results include: ?Lab Results  ?Component Value Date  ? WBC 12.1 (H) 05/12/2021  ? HGB 12.9 (L) 05/12/2021  ? HCT 40.6 05/12/2021  ? PLT 372 05/12/2021  ? GLUCOSE 122 (H) 05/12/2021  ? ALT 21 05/05/2021  ? AST 19 05/05/2021  ? NA 131 (L) 05/12/2021  ? K 3.6 05/12/2021  ? CL 98 05/12/2021  ? CREATININE 0.86 05/12/2021  ? BUN 12 05/12/2021  ? CO2 24 05/12/2021  ? TSH 2.286 05/04/2021  ? INR 1.0 05/06/2021  ? HGBA1C 5.9 (H) 05/04/2021  ?  ? ?IMAGES: ?MRI L-spine 05/05/21: ?IMPRESSION: ?1. Acute or subacute compression fractures of T11 and T12 with ?approximately 50% height loss at both levels. 6 mm of retropulsion ?at T12 without associated stenosis. ?2. L4-L5 left asymmetric disc bulge with small annular fissure in ?close proximity to the exiting left L4 nerve root. ?3. Otherwise normal lumbar spine. ?  ? ?EKG: 06/01/21: ST with fusion complexes at 109 bpm ? ? ?CV: ?Echo 08/18/20: ?IMPRESSIONS  ? 1. Left ventricular ejection fraction, by estimation, is 60 to 65%. The  ?left ventricle has normal function. The left ventricle has no regional  ?wall motion abnormalities. Left ventricular diastolic parameters are  ?consistent with Grade I diastolic  ?dysfunction (impaired relaxation).  ? 2. Right ventricular systolic function is normal. The right ventricular  ?size is mildly enlarged. Tricuspid regurgitation signal is inadequate for  ?assessing PA pressure.  ? 3. The mitral valve is grossly normal. No evidence of mitral valve  ?regurgitation. No evidence of mitral stenosis.  ? 4. The aortic valve is tricuspid. Aortic valve regurgitation is not   ?visualized. No aortic stenosis is present.  ? 5. The inferior vena cava is normal in size with greater than 50%  ?respiratory variability, suggesting right atrial pressure of 3 mmHg.  ?- Comparison(s): No prior Echocardiogram.  ? ? ?Past Medical History:  ?Diagnosis Date  ? COPD (chronic obstructive pulmonary disease) ([place])   ? COVID-19   ? 03-2020  ? Dyspnea   ? with exertion  ? Hypertension   ? Pneumonia   ? Rheumatoid arthritis ([place])   ? ? ?Past Surgical History:  ?Procedure Laterality Date  ? FOOT SURGERY    ? left  ? HERNIA REPAIR    ? IR RADIOLOGIST EVAL & MGMT  05/18/2021  ? SPLENECTOMY    ? TOOTH EXTRACTION    ? TOTAL HIP ARTHROPLASTY Right 08/17/2020  ? Procedure: TOTAL HIP ARTHROPLASTY ANTERIOR APPROACH;  Surgeon: Paralee Cancel, MD;  Location: [facility] ORS;  Service: Orthopedics;  Laterality: Right;  70 min  ? ? ?MEDICATIONS: ?No current facility-administered medications for this encounter.  ? ? albuterol (VENTOLIN HFA) 108 (90 Base) MCG/ACT inhaler  ? aspirin 81 MG EC tablet  ? atorvastatin (LIPITOR) 10 MG tablet  ? budesonide (PULMICORT) 0.5 MG/2ML nebulizer solution  ? cyanocobalamin (,VITAMIN B-12,) 1000 MCG/ML injection  ? folic acid (FOLVITE) 1 MG tablet  ? ipratropium-albuterol (DUONEB) 0.5-2.5 (3) MG/3ML SOLN  ? lisinopril-hydrochlorothiazide (ZESTORETIC) 20-12.5 MG  tablet  ? meloxicam (MOBIC) 15 MG tablet  ? methotrexate (RHEUMATREX) 2.5 MG tablet  ? oxyCODONE (OXY IR/ROXICODONE) 5 MG immediate release tablet  ? OXYGEN  ? predniSONE (DELTASONE) 10 MG tablet  ? senna-docusate (SENOKOT-S) 8.6-50 MG tablet  ? Skin Protectants, Misc. (EUCERIN) cream  ? Vitamin D, Ergocalciferol, (DRISDOL) 1.25 MG (50000 UNIT) CAPS capsule  ? azithromycin (ZITHROMAX) 250 MG tablet  ? nicotine (NICODERM CQ - DOSED IN MG/24 HOURS) 14 mg/24hr patch  ? polyethylene glycol (MIRALAX / GLYCOLAX) 17 g packet  ? ? ?Myra Gianotti, PA-C ?Surgical Short Stay/Anesthesiology ?[facility] Phone [phone] ?[facility] Phone [phone] ?06/16/2021  12:54 PM ? ? ? ? ? ? ?

## 2021-06-13 NOTE — Telephone Encounter (Signed)
-----   Message from Pedro Earls, MD sent at 05/26/2021  3:47 PM EDT ----- ?Regarding: RE: Schedule T11 and T12 Kyphoplasty with Bone Biopsies at each level  General Anesthesia/ASAP ?Sure.  What time would that be?  Just want to make sure that doesn't coincide with the consult. ? ? ?----- Message ----- ?From: Gildardo Pounds D ?Sent: 05/26/2021   3:36 PM EDT ?To: Pedro Earls, MD, # ?Subject: FW: Schedule T11 and T12 Kyphoplasty with Bo# ? ?[name],  ? ?You feel like doing this under general anesthesia tomorrow? ? ?Thanks,  ?Ash ?----- Message ----- ?From: Marcelyn Bruins ?Sent: 05/18/2021  10:16 AM EDT ?To: Harlene Salts ?Subject: Schedule T11 and T12 Kyphoplasty with Bone B# ? ?Interventional Radiology Procedure Request ? ?Name: Nathan Nicholson [birth year] ? ?Procedure: T11 and T12 KP with Bone Biopsies at each level - Gen Anesthesia-##ASAP## ? ?Reason for procedure: Thoracic Compression Fracture ? ?Relevant History:In epic  ? ?Order Physician: Serafina Royals or next availability ##Asap- If possible tomorrow or Friday ? ?Office Contact: Me ?[employer]- Please have Gidget obtain the authorization ?Thank you ? ? ? ?

## 2021-06-16 ENCOUNTER — Other Ambulatory Visit: Payer: Self-pay

## 2021-06-16 ENCOUNTER — Other Ambulatory Visit: Payer: Self-pay | Admitting: Radiology

## 2021-06-16 ENCOUNTER — Encounter (HOSPITAL_COMMUNITY): Payer: Self-pay | Admitting: *Deleted

## 2021-06-16 NOTE — Anesthesia Preprocedure Evaluation (Addendum)
Anesthesia Evaluation  ?Patient identified by MRN, date of birth, ID band ?Patient awake ? ? ? ?Reviewed: ?Allergy & Precautions, NPO status , Patient's Chart, lab work & pertinent test results ? ?Airway ?Mallampati: III ? ?TM Distance: >3 FB ?Neck ROM: Full ? ? ? Dental ? ?(+) Edentulous Upper, Edentulous Lower ?  ?Pulmonary ?shortness of breath, with exertion and Long-Term Oxygen Therapy, COPD (2LPM ),  COPD inhaler and oxygen dependent, Current Smoker and Patient abstained from smoking.,  ?20 pack year history, down to 1/4ppd every few weeks ?Had sleep study a long time ago, doesn't remember results ?Still snores at night  ?Recent COPD exacerbation 06/07/21, still w/ productive cough ?Was evaluated by pulm at time of COPD exacerbation and treated ?  ?Pulmonary exam normal ?breath sounds clear to auscultation ? ? ? ? ? ? Cardiovascular ?hypertension, Pt. on medications ?Normal cardiovascular exam ?Rhythm:Regular Rate:Normal ? ? ?  ?Neuro/Psych ?negative neurological ROS ? negative psych ROS  ? GI/Hepatic ?negative GI ROS, Neg liver ROS,   ?Endo/Other  ?neg diabetesObesity BMI 35  ? Renal/GU ?negative Renal ROS  ?negative genitourinary ?  ?Musculoskeletal ? ?(+) Arthritis , Osteoarthritis and Rheumatoid disorders,  Chronic back pain   ? Abdominal ?(+) + obese,   ?Peds ? Hematology ?negative hematology ROS ?(+)   ?Anesthesia Other Findings ? ? Reproductive/Obstetrics ?negative OB ROS ? ?  ? ? ? ? ? ? ? ? ? ? ? ? ? ?  ?  ? ? ? ? ? ? ?Anesthesia Physical ?Anesthesia Plan ? ?ASA: 4 ? ?Anesthesia Plan: General  ? ?Post-op Pain Management: Ofirmev IV (intra-op)*  ? ?Induction: Intravenous ? ?PONV Risk Score and Plan: Ondansetron, Dexamethasone and Treatment may vary due to age or medical condition ? ?Airway Management Planned: Oral ETT ? ?Additional Equipment: None ? ?Intra-op Plan:  ? ?Post-operative Plan: Extubation in OR ? ?Informed Consent: I have reviewed the patients History and  Physical, chart, labs and discussed the procedure including the risks, benefits and alternatives for the proposed anesthesia with the patient or authorized representative who has indicated his/her understanding and acceptance.  ? ? ? ?Dental advisory given ? ?Plan Discussed with: CRNA ? ?Anesthesia Plan Comments: (Preoperative pulmonology evaluation by Dr. Erin Fulling on 06/07/21. He was treated for COPD exacerbation with Pulmicort nebulizer, prednisone taper (over 12 days), azithromycin. Given concerns for cost, he put him on scheduled nebulizer regimen--started Duonebs every 6 hours and started budesonide nebulizer BID. In regards to preoperative risk assessment, he wrote, "Patient is low to moderate risk for post-operative pulmonary complications based on ARISCAT risk index, this is a 1.6 to 13.3% chance of post-operatively pulmonary complications,?as defined by the occurrence of respiratory failure, respiratory infection, pleural effusion, atelectasis, pneumothorax, bronchospasm or aspiration pneumonitis." I did reach out to Dr. Erin Fulling on 06/13/21 to clarify timing of procedure under anesthesia given recent treatment for COPD exacerbation.  He added, "I would operate soon after this treatment, he is a non-compliant patient".  ? ?I advised the patient that I would consider him high risk for pulmonary complications considering he is still smoking, he noncompliant with treatments, is requiring home oxygen and had a COPD exacerbation 10d ago. I discussed with him that he is at higher than normal risk of requiring postoperative ventilation and an ICU stay. ?)  ? ? ? ? ?Anesthesia Quick Evaluation ? ?

## 2021-06-16 NOTE — Progress Notes (Signed)
TWO VISITORS ARE ALLOWED TO COME WITH YOU AND STAY IN THE SURGICAL WAITING ROOM ONLY DURING PRE OP AND PROCEDURE DAY OF SURGERY.  ? ?PCP - Orlinda Blalock, NP ?Cardiologist - Saw Coletta Memos, NP on 06/01/21 ?Oncology - Dr Betsy Coder ?Pulmonology - Dr Freda Jackson ? ?Chest x-ray - n/a ?EKG - 06/01/21 ?Stress Test - Years agp ?ECHO - 08/18/20 ?Cardiac Cath - n/an/a ? ?ICD Pacemaker/Loop - n/a ? ?Sleep Study -  n/a ?CPAP - none ? ?On oxygen 2L via n/c. ? ?Aspirin Instructions:  Last dose was on 06/01/21. ? ?Anesthesia review: Yes ? ?STOP now taking any Aspirin (unless otherwise instructed by your surgeon), Aleve, Naproxen, Ibuprofen, Motrin, Advil, Goody's, BC's, all herbal medications, fish oil, and all vitamins.  ? ?Coronavirus Screening ?Do you have any of the following symptoms:  ?Cough yes/no: No ?Fever (>100.58F)  yes/no: No ?Runny nose yes/no: No ?Sore throat yes/no: No ?Difficulty breathing/shortness of breath  yes/no: No ? ?Have you traveled in the last 14 days and where? yes/no: No ? ?Patient and wife Nathan Nicholson verbalized understanding of instructions that were given via phone. ?

## 2021-06-17 ENCOUNTER — Ambulatory Visit (HOSPITAL_BASED_OUTPATIENT_CLINIC_OR_DEPARTMENT_OTHER): Payer: Medicare HMO | Admitting: Vascular Surgery

## 2021-06-17 ENCOUNTER — Encounter (HOSPITAL_COMMUNITY)
Admission: RE | Disposition: A | Discharge status: Home / Self Care | Payer: Self-pay | Source: Home / Self Care | Attending: Neuroradiology

## 2021-06-17 ENCOUNTER — Ambulatory Visit (HOSPITAL_COMMUNITY)
Admission: RE | Admit: 2021-06-17 | Discharge: 2021-06-17 | Disposition: A | Discharge status: Home / Self Care | Payer: Medicare HMO | Attending: Neuroradiology | Admitting: Neuroradiology

## 2021-06-17 ENCOUNTER — Ambulatory Visit (HOSPITAL_COMMUNITY): Payer: Medicare HMO | Admitting: Vascular Surgery

## 2021-06-17 ENCOUNTER — Ambulatory Visit (HOSPITAL_COMMUNITY)
Admission: RE | Admit: 2021-06-17 | Discharge: 2021-06-17 | Disposition: A | Discharge status: Home / Self Care | Payer: Medicare HMO | Source: Ambulatory Visit | Attending: Radiology | Admitting: Radiology

## 2021-06-17 ENCOUNTER — Encounter (HOSPITAL_COMMUNITY): Payer: Self-pay

## 2021-06-17 DIAGNOSIS — Z993 Dependence on wheelchair: Secondary | ICD-10-CM | POA: Diagnosis not present

## 2021-06-17 DIAGNOSIS — Z8616 Personal history of COVID-19: Secondary | ICD-10-CM | POA: Insufficient documentation

## 2021-06-17 DIAGNOSIS — R451 Restlessness and agitation: Secondary | ICD-10-CM | POA: Diagnosis not present

## 2021-06-17 DIAGNOSIS — R0609 Other forms of dyspnea: Secondary | ICD-10-CM | POA: Diagnosis not present

## 2021-06-17 DIAGNOSIS — Z9081 Acquired absence of spleen: Secondary | ICD-10-CM | POA: Insufficient documentation

## 2021-06-17 DIAGNOSIS — R053 Chronic cough: Secondary | ICD-10-CM | POA: Diagnosis not present

## 2021-06-17 DIAGNOSIS — M4854XA Collapsed vertebra, not elsewhere classified, thoracic region, initial encounter for fracture: Secondary | ICD-10-CM | POA: Insufficient documentation

## 2021-06-17 DIAGNOSIS — E669 Obesity, unspecified: Secondary | ICD-10-CM | POA: Insufficient documentation

## 2021-06-17 DIAGNOSIS — F17210 Nicotine dependence, cigarettes, uncomplicated: Secondary | ICD-10-CM | POA: Diagnosis not present

## 2021-06-17 DIAGNOSIS — M545 Low back pain, unspecified: Secondary | ICD-10-CM

## 2021-06-17 DIAGNOSIS — Z9889 Other specified postprocedural states: Secondary | ICD-10-CM | POA: Insufficient documentation

## 2021-06-17 DIAGNOSIS — M069 Rheumatoid arthritis, unspecified: Secondary | ICD-10-CM | POA: Diagnosis not present

## 2021-06-17 DIAGNOSIS — G8929 Other chronic pain: Secondary | ICD-10-CM

## 2021-06-17 DIAGNOSIS — J449 Chronic obstructive pulmonary disease, unspecified: Secondary | ICD-10-CM

## 2021-06-17 DIAGNOSIS — Z96641 Presence of right artificial hip joint: Secondary | ICD-10-CM | POA: Insufficient documentation

## 2021-06-17 DIAGNOSIS — I10 Essential (primary) hypertension: Secondary | ICD-10-CM

## 2021-06-17 DIAGNOSIS — Z7952 Long term (current) use of systemic steroids: Secondary | ICD-10-CM | POA: Insufficient documentation

## 2021-06-17 DIAGNOSIS — Z9981 Dependence on supplemental oxygen: Secondary | ICD-10-CM | POA: Diagnosis not present

## 2021-06-17 DIAGNOSIS — Z6834 Body mass index (BMI) 34.0-34.9, adult: Secondary | ICD-10-CM | POA: Diagnosis not present

## 2021-06-17 DIAGNOSIS — Z79899 Other long term (current) drug therapy: Secondary | ICD-10-CM | POA: Diagnosis not present

## 2021-06-17 DIAGNOSIS — M0500 Felty's syndrome, unspecified site: Secondary | ICD-10-CM | POA: Insufficient documentation

## 2021-06-17 DIAGNOSIS — M8008XA Age-related osteoporosis with current pathological fracture, vertebra(e), initial encounter for fracture: Secondary | ICD-10-CM | POA: Insufficient documentation

## 2021-06-17 DIAGNOSIS — M549 Dorsalgia, unspecified: Secondary | ICD-10-CM | POA: Diagnosis not present

## 2021-06-17 DIAGNOSIS — R634 Abnormal weight loss: Secondary | ICD-10-CM | POA: Diagnosis not present

## 2021-06-17 DIAGNOSIS — Z87891 Personal history of nicotine dependence: Secondary | ICD-10-CM | POA: Diagnosis not present

## 2021-06-17 DIAGNOSIS — M1990 Unspecified osteoarthritis, unspecified site: Secondary | ICD-10-CM | POA: Diagnosis not present

## 2021-06-17 DIAGNOSIS — R69 Illness, unspecified: Secondary | ICD-10-CM | POA: Diagnosis not present

## 2021-06-17 DIAGNOSIS — Z7982 Long term (current) use of aspirin: Secondary | ICD-10-CM | POA: Insufficient documentation

## 2021-06-17 HISTORY — PX: IR KYPHO THORACIC WITH BONE BIOPSY: IMG5518

## 2021-06-17 HISTORY — PX: RADIOLOGY WITH ANESTHESIA: SHX6223

## 2021-06-17 HISTORY — PX: IR KYPHO EA ADDL LEVEL THORACIC OR LUMBAR: IMG5520

## 2021-06-17 HISTORY — DX: Hyperlipidemia, unspecified: E78.5

## 2021-06-17 HISTORY — DX: Dependence on supplemental oxygen: Z99.81

## 2021-06-17 LAB — CBC
HCT: 45.1 % (ref 39.0–52.0)
Hemoglobin: 14.4 g/dL (ref 13.0–17.0)
MCH: 28.2 pg (ref 26.0–34.0)
MCHC: 31.9 g/dL (ref 30.0–36.0)
MCV: 88.4 fL (ref 80.0–100.0)
Platelets: 327 10*3/uL (ref 150–400)
RBC: 5.1 MIL/uL (ref 4.22–5.81)
RDW: 16 % — ABNORMAL HIGH (ref 11.5–15.5)
WBC: 12.6 10*3/uL — ABNORMAL HIGH (ref 4.0–10.5)
nRBC: 0 % (ref 0.0–0.2)

## 2021-06-17 LAB — BASIC METABOLIC PANEL
Anion gap: 8 (ref 5–15)
BUN: 9 mg/dL (ref 8–23)
CO2: 26 mmol/L (ref 22–32)
Calcium: 9 mg/dL (ref 8.9–10.3)
Chloride: 102 mmol/L (ref 98–111)
Creatinine, Ser: 0.75 mg/dL (ref 0.61–1.24)
GFR, Estimated: >60 mL/min (ref >60)
Glucose, Bld: 104 mg/dL — ABNORMAL HIGH (ref 70–99)
Potassium: 4.8 mmol/L (ref 3.5–5.1)
Sodium: 136 mmol/L (ref 135–145)

## 2021-06-17 LAB — PROTIME-INR
INR: 1 (ref 0.8–1.2)
Prothrombin Time: 13.3 seconds (ref 11.4–15.2)

## 2021-06-17 IMAGING — XA IR KYPHO VERTEBRAL THORACIC AUGMENTATION
11 of 13 series · 12 of 17 positions shown · non-contrast
Comparison: MRI of the thoracic spine 14.

INDICATION: 66 y.o. male with history of COPD (intermittent home oxygen use),
smoking history, rheumatoid arthritis with Felty syndrome (s/p
splenectomy [UU]) who was recently admitted to [REDACTED] from
[DATE] to [DATE] for back pain. MRI thoracic spine demonstrated
subacute compression fractures of T11 and T12. He endorses
approximately 2 months of back pain without no inciting event or
trauma. The pain has progressively worsened. He is taking oxycodone
occasionally with minimal relief. He is having to sleep in a
recliner and is wheelchair bound. He reports approximately 30 pounds
weight loss over the past several months, which he attributes to
lack of appetite due to severity of back pain. He has no other
oncologic history. He presents today for T11 and T12 kyphoplasty and
bone biopsies.

EXAM:
FLUOROSCOPY GUIDED T11 AND T12 CORE BONE BIOPSY AND KYPHOPLASTY

[Series 3: single · 1 of 2 slices shown (1 of 2)]
[im 1/2]
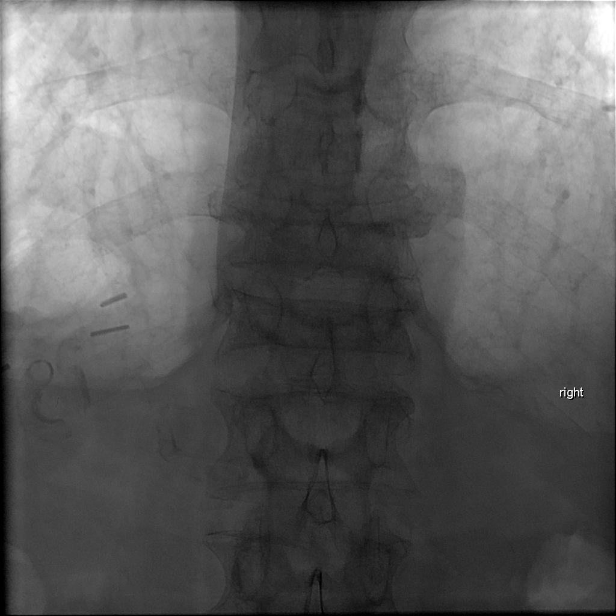

[Series 4: fl neuro n · 1 of 1 slices shown (1 of 9)]
[im 1/1]
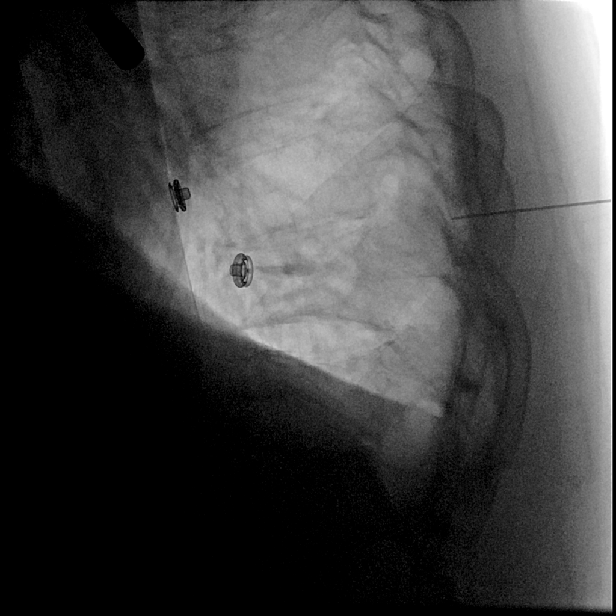

[Series 5: fl neuro n · 1 of 1 slices shown (2 of 9)]
[im 1/1]
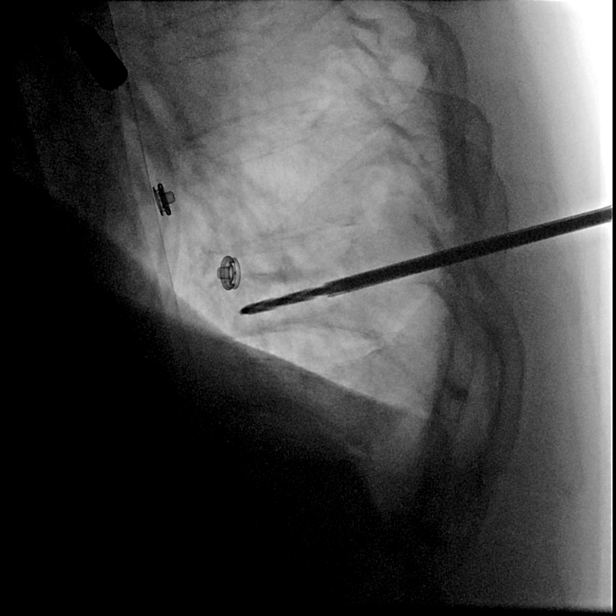

[Series 6: fl neuro n · 1 of 1 slices shown (3 of 9)]
[im 1/1]
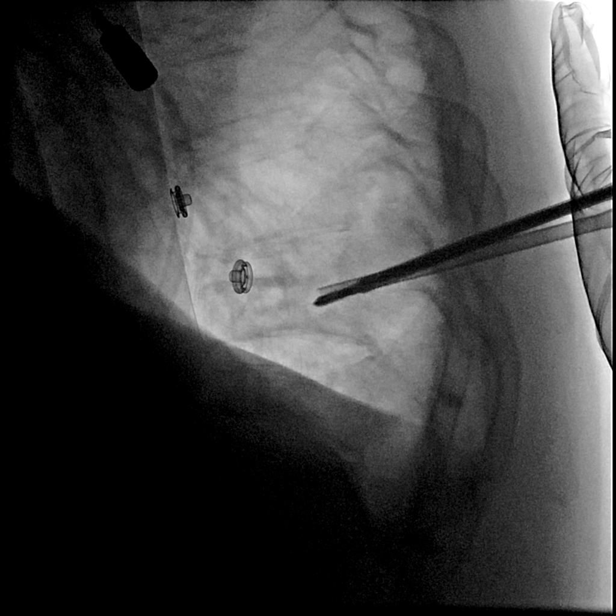

[Series 8: fl neuro n · 1 of 1 slices shown (4 of 9)]
[im 1/1]
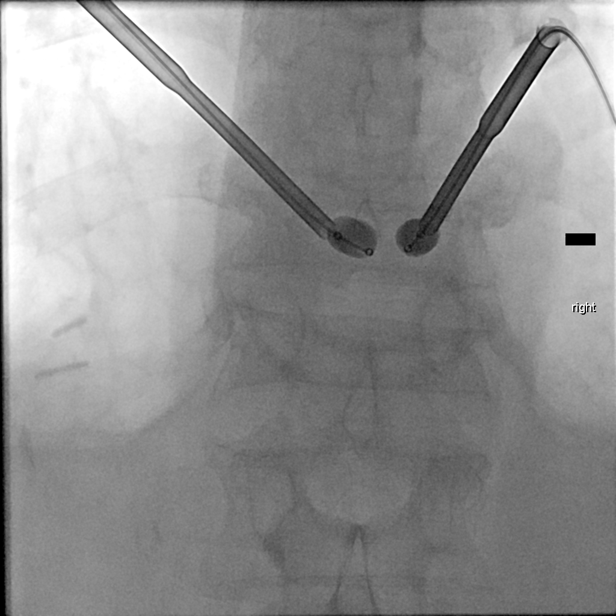

[Series 9: fl neuro n · 1 of 1 slices shown (5 of 9)]
[im 1/1]
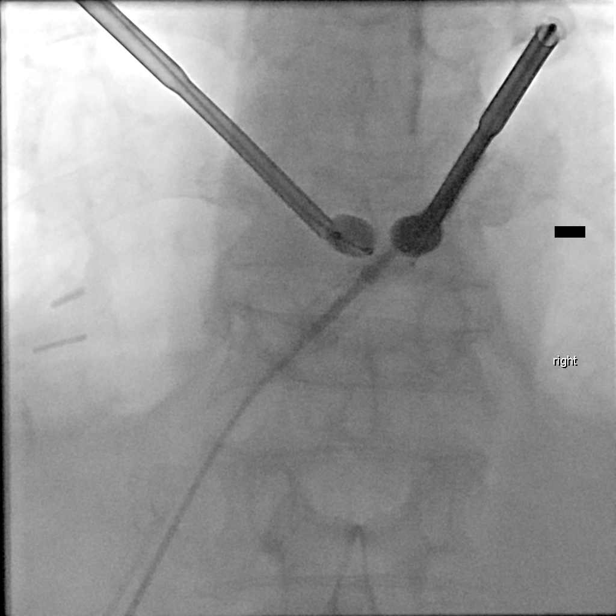

[Series 10: fl neuro n · 1 of 2 slices shown (6 of 9)]
[im 2/2]
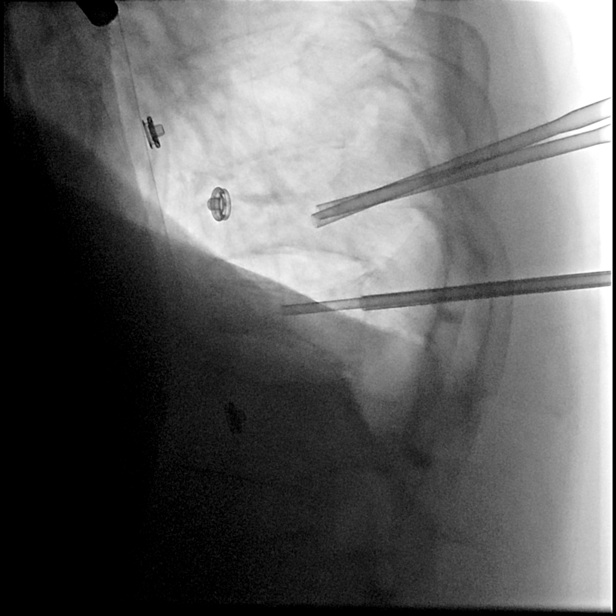

[Series 11: fl neuro n · 1 of 1 slices shown (7 of 9)]
[im 1/1]
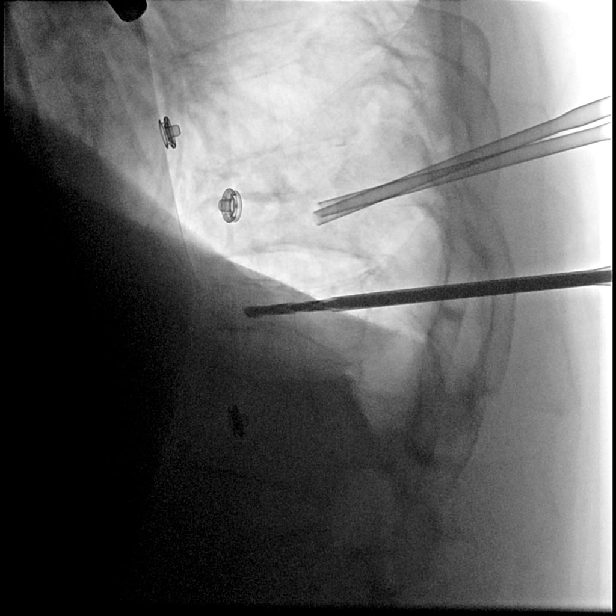

[Series 13: fl neuro n · 1 of 1 slices shown (8 of 9)]
[im 1/1]
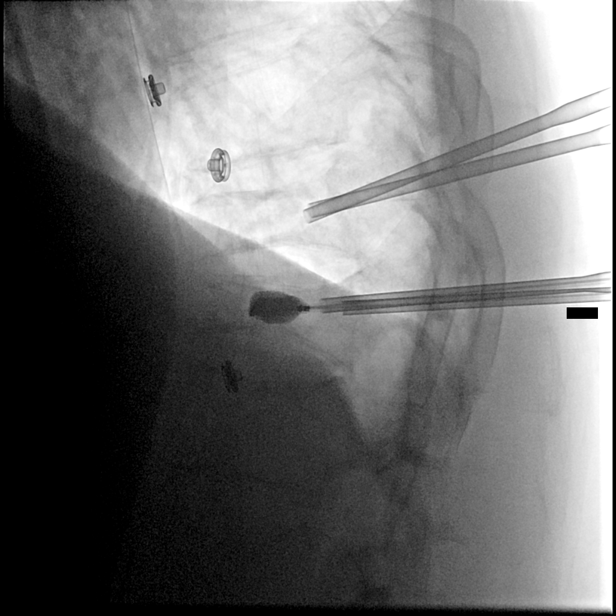

[Series 14: fl neuro n · 2 of 2 slices shown (9 of 9)]
[im 1/2]
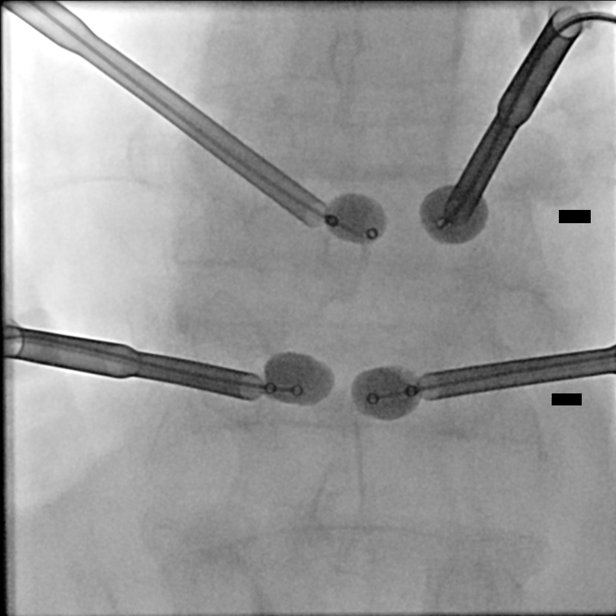
[im 2/2]
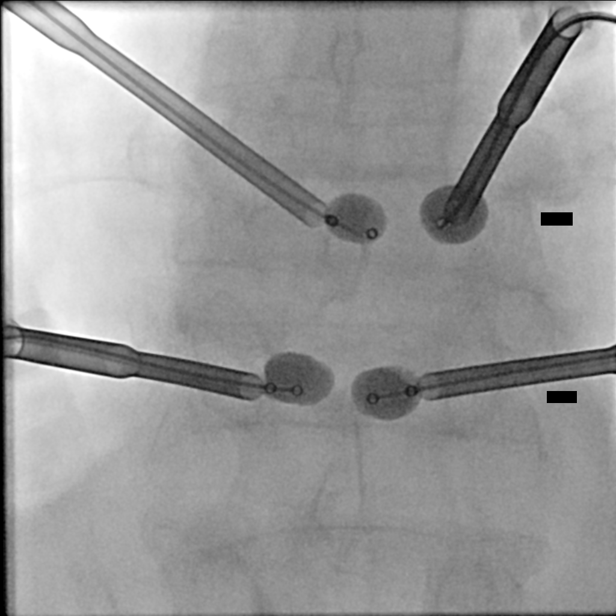

[Series 15: single · 1 of 2 slices shown (2 of 2)]
[im 2/2]
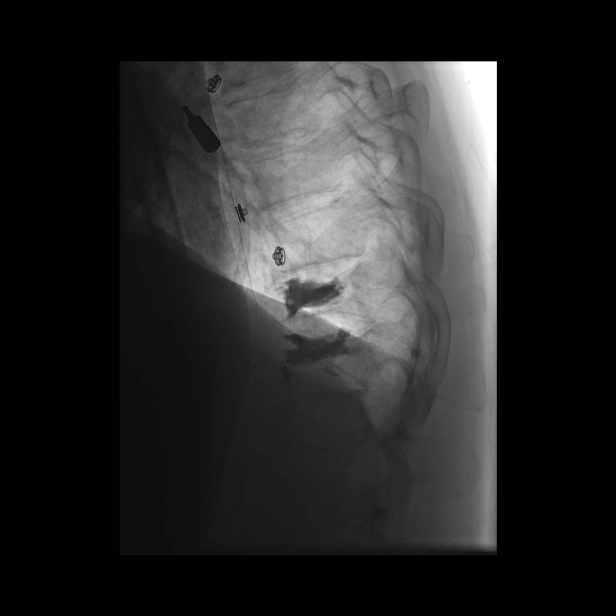

[12 of 17 positions shown; findings below may reference images not displayed]

[UU]; MRI of the lumbar
spine [DATE].

MEDICATIONS:
As antibiotic prophylaxis, Ancef 2 g IV was ordered pre-procedure
and administered intravenously within 1 hour of incision. All
current medications are in the EMR and have been reviewed as part of
this encounter.

ANESTHESIA/SEDATION:
The procedure was performed under general anesthesia.

FLUOROSCOPY:
Radiation Exposure Index (as provided by the fluoroscopic device):
2,679 mGy Kerma

COMPLICATIONS:
None immediate.

PROCEDURE:
Following a full explanation of the procedure along with the
potential associated complications, an informed witnessed consent
was obtained.

The patient was placed in prone position on the angiography table.
The thoracic spine region was prepped and draped in a sterile
fashion.

Under fluoroscopy, the T11 vertebral body was delineated and the
skin area was marked. The skin was infiltrated with a 1% Lidocaine
approximately 3 cm lateral to the spinous process projection on the
right. Using a 22-gauge spinal needle, the soft issue and the
peripedicular space and periosteum were infiltrated with Bupivacaine
0.5%. A skin incision was made at the access site.

Subsequently, an 11-gauge Kyphon trocar was inserted under
fluoroscopic guidance until contact with the pedicle was obtained.
The trocar was inserted under light YELTSIN into the pedicle
until the posterior boundaries of the vertebral body was reached.
The diamond mandrill was removed and one core biopsy wasobtained.

The skin was infiltrated with a 1% Lidocaine approximately 3 cm
lateral to the T11 spinous process projection on the left. Using a
22-gauge spinal needle, the soft issue and the peripedicular space
and periosteum were infiltrated with Bupivacaine 0.5%. A skin
incision was made at the access site. Subsequently, an 11-gauge
Kyphon trocar was inserted under fluoroscopic guidance until contact
with the pedicle was obtained. The trocar was inserted under light
YELTSIN into the pedicle until the posterior boundaries of
the vertebral body was reached. The diamond mandrill was removed.

Under fluoroscopy, the T12 vertebral body was delineated and the
skin area was marked. The skin was infiltrated with a 1% Lidocaine
approximately 3.5 cm lateral to the spinous process projection on
the right. Using a 22-gauge spinal needle, the soft issue and the
peripedicular space and periosteum were infiltrated with Bupivacaine
0.5%. A skin incision was made at the access site.

Subsequently, an 11-gauge Kyphon trocar was inserted under
fluoroscopic guidance until contact with the pedicle was obtained.
The trocar was inserted under light YELTSIN into the pedicle
until the posterior boundaries of the vertebral body was reached.
The diamond mandrill was removed and one core biopsy wasobtained.

The skin was infiltrated with a 1% Lidocaine approximately 3.5 cm
lateral to the T12 spinous process projection on the left. Using a
22-gauge spinal needle, the soft issue and the peripedicular space
and periosteum were infiltrated with Bupivacaine 0.5%. A skin
incision was made at the access site. Subsequently, an 11-gauge
Kyphon trocar was inserted under fluoroscopic guidance until contact
with the pedicle was obtained. The trocar was inserted under light
YELTSIN into the pedicle until the posterior boundaries of
the vertebral body was reached. The diamond mandrill was removed.

A bone drill was coaxially advanced within the anterior third of
each vertebral body on each side and then exchanged for inflatable
Kyphon balloons. These were centered within the mid-aspect of the
vertebral bodies. The balloons were inflated to create a void to
serve as a repository for the bone cement. Both balloons were
deflated and through both cannulas, under continuous fluoroscopy
guidance in the AP and lateral views, the T11 vertebral body was
filled with previously mixed polymethyl-methacrylate (PMMA) added to
barium for opacification. Then, using the same technique, the T12
vertebral body was filled with PMMA. Both cannulas were later
removed.

The access sites were cleaned and covered with a sterile bandage.
IMPRESSION: 1. Successful fluoroscopy-guided bilateral transpedicular approach
for T11 and T12 vertebral bodies core bone biopsy and kyphoplasty
for treatment of osteoporotic fragility fracture. Bone samples
obtained were sent for pathology analysis.
2. If the patient has known osteoporosis, recommend treatment as
clinically indicated. If the patient's bone density status is
unknown, DEXA scan is recommended.

## 2021-06-17 SURGERY — IR WITH ANESTHESIA
Anesthesia: General

## 2021-06-17 MED ORDER — PHENYLEPHRINE HCL-NACL 20-0.9 MG/250ML-% IV SOLN
INTRAVENOUS | Status: DC | PRN
Start: 2021-06-17 — End: 2021-06-17
  Administered 2021-06-17: 30 ug/min via INTRAVENOUS

## 2021-06-17 MED ORDER — LIDOCAINE HCL (PF) 1 % IJ SOLN
INTRAMUSCULAR | Status: AC | PRN
  Administered 2021-06-17: 15 mL via INTRADERMAL

## 2021-06-17 MED ORDER — ACETAMINOPHEN 500 MG PO TABS: 1000.0000 mg | ORAL_TABLET | Freq: Once | ORAL | Status: DC

## 2021-06-17 MED ORDER — PHENYLEPHRINE 40 MCG/ML (10ML) SYRINGE FOR IV PUSH (FOR BLOOD PRESSURE SUPPORT)
PREFILLED_SYRINGE | INTRAVENOUS | Status: DC | PRN
  Administered 2021-06-17 (×2): 80 ug via INTRAVENOUS

## 2021-06-17 MED ORDER — HYDROMORPHONE HCL 1 MG/ML IJ SOLN
0.2500 mg | INTRAMUSCULAR | Status: DC | PRN
  Administered 2021-06-17 (×2): 0.5 mg via INTRAVENOUS

## 2021-06-17 MED ORDER — SUCCINYLCHOLINE CHLORIDE 200 MG/10ML IV SOSY
PREFILLED_SYRINGE | INTRAVENOUS | Status: DC | PRN
  Administered 2021-06-17: 140 mg via INTRAVENOUS

## 2021-06-17 MED ORDER — BUPIVACAINE HCL (PF) 0.5 % IJ SOLN
INTRAMUSCULAR | Status: AC
  Filled 2021-06-17: qty 30

## 2021-06-17 MED ORDER — BUPIVACAINE HCL (PF) 0.5 % IJ SOLN
INTRAMUSCULAR | Status: AC | PRN
  Administered 2021-06-17: 15 mL

## 2021-06-17 MED ORDER — IOHEXOL 300 MG/ML  SOLN: 100.0000 mL | Freq: Once | INTRAMUSCULAR | Status: DC | PRN

## 2021-06-17 MED ORDER — LIDOCAINE 2% (20 MG/ML) 5 ML SYRINGE
INTRAMUSCULAR | Status: DC | PRN
  Administered 2021-06-17: 60 mg via INTRAVENOUS

## 2021-06-17 MED ORDER — BUPIVACAINE HCL (PF) 0.5 % IJ SOLN
INTRAMUSCULAR | Status: AC
Start: 2021-06-17 — End: 2021-06-17
  Filled 2021-06-17: qty 30

## 2021-06-17 MED ORDER — AMISULPRIDE (ANTIEMETIC) 5 MG/2ML IV SOLN: 10.0000 mg | Freq: Once | INTRAVENOUS | Status: DC | PRN

## 2021-06-17 MED ORDER — ONDANSETRON HCL 4 MG/2ML IJ SOLN
INTRAMUSCULAR | Status: DC | PRN
  Administered 2021-06-17: 4 mg via INTRAVENOUS

## 2021-06-17 MED ORDER — EPHEDRINE SULFATE-NACL 50-0.9 MG/10ML-% IV SOSY
PREFILLED_SYRINGE | INTRAVENOUS | Status: DC | PRN
  Administered 2021-06-17: 5 mg via INTRAVENOUS

## 2021-06-17 MED ORDER — HYDROMORPHONE HCL 1 MG/ML IJ SOLN
INTRAMUSCULAR | Status: AC
  Filled 2021-06-17: qty 1

## 2021-06-17 MED ORDER — FENTANYL CITRATE (PF) 100 MCG/2ML IJ SOLN
INTRAMUSCULAR | Status: DC | PRN
  Administered 2021-06-17 (×2): 50 ug via INTRAVENOUS

## 2021-06-17 MED ORDER — FENTANYL CITRATE (PF) 100 MCG/2ML IJ SOLN
INTRAMUSCULAR | Status: AC
  Filled 2021-06-17: qty 2

## 2021-06-17 MED ORDER — OXYCODONE HCL 5 MG PO TABS: 5.0000 mg | ORAL_TABLET | Freq: Once | ORAL | Status: DC | PRN

## 2021-06-17 MED ORDER — LIDOCAINE HCL (PF) 1 % IJ SOLN
INTRAMUSCULAR | Status: AC
  Filled 2021-06-17: qty 30

## 2021-06-17 MED ORDER — CHLORHEXIDINE GLUCONATE 0.12 % MT SOLN
15.0000 mL | OROMUCOSAL | Status: AC
  Administered 2021-06-17: 15 mL via OROMUCOSAL
  Filled 2021-06-17: qty 15

## 2021-06-17 MED ORDER — PROPOFOL 10 MG/ML IV BOLUS
INTRAVENOUS | Status: DC | PRN
  Administered 2021-06-17: 130 mg via INTRAVENOUS

## 2021-06-17 MED ORDER — CEFAZOLIN SODIUM-DEXTROSE 2-4 GM/100ML-% IV SOLN
2.0000 g | INTRAVENOUS | Status: AC
  Administered 2021-06-17: 2 g via INTRAVENOUS
  Filled 2021-06-17: qty 100

## 2021-06-17 MED ORDER — SODIUM CHLORIDE 0.9 % IV SOLN: INTRAVENOUS | Status: DC

## 2021-06-17 MED ORDER — ONDANSETRON HCL 4 MG/2ML IJ SOLN: 4.0000 mg | Freq: Once | INTRAMUSCULAR | Status: DC | PRN

## 2021-06-17 MED ORDER — OXYCODONE HCL 5 MG/5ML PO SOLN: 5.0000 mg | Freq: Once | ORAL | Status: DC | PRN

## 2021-06-17 MED ORDER — LACTATED RINGERS IV SOLN: INTRAVENOUS | Status: DC

## 2021-06-17 MED ORDER — ROCURONIUM BROMIDE 10 MG/ML (PF) SYRINGE
PREFILLED_SYRINGE | INTRAVENOUS | Status: DC | PRN
Start: 2021-06-17 — End: 2021-06-17
  Administered 2021-06-17: 70 mg via INTRAVENOUS

## 2021-06-17 NOTE — Anesthesia Procedure Notes (Signed)
Procedure Name: Intubation ?Date/Time: 06/17/2021 9:25 AM ?Performed by: Georgia Duff, CRNA ?Pre-anesthesia Checklist: Patient identified, Emergency Drugs available, Suction available and Patient being monitored ?Patient Re-evaluated:Patient Re-evaluated prior to induction ?Oxygen Delivery Method: Circle System Utilized ?Preoxygenation: Pre-oxygenation with 100% oxygen ?Induction Type: IV induction ?Ventilation: Mask ventilation without difficulty ?Laryngoscope Size: Sabra Heck and 2 ?Grade View: Grade I ?Tube type: Oral ?Tube size: 7.5 mm ?Number of attempts: 1 ?Airway Equipment and Method: Stylet and Oral airway ?Placement Confirmation: ETT inserted through vocal cords under direct vision, positive ETCO2 and breath sounds checked- equal and bilateral ?Secured at: 21 cm ?Tube secured with: Tape ?Dental Injury: Teeth and Oropharynx as per pre-operative assessment  ? ? ? ? ?

## 2021-06-17 NOTE — Anesthesia Postprocedure Evaluation (Signed)
Anesthesia Post Note ? ?Patient: Nathan Nicholson ? ?Procedure(s) Performed: KYPHOPLASTY ? ?  ? ?Patient location during evaluation: PACU ?Anesthesia Type: General ?Level of consciousness: awake and alert, oriented and patient cooperative ?Pain management: pain level controlled ?Vital Signs Assessment: post-procedure vital signs reviewed and stable ?Respiratory status: spontaneous breathing, nonlabored ventilation and respiratory function stable ?Cardiovascular status: blood pressure returned to baseline and stable ?Postop Assessment: no apparent nausea or vomiting ?Anesthetic complications: no ? ? ?No notable events documented. ? ?Last Vitals:  ?Vitals:  ? 06/17/21 1210 06/17/21 1225  ?BP: (!) 140/94 (!) 140/94  ?Pulse: 84 92  ?Resp: 19 19  ?Temp:    ?SpO2: 97% 96%  ?  ?Last Pain:  ?Vitals:  ? 06/17/21 1210  ?TempSrc:   ?PainSc: 6   ? ? ?  ?  ?  ?  ?  ?  ? ?[name] ? ? ? ? ?

## 2021-06-17 NOTE — Procedures (Signed)
INTERVENTIONAL NEURORADIOLOGY BRIEF POSTPROCEDURE NOTE ? ?FLUOROSCOPY GUIDED T11 AND T12 CORE BIOPSY AND KYPHOPLASTY ? ?Attending: Dr. Baldemar Lenis ? 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 ?Access site: Percutaneous. ? ?Anesthesia: GETA ? ?Medication used: Refer to anesthesia documentation. ? ?Complications: None. ? ?Estimated blood loss: None. ? ?Specimen: T11 and T12 core bone biopsy. ? ?Bilateral transpedicular approach utilized for bolloon kyphoplasty at T11 and T12.  ? ?The patient tolerated the procedure well without incident or complication and is in stable condition.  ? ?  ?

## 2021-06-17 NOTE — H&P (Signed)
? ?Chief Complaint: ?Patient was seen in consultation today for T11-T12 compression fractures at the request of Dr. Elby Showers ? ?Supervising Physician: Baldemar Lenis ? ?Patient Status: [facility] - Out-pt ? ?History of Present Illness: ?Nathan Nicholson is a 66 y.o. male known to IR from previous hospital admission last month and evaluated in clinic for kyphoplasty and bone biopsies with Dr. Elby Showers. ?He is a 66 y.o. 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  MRI thoracic spine demonstrated subacute compression fractures of T11 and T12.  He endorses approximately 2 months of back pain without no inciting event or trauma.  The pain has progressively worsened.  He is taking oxycodone occasionally with minimal relief.  He is having to sleep in a recliner and is wheelchair bound.  He reports approximately 30 pounds weight loss over the past several months, which he attributes to lack of appetite due to severity of back pain.  He has no other oncologic history. He continues to smoke cigarettes, difficult to quantify on interview.  Uncertain if he is still taking ASA.   ?He presents to [facility] today for T11 and T12 kyphoplasty and bone biopsies.  Endorses chronic cough but no sense of SOB.  He has recently been treated for COPD exacerbation and is on a current steroid taper.  ? ? ?Past Medical History:  ?Diagnosis Date  ? COPD (chronic obstructive pulmonary disease) (HCC)   ? COVID-19   ? 03-2020  ? Dyspnea   ? with exertion  ? HLD (hyperlipidemia)   ? Hypertension   ? Oxygen dependent   ? 2L via [place]  ? Pneumonia   ? Rheumatoid arthritis (HCC)   ? ? ?Past Surgical History:  ?Procedure Laterality Date  ? FOOT SURGERY    ? left  ? HERNIA REPAIR    ? IR RADIOLOGIST EVAL & MGMT  05/18/2021  ? MULTIPLE TOOTH EXTRACTIONS    ? ALL TEETH EXT, DENTURES   ? SPLENECTOMY    ? TOOTH EXTRACTION    ? TOTAL HIP ARTHROPLASTY Right 08/17/2020  ? Procedure: TOTAL HIP ARTHROPLASTY ANTERIOR APPROACH;  Surgeon: Durene Romans, MD;  Location: [facility] ORS;  Service: Orthopedics;  Laterality: Right;  70 min  ? ? ?Allergies: ?Patient has no known allergies. ? ?Medications: ?Prior to Admission medications   ?Medication Sig Start Date End Date Taking? Authorizing Provider  ?albuterol (VENTOLIN HFA) 108 (90 Base) MCG/ACT inhaler Inhale 2 puffs into the lungs every 4 (four) hours as needed (Breathing). 04/15/21  Yes [provider]  ?aspirin 81 MG EC tablet Take 81 mg by mouth every morning.   Yes [provider]  ?atorvastatin (LIPITOR) 10 MG tablet Take 10 mg by mouth every morning.   Yes [provider]  ?budesonide (PULMICORT) 0.5 MG/2ML nebulizer solution Take 2 mLs (0.5 mg total) by nebulization 2 (two) times daily. ?Patient taking differently: Take 0.5 mg by nebulization 3 (three) times daily. 06/07/21  Yes [name], MD  ?cyanocobalamin (,VITAMIN B-12,) 1000 MCG/ML injection Inject 1,000 mcg into the muscle every 30 (thirty) days.   Yes [provider]  ?folic acid (FOLVITE) 1 MG tablet Take 1 mg by mouth every morning.   Yes [provider]  ?ipratropium-albuterol (DUONEB) 0.5-2.5 (3) MG/3ML SOLN Take 3 mLs by nebulization 3 (three) times daily.   Yes [provider]  ?  lisinopril-hydrochlorothiazide (ZESTORETIC) 20-12.5 MG tablet Take 1 tablet by mouth daily.   Yes [provider]  ?meloxicam (MOBIC) 15 MG tablet Take 15 mg by mouth daily. 06/02/21  Yes [provider]  ?methotrexate (RHEUMATREX) 2.5 MG tablet Take 10 mg by mouth every Tuesday. Caution:Chemotherapy. Protect from light.   Yes [provider]  ?oxyCODONE (OXY IR/ROXICODONE) 5 MG immediate release tablet Take 1-2 tablets (5-10 mg total) by mouth every 6 (six) hours as needed for breakthrough pain or severe pain. 06/09/21 08/08/21 Yes  Ladene ArtistSherrill, Gary B, MD  ?OXYGEN Inhale 2 L into the lungs continuous.   Yes [provider]  ?predniSONE (DELTASONE) 10 MG tablet Take 4 tablets (40 mg total) by mouth daily with breakfast for 3 days, THEN 3 tablets (30 mg total) daily with breakfast for 3 days, THEN 2 tablets (20 mg total) daily with breakfast for 3 days, THEN 1 tablet (10 mg total) daily with breakfast for 3 days. 06/07/21 06/19/21 Yes Martina Sinnerewald, Jonathan B, MD  ?senna-docusate (SENOKOT-S) 8.6-50 MG tablet Take 1 tablet by mouth 2 (two) times daily as needed for moderate constipation. ?Patient taking differently: Take 1 tablet by mouth daily. 05/13/21  Yes Darlin DropHall, Carole N, DO  ?Skin Protectants, Misc. (EUCERIN) cream Apply 1 application. topically daily.   Yes [provider]  ?Vitamin D, Ergocalciferol, (DRISDOL) 1.25 MG (50000 UNIT) CAPS capsule Take 50,000 Units by mouth every Monday.   Yes [provider]  ?azithromycin (ZITHROMAX) 250 MG tablet Take as directed ?Patient not taking: Reported on 06/13/2021 06/07/21   Martina Sinnerewald, Jonathan B, MD  ?nicotine (NICODERM CQ - DOSED IN MG/24 HOURS) 14 mg/24hr patch Place 1 patch  onto the skin daily. ?Patient not taking: Reported on 06/13/2021 05/13/21 05/13/22  Darlin DropHall, Carole N, DO  ?polyethylene glycol (MIRALAX / GLYCOLAX) 17 g packet Take 17 g by mouth daily as needed for mild constipation. ?Patient not taking: Reported on 06/13/2021 05/13/21   Darlin DropHall, Carole N, DO  ?  ? ?Family History  ?Problem Relation Age of Onset  ? Heart attack Brother   ? ? ?Social History  ? ?Socioeconomic History  ? Marital status: Married  ?  Spouse name: Nathan Nicholson  ? Number of children: 2  ? Years of education: Not on file  ? Highest education level: Not on file  ?Occupational History  ? Not on file  ?Tobacco Use  ? Smoking status: Every Day  ?  Packs/day: 0.50  ?  Years: 40.00  ?  Pack years: 20.00  ?  Types: Cigarettes  ? Smokeless tobacco: Never  ?Vaping Use  ? Vaping Use: Never used  ?Substance and Sexual Activity   ? Alcohol use: Not Currently  ?  Comment: rare  ? Drug use: Never  ? Sexual activity: Not Currently  ?Other Topics Concern  ? Not on file  ?Social History Narrative  ? Not on file  ? ?Social Determinants of Health  ? ?Financial Resource Strain: Not on file  ?Food Insecurity: Not on file  ?Transportation Needs: Not on file  ?Physical Activity: Not on file  ?Stress: Not on file  ?Social Connections: Not on file  ? ? ?Review of Systems: A 12 point ROS discussed and pertinent positives are indicated in the HPI above.  All other systems are negative. ? ?Review of Systems  ?Constitutional:  Positive for activity change.  ?HENT: Negative.    ?Respiratory:  Positive for cough.   ?Cardiovascular: Negative.   ?Gastrointestinal: Negative.   ?Endocrine: Negative.   ?  Genitourinary: Negative.   ?Musculoskeletal:  Positive for back pain.  ?Allergic/Immunologic: Negative.   ?Neurological: Negative.   ?Hematological: Negative.   ?Psychiatric/Behavioral: Negative.    ? ?Vital Signs: ?BP (!) 154/79   Pulse 94   Temp 98.1 ?F (36.7 ?C) (Oral)   Resp 18   Ht  (1.6 m)   Wt 197 lb (89.4 kg)   SpO2 95%   BMI 34.90 kg/m?  ? ?Physical Exam ?Constitutional:   ?   General: He is awake.  ?   Appearance: He is ill-appearing.  ?   Interventions: Nasal cannula in place.  ?HENT:  ?   Head: Normocephalic.  ?   Mouth/Throat:  ?   Mouth: Mucous membranes are moist.  ?   Pharynx: Oropharynx is clear.  ?Eyes:  ?   Conjunctiva/sclera: Conjunctivae normal.  ?Cardiovascular:  ?   Rate and Rhythm: Normal rate.  ?   Pulses: Normal pulses.  ?Pulmonary:  ?   Effort: Pulmonary effort is normal.  ?Skin: ?   General: Skin is dry.  ?Neurological:  ?   General: No focal deficit present.  ?Psychiatric:     ?   Behavior: Behavior is agitated. Behavior is cooperative.  ? ? ?Imaging: ?IR Radiologist Eval & Mgmt ? ?Result Date: 05/18/2021 ?Please refer to notes tab for details about interventional procedure. (Op Note)  ? ?Labs: ? ?CBC: ?Recent Labs  ?   05/10/21 ?9562 05/11/21 ?1308 05/12/21 ?6578 06/17/21 ?4696  ?WBC 12.8* 14.6* 12.1* 12.6*  ?HGB 12.9* 12.9* 12.9* 14.4  ?HCT 39.5 41.3 40.6 45.1  ?PLT 341 368 372 327  ? ? ?COAGS: ?Recent Labs  ?  08/17/20 ?1228 03/03/

## 2021-06-17 NOTE — Progress Notes (Signed)
Interventional Brief Note: ? ?Patient assessed at bedside s/p kyphoplasty with Dr. Tommie Sams.  He has been extubated and is now on supplemental O2, stable vital signs.  Recovering well.  Some mild back pain related to procedure.  Has tolerating liquids and light snack.  ? ?Ready for discharge home once recovery period concluded.  ? ?Loyce Dys, MS RD PA-C ? ? ?

## 2021-06-17 NOTE — Progress Notes (Signed)
Wasted Dilaudid 1mg  IV with Atha Starks, RN as witness.  ?

## 2021-06-17 NOTE — Sedation Documentation (Signed)
Pt transported to PACU bay 9 via stretcher accompanied by RN and CRNA. Heather RN at bedside to receive pt. Handoff complete. Pt drowsy but easily arousable. No s/s of distress at this time.  ?

## 2021-06-17 NOTE — Transfer of Care (Signed)
Immediate Anesthesia Transfer of Care Note ? ?Patient: Nathan Nicholson ? ?Procedure(s) Performed: KYPHOPLASTY ? ?Patient Location: PACU ? ?Anesthesia Type:General ? ?Level of Consciousness: drowsy ? ?Airway & Oxygen Therapy: Patient Spontanous Breathing and Patient connected to nasal cannula oxygen ? ?Post-op Assessment: Report given to RN and Post -op Vital signs reviewed and stable ? ?Post vital signs: Reviewed and stable ? ?Last Vitals:  ?Vitals Value Taken Time  ?BP 168/107 06/17/21 1145  ?Temp    ?Pulse 87 06/17/21 1145  ?Resp 22 06/17/21 1145  ?SpO2 99 % 06/17/21 1145  ?Vitals shown include unvalidated device data. ? ?Last Pain:  ?Vitals:  ? 06/17/21 0640  ?TempSrc:   ?PainSc: 5   ?   ? ?Patients Stated Pain Goal: 0 (06/17/21 0640) ? ?Complications: No notable events documented. ?

## 2021-06-20 ENCOUNTER — Encounter (HOSPITAL_COMMUNITY): Payer: Self-pay | Admitting: Neuroradiology

## 2021-06-23 DIAGNOSIS — J441 Chronic obstructive pulmonary disease with (acute) exacerbation: Secondary | ICD-10-CM | POA: Diagnosis not present

## 2021-06-23 DIAGNOSIS — R0602 Shortness of breath: Secondary | ICD-10-CM | POA: Diagnosis not present

## 2021-06-23 DIAGNOSIS — Z20822 Contact with and (suspected) exposure to covid-19: Secondary | ICD-10-CM | POA: Diagnosis not present

## 2021-06-23 DIAGNOSIS — R059 Cough, unspecified: Secondary | ICD-10-CM | POA: Diagnosis not present

## 2021-06-23 DIAGNOSIS — J9811 Atelectasis: Secondary | ICD-10-CM | POA: Diagnosis not present

## 2021-06-29 ENCOUNTER — Other Ambulatory Visit (HOSPITAL_COMMUNITY): Payer: Self-pay | Admitting: Neuroradiology

## 2021-06-29 DIAGNOSIS — M546 Pain in thoracic spine: Secondary | ICD-10-CM

## 2021-06-30 ENCOUNTER — Other Ambulatory Visit: Payer: Self-pay | Admitting: Student

## 2021-06-30 DIAGNOSIS — M8008XS Age-related osteoporosis with current pathological fracture, vertebra(e), sequela: Secondary | ICD-10-CM

## 2021-06-30 MED ORDER — LORAZEPAM 1 MG PO TABS: 1.0000 mg | ORAL_TABLET | ORAL | 0 refills | Status: DC | PRN

## 2021-06-30 NOTE — Progress Notes (Signed)
1 mg PO lorazepam called to patient's pharmacy for upcoming MR scan. ? ?Wife aware. ? ?Brynda Greathouse, MS RD PA-C ?9:54 AM ? ? ?

## 2021-07-01 ENCOUNTER — Encounter (HOSPITAL_COMMUNITY): Payer: Self-pay

## 2021-07-01 ENCOUNTER — Emergency Department (HOSPITAL_COMMUNITY): Payer: Medicare HMO

## 2021-07-01 ENCOUNTER — Ambulatory Visit (HOSPITAL_COMMUNITY): Payer: Medicare HMO

## 2021-07-01 ENCOUNTER — Emergency Department (HOSPITAL_COMMUNITY)
Admission: EM | Admit: 2021-07-01 | Discharge: 2021-07-01 | Disposition: A | Discharge status: Home / Self Care | Payer: Medicare HMO | Attending: Emergency Medicine | Admitting: Emergency Medicine

## 2021-07-01 ENCOUNTER — Other Ambulatory Visit: Payer: Self-pay | Admitting: Student

## 2021-07-01 ENCOUNTER — Ambulatory Visit (HOSPITAL_COMMUNITY)
Admission: RE | Admit: 2021-07-01 | Discharge: 2021-07-01 | Disposition: A | Discharge status: Home / Self Care | Payer: Medicare HMO | Source: Ambulatory Visit | Attending: Neuroradiology | Admitting: Neuroradiology

## 2021-07-01 ENCOUNTER — Other Ambulatory Visit: Payer: Self-pay

## 2021-07-01 ENCOUNTER — Telehealth (HOSPITAL_COMMUNITY): Payer: Self-pay | Admitting: Radiology

## 2021-07-01 DIAGNOSIS — R350 Frequency of micturition: Secondary | ICD-10-CM | POA: Diagnosis not present

## 2021-07-01 DIAGNOSIS — M546 Pain in thoracic spine: Secondary | ICD-10-CM | POA: Insufficient documentation

## 2021-07-01 DIAGNOSIS — S3992XD Unspecified injury of lower back, subsequent encounter: Secondary | ICD-10-CM | POA: Diagnosis not present

## 2021-07-01 DIAGNOSIS — J449 Chronic obstructive pulmonary disease, unspecified: Secondary | ICD-10-CM | POA: Insufficient documentation

## 2021-07-01 DIAGNOSIS — R000 Tachycardia, unspecified: Secondary | ICD-10-CM | POA: Diagnosis not present

## 2021-07-01 DIAGNOSIS — X58XXXD Exposure to other specified factors, subsequent encounter: Secondary | ICD-10-CM | POA: Diagnosis not present

## 2021-07-01 DIAGNOSIS — M549 Dorsalgia, unspecified: Secondary | ICD-10-CM | POA: Diagnosis not present

## 2021-07-01 DIAGNOSIS — M4846XD Fatigue fracture of vertebra, lumbar region, subsequent encounter for fracture with routine healing: Secondary | ICD-10-CM

## 2021-07-01 DIAGNOSIS — M8008XS Age-related osteoporosis with current pathological fracture, vertebra(e), sequela: Secondary | ICD-10-CM

## 2021-07-01 DIAGNOSIS — D72829 Elevated white blood cell count, unspecified: Secondary | ICD-10-CM | POA: Insufficient documentation

## 2021-07-01 DIAGNOSIS — S32018D Other fracture of first lumbar vertebra, subsequent encounter for fracture with routine healing: Secondary | ICD-10-CM | POA: Diagnosis not present

## 2021-07-01 DIAGNOSIS — Z7982 Long term (current) use of aspirin: Secondary | ICD-10-CM | POA: Diagnosis not present

## 2021-07-01 DIAGNOSIS — S32010B Wedge compression fracture of first lumbar vertebra, initial encounter for open fracture: Secondary | ICD-10-CM | POA: Diagnosis not present

## 2021-07-01 DIAGNOSIS — S32028D Other fracture of second lumbar vertebra, subsequent encounter for fracture with routine healing: Secondary | ICD-10-CM | POA: Insufficient documentation

## 2021-07-01 DIAGNOSIS — R69 Illness, unspecified: Secondary | ICD-10-CM | POA: Diagnosis not present

## 2021-07-01 LAB — CBC WITH DIFFERENTIAL/PLATELET
Abs Immature Granulocytes: 0.38 10*3/uL — ABNORMAL HIGH (ref 0.00–0.07)
Basophils Absolute: 0.1 10*3/uL (ref 0.0–0.1)
Basophils Relative: 1 %
Eosinophils Absolute: 0.2 10*3/uL (ref 0.0–0.5)
Eosinophils Relative: 1 %
HCT: 44.3 % (ref 39.0–52.0)
Hemoglobin: 14.1 g/dL (ref 13.0–17.0)
Immature Granulocytes: 1 %
Lymphocytes Relative: 22 %
Lymphs Abs: 6.8 10*3/uL — ABNORMAL HIGH (ref 0.7–4.0)
MCH: 27.6 pg (ref 26.0–34.0)
MCHC: 31.8 g/dL (ref 30.0–36.0)
MCV: 86.7 fL (ref 80.0–100.0)
Monocytes Absolute: 4.2 10*3/uL — ABNORMAL HIGH (ref 0.1–1.0)
Monocytes Relative: 14 %
Neutro Abs: 19.3 10*3/uL — ABNORMAL HIGH (ref 1.7–7.7)
Neutrophils Relative %: 61 %
Platelets: 428 10*3/uL — ABNORMAL HIGH (ref 150–400)
RBC: 5.11 MIL/uL (ref 4.22–5.81)
RDW: 16.5 % — ABNORMAL HIGH (ref 11.5–15.5)
Smear Review: ADEQUATE
WBC: 31.1 10*3/uL — ABNORMAL HIGH (ref 4.0–10.5)
nRBC: 0 % (ref 0.0–0.2)

## 2021-07-01 LAB — BASIC METABOLIC PANEL
Anion gap: 9 (ref 5–15)
BUN: 21 mg/dL (ref 8–23)
CO2: 30 mmol/L (ref 22–32)
Calcium: 9.1 mg/dL (ref 8.9–10.3)
Chloride: 99 mmol/L (ref 98–111)
Creatinine, Ser: 0.74 mg/dL (ref 0.61–1.24)
GFR, Estimated: >60 mL/min (ref >60)
Glucose, Bld: 87 mg/dL (ref 70–99)
Potassium: 3.4 mmol/L — ABNORMAL LOW (ref 3.5–5.1)
Sodium: 138 mmol/L (ref 135–145)

## 2021-07-01 LAB — LACTIC ACID, PLASMA
Lactic Acid, Venous: 1.3 mmol/L (ref 0.5–1.9)
Lactic Acid, Venous: 1.7 mmol/L (ref 0.5–1.9)

## 2021-07-01 LAB — C-REACTIVE PROTEIN: CRP: 0.8 mg/dL (ref <1.0)

## 2021-07-01 LAB — SEDIMENTATION RATE: Sed Rate: 4 mm/hr (ref 0–16)

## 2021-07-01 IMAGING — MR MR LUMBAR SPINE WO/W CM
4 of 7 series · 19 of 48 positions shown · IV contrast (gadavist)
Comparison: [DATE]

CLINICAL DATA: Spine surgery/procedure, postop, infection
suspected; severe back pain with recent kyphoplasty

EXAM:
MRI LUMBAR SPINE WITHOUT AND WITH CONTRAST
TECHNIQUE: Multiplanar and multiecho pulse sequences of the lumbar spine were
obtained without and with intravenous contrast.
CONTRAST:  8mL GADAVIST GADOBUTROL 1 MMOL/ML IV SOLN

[Series 18: T2 · sagittal · 4.0mm · 0.55mm/px · 4 of 15 slices shown (1 of 2)]
[im 1/15]
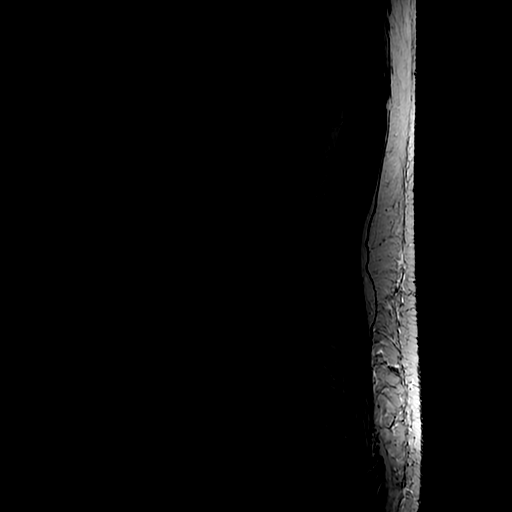
[im 5/15]
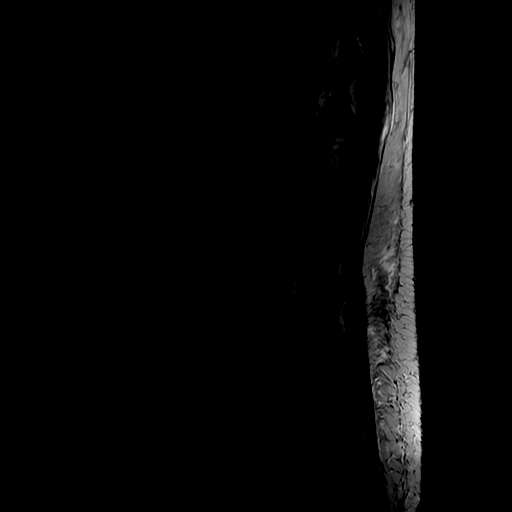
[im 10/15]
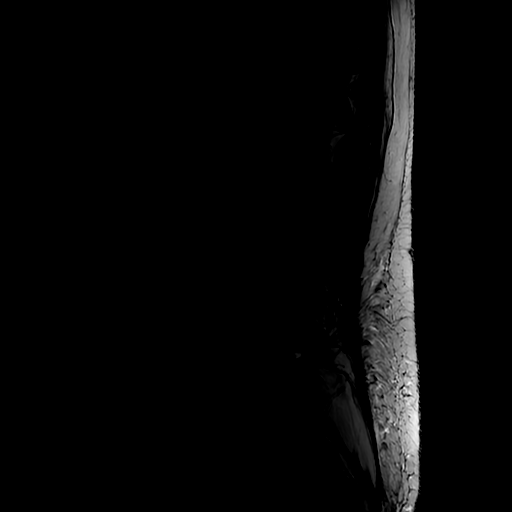
[im 15/15]
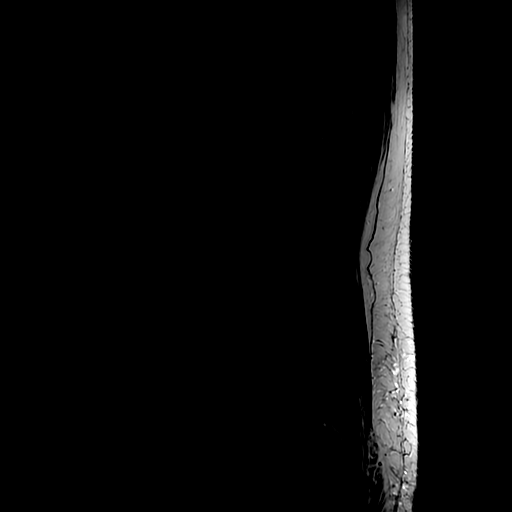

[Series 20: T1 · sagittal · 4.0mm · 0.55mm/px · 4 of 15 slices shown (1 of 2)]
[im 1/15]
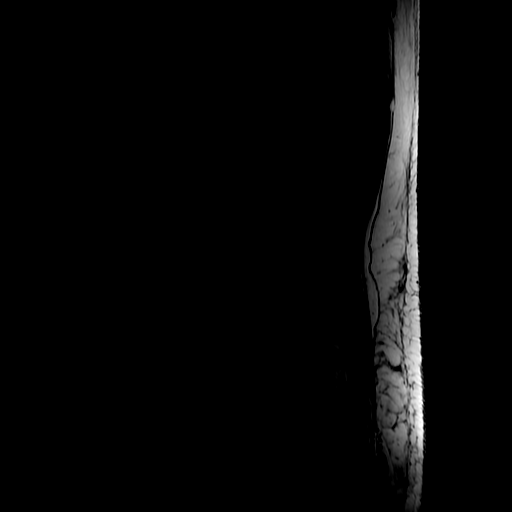
[im 4/15]
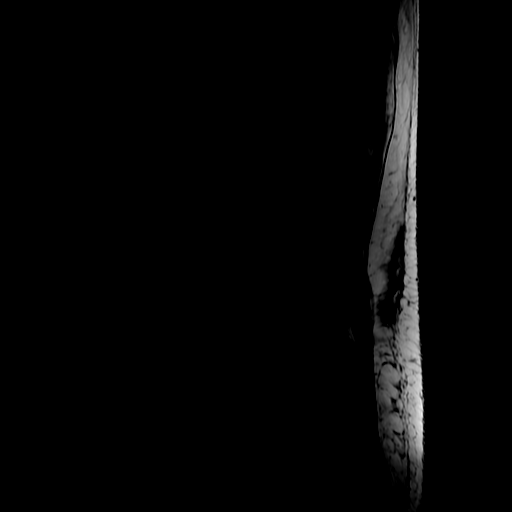
[im 8/15]
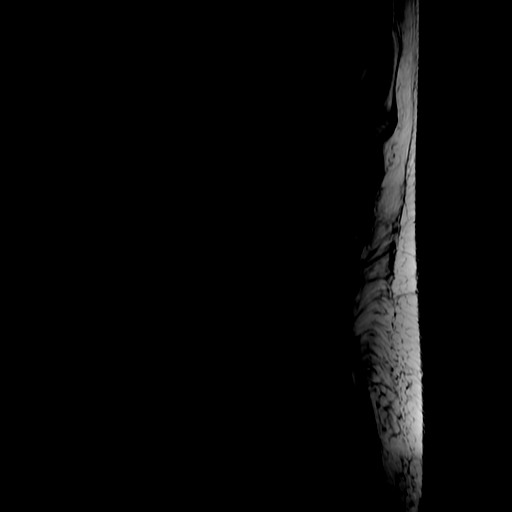
[im 15/15]
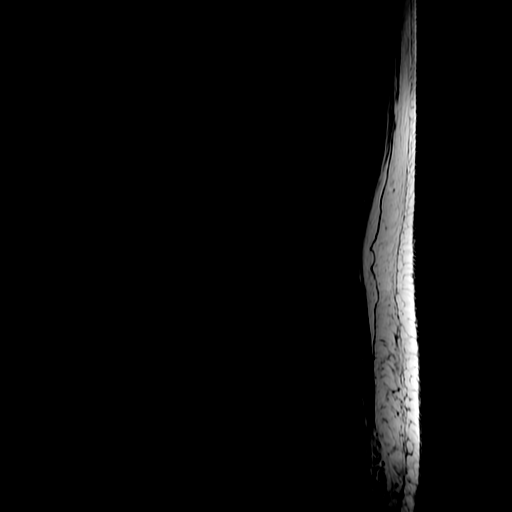

[Series 21: T2 · axial · 4.0mm · 0.39mm/px · z∈[-499,-304]mm · 8 of 33 slices shown (2 of 2)]
[im 1/33]
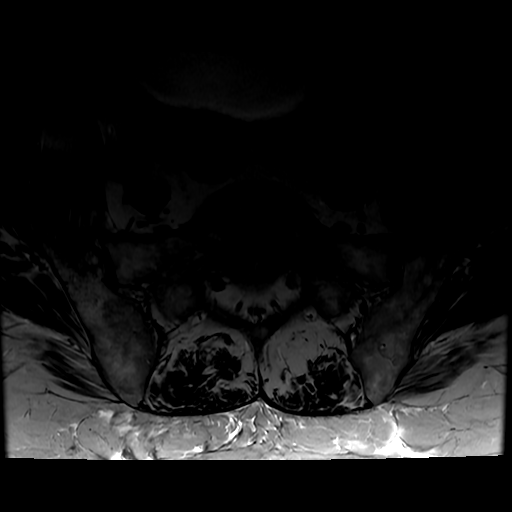
[im 4/33]
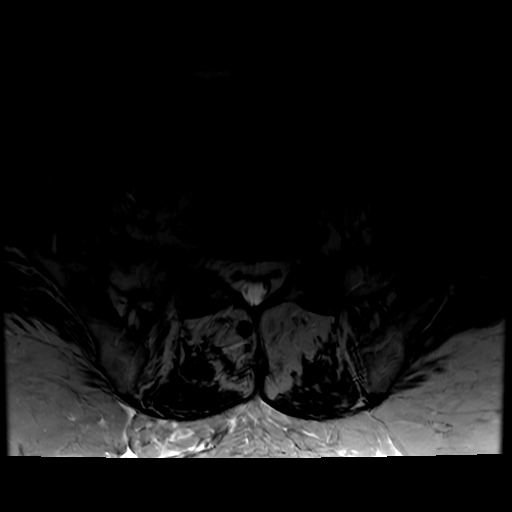
[im 11/33]
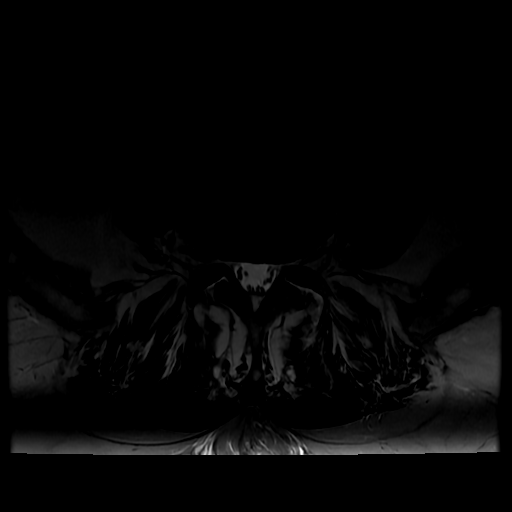
[im 15/33]
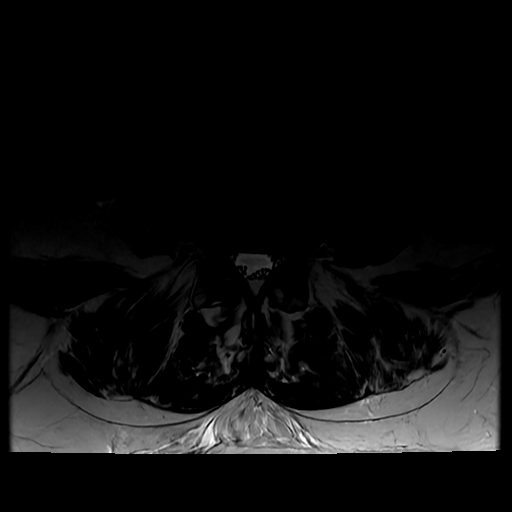
[im 18/33]
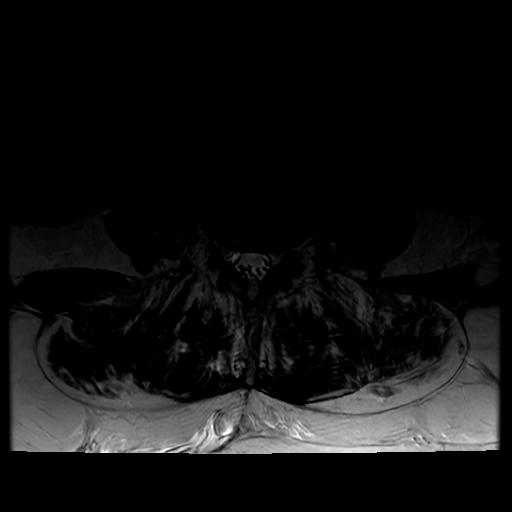
[im 22/33]
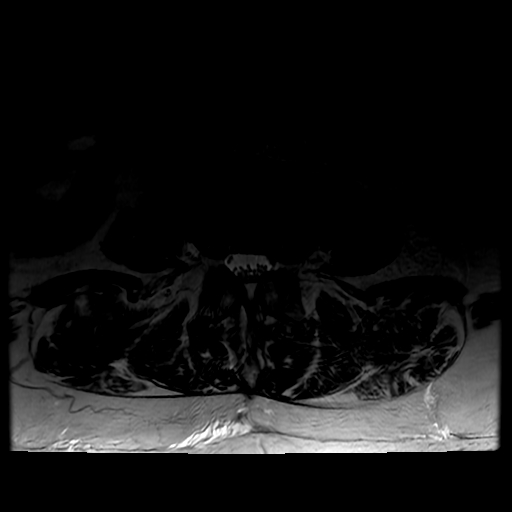
[im 29/33]
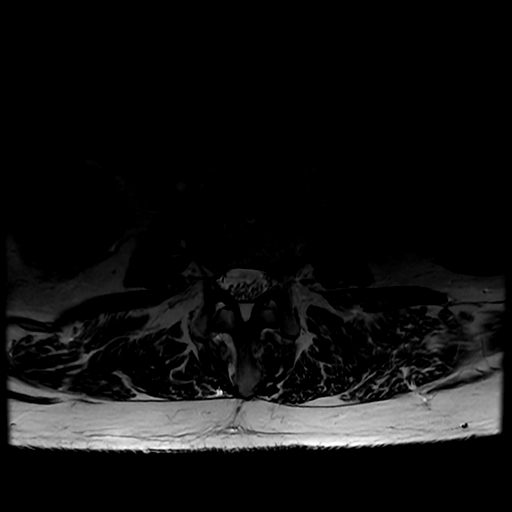
[im 33/33]
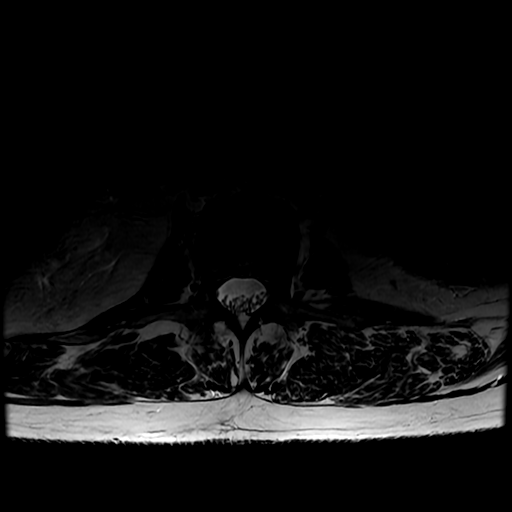

[Series 22: T1 · axial · 4.0mm · 0.39mm/px · z∈[-485,-324]mm · 3 of 31 slices shown (2 of 2)]
[im 4/31]
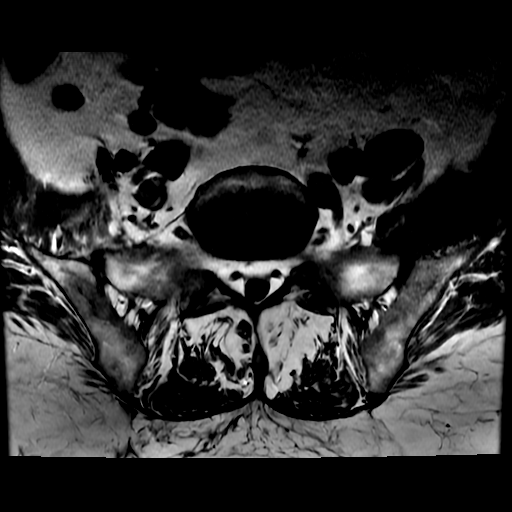
[im 16/31]
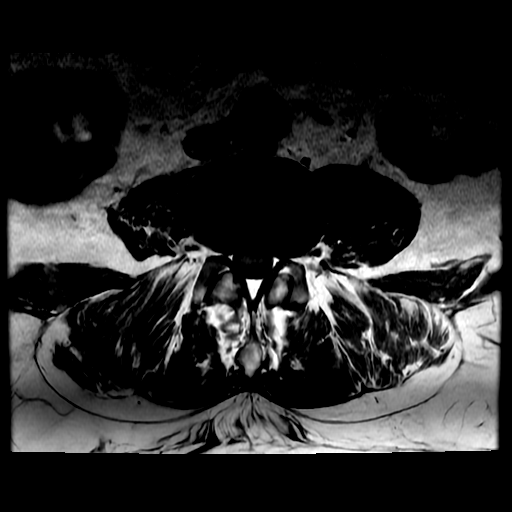
[im 27/31]
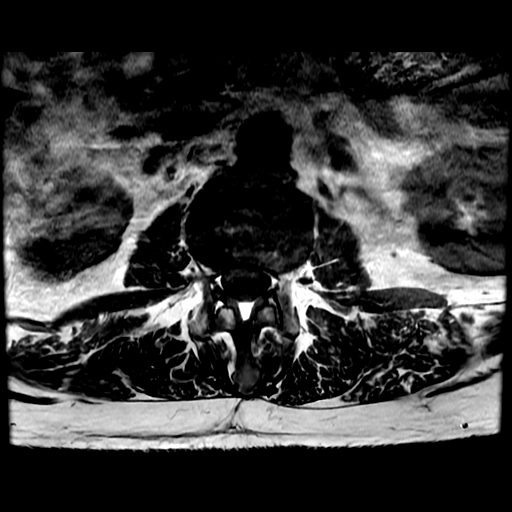

[19 of 48 positions shown; findings below may reference images not displayed]

FINDINGS: Segmentation:  Standard.

Alignment:  Stable.

Vertebrae: New mild L2 compression fracture with less than 50% loss
of height at the superior endplate. New minor L1 compression
fracture with mild wedging. There is associated marrow edema at
these levels. Treated T11 and T12 compression fractures. There is
unchanged mild retropulsion at T12.

Conus medullaris and cauda equina: Conus extends to the T12 level.
Conus and cauda equina appear normal. No abnormal intrathecal
enhancement.

Paraspinal and other soft tissues: Unremarkable.

Disc levels: Mild multilevel degenerative changes are stable in
appearance over the short interval. No new or progressive stenosis.
IMPRESSION: New acute/subacute minor L1 and mild L2 compression fractures. No
endplate retropulsion.

Post T11 and T12 kyphoplasties. Vertebral body height loss,
alignment, and mild retropulsion are unchanged.

## 2021-07-01 IMAGING — MR IR KYPHO VERTEBRAL LUMBAR AUGMENTATION
4 of 9 series · 15 of 48 positions shown · IV contrast (Yes GAD)
Comparison: MRI of the cervical, thoracic and lumbar spine SPELL

INDICATION: SPELL is a 66-year-old male with past medical history
significant for COPD (intermittent home oxygen use), smoking
history, rheumatoid arthritis with Felty syndrome (s/p splenectomy
[M4],) and recent T 11 and T 12 compression fx, status post
kyphoplasty and bone biopsy under general anesthesia on [DATE]. Of
note, pathology revealed no evidence of malignancy. Patient
tolerated T11 and T12 SPELL well, however, recovery was complicated by
persistent severe back pain. MRI of spine was ordered for further
evaluation; however, patient was not able to tolerate MRI as
outpatient due severe back pain. Patient was advised to go to ED for
pain management and MRI with higher level of pain control/sedation,
and underwent MRI of C,T,L spine on [DATE] which revealed new T10, L1
and L2 compression fractures. Patient was evaluated on [DATE] and
was found to head severe back pain limiting activities of daily
life. He has moderately severe pain in rest and severe pain with any
movement. After peer to peer review, insurance has approved
vertebral augmentation at the L1 and L2 levels.

EXAM:
FLUOROSCOPY GUIDED L1 AND L2 CORE BONE BIOPSY AND BALLOON
KYPHOPLASTY

[Series 9: T1 · sagittal · 3.0mm · 0.90mm/px · 3 of 12 slices shown (1 of 2)]
[im 1/12]
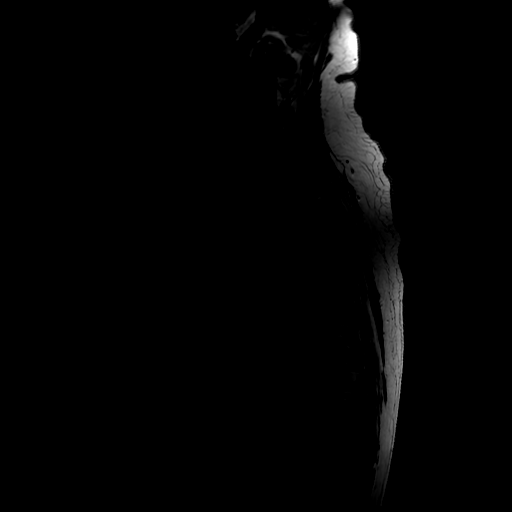
[im 6/12]
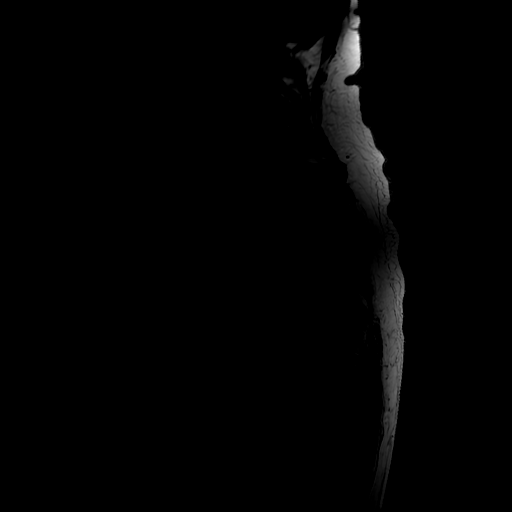
[im 12/12]
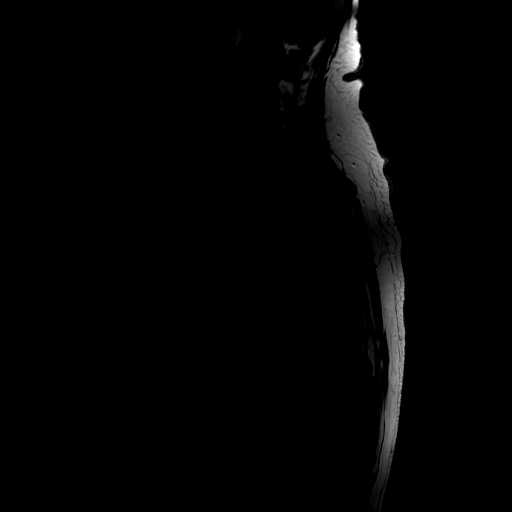

[Series 11: T2 · sagittal · 3.0mm · 0.66mm/px · 4 of 17 slices shown (1 of 2)]
[im 1/17]
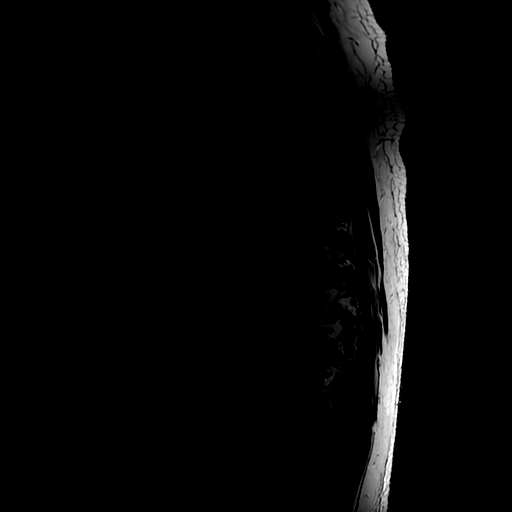
[im 6/17]
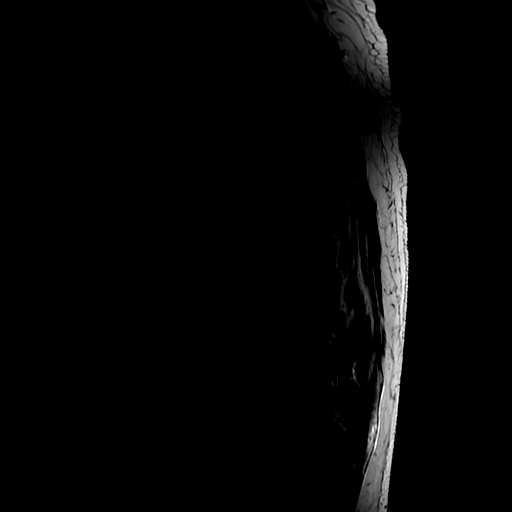
[im 11/17]
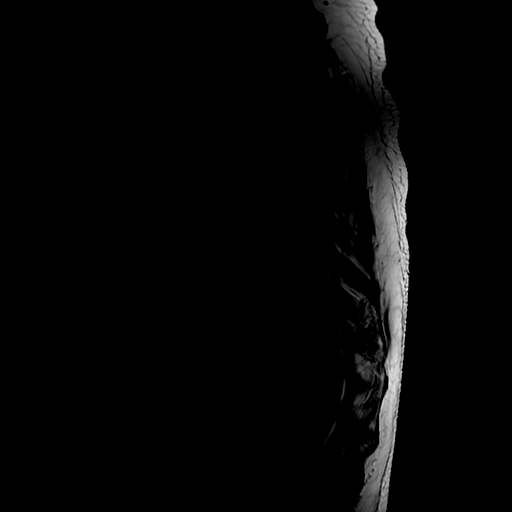
[im 17/17]
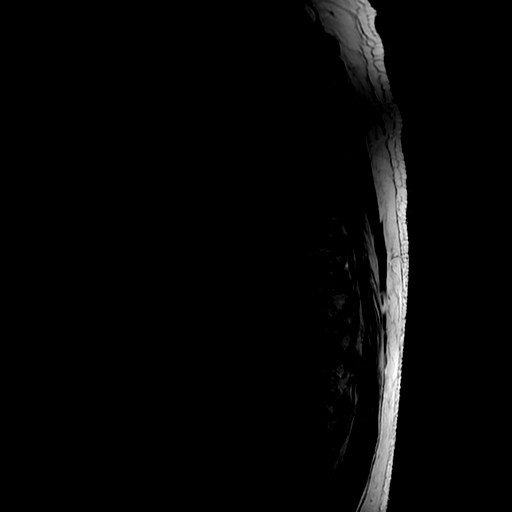

[Series 12: T1 · sagittal · 3.0mm · 0.66mm/px · 3 of 17 slices shown (2 of 2)]
[im 1/17]
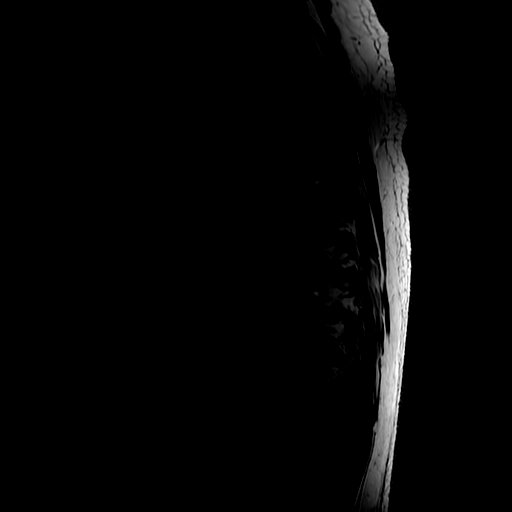
[im 9/17]
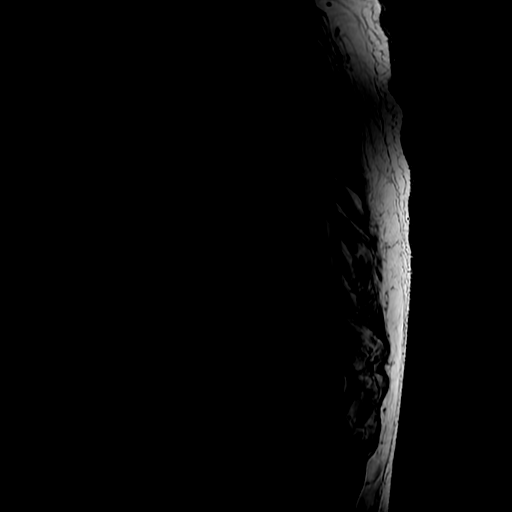
[im 17/17]
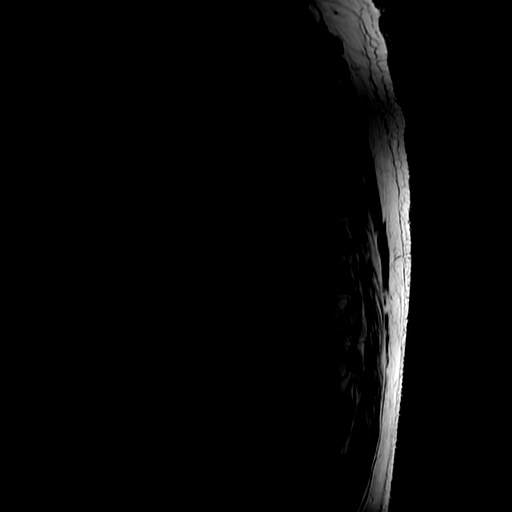

[Series 14: T2 · axial · 4.0mm · 0.39mm/px · z∈[-319,-117]mm · 5 of 47 slices shown (2 of 2)]
[im 1/47]
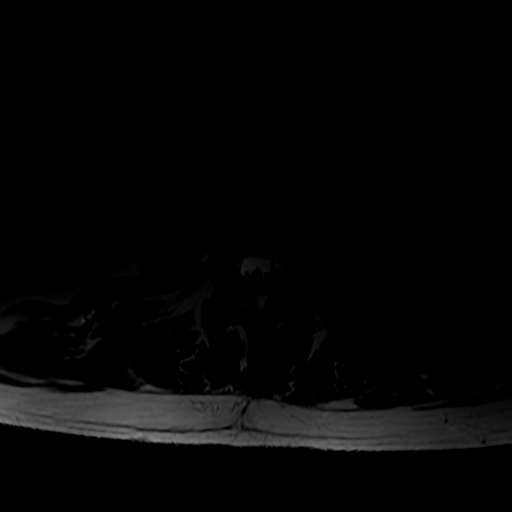
[im 7/47]
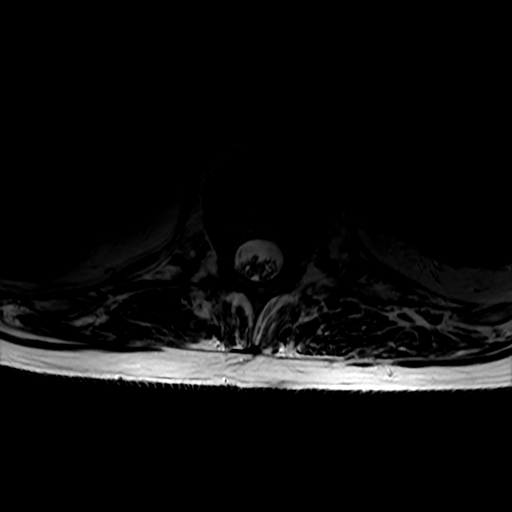
[im 14/47]
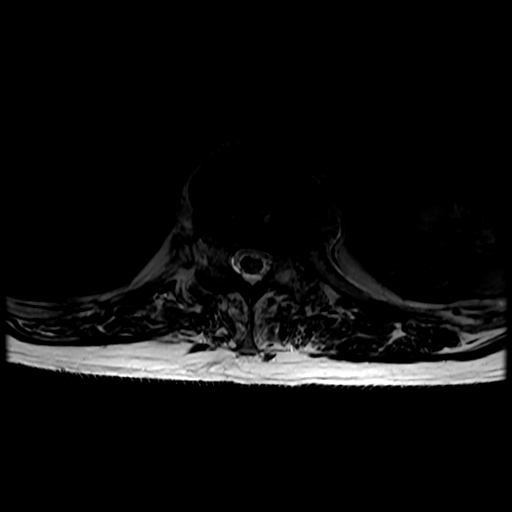
[im 27/47]
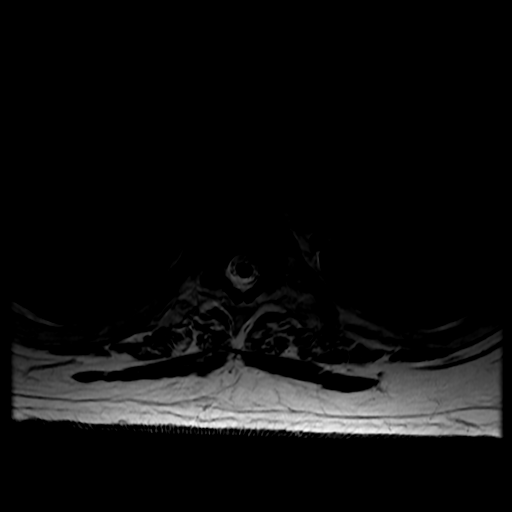
[im 40/47]
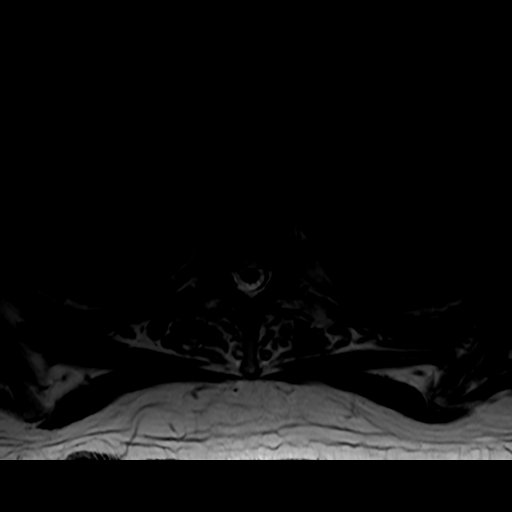

[15 of 48 positions shown; findings below may reference images not displayed]

MEDICATIONS:
As antibiotic prophylaxis, Ancef 2 g IV was ordered pre-procedure
and administered intravenously within 1 hour of incision. All
current medications are in the EMR and have been reviewed as part of
this encounter.

ANESTHESIA/SEDATION:
The procedure was performed under general anesthesia.

FLUOROSCOPY:
Radiation Exposure Index (as provided by the fluoroscopic device):
PSD [M4] mGy Kerma

COMPLICATIONS:
None immediate.

PROCEDURE:
Following a full explanation of the procedure along with the
potential associated complications, an informed witnessed consent
was obtained.

The patient was placed in prone position on the angiography table.
The lumbar spine region was prepped and draped in a sterile fashion.

Under fluoroscopy, the L1 vertebral body was delineated and the skin
area was marked. The skin was infiltrated with a 1% Lidocaine
approximately 3.5 cm lateral to the spinous process projection on
the right. Using a 22-gauge spinal needle, the soft issue and the
peripedicular space and periosteum were infiltrated with Bupivacaine
0.5%. A skin incision was made at the access site.

Subsequently, an 11-gauge Kyphon trocar was inserted under
fluoroscopic guidance until contact with the pedicle was obtained.
The trocar was inserted under light SPELL into the pedicle
until the posterior boundaries of the vertebral body was reached.
The diamond mandrill was removed and one core biopsy wasobtained.

The skin was infiltrated with a 1% Lidocaine approximately 3.5 cm
lateral to the spinous process projection on the left. Using a
22-gauge spinal needle, the soft issue and the peripedicular space
and periosteum were infiltrated with Bupivacaine 0.5%. A skin
incision was made at the access site. Subsequently, an 11-gauge
Kyphon trocar was inserted under fluoroscopic guidance until contact
with the pedicle was obtained. The trocar was inserted under light
SPELL into the pedicle until the posterior boundaries of
the vertebral body was reached. The diamond mandrill was removed.

A bone drill was coaxially advanced within the anterior third of the
vertebral body and then exchanged for inflatable Kyphon balloons.
These were centered within the mid-aspect of the vertebral body. The
balloons were inflated to create a void to serve as a repository for
the bone cement. Both balloons were deflated and through both
cannulas, under continuous fluoroscopy guidance in the AP and
lateral views, the vertebral body was filled with previously mixed
polymethyl-methacrylate (PMMA) added to barium for opacification.
Both cannulas were later removed.

Under fluoroscopy, the L2 vertebral body was delineated and the skin
area was marked. The skin was infiltrated with a 1% Lidocaine
approximately 3.7 cm lateral to the spinous process projection on
the right. Using a 22-gauge spinal needle, the soft issue and the
peripedicular space and periosteum were infiltrated with Bupivacaine
0.5%. A skin incision was made at the access site.

Subsequently, an 11-gauge Kyphon trocar was inserted under
fluoroscopic guidance until contact with the pedicle was obtained.
The trocar was inserted under light SPELL into the pedicle
until the posterior boundaries of the vertebral body was reached.
The diamond mandrill was removed and one core biopsy wasobtained.

The skin was infiltrated with a 1% Lidocaine approximately 3.7 cm
lateral to the spinous process projection on the left. Using a
22-gauge spinal needle, the soft issue and the peripedicular space
and periosteum were infiltrated with Bupivacaine 0.5%. A skin
incision was made at the access site. Subsequently, an 11-gauge
Kyphon trocar was inserted under fluoroscopic guidance until contact
with the pedicle was obtained. The trocar was inserted under light
SPELL into the pedicle until the posterior boundaries of
the vertebral body was reached. The diamond mandrill was removed.

A bone drill was coaxially advanced within the anterior third of the
vertebral body and then exchanged for inflatable Kyphon balloons.
These were centered within the mid-aspect of the vertebral body. The
balloons were inflated to create a void to serve as a repository for
the bone cement. Both balloons were deflated and through both
cannulas, under continuous fluoroscopy guidance in the AP and
lateral views, the vertebral body was filled with previously mixed
polymethyl-methacrylate (PMMA) added to barium for opacification.
Both cannulas were later removed.

The access sites were cleaned and covered with a sterile bandage.
IMPRESSION: 1. Successful fluoroscopy-guided bilateral transpedicular approach
for L1 and L2 vertebral bodies core bone biopsy and kyphoplasty for
treatment of presumed osteoporotic fragility fracture. Bone samples
obtained were sent for pathology analysis.
2. If the patient has known osteoporosis, recommend treatment as
clinically indicated. If the patient's bone density status is
unknown, DEXA scan is recommended.

## 2021-07-01 IMAGING — MR MR CERVICAL SPINE WO/W CM
10 of 24 series · 19 of 48 positions shown · IV contrast (gadavist)
Comparison: None.

CLINICAL DATA: Spine surgery/procedure, postop, infection
suspected; recent kyphoplasty with worsening back pain

EXAM:
MRI CERVICAL SPINE WITHOUT AND WITH CONTRAST
TECHNIQUE: Multiplanar and multiecho pulse sequences of the cervical spine, to
include the craniocervical junction and cervicothoracic junction,
were obtained without and with intravenous contrast.
CONTRAST:  8mL GADAVIST GADOBUTROL 1 MMOL/ML IV SOLN

[Series 2: T2 · sagittal · 3.0mm · 0.43mm/px · 2 of 16 slices shown (1 of 6)]
[im 1/16]
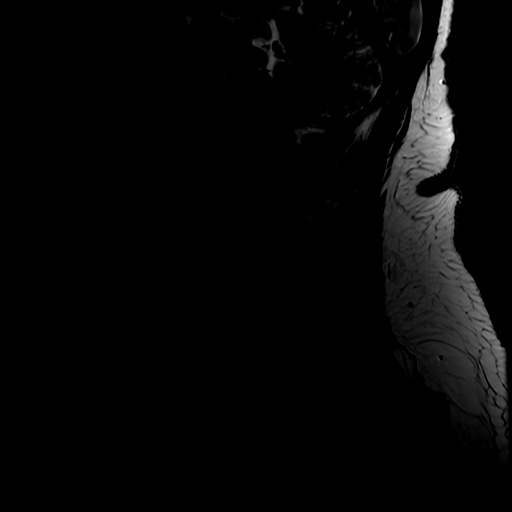
[im 16/16]
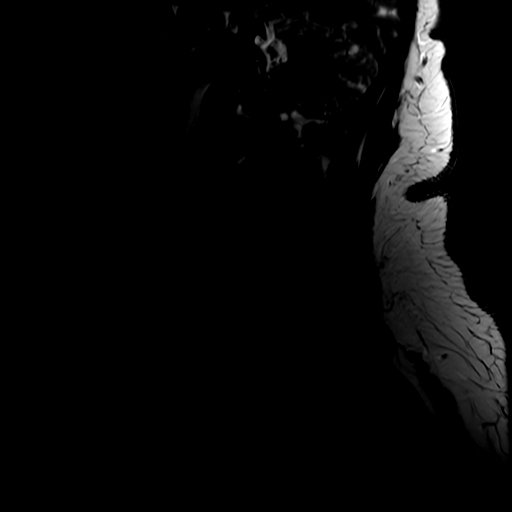

[Series 6: T2 · axial · 3.0mm · 0.35mm/px · z∈[-70,+27]mm · 3 of 29 slices shown (2 of 6)]
[im 1/29]
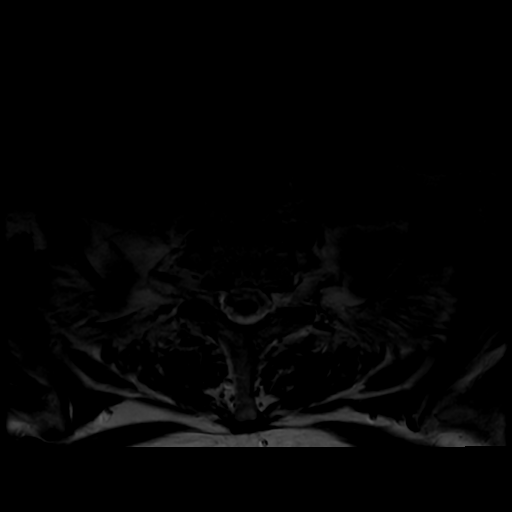
[im 15/29]
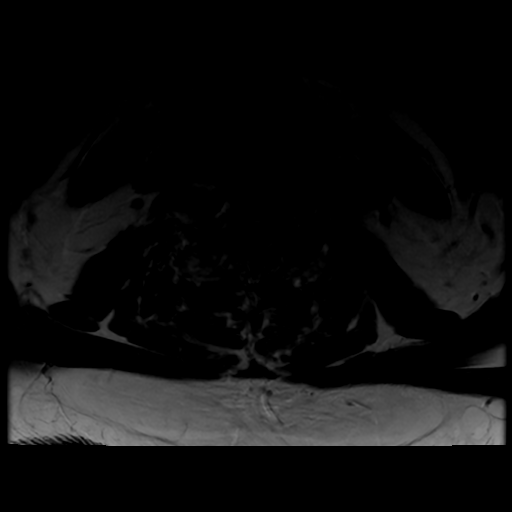
[im 29/29]
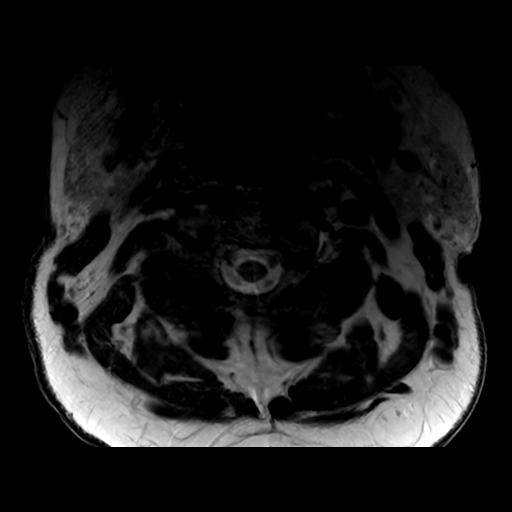

[Series 7: T1 · axial · non-contrast · 3.0mm · 0.35mm/px · z∈[-70,+27]mm · 2 of 29 slices shown (1 of 4)]
[im 1/29]
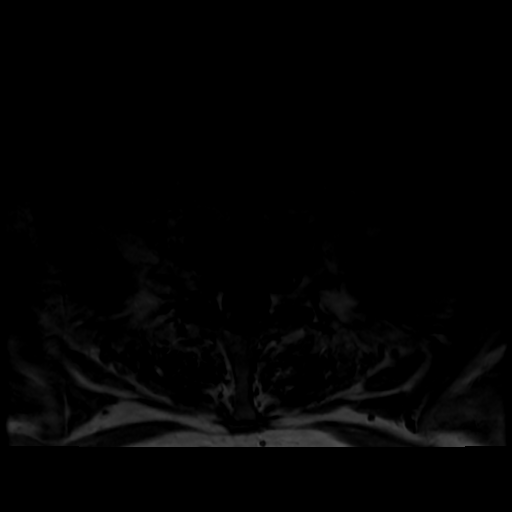
[im 29/29]
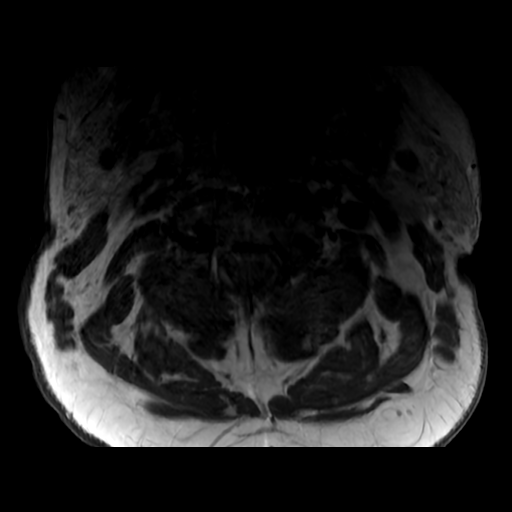

[Series 9: T1 · sagittal · 3.0mm · 0.90mm/px · 1 of 12 slices shown (2 of 4)]
[im 1/12]
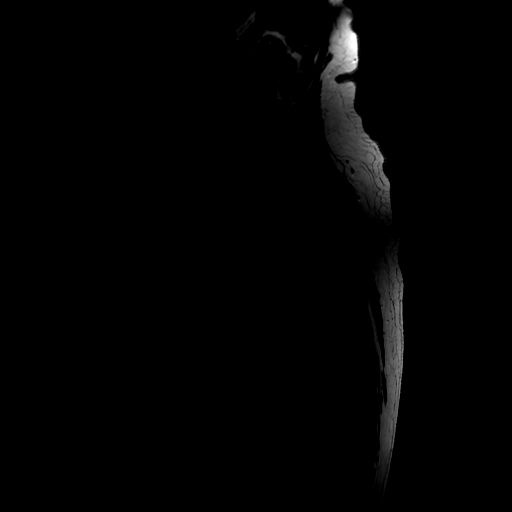

[Series 11: T2 · sagittal · 3.0mm · 0.66mm/px · 1 of 17 slices shown (3 of 6)]
[im 1/17]
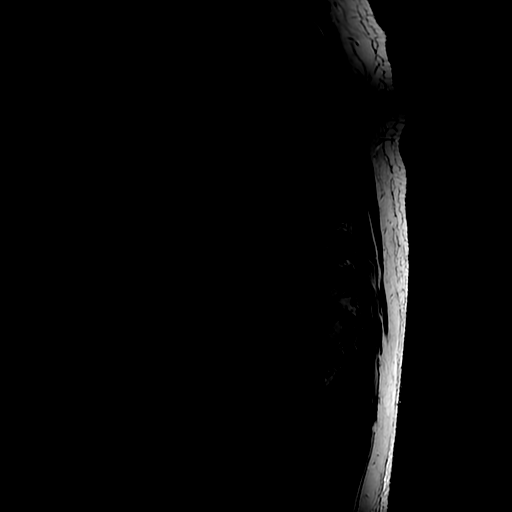

[Series 12: T1 · sagittal · 3.0mm · 0.66mm/px · 1 of 17 slices shown (3 of 4)]
[im 1/17]
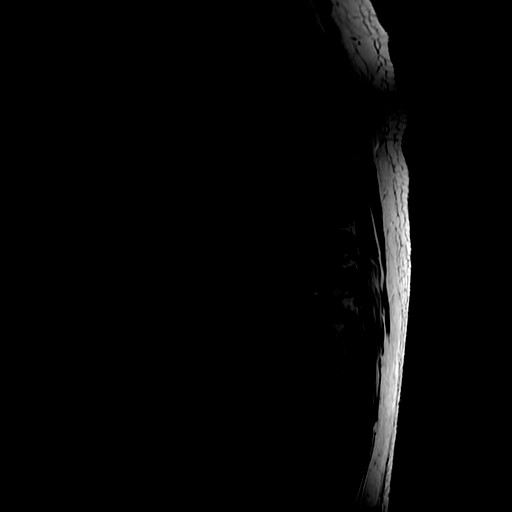

[Series 14: T2 · axial · 4.0mm · 0.39mm/px · z∈[-319,-74]mm · 3 of 47 slices shown (4 of 6)]
[im 1/47]
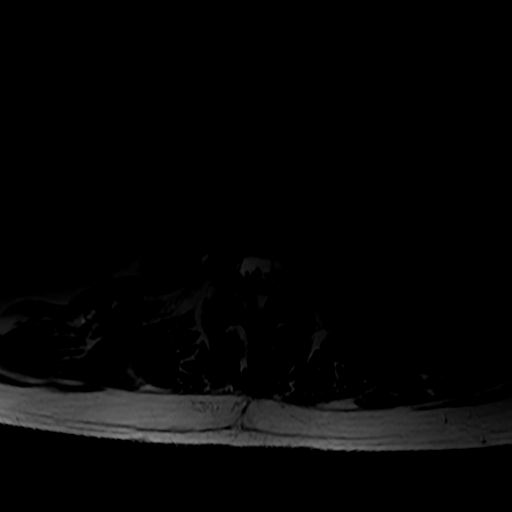
[im 24/47]
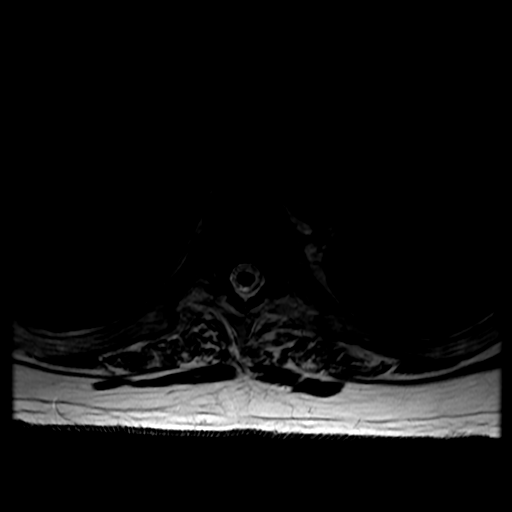
[im 47/47]
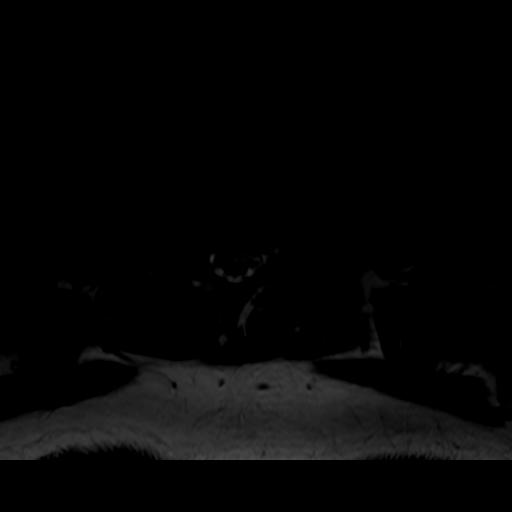

[Series 16: T1 · axial · non-contrast · 4.0mm · 0.39mm/px · z∈[-319,-74]mm · 3 of 47 slices shown (4 of 4)]
[im 1/47]
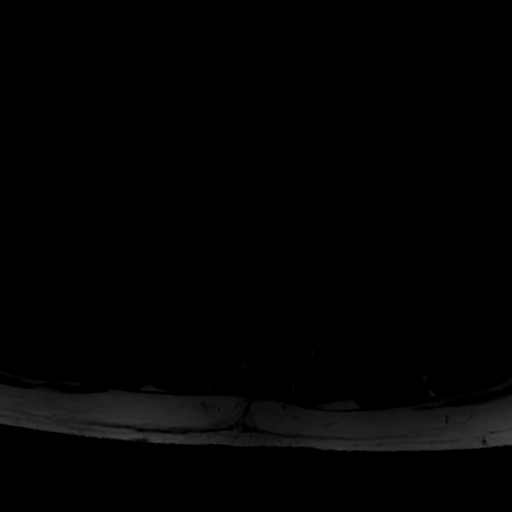
[im 24/47]
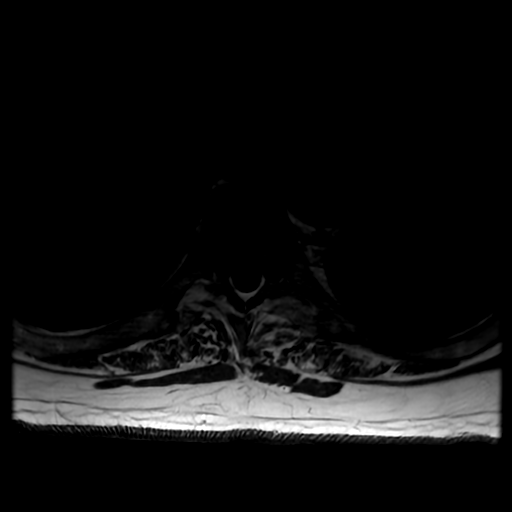
[im 47/47]
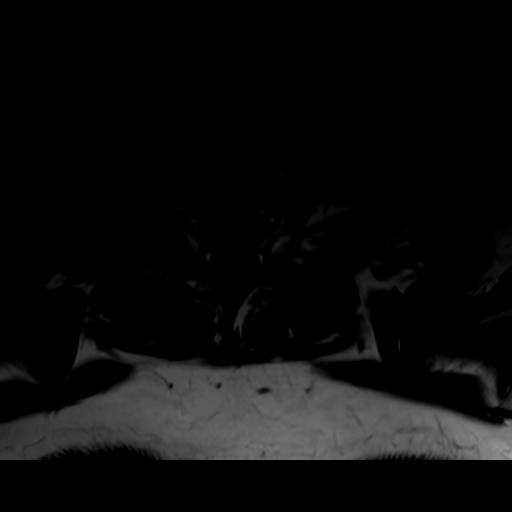

[Series 18: T2 · sagittal · 4.0mm · 0.55mm/px · 1 of 15 slices shown (5 of 6)]
[im 1/15]
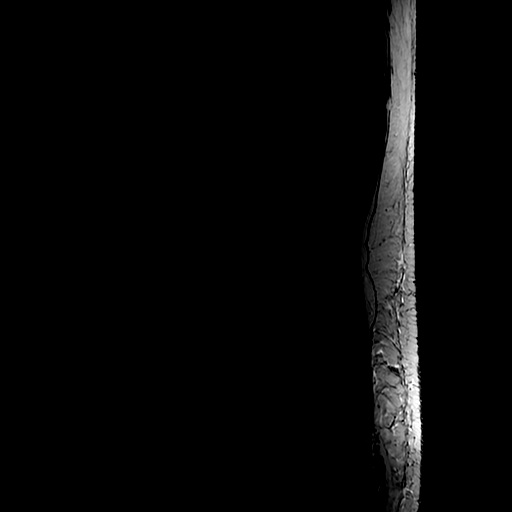

[Series 21: T2 · axial · 4.0mm · 0.39mm/px · z∈[-499,-304]mm · 2 of 33 slices shown (6 of 6)]
[im 1/33]
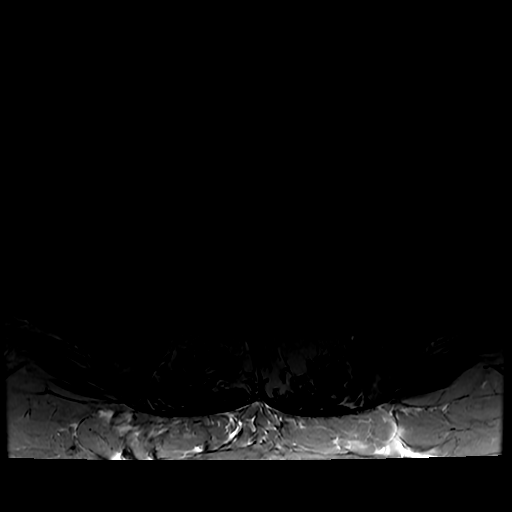
[im 33/33]
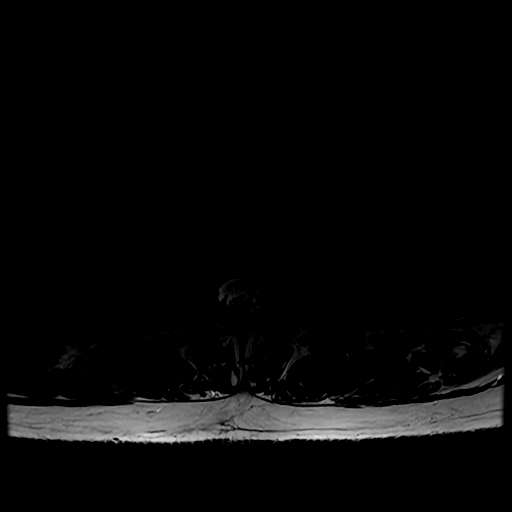

[19 of 48 positions shown; findings below may reference images not displayed]

FINDINGS: Motion artifact is present.

Alignment: Minor degenerative listhesis.

Vertebrae: Mild degenerative endplate irregularity. No marrow edema.
No suspicious osseous lesion.

Cord: No abnormal signal.

Posterior Fossa, vertebral arteries, paraspinal tissues:
Unremarkable.

Disc levels:

C2-C3:  No significant canal or foraminal stenosis.

C3-C4: Disc bulge with endplate osteophytes. Uncovertebral and facet
hypertrophy. Moderate canal stenosis. Possible mild foraminal
stenosis.

C4-C5: Disc bulge with endplate osteophytes. Uncovertebral and facet
hypertrophy. Mild canal stenosis. No definite foraminal stenosis.

C5-C6: Disc bulge with endplate osteophytes. Uncovertebral and facet
hypertrophy. Moderate to marked canal stenosis. No significant
foraminal stenosis.

C6-C7: Disc bulge with endplate osteophytes. Possible superimposed
small left paracentral disc extrusion extending just below disc
level. Uncovertebral hypertrophy. Mild canal stenosis. No
significant foraminal stenosis.

C7-T1: Facet hypertrophy. No significant canal or foraminal
stenosis.
IMPRESSION: No acute process identified.

Multilevel degenerative changes as detailed above with suboptimal
stenosis evaluation due to motion degradation.

## 2021-07-01 MED ORDER — HYDROMORPHONE HCL 1 MG/ML IJ SOLN
1.0000 mg | Freq: Once | INTRAMUSCULAR | Status: AC
  Administered 2021-07-01: 1 mg via INTRAVENOUS
  Filled 2021-07-01: qty 1

## 2021-07-01 MED ORDER — CLONAZEPAM 1 MG PO TABS: 1.0000 mg | ORAL_TABLET | ORAL | 0 refills | Status: DC | PRN

## 2021-07-01 MED ORDER — METRONIDAZOLE 500 MG/100ML IV SOLN: 500.0000 mg | Freq: Two times a day (BID) | INTRAVENOUS | Status: DC

## 2021-07-01 MED ORDER — GADOBUTROL 1 MMOL/ML IV SOLN
8.0000 mL | Freq: Once | INTRAVENOUS | Status: AC | PRN
  Administered 2021-07-01: 8 mL via INTRAVENOUS

## 2021-07-01 MED ORDER — VANCOMYCIN HCL 1750 MG/350ML IV SOLN
1750.0000 mg | Freq: Once | INTRAVENOUS | Status: DC
  Filled 2021-07-01: qty 350

## 2021-07-01 MED ORDER — SODIUM CHLORIDE 0.9 % IV SOLN: 1.0000 g | Freq: Once | INTRAVENOUS | Status: DC

## 2021-07-01 NOTE — ED Triage Notes (Signed)
Pt arrived POV from home c/o worsening back pain. Pt states he had back surgery on 4/14 and the pain has not gotten better.  ?

## 2021-07-01 NOTE — ED Notes (Signed)
Pt. Notified of the need for a urine sample; updated on the plan of care ?

## 2021-07-01 NOTE — Progress Notes (Signed)
Patient unable to tolerate MR this AM with 1mg  PO Ativan.  Called in 2 doses of 1mg  Klonopin with plans to reschedule. LVM. ? ?Loyce Dys, MS RD PA-C ? ? ?

## 2021-07-01 NOTE — ED Notes (Signed)
Patient educated on back brace maintaince and use. Patient and wife state understanding and all questions answered at this time ?

## 2021-07-01 NOTE — ED Notes (Signed)
Patient refusing repeat VS. Patient refusing to provide UA. Patient states "I just want to go home"  ?

## 2021-07-01 NOTE — ED Provider Triage Note (Signed)
Emergency Medicine Provider Triage Evaluation Note ? ?Nathan Nicholson , a 66 y.o. male  was evaluated in triage.  Pt complains of severe back pain.  He has had a recent kyphoplasty on April 14.  Patient has had significant back pain that is just getting worse since the procedure.  He called his neurosurgery doctor who did the surgery and they wanted him to come to the emergency department for consideration of MRI.  He denies any fevers, or chills.  He has no other symptoms other than severe pain. ? ?Review of Systems  ?Positive: Back pain ?Negative:  ? ?Physical Exam  ?BP (!) 151/113 (BP Location: Right Arm)   Pulse (!) 117   Temp 98.1 ?F (36.7 ?C) (Oral)   Resp 18   Ht 5\' 3"  (1.6 m)   Wt 88 kg   SpO2 94%   BMI 34.37 kg/m?  ?Gen:   Awake, no distress   ?Resp:  Normal effort  ?MSK:   Moves extremities without difficulty  ?Other:  Severe pain just to light palpation of the thoracic and lumbar spine. ? ?Medical Decision Making  ?Medically screening exam initiated at 1:51 PM.  Appropriate orders placed.  CLIFFTON SWING was informed that the remainder of the evaluation will be completed by another provider, this initial triage assessment does not replace that evaluation, and the importance of remaining in the ED until their evaluation is complete. ? ?I do not see a neurosurgery note regarding what imaging they might want.  Patient will need further evaluation in the back.  We will go ahead and get labs in case they need to get contrasted studies. ?  Adolphus Birchwood, PA-C ?07/01/21 1353 ? ?

## 2021-07-01 NOTE — ED Notes (Signed)
Patient taken to MRI at this time by MRI tech.

## 2021-07-01 NOTE — ED Notes (Signed)
This RN spoke with Nathan Nicholson in the lab that reports that he has received the blood work that was sent down (other than the 2nd lactic, c-reactive protein, and sed rate). This RN to draw these labs when patient returns from imaging  ?

## 2021-07-01 NOTE — ED Notes (Signed)
Patient still in imaging at this time.  

## 2021-07-01 NOTE — Telephone Encounter (Signed)
Pt's wife called to say that Nathan Nicholson was unable to tolerate his scheduled MRI back today even with medication due to his pain level. Pt likely has a new vertebral fracture based on his pain and history which was the reason for his scheduled scan today. After speaking with Dr. Pedro Earls she advises that the patient return to the ER for increased pain and the inability to complete his scan. They went the ER in [place] a couple of days ago with no relief or help. Wife agreeable to take pt to [facility] ER per Dr. Debbrah Alar' recommendation. JM ?

## 2021-07-01 NOTE — ED Notes (Signed)
The patient is in imaging, vitals will be completed upon arrival ?

## 2021-07-01 NOTE — Progress Notes (Signed)
Orthopedic Tech Progress Note ?Patient Details:  ?CHASETON PAULIN ?1955/06/14 ?[id number] ? ?Ortho Devices ?Type of Ortho Device: Thoracolumbar corset (TLSO) ?Ortho Device/Splint Location: Back ?Ortho Device/Splint Interventions: Ordered, Application, Adjustment ?  ?Post Interventions ?Patient Tolerated: Well ? ?Al Decant ?07/01/2021, 9:23 PM ? ?

## 2021-07-01 NOTE — ED Notes (Signed)
Pt attempted to get UA. Only 1 drop of urine in cup. Will reattempt. ?

## 2021-07-01 NOTE — Discharge Instructions (Signed)
You have been evaluated for back pain.  Your MRI did show new lumbar fracture at L1 and L2.  ? ?I have given you a neurosurgery phone number to call and schedule a follow-up appointment.  Continue to wear your brace until your follow-up appointment. ? ?Please come back to the emergency department for worsening symptoms or if your pain is uncontrolled.  In addition come back if you have new fever or if you are having problem walking or controlling your bladder. ?

## 2021-07-01 NOTE — ED Provider Notes (Signed)
?[facility] EMERGENCY DEPARTMENT ?Provider Note ? ? ?CSN: [id number] ?Arrival date & time: 07/01/21  1303 ? ?  ? ?History ? ?Chief Complaint  ?Patient presents with  ? Back Pain  ? ? ?Nathan Nicholson is a 66 y.o. male. ? ? ?Back Pain ? ?Patient is a 66 year old male with a history of COPD on 2 L baseline oxygen and recently underwent T11 and T12 kyphoplasty with IR neuro intervention (Dr. Sherlon Handing) on 4/14 who presents to the emergency department for progressively worsening mid back pain.  Patient reports his pain has significantly worsened and he is unable to ambulate.  He reports any kind of movement makes the pain worse.  He has taken oxycodone without significant improvement.  He also reported associated urinary frequency.  Denies dysuria or urinary retention.  Denies problem with sensation around his perineal region.  Denies associated fever or chills.  Denies emesis or abdominal pain.  He went to see his doctor who informed him to come to the emergency department for evaluation and possibly getting an MRI.  Otherwise no other complaints. ? ?Home Medications ?Prior to Admission medications   ?Medication Sig Start Date End Date Taking? Authorizing Provider  ?albuterol (VENTOLIN HFA) 108 (90 Base) MCG/ACT inhaler Inhale 2 puffs into the lungs every 4 (four) hours as needed (Breathing). 04/15/21   [provider]  ?aspirin 81 MG EC tablet Take 81 mg by mouth every morning.    [provider]  ?atorvastatin (LIPITOR) 10 MG tablet Take 10 mg by mouth every morning.    [provider]  ?azithromycin (ZITHROMAX) 250 MG tablet Take as directed ?Patient not taking: Reported on 06/13/2021 06/07/21   [name], MD  ?budesonide (PULMICORT) 0.5 MG/2ML nebulizer solution Take 2 mLs (0.5 mg total) by nebulization 2 (two) times daily. ?Patient taking differently: Take 0.5 mg by nebulization 3 (three) times daily. 06/07/21   [name], MD  ?clonazePAM (KLONOPIN) 1 MG  tablet Take 1 tablet (1 mg total) by mouth as needed for up to 2 doses for anxiety (Prior to MR scan). 07/01/21   Hoyt Koch, PA  ?cyanocobalamin (,VITAMIN B-12,) 1000 MCG/ML injection Inject 1,000 mcg into the muscle every 30 (thirty) days.    [provider]  ?folic acid (FOLVITE) 1 MG tablet Take 1 mg by mouth every morning.    [provider]  ?ipratropium-albuterol (DUONEB) 0.5-2.5 (3) MG/3ML SOLN Take 3 mLs by nebulization 3 (three) times daily.    [provider]  ?lisinopril-hydrochlorothiazide (ZESTORETIC) 20-12.5 MG tablet Take 1 tablet by mouth daily.    [provider]  ?LORazepam (ATIVAN) 1 MG tablet Take 1 tablet (1 mg total) by mouth as needed for anxiety. 06/30/21   Hoyt Koch, PA  ?meloxicam (MOBIC) 15 MG tablet Take 15 mg by mouth daily. 06/02/21   [provider]  ?methotrexate (RHEUMATREX) 2.5 MG tablet Take 10 mg by mouth every Tuesday. Caution:Chemotherapy. Protect from light.    [provider]  ?nicotine (NICODERM CQ - DOSED IN MG/24 HOURS) 14 mg/24hr patch Place 1 patch  onto the skin daily. ?Patient not taking: Reported on 06/13/2021 05/13/21 05/13/22  [name], DO  ?oxyCODONE (OXY IR/ROXICODONE) 5 MG immediate release tablet Take 1-2 tablets (5-10 mg total) by mouth every 6 (six) hours as needed for breakthrough pain or severe pain. 06/09/21 08/08/21  Ladene Artist, MD  ?OXYGEN Inhale 2 L into the lungs continuous.    [provider]  ?  polyethylene glycol (MIRALAX / GLYCOLAX) 17 g packet Take 17 g by mouth daily as needed for mild constipation. ?Patient not taking: Reported on 06/13/2021 05/13/21   Darlin DropHall, Carole N, DO  ?senna-docusate (SENOKOT-S) 8.6-50 MG tablet Take 1 tablet by mouth 2 (two) times daily as needed for moderate constipation. ?Patient taking differently: Take 1 tablet by mouth daily. 05/13/21   Darlin DropHall, Carole N, DO  ?Skin Protectants, Misc. (EUCERIN) cream Apply 1 application. topically  daily.    [provider]  ?Vitamin D, Ergocalciferol, (DRISDOL) 1.25 MG (50000 UNIT) CAPS capsule Take 50,000 Units by mouth every Monday.    [provider]  ?   ? ?Allergies    ?Patient has no known allergies.   ? ?Review of Systems   ?Review of Systems  ?Musculoskeletal:  Positive for back pain.  ? ?Physical Exam ?Updated Vital Signs ?BP (!) 147/92 (BP Location: Right Arm)   Pulse (!) 130   Temp 98.6 ?F (37 ?C) (Oral)   Resp (!) 22   Ht 5\' 3"  (1.6 m)   Wt 88 kg   SpO2 95%   BMI 34.37 kg/m?  ?Physical Exam ?Constitutional:   ?   Comments: Chronically ill-appearing  ?HENT:  ?   Head: Normocephalic.  ?   Nose: No congestion.  ?   Mouth/Throat:  ?   Mouth: Mucous membranes are moist.  ?   Pharynx: Oropharynx is clear.  ?Eyes:  ?   Extraocular Movements: Extraocular movements intact.  ?   Pupils: Pupils are equal, round, and reactive to light.  ?Cardiovascular:  ?   Rate and Rhythm: Tachycardia present.  ?Pulmonary:  ?   Breath sounds: Wheezing present.  ?   Comments: Bilateral wheezing ?Abdominal:  ?   Tenderness: There is no guarding or rebound.  ?Musculoskeletal:     ?   General: No deformity. Normal range of motion.  ?   Cervical back: No rigidity or tenderness.  ?Skin: ?   General: Skin is warm.  ?   Capillary Refill: Capillary refill takes less than 2 seconds.  ?Neurological:  ?   General: No focal deficit present.  ?   Mental Status: He is oriented to person, place, and time.  ?   Sensory: No sensory deficit.  ?   Comments: Extreme tenderness around the thoracic region.  There is old surgical scar looks dry clean and intact.  ? ? ?ED Results / Procedures / Treatments   ?Labs ?(all labs ordered are listed, but only abnormal results are displayed) ?Labs Reviewed  ?CBC WITH DIFFERENTIAL/PLATELET - Abnormal; Notable for the following components:  ?    Result Value  ? WBC 31.1 (*)   ? RDW 16.5 (*)   ? Platelets 428 (*)   ? Neutro Abs 19.3 (*)   ? Lymphs Abs 6.8 (*)   ? Monocytes Absolute  4.2 (*)   ? Abs Immature Granulocytes 0.38 (*)   ? All other components within normal limits  ?BASIC METABOLIC PANEL - Abnormal; Notable for the following components:  ? Potassium 3.4 (*)   ? All other components within normal limits  ?CULTURE, BLOOD (ROUTINE X 2)  ?CULTURE, BLOOD (ROUTINE X 2)  ?URINE CULTURE  ?C-REACTIVE PROTEIN  ?LACTIC ACID, PLASMA  ?LACTIC ACID, PLASMA  ?SEDIMENTATION RATE  ?URINALYSIS, ROUTINE W REFLEX MICROSCOPIC  ? ? ?EKG ?None ? ?Radiology ?MR CERVICAL SPINE W WO CONTRAST ? ?Result Date: 07/01/2021 ?CLINICAL DATA:  Spine surgery/procedure, postop, infection suspected; recent kyphoplasty with worsening back  pain EXAM: MRI CERVICAL SPINE WITHOUT AND WITH CONTRAST TECHNIQUE: Multiplanar and multiecho pulse sequences of the cervical spine, to include the craniocervical junction and cervicothoracic junction, were obtained without and with intravenous contrast. CONTRAST:  67mL GADAVIST GADOBUTROL 1 MMOL/ML IV SOLN COMPARISON:  None. FINDINGS: Motion artifact is present. Alignment: Minor degenerative listhesis. Vertebrae: Mild degenerative endplate irregularity. No marrow edema. No suspicious osseous lesion. Cord: No abnormal signal. Posterior Fossa, vertebral arteries, paraspinal tissues: Unremarkable. Disc levels: C2-C3:  No significant canal or foraminal stenosis. C3-C4: Disc bulge with endplate osteophytes. Uncovertebral and facet hypertrophy. Moderate canal stenosis. Possible mild foraminal stenosis. C4-C5: Disc bulge with endplate osteophytes. Uncovertebral and facet hypertrophy. Mild canal stenosis. No definite foraminal stenosis. C5-C6: Disc bulge with endplate osteophytes. Uncovertebral and facet hypertrophy. Moderate to marked canal stenosis. No significant foraminal stenosis. C6-C7: Disc bulge with endplate osteophytes. Possible superimposed small left paracentral disc extrusion extending just below disc level. Uncovertebral hypertrophy. Mild canal stenosis. No significant foraminal  stenosis. C7-T1: Facet hypertrophy. No significant canal or foraminal stenosis. IMPRESSION: No acute process identified. Multilevel degenerative changes as detailed above with suboptimal stenosis evaluation due to motion

## 2021-07-02 ENCOUNTER — Other Ambulatory Visit: Payer: Self-pay | Admitting: Physician Assistant

## 2021-07-02 MED ORDER — OXYCODONE HCL 5 MG PO TABS: 5.0000 mg | ORAL_TABLET | Freq: Four times a day (QID) | ORAL | 0 refills | Status: AC | PRN

## 2021-07-02 NOTE — Progress Notes (Signed)
?  Patient's wife called to report that he was seen in the ED yesterday with severe back pain. ? ?He is known to our service for previous kyphoplasty. ? ?Imaging showed NEW acute/subacute minor L1 and mild L2 compression fractures. ? ?She reported that he is in pretty bad pain. He was offered admission for pain control but declined. ? ?He is now requesting refill on Oxycodone, at least until he can see Dr. Karenann Cai ?On Tuesday. ? ?I will refill his Oxycodone. ? ?Murrell Redden PA-C ?07/02/2021 ?10:01 AM ? ? ? ? ?

## 2021-07-05 ENCOUNTER — Ambulatory Visit (HOSPITAL_COMMUNITY)
Admission: RE | Admit: 2021-07-05 | Discharge: 2021-07-05 | Disposition: A | Discharge status: Home / Self Care | Payer: Medicare HMO | Source: Ambulatory Visit | Attending: Neuroradiology | Admitting: Neuroradiology

## 2021-07-05 ENCOUNTER — Other Ambulatory Visit (HOSPITAL_COMMUNITY): Payer: Self-pay | Admitting: Neuroradiology

## 2021-07-05 DIAGNOSIS — S32010A Wedge compression fracture of first lumbar vertebra, initial encounter for closed fracture: Secondary | ICD-10-CM

## 2021-07-05 DIAGNOSIS — M546 Pain in thoracic spine: Secondary | ICD-10-CM

## 2021-07-05 DIAGNOSIS — S22070A Wedge compression fracture of T9-T10 vertebra, initial encounter for closed fracture: Secondary | ICD-10-CM | POA: Diagnosis not present

## 2021-07-05 DIAGNOSIS — S32020A Wedge compression fracture of second lumbar vertebra, initial encounter for closed fracture: Secondary | ICD-10-CM | POA: Diagnosis not present

## 2021-07-05 NOTE — Consult Note (Addendum)
? ?Chief Complaint: Patient was seen in consultation today for vertebral compression fracture. ? ?Supervising Physician: Baldemar Lenis ? ?Patient Status: [facility] - Out-pt ? ?History of Present Illness: ?Nathan Nicholson is a 66 y.o. 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  MRI thoracic spine demonstrated subacute compression fractures of T11 and T12 for which he underwent kyphoplasty on 06/17/2021. He had progression of back pain after intervention, unexpected for kyphoplasty post operative. He presented to ED on 07/01/2021 due to untreatable back pain. MRI of the cervical, thoracic and lumbar spine were performed revealing new T10, L1 and L2 compression fractures. He returns today for evaluation. He rates his pain at 6-7 when resting and 8 when moving. This has severely affected his quality of life. ? ?I personally reviewed the new images.  There are new compression fractures at T10, L1 and L2 with 20%, 26% and 45% height loss, respectively. No retropulsion or soft tissue component seen. No complication in the treated levels. ? ?Past Medical History:  ?Diagnosis Date  ? COPD (chronic obstructive pulmonary disease) (HCC)   ? COVID-19   ? 03-2020  ? Dyspnea   ? with exertion  ? HLD (hyperlipidemia)   ? Hypertension   ? Oxygen dependent   ? 2L via [place]  ? Pneumonia   ? Rheumatoid arthritis (HCC)   ? ? ?Past Surgical History:  ?Procedure Laterality Date  ? FOOT SURGERY    ? left  ? HERNIA REPAIR    ? IR KYPHO EA ADDL LEVEL THORACIC OR LUMBAR  06/17/2021  ? IR KYPHO THORACIC WITH BONE BIOPSY  06/17/2021  ? IR RADIOLOGIST EVAL & MGMT  05/18/2021  ? MULTIPLE TOOTH EXTRACTIONS    ? ALL TEETH EXT, DENTURES  ? RADIOLOGY WITH ANESTHESIA N/A 06/17/2021  ? Procedure: KYPHOPLASTY;  Surgeon: Baldemar Lenis, MD;   Location: [facility];  Service: Radiology;  Laterality: N/A;  ? SPLENECTOMY    ? TOOTH EXTRACTION    ? TOTAL HIP ARTHROPLASTY Right 08/17/2020  ? Procedure: TOTAL HIP ARTHROPLASTY ANTERIOR APPROACH;  Surgeon: Durene Romans, MD;  Location: [facility] ORS;  Service: Orthopedics;  Laterality: Right;  70 min  ? ? ?Allergies: ?Patient has no known allergies. ? ?Medications: ?Prior to Admission medications   ?Medication Sig Start Date End Date Taking? Authorizing Provider  ?albuterol (VENTOLIN HFA) 108 (90 Base) MCG/ACT inhaler Inhale 2 puffs into the lungs every 4 (four) hours as needed (Breathing). 04/15/21   [provider]  ?aspirin 81 MG EC tablet Take 81 mg by mouth every morning.    [provider]  ?atorvastatin (LIPITOR) 10 MG tablet Take 10 mg by mouth every morning.    [provider]  ?azithromycin (ZITHROMAX) 250 MG tablet Take as directed ?Patient not taking: Reported on 06/13/2021 06/07/21   [name], MD  ?budesonide (PULMICORT) 0.5 MG/2ML nebulizer solution Take 2 mLs (0.5 mg total) by nebulization 2 (two) times daily. ?Patient taking differently: Take 0.5 mg by nebulization 3 (three) times daily. 06/07/21   [name], MD  ?clonazePAM (KLONOPIN) 1 MG tablet Take 1 tablet (1 mg total) by mouth as needed for up to 2 doses for anxiety (Prior to MR scan). 07/01/21   Hoyt Koch, PA  ?cyanocobalamin (,VITAMIN B-12,) 1000 MCG/ML injection Inject 1,000 mcg into the muscle  every 30 (thirty) days.    [provider]  ?folic acid (FOLVITE) 1 MG tablet Take 1 mg by mouth every morning.    [provider]  ?ipratropium-albuterol (DUONEB) 0.5-2.5 (3) MG/3ML SOLN Take 3 mLs by nebulization 3 (three) times daily.    [provider]  ?lisinopril-hydrochlorothiazide (ZESTORETIC) 20-12.5 MG tablet Take 1 tablet by mouth daily.    [provider]  ?LORazepam (ATIVAN) 1 MG tablet Take 1 tablet (1 mg total) by mouth as needed for anxiety. 06/30/21    Docia Barrier, PA  ?meloxicam (MOBIC) 15 MG tablet Take 15 mg by mouth daily. 06/02/21   [provider]  ?methotrexate (RHEUMATREX) 2.5 MG tablet Take 10 mg by mouth every Tuesday. Caution:Chemotherapy. Protect from light.    [provider]  ?nicotine (NICODERM CQ - DOSED IN MG/24 HOURS) 14 mg/24hr patch Place 1 patch  onto the skin daily. ?Patient not taking: Reported on 06/13/2021 05/13/21 05/13/22  [name], DO  ?oxyCODONE (OXY IR/ROXICODONE) 5 MG immediate release tablet Take 1-2 tablets (5-10 mg total) by mouth every 6 (six) hours as needed for breakthrough pain or severe pain. 07/02/21 08/31/21  [name], PA-C  ?OXYGEN Inhale 2 L into the lungs continuous.    [provider]  ?polyethylene glycol (MIRALAX / GLYCOLAX) 17 g packet Take 17 g by mouth daily as needed for mild constipation. ?Patient not taking: Reported on 06/13/2021 05/13/21   [name], DO  ?senna-docusate (SENOKOT-S) 8.6-50 MG tablet Take 1 tablet by mouth 2 (two) times daily as needed for moderate constipation. ?Patient taking differently: Take 1 tablet by mouth daily. 05/13/21   [name], DO  ?Skin Protectants, Misc. (EUCERIN) cream Apply 1 application. topically daily.    [provider]  ?Vitamin D, Ergocalciferol, (DRISDOL) 1.25 MG (50000 UNIT) CAPS capsule Take 50,000 Units by mouth every Monday.    [provider]  ?  ? ?Family History  ?Problem Relation Age of Onset  ? Heart attack Brother   ? ? ?Social History  ? ?Socioeconomic History  ? Marital status: Married  ?  Spouse name: Antione Behrns  ? Number of children: 2  ? Years of education: Not on file  ? Highest education level: Not on file  ?Occupational History  ? Not on file  ?Tobacco Use  ? Smoking status: Every Day  ?  Packs/day: 0.50  ?  Years: 40.00  ?  Pack years: 20.00  ?  Types: Cigarettes  ? Smokeless tobacco: Never  ?Vaping Use  ? Vaping Use: Never used  ?Substance and Sexual Activity  ? Alcohol  use: Not Currently  ?  Comment: rare  ? Drug use: Never  ? Sexual activity: Not Currently  ?Other Topics Concern  ? Not on file  ?Social History Narrative  ? Not on file  ? ?Social Determinants of Health  ? ?Financial Resource Strain: Not on file  ?Food Insecurity: Not on file  ?Transportation Needs: Not on file  ?Physical Activity: Not on file  ?Stress: Not on file  ?Social Connections: Not on file  ? ? ? ?Review of Systems: A 12 point ROS discussed and pertinent positives are indicated in the HPI above.  All other systems are negative. ? ?Review of Systems ? ?Vital Signs: ?There were no vitals taken for this visit. ? ?Physical Exam ?Constitutional:   ?   Appearance: He is ill-appearing.  ?Musculoskeletal:  ?   Thoracic back: Tenderness and bony tenderness  present.  ?   Lumbar back: Tenderness and bony tenderness present.  ?     Back: ? ?Neurological:  ?   Mental Status: He is alert.  ? ? ? ?  ? ? ?Imaging: ?MR CERVICAL SPINE W WO CONTRAST ? ?Result Date: 07/01/2021 ?CLINICAL DATA:  Spine surgery/procedure, postop, infection suspected; recent kyphoplasty with worsening back pain EXAM: MRI CERVICAL SPINE WITHOUT AND WITH CONTRAST TECHNIQUE: Multiplanar and multiecho pulse sequences of the cervical spine, to include the craniocervical junction and cervicothoracic junction, were obtained without and with intravenous contrast. CONTRAST:  60mL GADAVIST GADOBUTROL 1 MMOL/ML IV SOLN COMPARISON:  None. FINDINGS: Motion artifact is present. Alignment: Minor degenerative listhesis. Vertebrae: Mild degenerative endplate irregularity. No marrow edema. No suspicious osseous lesion. Cord: No abnormal signal. Posterior Fossa, vertebral arteries, paraspinal tissues: Unremarkable. Disc levels: C2-C3:  No significant canal or foraminal stenosis. C3-C4: Disc bulge with endplate osteophytes. Uncovertebral and facet hypertrophy. Moderate canal stenosis. Possible mild foraminal stenosis. C4-C5: Disc bulge with endplate osteophytes.  Uncovertebral and facet hypertrophy. Mild canal stenosis. No definite foraminal stenosis. C5-C6: Disc bulge with endplate osteophytes. Uncovertebral and facet hypertrophy. Moderate to marked canal stenosis. No significan

## 2021-07-06 LAB — CULTURE, BLOOD (ROUTINE X 2)
Culture: NO GROWTH
Culture: NO GROWTH
Special Requests: ADEQUATE

## 2021-07-07 DIAGNOSIS — J9601 Acute respiratory failure with hypoxia: Secondary | ICD-10-CM | POA: Diagnosis not present

## 2021-07-07 DIAGNOSIS — J441 Chronic obstructive pulmonary disease with (acute) exacerbation: Secondary | ICD-10-CM | POA: Diagnosis not present

## 2021-07-08 ENCOUNTER — Other Ambulatory Visit (HOSPITAL_COMMUNITY): Payer: Self-pay | Admitting: Neuroradiology

## 2021-07-08 ENCOUNTER — Other Ambulatory Visit: Payer: Self-pay

## 2021-07-08 ENCOUNTER — Encounter (HOSPITAL_COMMUNITY): Payer: Self-pay | Admitting: *Deleted

## 2021-07-08 DIAGNOSIS — S32020A Wedge compression fracture of second lumbar vertebra, initial encounter for closed fracture: Secondary | ICD-10-CM

## 2021-07-08 DIAGNOSIS — S32010A Wedge compression fracture of first lumbar vertebra, initial encounter for closed fracture: Secondary | ICD-10-CM

## 2021-07-08 NOTE — Progress Notes (Signed)
Spoke with pt's wife, Olegario Messier for pre-op call, pt was in room with pt. They deny any cardiac history. Pt is not diabetic, but is treated for HTN. Pt was here less than a month ago for same surgery that did not take.  ? ?Shower instructions given wife and pt.  ?

## 2021-07-11 ENCOUNTER — Other Ambulatory Visit: Payer: Self-pay

## 2021-07-11 ENCOUNTER — Encounter (HOSPITAL_COMMUNITY): Payer: Self-pay

## 2021-07-11 ENCOUNTER — Ambulatory Visit (HOSPITAL_BASED_OUTPATIENT_CLINIC_OR_DEPARTMENT_OTHER): Payer: Medicare HMO | Admitting: General Practice

## 2021-07-11 ENCOUNTER — Ambulatory Visit (HOSPITAL_COMMUNITY): Payer: Medicare HMO | Admitting: General Practice

## 2021-07-11 ENCOUNTER — Other Ambulatory Visit: Payer: Self-pay | Admitting: Student

## 2021-07-11 ENCOUNTER — Ambulatory Visit (HOSPITAL_COMMUNITY)
Admission: RE | Admit: 2021-07-11 | Discharge: 2021-07-11 | Disposition: A | Discharge status: Home / Self Care | Payer: Medicare HMO | Attending: Neuroradiology | Admitting: Neuroradiology

## 2021-07-11 ENCOUNTER — Encounter (HOSPITAL_COMMUNITY): Payer: Self-pay | Admitting: General Practice

## 2021-07-11 ENCOUNTER — Observation Stay (HOSPITAL_COMMUNITY)
Admission: RE | Admit: 2021-07-11 | Discharge: 2021-07-11 | Disposition: A | Discharge status: Home / Self Care | Payer: Medicare HMO | Source: Ambulatory Visit | Attending: Neuroradiology | Admitting: Neuroradiology

## 2021-07-11 ENCOUNTER — Encounter (HOSPITAL_COMMUNITY)
Admission: RE | Disposition: A | Discharge status: Home / Self Care | Payer: Self-pay | Source: Home / Self Care | Attending: Neuroradiology

## 2021-07-11 DIAGNOSIS — F17210 Nicotine dependence, cigarettes, uncomplicated: Secondary | ICD-10-CM | POA: Insufficient documentation

## 2021-07-11 DIAGNOSIS — I10 Essential (primary) hypertension: Secondary | ICD-10-CM | POA: Diagnosis not present

## 2021-07-11 DIAGNOSIS — S32020A Wedge compression fracture of second lumbar vertebra, initial encounter for closed fracture: Secondary | ICD-10-CM

## 2021-07-11 DIAGNOSIS — S32010A Wedge compression fracture of first lumbar vertebra, initial encounter for closed fracture: Secondary | ICD-10-CM

## 2021-07-11 DIAGNOSIS — J449 Chronic obstructive pulmonary disease, unspecified: Secondary | ICD-10-CM | POA: Diagnosis not present

## 2021-07-11 DIAGNOSIS — M069 Rheumatoid arthritis, unspecified: Secondary | ICD-10-CM | POA: Insufficient documentation

## 2021-07-11 DIAGNOSIS — M8008XA Age-related osteoporosis with current pathological fracture, vertebra(e), initial encounter for fracture: Secondary | ICD-10-CM | POA: Diagnosis not present

## 2021-07-11 DIAGNOSIS — Z9981 Dependence on supplemental oxygen: Secondary | ICD-10-CM | POA: Diagnosis not present

## 2021-07-11 DIAGNOSIS — R69 Illness, unspecified: Secondary | ICD-10-CM | POA: Diagnosis not present

## 2021-07-11 DIAGNOSIS — M8000XA Age-related osteoporosis with current pathological fracture, unspecified site, initial encounter for fracture: Secondary | ICD-10-CM | POA: Diagnosis not present

## 2021-07-11 DIAGNOSIS — E785 Hyperlipidemia, unspecified: Secondary | ICD-10-CM | POA: Diagnosis not present

## 2021-07-11 DIAGNOSIS — M4856XA Collapsed vertebra, not elsewhere classified, lumbar region, initial encounter for fracture: Secondary | ICD-10-CM

## 2021-07-11 HISTORY — PX: IR KYPHO LUMBAR INC FX REDUCE BONE BX UNI/BIL CANNULATION INC/IMAGING: IMG5519

## 2021-07-11 HISTORY — PX: RADIOLOGY WITH ANESTHESIA: SHX6223

## 2021-07-11 LAB — CBC
HCT: 45.5 % (ref 39.0–52.0)
Hemoglobin: 14.5 g/dL (ref 13.0–17.0)
MCH: 27.8 pg (ref 26.0–34.0)
MCHC: 31.9 g/dL (ref 30.0–36.0)
MCV: 87.2 fL (ref 80.0–100.0)
Platelets: 339 10*3/uL (ref 150–400)
RBC: 5.22 MIL/uL (ref 4.22–5.81)
RDW: 17.8 % — ABNORMAL HIGH (ref 11.5–15.5)
WBC: 19.3 10*3/uL — ABNORMAL HIGH (ref 4.0–10.5)
nRBC: 0 % (ref 0.0–0.2)

## 2021-07-11 LAB — SURGICAL PATHOLOGY

## 2021-07-11 LAB — SURGICAL PCR SCREEN
MRSA, PCR: NEGATIVE
Staphylococcus aureus: POSITIVE — AB

## 2021-07-11 LAB — PROTIME-INR
INR: 0.9 (ref 0.8–1.2)
Prothrombin Time: 12.3 seconds (ref 11.4–15.2)

## 2021-07-11 IMAGING — XA IR KYPHO VERTEBRAL LUMBAR AUGMENTATION
9 of 10 series · 12 of 24 positions shown · non-contrast
Comparison: MRI of the cervical, thoracic and lumbar spine SPELL

INDICATION: SPELL is a 66-year-old male with past medical history
significant for COPD (intermittent home oxygen use), smoking
history, rheumatoid arthritis with Felty syndrome (s/p splenectomy
[M4],) and recent T 11 and T 12 compression fx, status post
kyphoplasty and bone biopsy under general anesthesia on [DATE]. Of
note, pathology revealed no evidence of malignancy. Patient
tolerated T11 and T12 SPELL well, however, recovery was complicated by
persistent severe back pain. MRI of spine was ordered for further
evaluation; however, patient was not able to tolerate MRI as
outpatient due severe back pain. Patient was advised to go to ED for
pain management and MRI with higher level of pain control/sedation,
and underwent MRI of C,T,L spine on [DATE] which revealed new T10, L1
and L2 compression fractures. Patient was evaluated on [DATE] and
was found to head severe back pain limiting activities of daily
life. He has moderately severe pain in rest and severe pain with any
movement. After peer to peer review, insurance has approved
vertebral augmentation at the L1 and L2 levels.

EXAM:
FLUOROSCOPY GUIDED L1 AND L2 CORE BONE BIOPSY AND BALLOON
KYPHOPLASTY

[Series 3: fl neuro n · 2 acquisitions, 1 frame shown (1 of 8)]
[im 1/2]
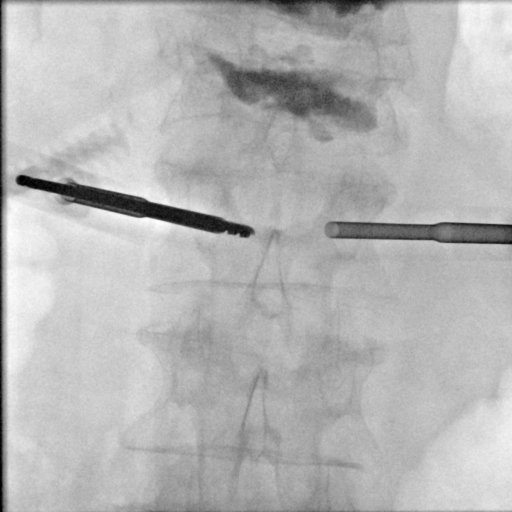

[Series 4: fl neuro n · 2 acquisitions, 1 frame shown (2 of 8)]
[im 1/2]
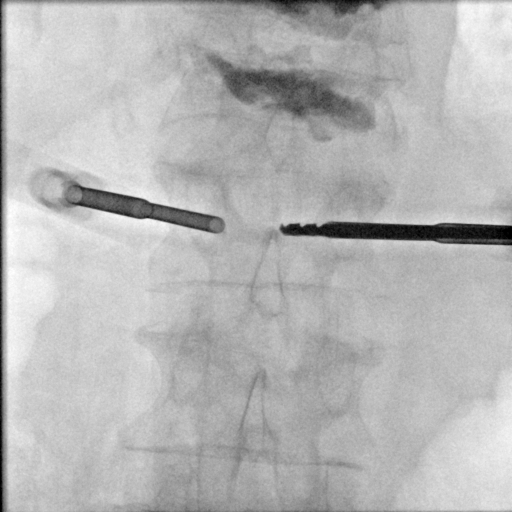

[Series 5: fl neuro n · 2 acquisitions, 2 frames shown (3 of 8)]
[im 1/2]
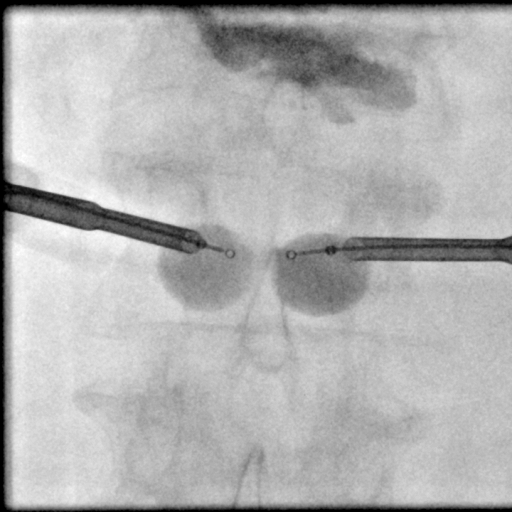
[im 2/2]
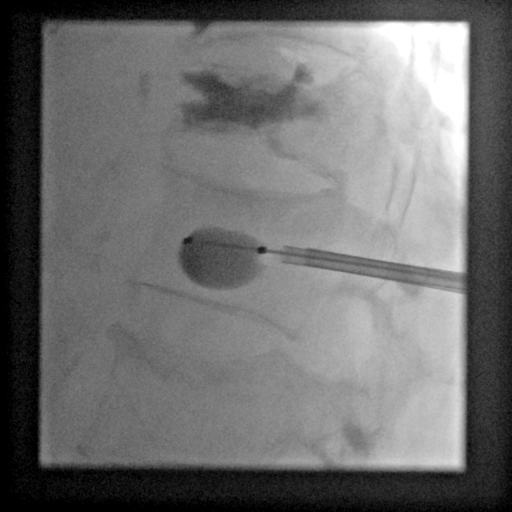

[Series 7: fl neuro n · 2 acquisitions, 1 frame shown (4 of 8)]
[im 1/2]
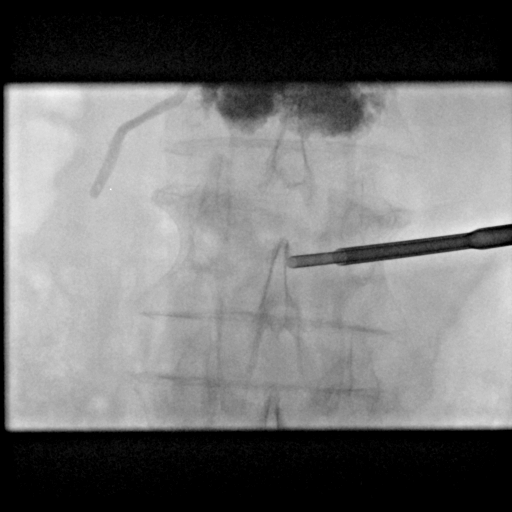

[Series 8: fl neuro n · 2 acquisitions, 2 frames shown (5 of 8)]
[im 1/2]
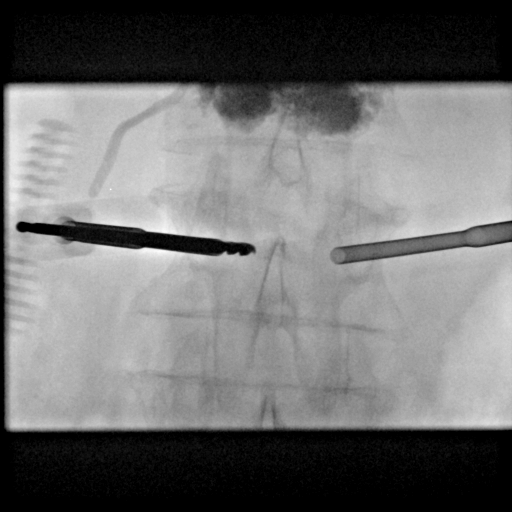
[im 2/2]
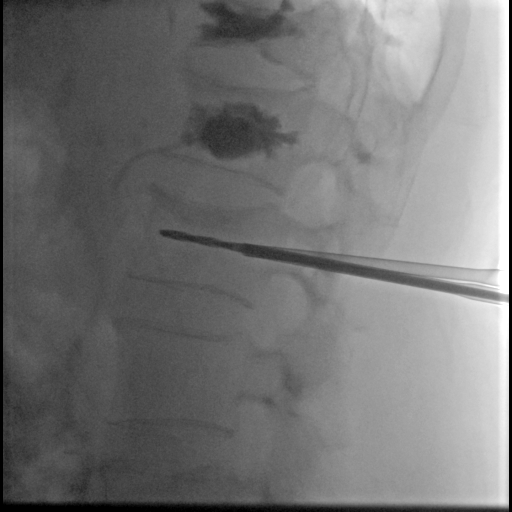

[Series 9: fl neuro n · 2 acquisitions, 1 frame shown (6 of 8)]
[im 2/2]
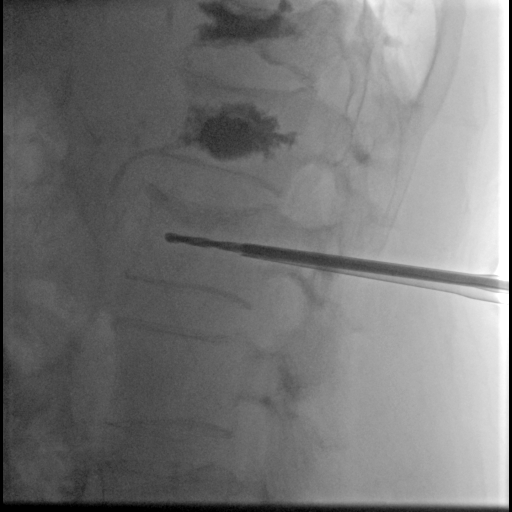

[Series 10: fl neuro n · 2 acquisitions, 1 frame shown (7 of 8)]
[im 1/2]
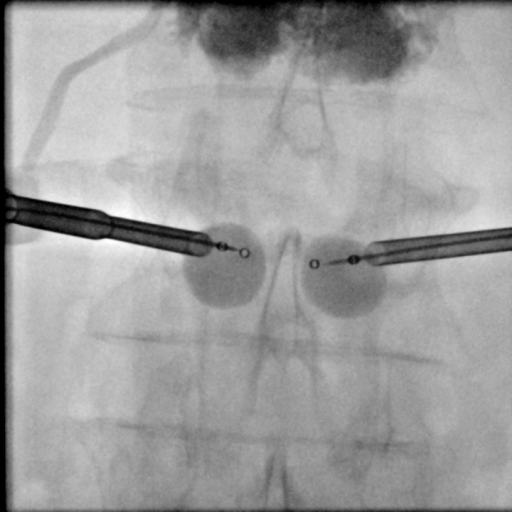

[Series 11: fl neuro n · 2 acquisitions, 2 frames shown (8 of 8)]
[im 1/2]
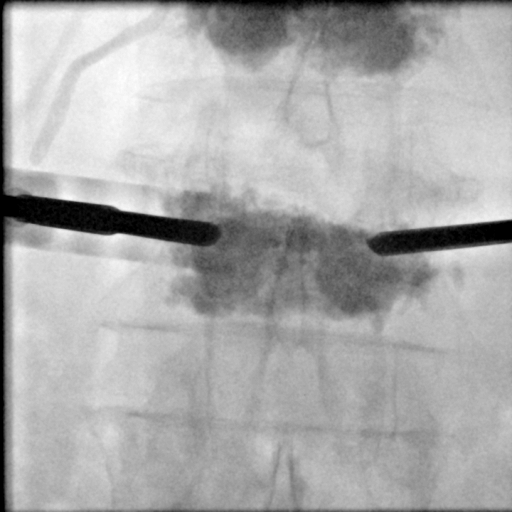
[im 2/2]
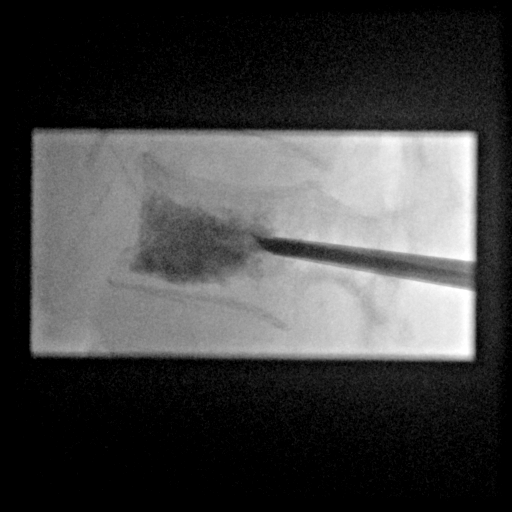

[Series 300: ir kypho lumbar inc fx reduce bone bx un · 1 of 2 slices shown]
[im 2/2]
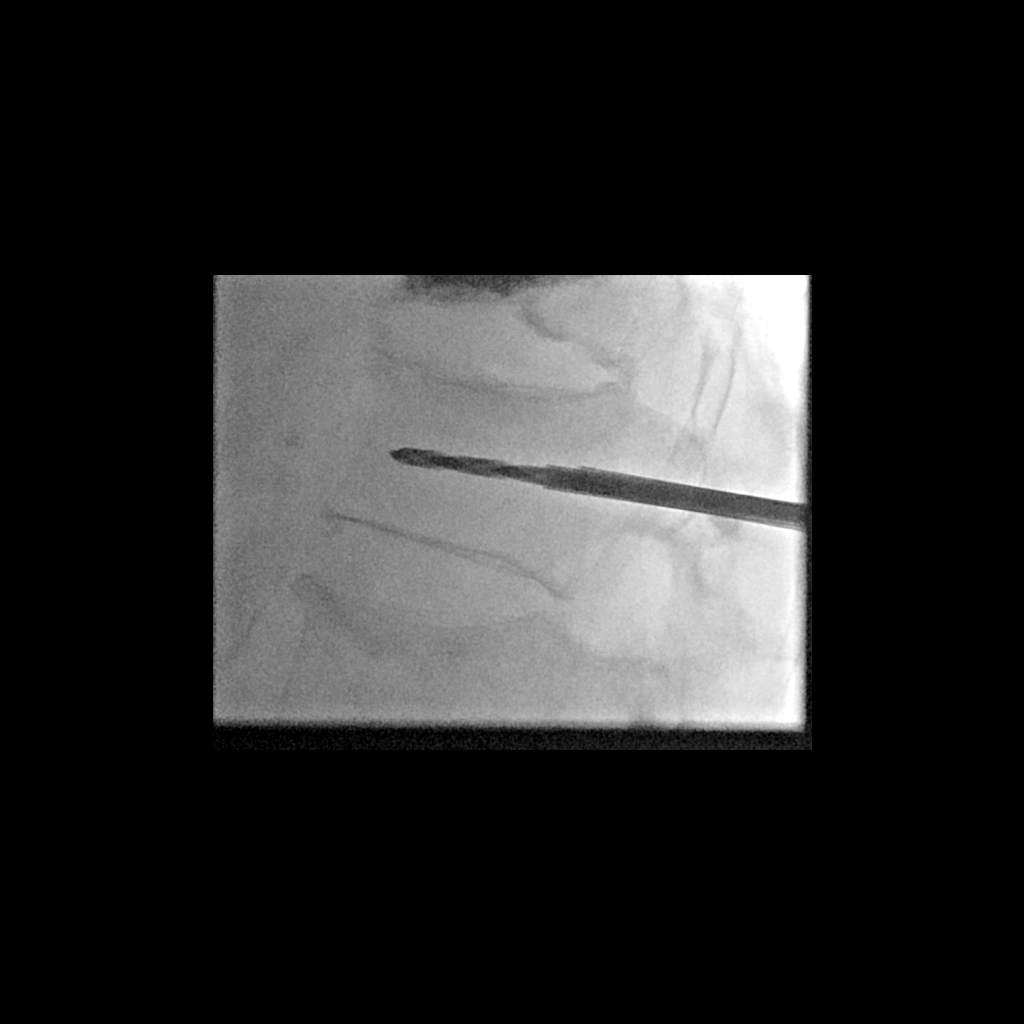

[12 of 24 positions shown; findings below may reference images not displayed]

MEDICATIONS:
As antibiotic prophylaxis, Ancef 2 g IV was ordered pre-procedure
and administered intravenously within 1 hour of incision. All
current medications are in the EMR and have been reviewed as part of
this encounter.

ANESTHESIA/SEDATION:
The procedure was performed under general anesthesia.

FLUOROSCOPY:
Radiation Exposure Index (as provided by the fluoroscopic device):
PSD [M4] mGy Kerma

COMPLICATIONS:
None immediate.

PROCEDURE:
Following a full explanation of the procedure along with the
potential associated complications, an informed witnessed consent
was obtained.

The patient was placed in prone position on the angiography table.
The lumbar spine region was prepped and draped in a sterile fashion.

Under fluoroscopy, the L1 vertebral body was delineated and the skin
area was marked. The skin was infiltrated with a 1% Lidocaine
approximately 3.5 cm lateral to the spinous process projection on
the right. Using a 22-gauge spinal needle, the soft issue and the
peripedicular space and periosteum were infiltrated with Bupivacaine
0.5%. A skin incision was made at the access site.

Subsequently, an 11-gauge Kyphon trocar was inserted under
fluoroscopic guidance until contact with the pedicle was obtained.
The trocar was inserted under light SPELL into the pedicle
until the posterior boundaries of the vertebral body was reached.
The diamond mandrill was removed and one core biopsy wasobtained.

The skin was infiltrated with a 1% Lidocaine approximately 3.5 cm
lateral to the spinous process projection on the left. Using a
22-gauge spinal needle, the soft issue and the peripedicular space
and periosteum were infiltrated with Bupivacaine 0.5%. A skin
incision was made at the access site. Subsequently, an 11-gauge
Kyphon trocar was inserted under fluoroscopic guidance until contact
with the pedicle was obtained. The trocar was inserted under light
SPELL into the pedicle until the posterior boundaries of
the vertebral body was reached. The diamond mandrill was removed.

A bone drill was coaxially advanced within the anterior third of the
vertebral body and then exchanged for inflatable Kyphon balloons.
These were centered within the mid-aspect of the vertebral body. The
balloons were inflated to create a void to serve as a repository for
the bone cement. Both balloons were deflated and through both
cannulas, under continuous fluoroscopy guidance in the AP and
lateral views, the vertebral body was filled with previously mixed
polymethyl-methacrylate (PMMA) added to barium for opacification.
Both cannulas were later removed.

Under fluoroscopy, the L2 vertebral body was delineated and the skin
area was marked. The skin was infiltrated with a 1% Lidocaine
approximately 3.7 cm lateral to the spinous process projection on
the right. Using a 22-gauge spinal needle, the soft issue and the
peripedicular space and periosteum were infiltrated with Bupivacaine
0.5%. A skin incision was made at the access site.

Subsequently, an 11-gauge Kyphon trocar was inserted under
fluoroscopic guidance until contact with the pedicle was obtained.
The trocar was inserted under light SPELL into the pedicle
until the posterior boundaries of the vertebral body was reached.
The diamond mandrill was removed and one core biopsy wasobtained.

The skin was infiltrated with a 1% Lidocaine approximately 3.7 cm
lateral to the spinous process projection on the left. Using a
22-gauge spinal needle, the soft issue and the peripedicular space
and periosteum were infiltrated with Bupivacaine 0.5%. A skin
incision was made at the access site. Subsequently, an 11-gauge
Kyphon trocar was inserted under fluoroscopic guidance until contact
with the pedicle was obtained. The trocar was inserted under light
SPELL into the pedicle until the posterior boundaries of
the vertebral body was reached. The diamond mandrill was removed.

A bone drill was coaxially advanced within the anterior third of the
vertebral body and then exchanged for inflatable Kyphon balloons.
These were centered within the mid-aspect of the vertebral body. The
balloons were inflated to create a void to serve as a repository for
the bone cement. Both balloons were deflated and through both
cannulas, under continuous fluoroscopy guidance in the AP and
lateral views, the vertebral body was filled with previously mixed
polymethyl-methacrylate (PMMA) added to barium for opacification.
Both cannulas were later removed.

The access sites were cleaned and covered with a sterile bandage.
IMPRESSION: 1. Successful fluoroscopy-guided bilateral transpedicular approach
for L1 and L2 vertebral bodies core bone biopsy and kyphoplasty for
treatment of presumed osteoporotic fragility fracture. Bone samples
obtained were sent for pathology analysis.
2. If the patient has known osteoporosis, recommend treatment as
clinically indicated. If the patient's bone density status is
unknown, DEXA scan is recommended.

## 2021-07-11 SURGERY — IR WITH ANESTHESIA
Anesthesia: General

## 2021-07-11 MED ORDER — CHLORHEXIDINE GLUCONATE 0.12 % MT SOLN
15.0000 mL | Freq: Once | OROMUCOSAL | Status: AC
  Administered 2021-07-11: 15 mL via OROMUCOSAL
  Filled 2021-07-11: qty 15

## 2021-07-11 MED ORDER — ROCURONIUM BROMIDE 10 MG/ML (PF) SYRINGE
PREFILLED_SYRINGE | INTRAVENOUS | Status: DC | PRN
  Administered 2021-07-11: 60 mg via INTRAVENOUS
  Administered 2021-07-11: 40 mg via INTRAVENOUS

## 2021-07-11 MED ORDER — ORAL CARE MOUTH RINSE: 15.0000 mL | Freq: Once | OROMUCOSAL | Status: AC

## 2021-07-11 MED ORDER — LACTATED RINGERS IV SOLN: INTRAVENOUS | Status: DC

## 2021-07-11 MED ORDER — DEXAMETHASONE SODIUM PHOSPHATE 10 MG/ML IJ SOLN
INTRAMUSCULAR | Status: DC | PRN
  Administered 2021-07-11: 8 mg via INTRAVENOUS

## 2021-07-11 MED ORDER — ALBUTEROL SULFATE (2.5 MG/3ML) 0.083% IN NEBU
2.5000 mg | INHALATION_SOLUTION | Freq: Four times a day (QID) | RESPIRATORY_TRACT | Status: DC | PRN
  Administered 2021-07-11: 2.5 mg via RESPIRATORY_TRACT

## 2021-07-11 MED ORDER — CHLORHEXIDINE GLUCONATE CLOTH 2 % EX PADS: 6.0000 | MEDICATED_PAD | Freq: Every day | CUTANEOUS | Status: DC

## 2021-07-11 MED ORDER — IPRATROPIUM-ALBUTEROL 0.5-2.5 (3) MG/3ML IN SOLN
3.0000 mL | Freq: Once | RESPIRATORY_TRACT | Status: AC
  Administered 2021-07-11: 3 mL via RESPIRATORY_TRACT
  Filled 2021-07-11: qty 3

## 2021-07-11 MED ORDER — PHENYLEPHRINE HCL-NACL 20-0.9 MG/250ML-% IV SOLN
INTRAVENOUS | Status: DC | PRN
  Administered 2021-07-11: 40 ug/min via INTRAVENOUS

## 2021-07-11 MED ORDER — FENTANYL CITRATE (PF) 100 MCG/2ML IJ SOLN
INTRAMUSCULAR | Status: DC | PRN
Start: 2021-07-11 — End: 2021-07-11
  Administered 2021-07-11: 25 ug via INTRAVENOUS
  Administered 2021-07-11: 100 ug via INTRAVENOUS

## 2021-07-11 MED ORDER — BUPIVACAINE HCL (PF) 0.5 % IJ SOLN
INTRAMUSCULAR | Status: AC
  Filled 2021-07-11: qty 30

## 2021-07-11 MED ORDER — SODIUM CHLORIDE 0.9 % IV SOLN: INTRAVENOUS | Status: DC

## 2021-07-11 MED ORDER — ONDANSETRON HCL 4 MG/2ML IJ SOLN
INTRAMUSCULAR | Status: DC | PRN
  Administered 2021-07-11: 4 mg via INTRAVENOUS

## 2021-07-11 MED ORDER — PROPOFOL 10 MG/ML IV BOLUS
INTRAVENOUS | Status: DC | PRN
  Administered 2021-07-11: 100 mg via INTRAVENOUS

## 2021-07-11 MED ORDER — CEFAZOLIN SODIUM-DEXTROSE 2-4 GM/100ML-% IV SOLN
2.0000 g | INTRAVENOUS | Status: AC
  Administered 2021-07-11: 2 g via INTRAVENOUS

## 2021-07-11 MED ORDER — ACETAMINOPHEN 10 MG/ML IV SOLN
INTRAVENOUS | Status: AC
  Filled 2021-07-11: qty 100

## 2021-07-11 MED ORDER — FENTANYL CITRATE (PF) 100 MCG/2ML IJ SOLN: 25.0000 ug | INTRAMUSCULAR | Status: DC | PRN

## 2021-07-11 MED ORDER — LIDOCAINE 2% (20 MG/ML) 5 ML SYRINGE
INTRAMUSCULAR | Status: DC | PRN
  Administered 2021-07-11: 60 mg via INTRAVENOUS

## 2021-07-11 MED ORDER — CEFAZOLIN SODIUM-DEXTROSE 2-4 GM/100ML-% IV SOLN
INTRAVENOUS | Status: AC
  Filled 2021-07-11: qty 100

## 2021-07-11 MED ORDER — PHENYLEPHRINE 80 MCG/ML (10ML) SYRINGE FOR IV PUSH (FOR BLOOD PRESSURE SUPPORT)
PREFILLED_SYRINGE | INTRAVENOUS | Status: DC | PRN
  Administered 2021-07-11 (×2): 80 ug via INTRAVENOUS
  Administered 2021-07-11: 160 ug via INTRAVENOUS
  Administered 2021-07-11 (×2): 80 ug via INTRAVENOUS

## 2021-07-11 MED ORDER — ALBUTEROL SULFATE (2.5 MG/3ML) 0.083% IN NEBU
INHALATION_SOLUTION | RESPIRATORY_TRACT | Status: AC
  Filled 2021-07-11: qty 3

## 2021-07-11 MED ORDER — ONDANSETRON HCL 4 MG/2ML IJ SOLN: 4.0000 mg | Freq: Once | INTRAMUSCULAR | Status: DC | PRN

## 2021-07-11 MED ORDER — MUPIROCIN 2 % EX OINT: 1.0000 "application " | TOPICAL_OINTMENT | Freq: Two times a day (BID) | CUTANEOUS | Status: DC

## 2021-07-11 MED ORDER — SUGAMMADEX SODIUM 200 MG/2ML IV SOLN
INTRAVENOUS | Status: DC | PRN
Start: 2021-07-11 — End: 2021-07-11
  Administered 2021-07-11: 300 mg via INTRAVENOUS

## 2021-07-11 MED ORDER — ACETAMINOPHEN 10 MG/ML IV SOLN
INTRAVENOUS | Status: DC | PRN
  Administered 2021-07-11: 1000 mg via INTRAVENOUS

## 2021-07-11 MED ORDER — LIDOCAINE HCL (PF) 1 % IJ SOLN
INTRAMUSCULAR | Status: AC
  Filled 2021-07-11: qty 30

## 2021-07-11 NOTE — H&P (Deleted)
  The note originally documented on this encounter has been moved the the encounter in which it belongs.  

## 2021-07-11 NOTE — Discharge Summary (Signed)
? ? ?Patient ID: ?Nathan Nicholson ?MRN: [id number] ?DOB/AGE: 1955/04/26 66 y.o. ? ?Admit date: 07/11/2021 ?Discharge date: 07/11/2021 ? ?Supervising Physician: Baldemar Lenis ? ?Patient Status: [facility] - In-pt ? ?Admission Diagnoses: Compression fracture of L1 and L2  ? ?Discharge Diagnoses:  ?Active Problems: ?  * No active hospital problems. * ? ? ?Discharged Condition: good ? ?Hospital Course:  ? ?Patient presented to [facility] for an image-guided KP of L1 and L2  under general anesthesia by Dr. Tommie Sams today.  ?Procedure occurred without major complications and patient was transferred to PACU in stable condition (VSS, lower back puncture sites stable) for overnight observation. No major events occurred overnight. ?  ?Patient awake and alert  sitting in recliner. Wife at bedside.  ?Reports mild soreness in his back. ?Lower back puncture site stable. Plan to discharge home today and follow-up with Dr. Tommie Sams for in person visit in 1 week from today.  ? ?Patient was encourage to follow up with his PCP for evaluation and management of osteoporosis to prevent further compression fracture.  ?He verbalized understanding.  ? ?OK to discharge when bedrest time is up.  ? ?Consults: None ? ?Significant Diagnostic Studies: none  ? ?Treatments: Kyphoplasty of L1 and L2 compression fracture  ? ?Discharge Exam: ?Blood pressure (!) 96/59, pulse 75, temperature 97.9 ?F (36.6 ?C), resp. rate 15, height  (1.6 m), weight 194 lb (88 kg), SpO2 92 %. ? ?Physical Exam ?Vitals and nursing note reviewed.  ?Constitutional:   ?   General: Patient is not in acute distress. ?   Appearance: Normal appearance. Patient is not ill-appearing.  ?HENT:  ?   Head: Normocephalic and atraumatic.  ?   Mouth/Throat:  ?   Mouth: Mucous membranes are moist.  ?   Pharynx: Oropharynx is clear.  ?Cardiovascular:  ?   Rate and Rhythm: Normal rate and regular rhythm.  ?   Pulses: Normal pulses.  ?   Heart sounds: Normal heart  sounds.  ?Pulmonary:  ?   Effort: Pulmonary effort is normal.  ?   Breath sounds: Decreased BS all lobes.  ?Abdominal:  ?   General: Abdomen is flat. Bowel sounds are normal.  ?   Palpations: Abdomen is soft.  ?Musculoskeletal:  ?   Cervical back: Neck supple.  ?Skin: ?   General: Skin is warm and dry.  ?   Coloration: Skin is not jaundiced or pale.  ?+ 2 Dressing on lower back. Dressing is clean, dry, intact, no blood seen. Puncture site mildly TTP ?Neurological:  ?   Mental Status: Patient is alert and oriented to person, place, and time.  ?Psychiatric:     ?   Mood and Affect: Mood normal.     ?   Behavior: Behavior normal.     ?   Judgment: Judgment normal.  ? ? ? ?Disposition: Discharge disposition: 01-Home or Self Care ? ? ? ? ? ? ?Discharge Instructions   ? ? Call MD for:   Complete by: As directed ?  ? Call MD for:  difficulty breathing, headache or visual disturbances   Complete by: As directed ?  ? Call MD for:  extreme fatigue   Complete by: As directed ?  ? Call MD for:  hives   Complete by: As directed ?  ? Call MD for:  persistant dizziness or light-headedness   Complete by: As directed ?  ? Call MD for:  persistant nausea and vomiting  Complete by: As directed ?  ? Call MD for:  redness, tenderness, or signs of infection (pain, swelling, redness, odor or green/yellow discharge around incision site)   Complete by: As directed ?  ? Call MD for:  severe uncontrolled pain   Complete by: As directed ?  ? Call MD for:  temperature >100.4   Complete by: As directed ?  ? Diet - low sodium heart healthy   Complete by: As directed ?  ? Discharge wound care:   Complete by: As directed ?  ? Keep the dressing on lower back until tomorrow 4 pm. May take shower after 4 pm tomorrow. Keep the dressing on while taking shower, remove the wet dressing immediately after taking shower and pat area dry completely. May place a regular Band-aid until fully heals. DO NOT submerge (sitting in a tub, swimming,) for 7 days.  ?  Increase activity slowly   Complete by: As directed ?  ? Lifting restrictions   Complete by: As directed ?  ? No lifting, bending, stooping, for 2 weeks. Minimize rotation of lower back as much as possible.  ? ?  ? ?Allergies as of 07/11/2021   ?No Known Allergies ?  ? ?  ?Medication List  ?  ? ?TAKE these medications   ? ?acetaminophen 500 MG tablet ?Commonly known as: TYLENOL ?Take 500 mg by mouth every 6 (six) hours as needed. ?  ?albuterol 108 (90 Base) MCG/ACT inhaler ?Commonly known as: VENTOLIN HFA ?Inhale 2 puffs into the lungs every 4 (four) hours as needed (Breathing). ?  ?aspirin 81 MG EC tablet ?Take 81 mg by mouth every morning. ?  ?atorvastatin 10 MG tablet ?Commonly known as: LIPITOR ?Take 10 mg by mouth every morning. ?  ?azithromycin 250 MG tablet ?Commonly known as: Zithromax ?Take as directed ?  ?budesonide 0.5 MG/2ML nebulizer solution ?Commonly known as: Pulmicort ?Take 2 mLs (0.5 mg total) by nebulization 2 (two) times daily. ?What changed: when to take this ?  ?clonazePAM 1 MG tablet ?Commonly known as: KlonoPIN ?Take 1 tablet (1 mg total) by mouth as needed for up to 2 doses for anxiety (Prior to MR scan). ?  ?cyanocobalamin 1000 MCG/ML injection ?Commonly known as: (VITAMIN B-12) ?Inject 1,000 mcg into the muscle every 30 (thirty) days. ?  ?eucerin cream ?Apply 1 application. topically daily. ?  ?folic acid 1 MG tablet ?Commonly known as: FOLVITE ?Take 1 mg by mouth every morning. ?  ?ibuprofen 200 MG tablet ?Commonly known as: ADVIL ?Take 400 mg by mouth every 8 (eight) hours as needed (pain.). ?  ?ipratropium-albuterol 0.5-2.5 (3) MG/3ML Soln ?Commonly known as: DUONEB ?Take 3 mLs by nebulization 3 (three) times daily. ?  ?lisinopril-hydrochlorothiazide 20-12.5 MG tablet ?Commonly known as: ZESTORETIC ?Take 1 tablet by mouth daily. ?  ?LORazepam 1 MG tablet ?Commonly known as: Ativan ?Take 1 tablet (1 mg total) by mouth as needed for anxiety. ?  ?methotrexate 2.5 MG tablet ?Commonly known  as: RHEUMATREX ?Take 10 mg by mouth every Tuesday. Caution:Chemotherapy. Protect from light. ?  ?nicotine 14 mg/24hr patch ?Commonly known as: NICODERM CQ - dosed in mg/24 hours ?Place 1 patch  onto the skin daily. ?  ?oxyCODONE 5 MG immediate release tablet ?Commonly known as: Oxy IR/ROXICODONE ?Take 1-2 tablets (5-10 mg total) by mouth every 6 (six) hours as needed for breakthrough pain or severe pain. ?  ?OXYGEN ?Inhale 2 L into the lungs continuous. ?  ?polyethylene glycol 17 g packet ?Commonly known as: MIRALAX /  GLYCOLAX ?Take 17 g by mouth daily as needed for mild constipation. ?  ?senna-docusate 8.6-50 MG tablet ?Commonly known as: Senokot-S ?Take 1 tablet by mouth 2 (two) times daily as needed for moderate constipation. ?What changed: when to take this ?  ?Vitamin D (Ergocalciferol) 1.25 MG (50000 UNIT) Caps capsule ?Commonly known as: DRISDOL ?Take 50,000 Units by mouth every Monday. ?  ? ?  ? ?  ?  ? ? ?  ?Discharge Care Instructions  ?(From admission, onward)  ?  ? ? ?  ? ?  Start     Ordered  ? 07/11/21 0000  Discharge wound care:       ?Comments: Keep the dressing on lower back until tomorrow 4 pm. May take shower after 4 pm tomorrow. Keep the dressing on while taking shower, remove the wet dressing immediately after taking shower and pat area dry completely. May place a regular Band-aid until fully heals. DO NOT submerge (sitting in a tub, swimming,) for 7 days.  ? 07/11/21 1602  ? ?  ?  ? ?  ? ? Follow-up Information   ? ? de Glori Luis, MD Follow up.   ?Specialties: Radiology, Interventional Radiology ?Why: follow up visit  in 1 week. Our scheduler will call you to set up the appintment. ?Contact information: ?[address] ?[place] [address] ?[phone] ? ? ?  ?  ? ?  ?  ? ?  ?  ? ?Electronically Signed: ?Willette Brace, PA-C ?07/11/2021, 4:03 PM ? ? ?I have spent Less Than 30 Minutes discharging Nathan Nicholson. ? ? ? ? ?

## 2021-07-11 NOTE — H&P (Addendum)
? ?Chief Complaint: ?Patient was seen in consultation today for kyphoplasty at the request of de Mont Dutton Rodrigues,Katyucia ? ?Referring Physician(s): de [place] Dutton Rodrigues,Katyucia ? ?Supervising Physician: Pedro Earls ? ?Patient Status: Nathan Nicholson - Out-pt ? ?History of Present Illness: ?Nathan Nicholson is a 66 y.o. 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 Karenann Cai on 06/17/21.  ? ?Of note, pathology revealed NO metastatic carcinoma.  ? ?Patient tolerated T 11 and T 12 KP well, however, recovery was complicated by persistent severe back pain. MRI on spine was ordered for further evaluation per Dr. Karenann Cai; however, patient was not able to tolerate MRI as outpatient due sever back. Patient was advised to go to ED for pain management and MRI with higher level of pain control/sedation, and underwent MRI of C,T,L spine on 4/28 which revealed new T10, L1 and L2 compression fractures.  ? ?Patient had consultation visit with Dr. Karenann Cai on 07/05/21, when KP for the new T10, L1, and L2 was recommended to the patient for pain management. After thorough discussion and shared decision making, patient decided to proceed with KP. Insurance approval was submitted after the consultation visit, the patient was only approved for KP of L11 and L2.  ? ?Patient laying in bed, not in acute distress.  ?States that he is quite frustrated due to back pain which is significantly  limiting activities of daily life.  ?Denise headache, fever, chills, shortness of breath, cough, chest pain, abdominal pain, nausea ,vomiting, and bleeding. ? ? ?Past Medical History:  ?Diagnosis Date  ? COPD (chronic obstructive pulmonary disease) ([place])   ? COVID-19   ? 03-2020  ? Dyspnea   ? with exertion  ? HLD (hyperlipidemia)   ?  Hypertension   ? Oxygen dependent   ? 2L via [place]  ? Pneumonia   ? Rheumatoid arthritis ([place])   ? ? ?Past Surgical History:  ?Procedure Laterality Date  ? FOOT SURGERY    ? left  ? HERNIA REPAIR    ? IR KYPHO EA ADDL LEVEL THORACIC OR LUMBAR  06/17/2021  ? IR KYPHO THORACIC WITH BONE BIOPSY  06/17/2021  ? IR RADIOLOGIST EVAL & MGMT  05/18/2021  ? MULTIPLE TOOTH EXTRACTIONS    ? ALL TEETH EXT, DENTURES  ? RADIOLOGY WITH ANESTHESIA N/A 06/17/2021  ? Procedure: KYPHOPLASTY;  Surgeon: Pedro Earls, MD;  Location: [facility];  Service: Radiology;  Laterality: N/A;  ? SPLENECTOMY    ? TOOTH EXTRACTION    ? TOTAL HIP ARTHROPLASTY Right 08/17/2020  ? Procedure: TOTAL HIP ARTHROPLASTY ANTERIOR APPROACH;  Surgeon: Paralee Cancel, MD;  Location: [facility];  Service: Orthopedics;  Laterality: Right;  70 min  ? ? ?Allergies: ?Patient has no known allergies. ? ?Medications: ?Prior to Admission medications   ?Medication Sig Start Date End Date Taking? Authorizing Provider  ?acetaminophen (TYLENOL) 500 MG tablet Take 500 mg by mouth every 6 (six) hours as needed.    [provider]  ?albuterol (VENTOLIN HFA) 108 (90 Base) MCG/ACT inhaler Inhale 2 puffs into the lungs every 4 (four) hours as needed (Breathing). 04/15/21   [provider]  ?aspirin 81 MG EC tablet Take 81 mg by mouth every morning.    [provider]  ?atorvastatin (LIPITOR) 10 MG tablet Take 10 mg by mouth  every morning.    [provider]  ?azithromycin (ZITHROMAX) 250 MG tablet Take as directed ?Patient not taking: Reported on 06/13/2021 06/07/21   Freddi Starr, MD  ?budesonide (PULMICORT) 0.5 MG/2ML nebulizer solution Take 2 mLs (0.5 mg total) by nebulization 2 (two) times daily. ?Patient taking differently: Take 0.5 mg by nebulization 3 (three) times daily. 06/07/21   Freddi Starr, MD  ?clonazePAM (KLONOPIN) 1 MG tablet Take 1 tablet (1 mg total) by mouth as needed for up to 2 doses for anxiety (Prior to MR  scan). ?Patient not taking: Reported on 07/08/2021 07/01/21   Docia Barrier, PA  ?cyanocobalamin (,VITAMIN B-12,) 1000 MCG/ML injection Inject 1,000 mcg into the muscle every 30 (thirty) days.    [provider]  ?folic acid (FOLVITE) 1 MG tablet Take 1 mg by mouth every morning.    [provider]  ?ibuprofen (ADVIL) 200 MG tablet Take 400 mg by mouth every 8 (eight) hours as needed (pain.).    [provider]  ?ipratropium-albuterol (DUONEB) 0.5-2.5 (3) MG/3ML SOLN Take 3 mLs by nebulization 3 (three) times daily.    [provider]  ?lisinopril-hydrochlorothiazide (ZESTORETIC) 20-12.5 MG tablet Take 1 tablet by mouth daily.    [provider]  ?LORazepam (ATIVAN) 1 MG tablet Take 1 tablet (1 mg total) by mouth as needed for anxiety. ?Patient not taking: Reported on 07/08/2021 06/30/21   Docia Barrier, PA  ?methotrexate (RHEUMATREX) 2.5 MG tablet Take 10 mg by mouth every Tuesday. Caution:Chemotherapy. Protect from light.    [provider]  ?nicotine (NICODERM CQ - DOSED IN MG/24 HOURS) 14 mg/24hr patch Place 1 patch  onto the skin daily. ?Patient not taking: Reported on 06/13/2021 05/13/21 05/13/22  [name], DO  ?oxyCODONE (OXY IR/ROXICODONE) 5 MG immediate release tablet Take 1-2 tablets (5-10 mg total) by mouth every 6 (six) hours as needed for breakthrough pain or severe pain. 07/02/21 08/31/21  [name], PA-C  ?OXYGEN Inhale 2 L into the lungs continuous.    [provider]  ?polyethylene glycol (MIRALAX / GLYCOLAX) 17 g packet Take 17 g by mouth daily as needed for mild constipation. ?Patient not taking: Reported on 06/13/2021 05/13/21   [name], DO  ?senna-docusate (SENOKOT-S) 8.6-50 MG tablet Take 1 tablet by mouth 2 (two) times daily as needed for moderate constipation. ?Patient taking differently: Take 1 tablet by mouth daily. 05/13/21   [name], DO  ?Skin Protectants, Misc. (EUCERIN) cream Apply 1  application. topically daily.    [provider]  ?Vitamin D, Ergocalciferol, (DRISDOL) 1.25 MG (50000 UNIT) CAPS capsule Take 50,000 Units by mouth every Monday.    [provider]  ?  ? ?Family History  ?Problem Relation Age of Onset  ? Heart attack Brother   ? ? ?Social History  ? ?Socioeconomic History  ? Marital status: Married  ?  Spouse name: Gurman Golon  ? Number of children: 2  ? Years of education: Not on file  ? Highest education level: Not on file  ?Occupational History  ? Not on file  ?Tobacco Use  ? Smoking status: Every Day  ?  Packs/day: 0.50  ?  Years: 40.00  ?  Pack years: 20.00  ?  Types: Cigarettes  ? Smokeless tobacco: Never  ?Vaping Use  ? Vaping Use: Never used  ?Substance and Sexual Activity  ? Alcohol use: Not Currently  ?  Comment: rare  ? Drug use: Never  ?  Sexual activity: Not Currently  ?Other Topics Concern  ? Not on file  ?Social History Narrative  ? Not on file  ? ?Social Determinants of Health  ? ?Financial Resource Strain: Not on file  ?Food Insecurity: Not on file  ?Transportation Needs: Not on file  ?Physical Activity: Not on file  ?Stress: Not on file  ?Social Connections: Not on file  ? ? ? ?Review of Systems: A 12 point ROS discussed and pertinent positives are indicated in the HPI above.  All other systems are negative. ? ?Vital Signs: ?There were no vitals taken for this visit. ? ? ?Physical Exam ?Vitals reviewed.  ?Constitutional:   ?   General: He is not in acute distress. ?   Appearance: Normal appearance. He is not ill-appearing.  ?HENT:  ?   Head: Normocephalic.  ?   Mouth/Throat:  ?   Mouth: Mucous membranes are moist.  ?   Pharynx: Oropharynx is clear.  ?Cardiovascular:  ?   Rate and Rhythm: Normal rate and regular rhythm.  ?   Heart sounds: Normal heart sounds.  ?Pulmonary:  ?   Effort: Pulmonary effort is normal.  ?   Comments: Diminished lung sound all lobes, hx COPD ?Abdominal:  ?   General: Abdomen is flat. Bowel sounds are normal.  ?    Palpations: Abdomen is soft.  ?Musculoskeletal:     ?   General: Tenderness present.  ?   Comments: TTP on thoracolumbar spine ?Well healed 4 puncture sites from previous KP site   ?Skin: ?   General: Skin is warm and dry.  ?Neuro

## 2021-07-11 NOTE — Procedures (Signed)
INTERVENTIONAL NEURORADIOLOGY BRIEF POSTPROCEDURE NOTE ?  ?FLUOROSCOPY GUIDED L1 AND L2 CORE BIOPSY AND KYPHOPLASTY ?  ?Attending: Dr. Pedro Earls ?  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  ?Access site: Percutaneous. ?  ?Anesthesia: GETA ?  ?Medication used: Refer to anesthesia documentation. ?  ?Complications: None. ?  ?Estimated blood loss: None. ?  ?Specimen: L1 and L2 core bone biopsy. ?  ?Bilateral transpedicular approach utilized for bolloon kyphoplasty at L1 and L2.  ?  ?The patient tolerated the procedure well without incident or complication and is in stable condition.  ?

## 2021-07-11 NOTE — Progress Notes (Signed)
Patient and wife waiting in phase II for family to arrive with transportation to d/c home. Resting comfortably with call bell within reach. Vital signs stable. ?

## 2021-07-11 NOTE — Transfer of Care (Signed)
Immediate Anesthesia Transfer of Care Note ? ?Patient: Nathan Nicholson ? ?Procedure(s) Performed: KYPHOPLASTY ? ?Patient Location: PACU ? ?Anesthesia Type:General ? ?Level of Consciousness: awake and drowsy ? ?Airway & Oxygen Therapy: Patient Spontanous Breathing and Patient connected to nasal cannula oxygen ? ?Post-op Assessment: Report given to RN and Post -op Vital signs reviewed and stable ? ?Post vital signs: Reviewed and stable ? ?Last Vitals:  ?Vitals Value Taken Time  ?BP 104/64 07/11/21 1346  ?Temp    ?Pulse 85 07/11/21 1350  ?Resp 19 07/11/21 1350  ?SpO2 91 % 07/11/21 1350  ?Vitals shown include unvalidated device data. ? ?Last Pain:  ?Vitals:  ? 07/11/21 0852  ?TempSrc:   ?PainSc: 0-No pain  ?   ? ?  ? ?Complications: No notable events documented. ?

## 2021-07-11 NOTE — Anesthesia Procedure Notes (Signed)
Procedure Name: Intubation ?Date/Time: 07/11/2021 11:44 AM ?Performed by: Dorann Lodge, CRNA ?Pre-anesthesia Checklist: Patient identified, Emergency Drugs available, Suction available and Patient being monitored ?Patient Re-evaluated:Patient Re-evaluated prior to induction ?Oxygen Delivery Method: Circle System Utilized ?Preoxygenation: Pre-oxygenation with 100% oxygen ?Induction Type: IV induction ?Ventilation: Mask ventilation without difficulty and Oral airway inserted - appropriate to patient size ?Laryngoscope Size: Mac and 4 ?Grade View: Grade I ?Tube type: Oral ?Tube size: 7.5 mm ?Number of attempts: 1 ?Airway Equipment and Method: Stylet and Oral airway ?Placement Confirmation: ETT inserted through vocal cords under direct vision, positive ETCO2 and breath sounds checked- equal and bilateral ?Secured at: 22 cm ?Tube secured with: Tape ?Dental Injury: Teeth and Oropharynx as per pre-operative assessment  ? ? ? ? ?

## 2021-07-11 NOTE — Anesthesia Preprocedure Evaluation (Signed)
Anesthesia Evaluation  ?Patient identified by MRN, date of birth, ID band ?Patient awake ? ? ? ?Reviewed: ?Allergy & Precautions, NPO status , Patient's Chart, lab work & pertinent test results ? ?Airway ?Mallampati: III ? ?TM Distance: >3 FB ?Neck ROM: Full ? ? ? Dental ? ?(+) Edentulous Upper, Edentulous Lower ?  ?Pulmonary ?shortness of breath, with exertion and Long-Term Oxygen Therapy, pneumonia, resolved, COPD (2LPM [place]),  COPD inhaler and oxygen dependent, Current Smoker and Patient abstained from smoking.,  ?20 pack year history, down to 1/4ppd every few weeks ?Had sleep study a long time ago, doesn't remember results ?Still snores at night  ?Recent COPD exacerbation 06/07/21, still w/ productive cough ?Was evaluated by pulm at time of COPD exacerbation and treated ?  ?breath sounds clear to auscultation ?+ decreased breath sounds ? ? ? ? ? Cardiovascular ?hypertension, Pt. on medications ?Normal cardiovascular exam ?Rhythm:Regular Rate:Normal ? ? ?  ?Neuro/Psych ?negative neurological ROS ? negative psych ROS  ? GI/Hepatic ?negative GI ROS, Neg liver ROS,   ?Endo/Other  ?neg diabetesObesity BMI 35  ?Hyperlipidemia ? Renal/GU ?negative Renal ROS  ?negative genitourinary ?  ?Musculoskeletal ? ?(+) Arthritis , Osteoarthritis and Rheumatoid disorders,  Chronic back pain  ?Compression Fx L2 ?Hx/o Kyphoplasty T11/ T12 last month  ? Abdominal ?(+) + obese,   ?Peds ? Hematology ?negative hematology ROS ?(+)   ?Anesthesia Other Findings ? ? Reproductive/Obstetrics ?negative OB ROS ? ?  ? ? ? ? ? ? ? ? ? ? ? ? ? ?  ?  ? ? ? ? ? ? ? ? ?Anesthesia Physical ? ?Anesthesia Plan ? ?ASA: 4 ? ?Anesthesia Plan: General  ? ?Post-op Pain Management: Ofirmev IV (intra-op)*  ? ?Induction: Intravenous ? ?PONV Risk Score and Plan: 2 and Ondansetron, Dexamethasone and Treatment may vary due to age or medical condition ? ?Airway Management Planned: Oral ETT ? ?Additional Equipment: None ? ?Intra-op  Plan:  ? ?Post-operative Plan: Extubation in OR and Possible Post-op intubation/ventilation ? ?Informed Consent: I have reviewed the patients History and Physical, chart, labs and discussed the procedure including the risks, benefits and alternatives for the proposed anesthesia with the patient or authorized representative who has indicated his/her understanding and acceptance.  ? ? ? ?Dental advisory given ? ?Plan Discussed with: CRNA and Anesthesiologist ? ?Anesthesia Plan Comments: ( ?)  ? ? ? ? ? ? ?Anesthesia Quick Evaluation ? ?

## 2021-07-11 NOTE — Anesthesia Postprocedure Evaluation (Signed)
Anesthesia Post Note ? ?Patient: Nathan Nicholson ? ?Procedure(s) Performed: KYPHOPLASTY ? ?  ? ?Patient location during evaluation: PACU ?Anesthesia Type: General ?Level of consciousness: awake and alert and oriented ?Pain management: pain level controlled ?Vital Signs Assessment: post-procedure vital signs reviewed and stable ?Respiratory status: spontaneous breathing, nonlabored ventilation and respiratory function stable ?Cardiovascular status: blood pressure returned to baseline and stable ?Postop Assessment: no apparent nausea or vomiting ?Anesthetic complications: no ? ? ?No notable events documented. ? ?Last Vitals:  ?Vitals:  ? 07/11/21 1430 07/11/21 1445  ?BP: (!) 91/59 (!) 96/59  ?Pulse: 81 75  ?Resp: 16 15  ?Temp:  36.6 ?C  ?SpO2: 92% 92%  ?  ?Last Pain:  ?Vitals:  ? 07/11/21 1445  ?TempSrc:   ?PainSc: 0-No pain  ? ? ?LLE Motor Response: Purposeful movement;Responds to commands (07/11/21 1445) ?LLE Sensation: Full sensation (07/11/21 1445) ?RLE Motor Response: Purposeful movement;Responds to commands (07/11/21 1445) ?RLE Sensation: Full sensation (07/11/21 1445) ?  ?  ? ?Aniayah Alaniz A. ? ? ? ? ?

## 2021-07-12 ENCOUNTER — Encounter (HOSPITAL_COMMUNITY): Payer: Self-pay | Admitting: Neuroradiology

## 2021-07-12 HISTORY — PX: IR KYPHO LUMBAR INC FX REDUCE BONE BX UNI/BIL CANNULATION INC/IMAGING: IMG5519

## 2021-07-12 HISTORY — PX: IR KYPHO EA ADDL LEVEL THORACIC OR LUMBAR: IMG5520

## 2021-07-13 ENCOUNTER — Encounter (HOSPITAL_COMMUNITY): Payer: Self-pay

## 2021-07-13 DIAGNOSIS — Z6833 Body mass index (BMI) 33.0-33.9, adult: Secondary | ICD-10-CM | POA: Diagnosis not present

## 2021-07-13 DIAGNOSIS — S22080A Wedge compression fracture of T11-T12 vertebra, initial encounter for closed fracture: Secondary | ICD-10-CM | POA: Diagnosis not present

## 2021-07-13 DIAGNOSIS — Z79899 Other long term (current) drug therapy: Secondary | ICD-10-CM | POA: Diagnosis not present

## 2021-07-13 DIAGNOSIS — M8589 Other specified disorders of bone density and structure, multiple sites: Secondary | ICD-10-CM | POA: Diagnosis not present

## 2021-07-13 DIAGNOSIS — D72829 Elevated white blood cell count, unspecified: Secondary | ICD-10-CM | POA: Diagnosis not present

## 2021-07-13 DIAGNOSIS — J069 Acute upper respiratory infection, unspecified: Secondary | ICD-10-CM | POA: Diagnosis not present

## 2021-07-14 ENCOUNTER — Telehealth: Payer: Self-pay | Admitting: Pulmonary Disease

## 2021-07-14 NOTE — Telephone Encounter (Signed)
Called patient's wife but she did not answer. Left message for her to call us back.  

## 2021-07-15 DIAGNOSIS — E039 Hypothyroidism, unspecified: Secondary | ICD-10-CM | POA: Diagnosis not present

## 2021-07-15 DIAGNOSIS — Z87891 Personal history of nicotine dependence: Secondary | ICD-10-CM | POA: Diagnosis not present

## 2021-07-15 DIAGNOSIS — J9611 Chronic respiratory failure with hypoxia: Secondary | ICD-10-CM | POA: Diagnosis not present

## 2021-07-15 DIAGNOSIS — K219 Gastro-esophageal reflux disease without esophagitis: Secondary | ICD-10-CM | POA: Diagnosis not present

## 2021-07-15 DIAGNOSIS — M1990 Unspecified osteoarthritis, unspecified site: Secondary | ICD-10-CM | POA: Diagnosis not present

## 2021-07-15 DIAGNOSIS — Z9981 Dependence on supplemental oxygen: Secondary | ICD-10-CM | POA: Diagnosis not present

## 2021-07-15 DIAGNOSIS — Z79899 Other long term (current) drug therapy: Secondary | ICD-10-CM | POA: Diagnosis not present

## 2021-07-15 DIAGNOSIS — J189 Pneumonia, unspecified organism: Secondary | ICD-10-CM | POA: Diagnosis not present

## 2021-07-15 DIAGNOSIS — R6510 Systemic inflammatory response syndrome (SIRS) of non-infectious origin without acute organ dysfunction: Secondary | ICD-10-CM | POA: Diagnosis not present

## 2021-07-15 DIAGNOSIS — J440 Chronic obstructive pulmonary disease with acute lower respiratory infection: Secondary | ICD-10-CM | POA: Diagnosis not present

## 2021-07-15 DIAGNOSIS — I10 Essential (primary) hypertension: Secondary | ICD-10-CM | POA: Diagnosis not present

## 2021-07-15 DIAGNOSIS — J9811 Atelectasis: Secondary | ICD-10-CM | POA: Diagnosis not present

## 2021-07-15 DIAGNOSIS — R0602 Shortness of breath: Secondary | ICD-10-CM | POA: Diagnosis not present

## 2021-07-15 DIAGNOSIS — J441 Chronic obstructive pulmonary disease with (acute) exacerbation: Secondary | ICD-10-CM | POA: Diagnosis not present

## 2021-07-15 DIAGNOSIS — E119 Type 2 diabetes mellitus without complications: Secondary | ICD-10-CM | POA: Diagnosis not present

## 2021-07-15 DIAGNOSIS — J180 Bronchopneumonia, unspecified organism: Secondary | ICD-10-CM | POA: Diagnosis not present

## 2021-07-15 DIAGNOSIS — A419 Sepsis, unspecified organism: Secondary | ICD-10-CM | POA: Diagnosis not present

## 2021-07-15 DIAGNOSIS — Z7982 Long term (current) use of aspirin: Secondary | ICD-10-CM | POA: Diagnosis not present

## 2021-07-15 DIAGNOSIS — E7800 Pure hypercholesterolemia, unspecified: Secondary | ICD-10-CM | POA: Diagnosis not present

## 2021-07-15 DIAGNOSIS — Z792 Long term (current) use of antibiotics: Secondary | ICD-10-CM | POA: Diagnosis not present

## 2021-07-15 DIAGNOSIS — R059 Cough, unspecified: Secondary | ICD-10-CM | POA: Diagnosis not present

## 2021-07-15 DIAGNOSIS — Z7952 Long term (current) use of systemic steroids: Secondary | ICD-10-CM | POA: Diagnosis not present

## 2021-07-15 NOTE — Telephone Encounter (Signed)
Called and spoke with patient's wife who is requesting for patient to get portable oxygen. Advised her that patient would have to come in to be seen and walked first. Patient has been scheduled for Monday. Nothing further needed at this time. ?

## 2021-07-18 ENCOUNTER — Telehealth (HOSPITAL_COMMUNITY): Payer: Self-pay

## 2021-07-18 ENCOUNTER — Ambulatory Visit: Payer: Medicare HMO | Admitting: Primary Care

## 2021-07-18 DIAGNOSIS — R059 Cough, unspecified: Secondary | ICD-10-CM | POA: Diagnosis not present

## 2021-07-18 DIAGNOSIS — J9811 Atelectasis: Secondary | ICD-10-CM | POA: Diagnosis not present

## 2021-07-18 LAB — SURGICAL PATHOLOGY

## 2021-07-18 NOTE — Telephone Encounter (Signed)
Called to schedule f/u with Dr. Quay Burow, spoke with wife and he is currently in the hospital with pneumonia. I will call back later this week to check and possibly schedule. AW  ?

## 2021-07-20 DIAGNOSIS — J440 Chronic obstructive pulmonary disease with acute lower respiratory infection: Secondary | ICD-10-CM | POA: Diagnosis not present

## 2021-07-20 DIAGNOSIS — J180 Bronchopneumonia, unspecified organism: Secondary | ICD-10-CM | POA: Diagnosis not present

## 2021-07-20 DIAGNOSIS — A419 Sepsis, unspecified organism: Secondary | ICD-10-CM | POA: Diagnosis not present

## 2021-07-21 ENCOUNTER — Encounter: Payer: Self-pay | Admitting: *Deleted

## 2021-07-21 ENCOUNTER — Other Ambulatory Visit: Payer: Self-pay | Admitting: *Deleted

## 2021-07-21 NOTE — Patient Outreach (Signed)
[facility]) Care Management  07/21/2021  Nathan Nicholson Jan 26, 1956 [id number]  Initial telephone outreach was unsuccessful. Left a message and requested a return call. Also sent NP information by text. Will call again tomorrow.  Zara Council. Burgess Estelle, MSN, [facility] Gerontological Nurse Practitioner [facility] Care Management [phone]

## 2021-07-21 NOTE — Patient Outreach (Signed)
[facility]) Care Management Geriatric Nurse Practitioner Note   07/21/2021 Name:  Nathan Nicholson MRN:  [id number] DOB:  June 14, 1955  Summary: Transition of care call completed. Pt has meds, f/u appts with pulmonology, primary care. Surgical to be scheduled. Pt is stable. Post hospitalization for pneumonia and compression fxs.  Recommendations/Changes made from today's visit: Follow MD instructions  Subjective: Nathan Nicholson is an 66 y.o. year old male who is a primary patient of Windle Guard, Johnney Killian, NP. The care management team was consulted for assistance with care management and/or care coordination needs.    Geriatric Nurse Practitioner completed Telephone Visit today.   Patient was recently discharged from hospital and all medications have been reviewed. Outpatient Encounter Medications as of 07/21/2021  Medication Sig Note   acetaminophen (TYLENOL) 500 MG tablet Take 500 mg by mouth every 6 (six) hours as needed.    albuterol (VENTOLIN HFA) 108 (90 Base) MCG/ACT inhaler Inhale 2 puffs into the lungs every 4 (four) hours as needed (Breathing).    aspirin 81 MG EC tablet Take 81 mg by mouth every morning.    atorvastatin (LIPITOR) 10 MG tablet Take 10 mg by mouth every morning.    cyanocobalamin (,VITAMIN B-12,) 1000 MCG/ML injection Inject 1,000 mcg into the muscle every 30 (thirty) days.    doxycycline (VIBRAMYCIN) 100 MG capsule Take 100 mg by mouth 2 (two) times daily.    folic acid (FOLVITE) 1 MG tablet Take 1 mg by mouth every morning.    ipratropium-albuterol (DUONEB) 0.5-2.5 (3) MG/3ML SOLN Take 3 mLs by nebulization 3 (three) times daily.    methotrexate (RHEUMATREX) 2.5 MG tablet Take 10 mg by mouth every Tuesday. Caution:Chemotherapy. Protect from light. 07/21/2021: Will start next Tuesday and only on Tuesdays.   oxyCODONE (OXY IR/ROXICODONE) 5 MG immediate release tablet Take 1-2 tablets (5-10 mg total) by mouth every 6 (six) hours as needed for breakthrough  pain or severe pain.    OXYGEN Inhale 2 L into the lungs continuous.    senna-docusate (SENOKOT-S) 8.6-50 MG tablet Take 1 tablet by mouth 2 (two) times daily as needed for moderate constipation.    Skin Protectants, Misc. (EUCERIN) cream Apply 1 application. topically daily.    Vitamin D, Ergocalciferol, (DRISDOL) 1.25 MG (50000 UNIT) CAPS capsule Take 50,000 Units by mouth every Monday.    budesonide (PULMICORT) 0.5 MG/2ML nebulizer solution Take 2 mLs (0.5 mg total) by nebulization 2 (two) times daily. (Patient taking differently: Take 0.5 mg by nebulization 3 (three) times daily.)    lisinopril-hydrochlorothiazide (ZESTORETIC) 20-12.5 MG tablet Take 1 tablet by mouth daily. (Patient not taking: Reported on 07/21/2021) 07/21/2021: Holding due to hypotension.   [DISCONTINUED] azithromycin (ZITHROMAX) 250 MG tablet Take as directed (Patient not taking: Reported on 06/13/2021) 06/13/2021: Did not complete made him sick   [DISCONTINUED] clonazePAM (KLONOPIN) 1 MG tablet Take 1 tablet (1 mg total) by mouth as needed for up to 2 doses for anxiety (Prior to MR scan). (Patient not taking: Reported on 07/08/2021)    [DISCONTINUED] ibuprofen (ADVIL) 200 MG tablet Take 400 mg by mouth every 8 (eight) hours as needed (pain.).    [DISCONTINUED] LORazepam (ATIVAN) 1 MG tablet Take 1 tablet (1 mg total) by mouth as needed for anxiety. (Patient not taking: Reported on 07/08/2021)    [DISCONTINUED] nicotine (NICODERM CQ - DOSED IN MG/24 HOURS) 14 mg/24hr patch Place 1 patch  onto the skin daily. (Patient not taking: Reported on 06/13/2021)    [DISCONTINUED] polyethylene  glycol (MIRALAX / GLYCOLAX) 17 g packet Take 17 g by mouth daily as needed for mild constipation. (Patient not taking: Reported on 06/13/2021)    No facility-administered encounter medications on file as of 07/21/2021.   SDOH:  (Social Determinants of Health) assessments and interventions performed:  SDOH Interventions    Flowsheet Row Most Recent Value   SDOH Interventions   Food Insecurity Interventions Intervention Not Indicated  Transportation Interventions Intervention Not Indicated      Care Plan  Review of patient past medical history, allergies, medications, health status, including review of consultants reports, laboratory and other test data, was performed as part of comprehensive evaluation for care management services.   GOALS:  Patient will have reduced pain <5/10 per pt report on each call during the next 30 days.  Patient will follow COPD ACTION PLAN and report YELLOW ZONE to provider over the next 3 months.  NP to send basic DM diet guidelines and COPD ACTION PLAN  Plan: Telephone follow up appointment with care management team member scheduled for:  Next Wed. Pt and wife agree to plan of care.  Zara Council. Burgess Estelle, MSN, [facility] Gerontological Nurse Practitioner [facility] Care Management [phone]

## 2021-07-21 NOTE — Patient Outreach (Signed)
Received a hospital discharge notification for Mr. Berley . I have assigned Almetta Lovely, NP to call for follow up and determine if there are any Case Management needs.    Iverson Alamin, Donivan Scull [facility] Care Management Assistant [facility] Care Management [phone]

## 2021-07-21 NOTE — Patient Instructions (Addendum)
Visit Information  Thank you for taking time to visit with me today. Please don't hesitate to contact me if I can be of assistance to you before our next scheduled telephone appointment.  Our next appointment is Wednesday  at 12:00 pm.  Following is a copy of your care plan goals:   Patient will have reduced pain <5/10 per pt report on each call during the next 30 days.   Patient will follow COPD ACTION PLAN and report YELLOW ZONE to provider over the next 3 months.    The patient verbalized understanding of instructions, educational materials, and care plan provided today and agreed to receive a mailed copy of patient instructions, educational materials, and care plan.   Telephone follow up appointment with care management team member scheduled for:  Zara Council. Burgess Estelle, MSN, [facility] Gerontological Nurse Practitioner [facility] Care Management [phone]

## 2021-07-22 ENCOUNTER — Ambulatory Visit: Payer: Medicare HMO | Admitting: *Deleted

## 2021-07-26 ENCOUNTER — Telehealth (HOSPITAL_COMMUNITY): Payer: Self-pay

## 2021-07-26 NOTE — Telephone Encounter (Signed)
Called to schedule f/u consult with Dr. Rodrigues, no answer, left vm. AW  

## 2021-07-27 ENCOUNTER — Encounter: Payer: Self-pay | Admitting: *Deleted

## 2021-07-27 ENCOUNTER — Other Ambulatory Visit: Payer: Self-pay | Admitting: *Deleted

## 2021-07-27 DIAGNOSIS — Z79899 Other long term (current) drug therapy: Secondary | ICD-10-CM | POA: Diagnosis not present

## 2021-07-27 DIAGNOSIS — Z09 Encounter for follow-up examination after completed treatment for conditions other than malignant neoplasm: Secondary | ICD-10-CM | POA: Diagnosis not present

## 2021-07-27 DIAGNOSIS — Z6832 Body mass index (BMI) 32.0-32.9, adult: Secondary | ICD-10-CM | POA: Diagnosis not present

## 2021-07-27 DIAGNOSIS — J189 Pneumonia, unspecified organism: Secondary | ICD-10-CM | POA: Diagnosis not present

## 2021-07-27 DIAGNOSIS — E1169 Type 2 diabetes mellitus with other specified complication: Secondary | ICD-10-CM | POA: Diagnosis not present

## 2021-07-27 DIAGNOSIS — E785 Hyperlipidemia, unspecified: Secondary | ICD-10-CM | POA: Diagnosis not present

## 2021-07-27 NOTE — Patient Outreach (Signed)
[facility]) Care Management Geriatric Nurse Practitioner Note   07/27/2021 Name:  Nathan Nicholson MRN:  [id number] DOB:  Aug 12, 1955  Summary: NO BACK PAIN REPORTED TODAY. COPD STABLE.  Recommendations/Changes made from today's visit: Continue to limit physical activity, lifting, twisting until follow up ortho appt. Follow COPD Action Plan attend pulmonology visit tomorrow.  Subjective: Nathan Nicholson is an 66 y.o. year old male who is a primary patient of [name], NP. The care management team was consulted for assistance with care management and/or care coordination needs.    Geriatric Nurse Practitioner completed Telephone Visit today.   Objective:  Medications Reviewed Today     Reviewed by Almetta Lovely, NP (Nurse Practitioner) on 07/21/21 at 1714  Med List Status: <None>   Medication Order Taking? Sig Documenting Provider Last Dose Status Informant  acetaminophen (TYLENOL) 500 MG tablet [id number] Yes Take 500 mg by mouth every 6 (six) hours as needed. [provider] Taking Active Spouse/Significant Other  albuterol (VENTOLIN HFA) 108 (90 Base) MCG/ACT inhaler [id number] Yes Inhale 2 puffs into the lungs every 4 (four) hours as needed (Breathing). [provider] Taking Active Spouse/Significant Other  aspirin 81 MG EC tablet [id number] Yes Take 81 mg by mouth every morning. [provider] Taking Active Spouse/Significant Other  atorvastatin (LIPITOR) 10 MG tablet [id number] Yes Take 10 mg by mouth every morning. [provider] Taking Active Spouse/Significant Other  budesonide (PULMICORT) 0.5 MG/2ML nebulizer solution [id number]  Take 2 mLs (0.5 mg total) by nebulization 2 (two) times daily.  Patient taking differently: Take 0.5 mg by nebulization 3 (three) times daily.   [name], MD  Active Spouse/Significant Other  cyanocobalamin (,VITAMIN B-12,) 1000 MCG/ML injection [id number] Yes Inject 1,000 mcg into the  muscle every 30 (thirty) days. [provider] Taking Active Spouse/Significant Other  doxycycline (VIBRAMYCIN) 100 MG capsule [id number] Yes Take 100 mg by mouth 2 (two) times daily. [provider] Taking Active Spouse/Significant Other  folic acid (FOLVITE) 1 MG tablet [id number] Yes Take 1 mg by mouth every morning. [provider] Taking Active Spouse/Significant Other  ipratropium-albuterol (DUONEB) 0.5-2.5 (3) MG/3ML SOLN [id number] Yes Take 3 mLs by nebulization 3 (three) times daily. [provider] Taking Active Spouse/Significant Other  lisinopril-hydrochlorothiazide (ZESTORETIC) 20-12.5 MG tablet [id number] No Take 1 tablet by mouth daily.  Patient not taking: Reported on 07/21/2021   [provider] Not Taking Active Spouse/Significant Other           Med Note [name] Jul 21, 2021  5:10 PM) Holding due to hypotension.  methotrexate (RHEUMATREX) 2.5 MG tablet [id number] Yes Take 10 mg by mouth every Tuesday. Caution:Chemotherapy. Protect from light. [provider] Taking Active Spouse/Significant Other           Med Note [name] Jul 21, 2021  5:11 PM) Will start next Tuesday and only on Tuesdays.  oxyCODONE (OXY IR/ROXICODONE) 5 MG immediate release tablet [id number] Yes Take 1-2 tablets (5-10 mg total) by mouth every 6 (six) hours as needed for breakthrough pain or severe pain. [name], PA-C Taking Active Spouse/Significant Other  OXYGEN [id number] Yes Inhale 2 L into the lungs continuous. [provider] Taking Active Spouse/Significant Other  senna-docusate (SENOKOT-S) 8.6-50 MG tablet [id number] Yes Take 1 tablet by mouth 2 (two) times daily as needed for moderate constipation. [name], DO Taking Active Spouse/Significant Other  Skin Protectants, Misc. (EUCERIN) cream [id number] Yes Apply  1 application. topically daily. [provider] Taking Active Spouse/Significant Other   Vitamin D, Ergocalciferol, (DRISDOL) 1.25 MG (50000 UNIT) CAPS capsule [id number] Yes Take 50,000 Units by mouth every Monday. [provider] Taking Active Spouse/Significant Other             SDOH:  (Social Determinants of Health) assessments and interventions performed:  SDOH Interventions    Flowsheet Row Most Recent Value  SDOH Interventions   Financial Strain Interventions Intervention Not Indicated  Housing Interventions Intervention Not Indicated  Physical Activity Interventions Other (Comments)  [Consider Pulmonary Rehab.]  Stress Interventions Intervention Not Indicated  Social Connections Interventions Intervention Not Indicated      Care Plan  Review of patient past medical history, allergies, medications, health status, including review of consultants reports, laboratory and other test data, was performed as part of comprehensive evaluation for care management services.   Plan: Telephone follow up appointment with care management team member scheduled for:  Next week. Pt and wife agree to the plan of care.  Zara Council. Burgess Estelle, MSN, [facility] Gerontological Nurse Practitioner [facility] Care Management [phone]

## 2021-07-28 ENCOUNTER — Ambulatory Visit: Payer: Medicare HMO | Admitting: Primary Care

## 2021-07-28 ENCOUNTER — Ambulatory Visit (HOSPITAL_COMMUNITY)
Admission: RE | Admit: 2021-07-28 | Discharge: 2021-07-28 | Disposition: A | Discharge status: Home / Self Care | Payer: Medicare HMO | Source: Ambulatory Visit | Attending: Student | Admitting: Student

## 2021-07-28 ENCOUNTER — Encounter: Payer: Self-pay | Admitting: Primary Care

## 2021-07-28 ENCOUNTER — Ambulatory Visit (INDEPENDENT_AMBULATORY_CARE_PROVIDER_SITE_OTHER): Payer: Medicare HMO

## 2021-07-28 VITALS — BP 126/86 | HR 103 | Temp 98.1°F | Ht 63.0 in | Wt 188.2 lb

## 2021-07-28 DIAGNOSIS — J449 Chronic obstructive pulmonary disease, unspecified: Secondary | ICD-10-CM

## 2021-07-28 DIAGNOSIS — J9611 Chronic respiratory failure with hypoxia: Secondary | ICD-10-CM | POA: Diagnosis not present

## 2021-07-28 DIAGNOSIS — J189 Pneumonia, unspecified organism: Secondary | ICD-10-CM | POA: Diagnosis not present

## 2021-07-28 DIAGNOSIS — Y95 Nosocomial condition: Secondary | ICD-10-CM | POA: Diagnosis not present

## 2021-07-28 DIAGNOSIS — Z8701 Personal history of pneumonia (recurrent): Secondary | ICD-10-CM | POA: Diagnosis not present

## 2021-07-28 DIAGNOSIS — S32020A Wedge compression fracture of second lumbar vertebra, initial encounter for closed fracture: Secondary | ICD-10-CM

## 2021-07-28 DIAGNOSIS — J9811 Atelectasis: Secondary | ICD-10-CM | POA: Diagnosis not present

## 2021-07-28 DIAGNOSIS — R918 Other nonspecific abnormal finding of lung field: Secondary | ICD-10-CM | POA: Diagnosis not present

## 2021-07-28 IMAGING — DX DG CHEST 2V
2 series · 2 of 2 positions shown · non-contrast
Comparison: Chest radiograph [DATE]

CLINICAL DATA: History of pneumonia.  Follow-up exam.

EXAM:
CHEST - 2 VIEW

[chest pa]
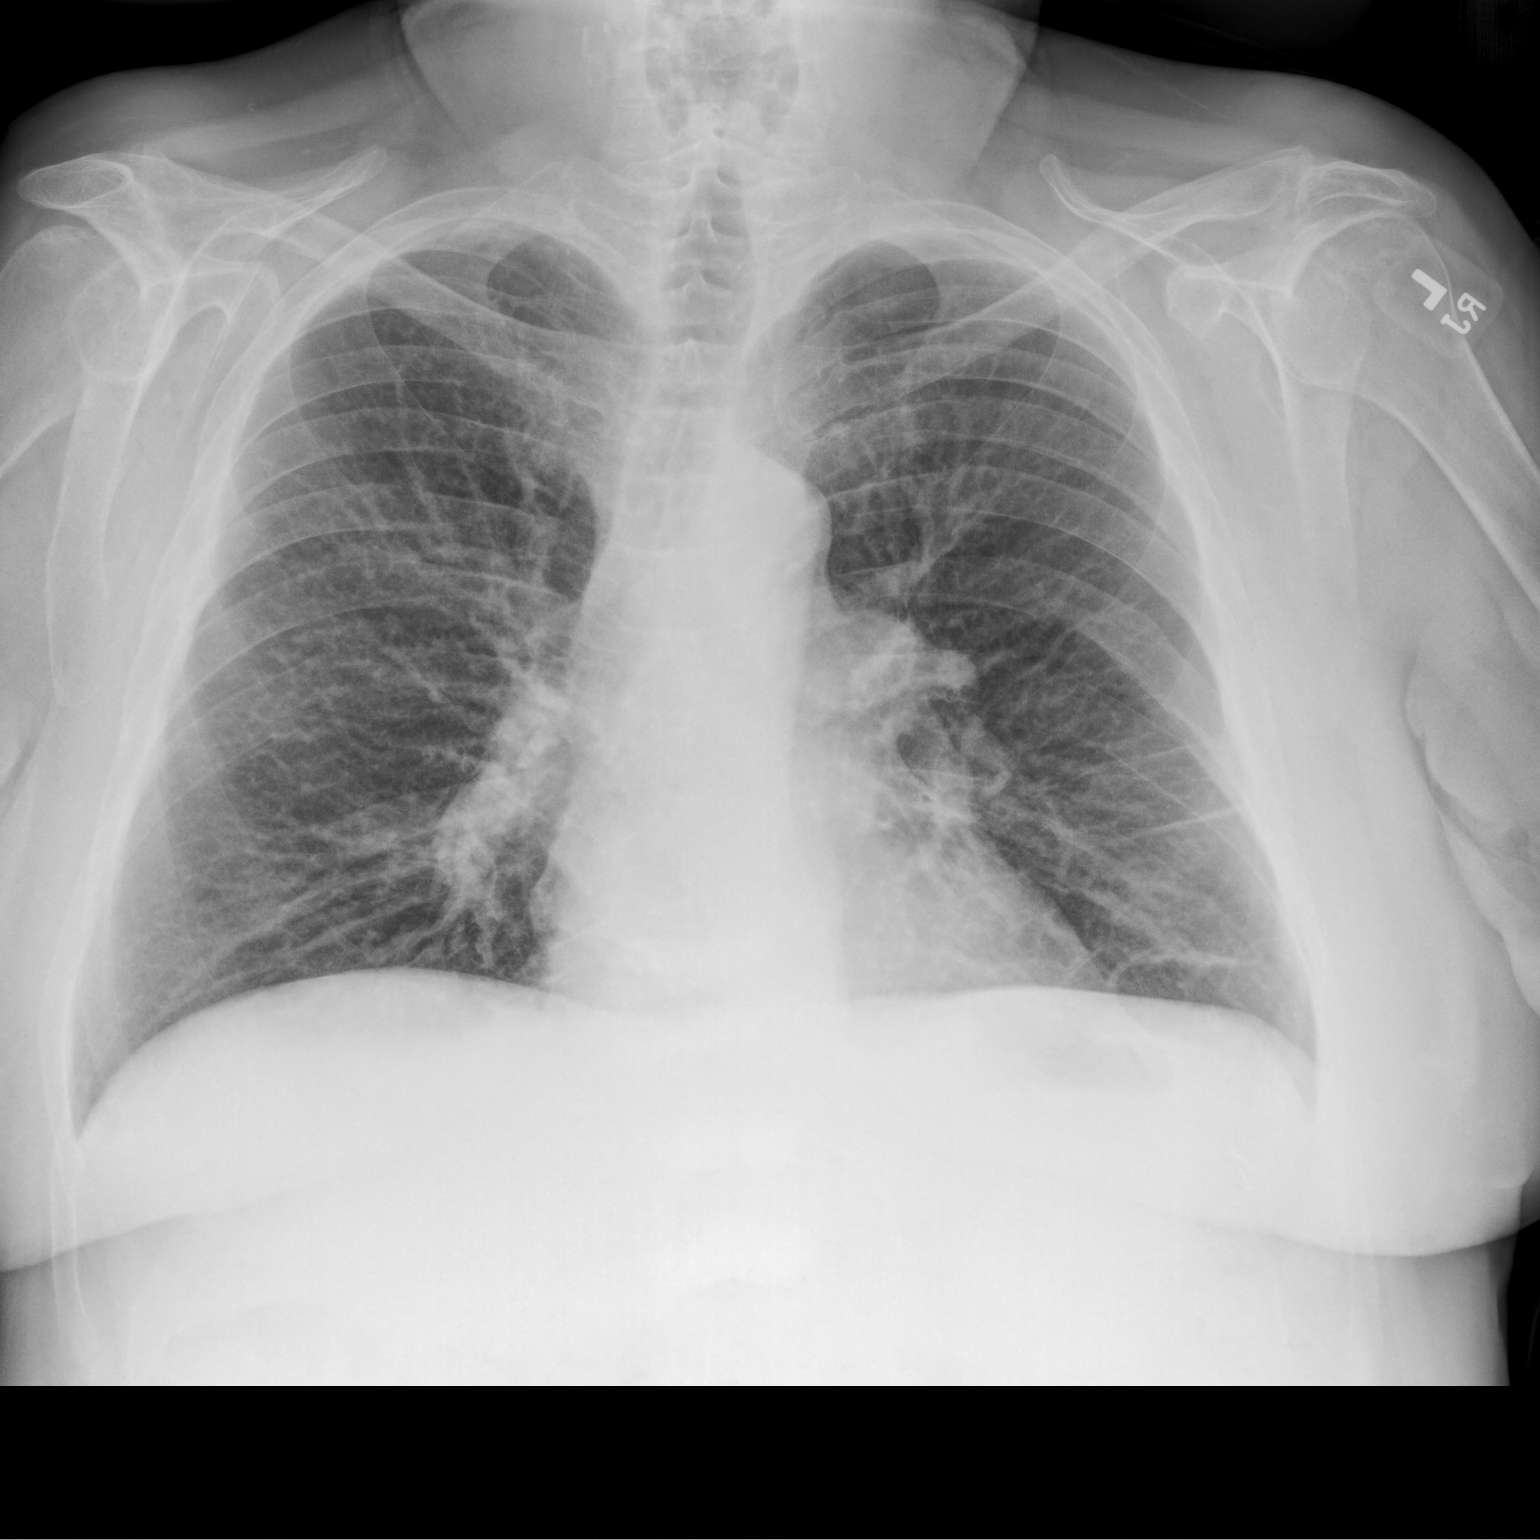

[chest lat]
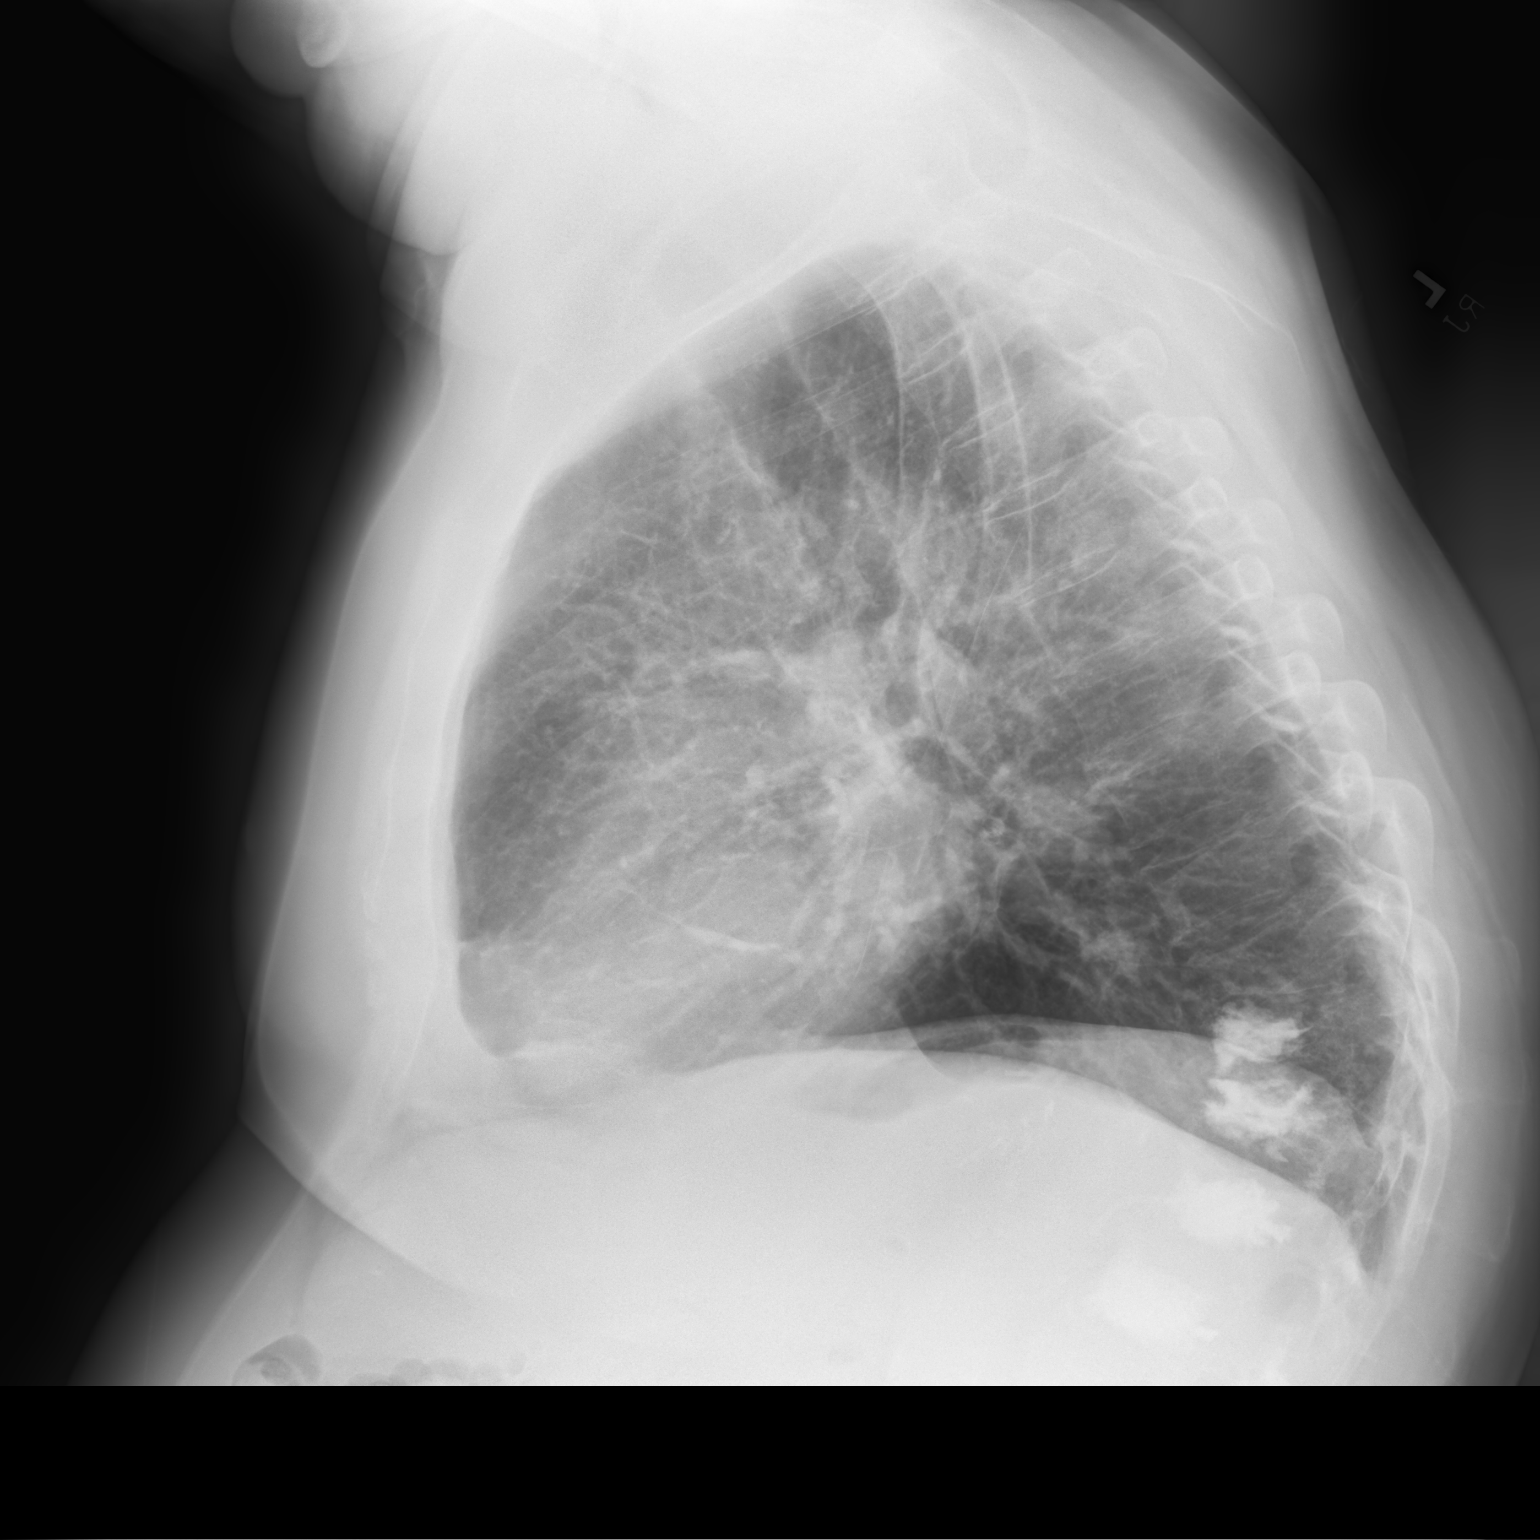

[2 of 2 positions shown; findings below may reference images not displayed]

FINDINGS: Stable cardiac and mediastinal contours. Minimal linear opacities
left lung base. No large area pulmonary consolidation. No pleural
effusion or pneumothorax. Kyphoplasty material thoracic spine.
IMPRESSION: Left basilar scarring/atelectasis.

## 2021-07-28 NOTE — Assessment & Plan Note (Signed)
-   Admitted 5/13-5/19 to [facility] for PNA following back surgery.  - Needs repeat CXR today.

## 2021-07-28 NOTE — Assessment & Plan Note (Signed)
-   Patient quailfied for POC needs to use 3L with exertion

## 2021-07-28 NOTE — Patient Instructions (Signed)
Recommendations: - Take 40mg  prednisone x 2 days; 30mg  x 2 days; 20mg  x 2 days; 10mg  x 2 days - Continue Pulmicort (budesonide) nebulizer morning and evening - Continue DuoNeb (ipratropium-albuterol) 3 times a day (morning, afternoon and evening) - Continue to wear supplemental oxygen 2L continuous oxygen (and 3 L while using POC) to maintain O2 greater than 90%  Orders: - POC at 3 L - CXR today re: CAP   Follow-up: - Recall should already be in for 5-6 months with Dr. Francine Graven

## 2021-07-28 NOTE — Assessment & Plan Note (Addendum)
-   Patient has baseline wheezing on exam  - Advised he take 40mg  prednisone x 2 days; 30mg  x 2 days; 20mg  x 2 days; 10mg  x 2 days - Continue Pulmicort (budesonide) nebulizer morning and evening - Continue DuoNeb (ipratropium-albuterol) 3 times a day (morning, afternoon and evening)

## 2021-07-28 NOTE — Progress Notes (Signed)
@Patient  ID: Nathan HarderEmerson L Nicholson, male    DOB: [birth year], 66 y.o.   MRN: [id number]  Chief Complaint  Patient presents with   Follow-up    Has oxygen at home and tanks, wants a POC.    Referring provider: Jim LikePotts, Anna S, NP  HPI: 66 year old male, former smoker.  Past medical history significant for COPD, hypertension, RA, type 2 diabetes, hyperlipidemia, obesity.  Patient of Dr. Francine Gravenewald, last seen in office on 06/07/2021 for COPD exacerbation.  07/28/2021 Patient presents today for a follow-up visit to discuss oxygen.  She was seen by Dr. Francine Gravenewald in April for COPD exacerbation treated with Z-Pak and prednisone taper she was also started on budesonide nebulizer treatment twice daily and advised to continue DuoNebs every 6 hours.  He is chronically on 2l oxygen at home. He came in today to qualify for POC. He was in the hospital for planned kypoplasy. Ended up with pneumonia. He was admitted [place] 5/13-19 for pneumonia. Discharged on Doxycyline. He is feeling better. Still coughing but not getting up as much as he previously was. Sputum is clear. He is taking budesonide twice and duoneb three times a day.    No Known Allergies  Immunization History  Administered Date(s) Administered   Pneumococcal Polysaccharide-23 03/28/2012    Past Medical History:  Diagnosis Date   COPD (chronic obstructive pulmonary disease) (HCC)    COVID-19    03-2020   Dyspnea    with exertion   HLD (hyperlipidemia)    Hypertension    Oxygen dependent    2L via [place]   Pneumonia    Rheumatoid arthritis (HCC)     Tobacco History: Social History   Tobacco Use  Smoking Status Former   Packs/day: 0.50   Years: 40.00   Pack years: 20.00   Types: Cigarettes   Quit date: 06/28/2021   Years since quitting: 0.0  Smokeless Tobacco Never   Counseling given: Not Answered   Outpatient Medications Prior to Visit  Medication Sig Dispense Refill   acetaminophen (TYLENOL) 500 MG tablet Take 500 mg by mouth  every 6 (six) hours as needed.     albuterol (VENTOLIN HFA) 108 (90 Base) MCG/ACT inhaler Inhale 2 puffs into the lungs every 4 (four) hours as needed (Breathing).     aspirin 81 MG EC tablet Take 81 mg by mouth every morning.     atorvastatin (LIPITOR) 10 MG tablet Take 10 mg by mouth every morning.     budesonide (PULMICORT) 0.5 MG/2ML nebulizer solution Take 2 mLs (0.5 mg total) by nebulization 2 (two) times daily. (Patient taking differently: Take 0.5 mg by nebulization 3 (three) times daily.) 120 mL 11   cyanocobalamin (,VITAMIN B-12,) 1000 MCG/ML injection Inject 1,000 mcg into the muscle every 30 (thirty) days.     folic acid (FOLVITE) 1 MG tablet Take 1 mg by mouth every morning.     ipratropium-albuterol (DUONEB) 0.5-2.5 (3) MG/3ML SOLN Take 3 mLs by nebulization 3 (three) times daily.     methotrexate (RHEUMATREX) 2.5 MG tablet Take 10 mg by mouth every Tuesday. Caution:Chemotherapy. Protect from light.     OXYGEN Inhale 2 L into the lungs continuous.     senna-docusate (SENOKOT-S) 8.6-50 MG tablet Take 1 tablet by mouth 2 (two) times daily as needed for moderate constipation. 20 tablet 0   Skin Protectants, Misc. (EUCERIN) cream Apply 1 application. topically daily.     Vitamin D, Ergocalciferol, (DRISDOL) 1.25 MG (50000 UNIT) CAPS capsule Take 50,000 Units by  mouth every Monday.     lisinopril-hydrochlorothiazide (ZESTORETIC) 20-12.5 MG tablet Take 1 tablet by mouth daily. (Patient not taking: Reported on 07/28/2021)     oxyCODONE (OXY IR/ROXICODONE) 5 MG immediate release tablet Take 1-2 tablets (5-10 mg total) by mouth every 6 (six) hours as needed for breakthrough pain or severe pain. (Patient not taking: Reported on 07/28/2021) 20 tablet 0   doxycycline (VIBRAMYCIN) 100 MG capsule Take 100 mg by mouth 2 (two) times daily. (Patient not taking: Reported on 07/28/2021)     No facility-administered medications prior to visit.   Review of Systems  Review of Systems  Constitutional:  Negative.   HENT: Negative.    Respiratory:  Positive for cough. Negative for chest tightness, shortness of breath and wheezing.   Cardiovascular: Negative.     Physical Exam  BP 126/86 (BP Location: Left Arm, Patient Position: Sitting, Cuff Size: Normal)   Pulse (!) 103   Temp 98.1 F (36.7 C) (Oral)   Ht 5\' 3"  (1.6 m)   Wt 188 lb 3.2 oz (85.4 kg)   SpO2 94%   BMI 33.34 kg/m  Physical Exam Constitutional:      General: He is not in acute distress.    Appearance: Normal appearance. He is not ill-appearing.  HENT:     Head: Normocephalic and atraumatic.  Cardiovascular:     Rate and Rhythm: Normal rate and regular rhythm.  Pulmonary:     Effort: Pulmonary effort is normal.     Breath sounds: Wheezing present.  Musculoskeletal:        General: Normal range of motion.     Cervical back: Normal range of motion and neck supple.  Skin:    General: Skin is warm and dry.  Neurological:     General: No focal deficit present.     Mental Status: He is alert and oriented to person, place, and time. Mental status is at baseline.  Psychiatric:        Mood and Affect: Mood normal.        Behavior: Behavior normal.        Thought Content: Thought content normal.        Judgment: Judgment normal.     Lab Results:  CBC    Component Value Date/Time   WBC 19.3 (H) 07/11/2021 0852   RBC 5.22 07/11/2021 0852   HGB 14.5 07/11/2021 0852   HGB 15.3 05/04/2021 1100   HGB 16.3 03/11/2013 0847   HCT 45.5 07/11/2021 0852   HCT 48.3 03/11/2013 0847   PLT 339 07/11/2021 0852   PLT 428 (H) 05/04/2021 1100   PLT 372 Large & giant platelets 03/11/2013 0847   MCV 87.2 07/11/2021 0852   MCV 90.2 03/11/2013 0847   MCH 27.8 07/11/2021 0852   MCHC 31.9 07/11/2021 0852   RDW 17.8 (H) 07/11/2021 0852   RDW 14.7 (H) 03/11/2013 0847   LYMPHSABS 6.8 (H) 07/01/2021 1359   LYMPHSABS 3.6 (H) 03/11/2013 0847   MONOABS 4.2 (H) 07/01/2021 1359   MONOABS 2.1 (H) 03/11/2013 0847   EOSABS 0.2  07/01/2021 1359   EOSABS 0.9 (H) 03/11/2013 0847   BASOSABS 0.1 07/01/2021 1359   BASOSABS 0.2 (H) 03/11/2013 0847    BMET    Component Value Date/Time   NA 138 07/01/2021 1359   K 3.4 (L) 07/01/2021 1359   CL 99 07/01/2021 1359   CO2 30 07/01/2021 1359   GLUCOSE 87 07/01/2021 1359   BUN 21 07/01/2021 1359   CREATININE  0.74 07/01/2021 1359   CREATININE 0.94 05/04/2021 1100   CALCIUM 9.1 07/01/2021 1359   GFRNONAA >60 07/01/2021 1359   GFRNONAA >60 05/04/2021 1100    BNP No results found for: BNP  ProBNP No results found for: PROBNP  Imaging: MR CERVICAL SPINE W WO CONTRAST  Result Date: 07/01/2021 CLINICAL DATA:  Spine surgery/procedure, postop, infection suspected; recent kyphoplasty with worsening back pain EXAM: MRI CERVICAL SPINE WITHOUT AND WITH CONTRAST TECHNIQUE: Multiplanar and multiecho pulse sequences of the cervical spine, to include the craniocervical junction and cervicothoracic junction, were obtained without and with intravenous contrast. CONTRAST:  8mL GADAVIST GADOBUTROL 1 MMOL/ML IV SOLN COMPARISON:  None. FINDINGS: Motion artifact is present. Alignment: Minor degenerative listhesis. Vertebrae: Mild degenerative endplate irregularity. No marrow edema. No suspicious osseous lesion. Cord: No abnormal signal. Posterior Fossa, vertebral arteries, paraspinal tissues: Unremarkable. Disc levels: C2-C3:  No significant canal or foraminal stenosis. C3-C4: Disc bulge with endplate osteophytes. Uncovertebral and facet hypertrophy. Moderate canal stenosis. Possible mild foraminal stenosis. C4-C5: Disc bulge with endplate osteophytes. Uncovertebral and facet hypertrophy. Mild canal stenosis. No definite foraminal stenosis. C5-C6: Disc bulge with endplate osteophytes. Uncovertebral and facet hypertrophy. Moderate to marked canal stenosis. No significant foraminal stenosis. C6-C7: Disc bulge with endplate osteophytes. Possible superimposed small left paracentral disc extrusion  extending just below disc level. Uncovertebral hypertrophy. Mild canal stenosis. No significant foraminal stenosis. C7-T1: Facet hypertrophy. No significant canal or foraminal stenosis. IMPRESSION: No acute process identified. Multilevel degenerative changes as detailed above with suboptimal stenosis evaluation due to motion degradation. Electronically Signed   By: Guadlupe Spanish M.D.   On: 07/01/2021 20:11   MR THORACIC SPINE W WO CONTRAST  Result Date: 07/01/2021 CLINICAL DATA:  Mid back pain, recent kyphoplasty EXAM: MRI THORACIC WITHOUT AND WITH CONTRAST TECHNIQUE: Multiplanar and multiecho pulse sequences of the thoracic spine were obtained without and with intravenous contrast. CONTRAST:  8mL GADAVIST GADOBUTROL 1 MMOL/ML IV SOLN COMPARISON:  04/19/2021 FINDINGS: Motion artifact is present. Alignment: Stable with mild retropulsion at T12 greater than T11. Vertebrae: Interval kyphoplasties at T11 and T12 with similar vertebral body heights. There is mild persistent marrow edema at these levels. There is new marrow edema at T10 primarily more anteriorly without height loss. Lumbar spine findings are dictated separately. Cord:  No abnormal signal.  No abnormal intrathecal enhancement. Paraspinal and other soft tissues: Unremarkable. Disc levels: No new or progressive stenosis. IMPRESSION: Interval T11 and T12 kyphoplasties without apparent complication. New marrow edema at T10 without height loss. Impending fracture is possible given findings at L1 and L2 discussed separately. Electronically Signed   By: Guadlupe Spanish M.D.   On: 07/01/2021 20:27   MR Lumbar Spine W Wo Contrast  Result Date: 07/01/2021 CLINICAL DATA:  Spine surgery/procedure, postop, infection suspected; severe back pain with recent kyphoplasty EXAM: MRI LUMBAR SPINE WITHOUT AND WITH CONTRAST TECHNIQUE: Multiplanar and multiecho pulse sequences of the lumbar spine were obtained without and with intravenous contrast. CONTRAST:  8mL  GADAVIST GADOBUTROL 1 MMOL/ML IV SOLN COMPARISON:  05/04/2021 FINDINGS: Segmentation:  Standard. Alignment:  Stable. Vertebrae: New mild L2 compression fracture with less than 50% loss of height at the superior endplate. New minor L1 compression fracture with mild wedging. There is associated marrow edema at these levels. Treated T11 and T12 compression fractures. There is unchanged mild retropulsion at T12. Conus medullaris and cauda equina: Conus extends to the T12 level. Conus and cauda equina appear normal. No abnormal intrathecal enhancement. Paraspinal and other soft tissues: Unremarkable.  Disc levels: Mild multilevel degenerative changes are stable in appearance over the short interval. No new or progressive stenosis. IMPRESSION: New acute/subacute minor L1 and mild L2 compression fractures. No endplate retropulsion. Post T11 and T12 kyphoplasties. Vertebral body height loss, alignment, and mild retropulsion are unchanged. Electronically Signed   By: Guadlupe Spanish M.D.   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 Of note, pathology revealed no evidence of malignancy. Patient tolerated T11 and T12 KP well, however, recovery was complicated by persistent severe back pain. MRI of spine was ordered for further evaluation; however, patient was not able to tolerate MRI as outpatient due severe back pain. Patient was advised to go to ED for pain management and MRI with higher level of pain control/sedation, and underwent MRI of C,T,L spine on 4/28 which revealed new T10, L1 and L2 compression fractures. Patient was  evaluated on 07/05/21 and was found to head severe back pain limiting activities of daily life. He has moderately severe pain in rest and severe pain with any movement. After peer to peer review, insurance has approved vertebral augmentation at the L1 and L2 levels. EXAM: FLUOROSCOPY GUIDED L1 AND L2 CORE BONE BIOPSY AND BALLOON KYPHOPLASTY COMPARISON:  MRI of the cervical, thoracic and lumbar spine July 01, 2021. MEDICATIONS: As antibiotic prophylaxis, Ancef 2 g IV was ordered pre-procedure and administered intravenously within 1 hour of incision. All current medications are in the EMR and have been reviewed as part of this encounter. ANESTHESIA/SEDATION: The procedure was performed under general anesthesia. FLUOROSCOPY: Radiation Exposure Index (as provided by the fluoroscopic device): PSD 3142 mGy Kerma COMPLICATIONS: None immediate. PROCEDURE: Following a full explanation of the procedure along with the potential associated complications, an informed witnessed consent was obtained. The patient was placed in prone position on the angiography table. The lumbar spine region was prepped and draped in a sterile fashion. Under fluoroscopy, the L1 vertebral body was delineated and the skin area was marked. The skin was infiltrated with a 1% Lidocaine approximately 3.5 cm lateral to the spinous process projection on the right. Using a 22-gauge spinal needle, the soft issue and the peripedicular space and periosteum were infiltrated with Bupivacaine 0.5%. A skin incision was made at the access site. Subsequently, an 11-gauge Kyphon trocar was inserted under fluoroscopic guidance until contact with the pedicle was obtained. The trocar was inserted under light hammer tapping into the pedicle until the posterior boundaries of the vertebral body was reached. The diamond mandrill was removed and one core biopsy wasobtained. The skin was infiltrated with a 1% Lidocaine approximately 3.5 cm lateral to the spinous process  projection on the left. Using a 22-gauge spinal needle, the soft issue and the peripedicular space and periosteum were infiltrated with Bupivacaine 0.5%. A skin incision was made at the access site. Subsequently, an 11-gauge Kyphon trocar was inserted under fluoroscopic guidance until contact with the pedicle was obtained. The trocar was inserted under light hammer tapping into the pedicle until the posterior boundaries of the vertebral body was reached. The diamond mandrill was removed. A bone drill was coaxially advanced within the anterior third of the vertebral body  and then exchanged for inflatable Kyphon balloons. These were centered within the mid-aspect of the vertebral body. The balloons were inflated to create a void to serve as a repository for the bone cement. Both balloons were deflated and through both cannulas, under continuous fluoroscopy guidance in the AP and lateral views, the vertebral body was filled with previously mixed polymethyl-methacrylate (PMMA) added to barium for opacification. Both cannulas were later removed. Under fluoroscopy, the L2 vertebral body was delineated and the skin area was marked. The skin was infiltrated with a 1% Lidocaine approximately 3.7 cm lateral to the spinous process projection on the right. Using a 22-gauge spinal needle, the soft issue and the peripedicular space and periosteum were infiltrated with Bupivacaine 0.5%. A skin incision was made at the access site. Subsequently, an 11-gauge Kyphon trocar was inserted under fluoroscopic guidance until contact with the pedicle was obtained. The trocar was inserted under light hammer tapping into the pedicle until the posterior boundaries of the vertebral body was reached. The diamond mandrill was removed and one core biopsy wasobtained. The skin was infiltrated with a 1% Lidocaine approximately 3.7 cm lateral to the spinous process projection on the left. Using a 22-gauge spinal needle, the soft issue and the  peripedicular space and periosteum were infiltrated with Bupivacaine 0.5%. A skin incision was made at the access site. Subsequently, an 11-gauge Kyphon trocar was inserted under fluoroscopic guidance until contact with the pedicle was obtained. The trocar was inserted under light hammer tapping into the pedicle until the posterior boundaries of the vertebral body was reached. The diamond mandrill was removed. A bone drill was coaxially advanced within the anterior third of the vertebral body and then exchanged for inflatable Kyphon balloons. These were centered within the mid-aspect of the vertebral body. The balloons were inflated to create a void to serve as a repository for the bone cement. Both balloons were deflated and through both cannulas, under continuous fluoroscopy guidance in the AP and lateral views, the vertebral body was filled with previously mixed polymethyl-methacrylate (PMMA) added to barium for opacification. Both cannulas were later removed. The access sites were cleaned and covered with a sterile bandage. IMPRESSION: 1. 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 Bone samples obtained were sent for pathology analysis. 2. If the patient has known osteoporosis, recommend treatment as clinically indicated. If the patient's bone density status is unknown, DEXA scan is recommended. Electronically Signed   By: Baldemar Lenis M.D.   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 Of note, pathology revealed no evidence of malignancy. Patient tolerated T11 and T12 KP well, however, recovery was complicated by persistent severe back pain. MRI of spine was ordered for further evaluation; however, patient was not able to tolerate MRI as outpatient due severe back pain. Patient was advised to go to ED for pain management and MRI with higher level of pain control/sedation, and underwent MRI of C,T,L spine on 4/28 which revealed new T10, L1 and L2 compression fractures. Patient was evaluated on 07/05/21 and was found to head severe  back pain limiting activities of daily life. He has moderately severe pain in rest and severe pain with any movement. After peer to peer review, insurance has approved vertebral augmentation at the L1 and L2 levels. EXAM: FLUOROSCOPY GUIDED L1 AND L2 CORE BONE BIOPSY AND BALLOON KYPHOPLASTY COMPARISON:  MRI of the cervical, thoracic and lumbar spine July 01, 2021. MEDICATIONS: As antibiotic prophylaxis, Ancef 2 g IV was ordered pre-procedure and administered intravenously within 1 hour of incision. All current medications are in the EMR and have been reviewed as part of this encounter. ANESTHESIA/SEDATION: The procedure was performed under general anesthesia. FLUOROSCOPY: Radiation Exposure Index (as provided by the fluoroscopic device): PSD 3142 mGy Kerma COMPLICATIONS: None immediate. PROCEDURE: Following a full explanation of the procedure along with the potential associated complications, an informed witnessed consent was obtained. The patient was placed in prone position on the angiography table. The lumbar spine region was prepped and draped in a sterile fashion. Under fluoroscopy, the L1 vertebral body was delineated and the skin area was marked. The skin was infiltrated with a 1% Lidocaine approximately 3.5 cm lateral to the spinous process projection on the right. Using a 22-gauge spinal needle, the soft issue and the peripedicular space and periosteum  were infiltrated with Bupivacaine 0.5%. A skin incision was made at the access site. Subsequently, an 11-gauge Kyphon trocar was inserted under fluoroscopic guidance until contact with the pedicle was obtained. The trocar was inserted under light hammer tapping into the pedicle until the posterior boundaries of the vertebral body was reached. The diamond mandrill was removed and one core biopsy wasobtained. The skin was infiltrated with a 1% Lidocaine approximately 3.5 cm lateral to the spinous process projection on the left. Using a 22-gauge spinal needle, the soft issue and the peripedicular space and periosteum were infiltrated with Bupivacaine 0.5%. A skin incision was made at the access site. Subsequently, an 11-gauge Kyphon trocar was inserted under fluoroscopic guidance until contact with the pedicle was obtained. The trocar was inserted under light hammer tapping into the pedicle until the posterior boundaries of the vertebral body was reached. The diamond mandrill was removed. A bone drill was coaxially advanced within the anterior third of the vertebral body and then exchanged for inflatable Kyphon balloons. These were centered within the mid-aspect of the vertebral body. The balloons were inflated to create a void to serve as a repository for the bone cement. Both balloons were deflated and through both cannulas, under continuous fluoroscopy guidance in the AP and lateral views, the vertebral body was filled with previously mixed polymethyl-methacrylate (PMMA) added to barium for opacification. Both cannulas were later removed. Under fluoroscopy, the L2 vertebral body was delineated and the skin area was marked. The skin was infiltrated with a 1% Lidocaine approximately 3.7 cm lateral to the spinous process projection on the right. Using a 22-gauge spinal needle, the soft issue and the peripedicular space and periosteum were infiltrated with Bupivacaine 0.5%. A skin incision was made at the access site.  Subsequently, an 11-gauge Kyphon trocar was inserted under fluoroscopic guidance until contact with the pedicle was obtained. The trocar was inserted under light hammer tapping into the pedicle until the posterior boundaries of the vertebral body was reached. The diamond mandrill was removed and one core biopsy wasobtained. The skin was infiltrated with a 1% Lidocaine approximately 3.7 cm lateral to the spinous process projection on the left. Using a 22-gauge spinal needle, the soft issue and the peripedicular space and periosteum were infiltrated with  Bupivacaine 0.5%. A skin incision was made at the access site. Subsequently, an 11-gauge Kyphon trocar was inserted under fluoroscopic guidance until contact with the pedicle was obtained. The trocar was inserted under light hammer tapping into the pedicle until the posterior boundaries of the vertebral body was reached. The diamond mandrill was removed. A bone drill was coaxially advanced within the anterior third of the vertebral body and then exchanged for inflatable Kyphon balloons. These were centered within the mid-aspect of the vertebral body. The balloons were inflated to create a void to serve as a repository for the bone cement. Both balloons were deflated and through both cannulas, under continuous fluoroscopy guidance in the AP and lateral views, the vertebral body was filled with previously mixed polymethyl-methacrylate (PMMA) added to barium for opacification. Both cannulas were later removed. The access sites were cleaned and covered with a sterile bandage. IMPRESSION: 1. 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 Bone samples obtained were sent for pathology analysis. 2. If the patient has known osteoporosis, recommend treatment as clinically indicated. If the patient's bone density status is unknown, DEXA scan  is recommended. Electronically Signed   By: Baldemar Lenis M.D.   On: 07/12/2021 12:02     Assessment & Plan:   HCAP (healthcare-associated pneumonia) - Admitted 5/13-5/19 to [facility] for PNA following back surgery.  - Needs repeat CXR today.   COPD (chronic obstructive pulmonary disease) (HCC) - Patient has baseline wheezing on exam  - Advised he take  prednisone x 2 days;  x 2 days;  x 2 days;  x 2 days - Continue Pulmicort (budesonide) nebulizer morning and evening - Continue DuoNeb (ipratropium-albuterol) 3 times a day (morning, afternoon and evening)  Chronic respiratory failure with hypoxia (HCC) - Patient quailfied for POC needs to use 3L with exertion  \ Glenford Bayley, NP 07/28/2021

## 2021-07-28 NOTE — Progress Notes (Signed)
Referring Physician(s): Han,Aimee H  Chief Complaint: The patient is seen in follow up today s/p L1 and L2 kyphoplasty.  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  Patient tolerated T11 and T12 kyphoplasty well.  However, patient developed severe back pain a few days after the procedure.  MRI of the spine showed new L1 and L2 compression fractures.  He underwent L1 and L2 kyphoplasty under general anesthesia on 07/11/2021.  He comes today for a follow-up post kyphoplasty.    Patient has been recently admitted to [facility] with pneumonia but has since recovered.  He currently has no complaints of back pain.   Past Medical History:  Diagnosis Date   COPD (chronic obstructive pulmonary disease) ([place])    COVID-19    03-2020   Dyspnea    with exertion   HLD (hyperlipidemia)    Hypertension    Oxygen dependent    2L via [place]   Pneumonia    Rheumatoid arthritis ([place])     Past Surgical History:  Procedure Laterality Date   FOOT SURGERY     left   HERNIA REPAIR     IR KYPHO EA ADDL LEVEL THORACIC OR LUMBAR  06/17/2021   IR KYPHO EA ADDL LEVEL THORACIC OR LUMBAR  07/12/2021   IR KYPHO LUMBAR INC FX REDUCE BONE BX UNI/BIL CANNULATION INC/IMAGING  07/11/2021   IR KYPHO THORACIC WITH BONE BIOPSY  06/17/2021   IR RADIOLOGIST EVAL & MGMT  05/18/2021   MULTIPLE TOOTH EXTRACTIONS     ALL TEETH EXT, DENTURES   RADIOLOGY WITH ANESTHESIA N/A 06/17/2021   Procedure: KYPHOPLASTY;  Surgeon: Pedro Earls, MD;  Location: [place];  Service: Radiology;  Laterality: N/A;   RADIOLOGY WITH ANESTHESIA N/A 07/11/2021   Procedure: KYPHOPLASTY;  Surgeon: Pedro Earls,  MD;  Location: [place];  Service: Radiology;  Laterality: N/A;   SPLENECTOMY     TOOTH EXTRACTION     TOTAL HIP ARTHROPLASTY Right 08/17/2020   Procedure: TOTAL HIP ARTHROPLASTY ANTERIOR APPROACH;  Surgeon: Paralee Cancel, MD;  Location: [facility];  Service: Orthopedics;  Laterality: Right;  70 min    Allergies: Patient has no known allergies.  Medications: Prior to Admission medications   Medication Sig Start Date End Date Taking? Authorizing Provider  acetaminophen (TYLENOL) 500 MG tablet Take 500 mg by mouth every 6 (six) hours as needed.    [provider]  albuterol (VENTOLIN HFA) 108 (90 Base) MCG/ACT inhaler Inhale 2 puffs into the lungs every 4 (four) hours as needed (Breathing). 04/15/21   [provider]  aspirin 81 MG EC tablet Take 81 mg by mouth every morning.    [provider]  atorvastatin (LIPITOR) 10 MG tablet Take 10 mg by mouth every morning.    [provider]  budesonide (PULMICORT) 0.5 MG/2ML nebulizer solution Take 2 mLs (0.5 mg total) by nebulization 2 (two) times daily. Patient taking differently: Take 0.5 mg by nebulization 3 (three) times daily. 06/07/21   Freddi Starr, MD  cyanocobalamin (,VITAMIN B-12,) 1000 MCG/ML injection Inject 1,000 mcg into the muscle every 30 (thirty) days.    [provider]  folic acid (FOLVITE) 1 MG tablet Take 1 mg by mouth every  morning.    [provider]  ipratropium-albuterol (DUONEB) 0.5-2.5 (3) MG/3ML SOLN Take 3 mLs by nebulization 3 (three) times daily.    [provider]  lisinopril-hydrochlorothiazide (ZESTORETIC) 20-12.5 MG tablet Take 1 tablet by mouth daily. Patient not taking: Reported on 07/28/2021    [provider]  methotrexate (RHEUMATREX) 2.5 MG tablet Take 10 mg by mouth every Tuesday. Caution:Chemotherapy. Protect from light.    [provider]  oxyCODONE (OXY IR/ROXICODONE) 5 MG immediate release tablet Take 1-2 tablets (5-10 mg total)  by mouth every 6 (six) hours as needed for breakthrough pain or severe pain. Patient not taking: Reported on 07/28/2021 07/02/21 08/31/21  [name], PA-C  OXYGEN Inhale 2 L into the lungs continuous.    [provider]  senna-docusate (SENOKOT-S) 8.6-50 MG tablet Take 1 tablet by mouth 2 (two) times daily as needed for moderate constipation. 05/13/21   [name], DO  Skin Protectants, Misc. (EUCERIN) cream Apply 1 application. topically daily.    [provider]  Vitamin D, Ergocalciferol, (DRISDOL) 1.25 MG (50000 UNIT) CAPS capsule Take 50,000 Units by mouth every Monday.    [provider]     Family History  Problem Relation Age of Onset   Heart attack Brother     Social History   Socioeconomic History   Marital status: Married    Spouse name: Corden Lay   Number of children: 2   Years of education: Not on file   Highest education level: Not on file  Occupational History   Not on file  Tobacco Use   Smoking status: Former    Packs/day: 0.50    Years: 40.00    Pack years: 20.00    Types: Cigarettes    Quit date: 06/28/2021    Years since quitting: 0.0   Smokeless tobacco: Never  Vaping Use   Vaping Use: Never used  Substance and Sexual Activity   Alcohol use: Not Currently    Comment: rare   Drug use: Never   Sexual activity: Not Currently  Other Topics Concern   Not on file  Social History Narrative   Not on file   Social Determinants of Health   Financial Resource Strain: Low Risk    Difficulty of Paying Living Expenses: Not hard at all  Food Insecurity: No Food Insecurity   Worried About Charity fundraiser in the Last Year: Never true   [place] in the Last Year: Never true  Transportation Needs: No Transportation Needs   Lack of Transportation (Medical): No   Lack of Transportation (Non-Medical): No  Physical Activity: Inactive   Days of Exercise per Week: 0 days   Minutes of Exercise per Session: 0 min   Stress: No Stress Concern Present   Feeling of Stress : Not at all  Social Connections: Unknown   Frequency of Communication with Friends and Family: More than three times a week   Frequency of Social Gatherings with Friends and Family: More than three times a week   Attends Religious Services: Patient refused   Active Member of Clubs or Organizations: No   Attends Archivist Meetings: Never   Marital Status: Married     Vital Signs: There were no vitals taken for this visit.  Physical Exam Musculoskeletal:     Comments: Healed incisions in the lower thoracic/upper lumbar region. No pain or tenderness to palpation.  Neurological:     Mental Status: He is alert and  oriented to person, place, and time.    Imaging: No results found.  Labs:  CBC: Recent Labs    05/12/21 0504 06/17/21 0649 07/01/21 1359 07/11/21 0852  WBC 12.1* 12.6* 31.1* 19.3*  HGB 12.9* 14.4 14.1 14.5  HCT 40.6 45.1 44.3 45.5  PLT 372 327 428* 339    COAGS: Recent Labs    08/17/20 1228 05/06/21 0503 06/17/21 0654 07/11/21 0852  INR 1.1 1.0 1.0 0.9  APTT 28  --   --   --     BMP: Recent Labs    05/09/21 0509 05/12/21 0504 06/17/21 0649 07/01/21 1359  NA 132* 131* 136 138  K 4.0 3.6 4.8 3.4*  CL 98 98 102 99  CO2 27 24 26 30   GLUCOSE 111* 122* 104* 87  BUN 14 12 9 21   CALCIUM 8.9 8.9 9.0 9.1  CREATININE 1.08 0.86 0.75 0.74  GFRNONAA >60 >60 >60 >60    LIVER FUNCTION TESTS: Recent Labs    08/17/20 1228 05/04/21 1100 05/05/21 0515 05/06/21 0503 05/07/21 0545 05/08/21 0516 05/09/21 0509  BILITOT 0.7 0.8 0.6  --   --   --   --   AST 23 24 19   --   --   --   --   ALT 18 28 21   --   --   --   --   ALKPHOS 78 142* 108  --   --   --   --   PROT 6.5 8.3* 7.1  --   --   --   --   ALBUMIN 3.0* 3.8 3.0* 3.1* 2.9* 3.0* 3.0*    Assessment:  Nathan Nicholson has done very well after kyphoplasty without residual back pain.  Skin incision are healed.  He was instructed to  follow-up with his primary care provider with regards to bone density and treatment for possible osteoporosis.  He was also encouraged to maintain a healthy lifestyle.   Signed: Pedro Earls, MD 07/28/2021, 1:57 PM    I spent a total of    15 Minutes in face to face in clinical consultation, greater than 50% of which was counseling/coordinating care for vertebral compression fractures.

## 2021-07-29 NOTE — Progress Notes (Signed)
CXR showed no acute findings. Scarring/atelectasis left lung base. No focal pneumonia

## 2021-08-03 ENCOUNTER — Other Ambulatory Visit: Payer: Self-pay | Admitting: *Deleted

## 2021-08-03 ENCOUNTER — Telehealth: Payer: Self-pay | Admitting: Primary Care

## 2021-08-03 DIAGNOSIS — J449 Chronic obstructive pulmonary disease, unspecified: Secondary | ICD-10-CM

## 2021-08-03 DIAGNOSIS — J9611 Chronic respiratory failure with hypoxia: Secondary | ICD-10-CM

## 2021-08-03 NOTE — Patient Outreach (Signed)
[facility]) Care Management  08/03/2021  ERACLIO LORD [birth year] [id number]  Telephone outreach unsuccessful, left a message and requested a return call.  Zara Council. Burgess Estelle, MSN, [facility] Gerontological Nurse Practitioner [facility] Care Management [phone]

## 2021-08-04 ENCOUNTER — Telehealth: Payer: Self-pay | Admitting: Primary Care

## 2021-08-04 ENCOUNTER — Other Ambulatory Visit: Payer: Self-pay | Admitting: *Deleted

## 2021-08-04 DIAGNOSIS — M810 Age-related osteoporosis without current pathological fracture: Secondary | ICD-10-CM | POA: Diagnosis not present

## 2021-08-04 DIAGNOSIS — M8589 Other specified disorders of bone density and structure, multiple sites: Secondary | ICD-10-CM | POA: Diagnosis not present

## 2021-08-04 DIAGNOSIS — J441 Chronic obstructive pulmonary disease with (acute) exacerbation: Secondary | ICD-10-CM

## 2021-08-04 NOTE — Patient Outreach (Signed)
[facility]) Care Management Geriatric Nurse Practitioner Note   08/04/2021 Name:  Nathan Nicholson MRN:  [id number] DOB:  1955/07/22  Summary: Stable COPD and pain is managed.  Recommendations/Changes made from today's visit: Follow COPD Action plan and things we discussed today in care plan.  Subjective: Nathan Nicholson is an 66 y.o. year old male who is a primary patient of Earlyne Iba, NP. The care management team was consulted for assistance with care management and/or care coordination needs.    Geriatric Nurse Practitioner completed Telephone Visit today.   Medications Reviewed Today     Reviewed by Nolon Stalls, Kimber Relic, RN (Registered Nurse) on 07/28/21 at [place] List Status: <None>   Medication Order Taking? Sig Documenting Provider Last Dose Status Informant  acetaminophen (TYLENOL) 500 MG tablet [id number] Yes Take 500 mg by mouth every 6 (six) hours as needed. [provider] Taking Active Spouse/Significant Other  albuterol (VENTOLIN HFA) 108 (90 Base) MCG/ACT inhaler [id number] Yes Inhale 2 puffs into the lungs every 4 (four) hours as needed (Breathing). [provider] Taking Active Spouse/Significant Other  aspirin 81 MG EC tablet [id number] Yes Take 81 mg by mouth every morning. [provider] Taking Active Spouse/Significant Other  atorvastatin (LIPITOR) 10 MG tablet [id number] Yes Take 10 mg by mouth every morning. [provider] Taking Active Spouse/Significant Other  budesonide (PULMICORT) 0.5 MG/2ML nebulizer solution [id number] Yes Take 2 mLs (0.5 mg total) by nebulization 2 (two) times daily.  Patient taking differently: Take 0.5 mg by nebulization 3 (three) times daily.   Freddi Starr, MD Taking Active Spouse/Significant Other  cyanocobalamin (,VITAMIN B-12,) 1000 MCG/ML injection [id number] Yes Inject 1,000 mcg into the muscle every 30 (thirty) days. [provider] Taking Active  Spouse/Significant Other  folic acid (FOLVITE) 1 MG tablet [id number] Yes Take 1 mg by mouth every morning. [provider] Taking Active Spouse/Significant Other  ipratropium-albuterol (DUONEB) 0.5-2.5 (3) MG/3ML SOLN [id number] Yes Take 3 mLs by nebulization 3 (three) times daily. [provider] Taking Active Spouse/Significant Other  lisinopril-hydrochlorothiazide (ZESTORETIC) 20-12.5 MG tablet [id number] No Take 1 tablet by mouth daily.  Patient not taking: Reported on 07/28/2021   [provider] Not Taking Active Spouse/Significant Other           Med Note [name] Jul 21, 2021  5:10 PM) Holding due to hypotension.  methotrexate (RHEUMATREX) 2.5 MG tablet [id number] Yes Take 10 mg by mouth every Tuesday. Caution:Chemotherapy. Protect from light. [provider] Taking Active Spouse/Significant Other           Med Note [name] Jul 21, 2021  5:11 PM) Will start next Tuesday and only on Tuesdays.  oxyCODONE (OXY IR/ROXICODONE) 5 MG immediate release tablet [id number] No Take 1-2 tablets (5-10 mg total) by mouth every 6 (six) hours as needed for breakthrough pain or severe pain.  Patient not taking: Reported on 07/28/2021   [name], PA-C Not Taking Active Spouse/Significant Other  OXYGEN [id number] Yes Inhale 2 L into the lungs continuous. [provider] Taking Active Spouse/Significant Other  senna-docusate (SENOKOT-S) 8.6-50 MG tablet [id number] Yes Take 1 tablet by mouth 2 (two) times daily as needed for moderate constipation. [name], DO Taking Active Spouse/Significant Other  Skin Protectants, Misc. (EUCERIN) cream [id number] Yes Apply 1 application. topically daily. [provider] Taking Active Spouse/Significant Other  Vitamin D, Ergocalciferol, (DRISDOL) 1.25 MG (50000 UNIT) CAPS capsule [id number]  Yes Take 50,000 Units by mouth every Monday. [provider] Taking Active  Spouse/Significant Other            Care Plan  Review of patient past medical history, allergies, medications, health status, including review of consultants reports, laboratory and other test data, was performed as part of comprehensive evaluation for care management services.   Update 08/04/21:  (Status: Goal on Track (progressing): YES.) Short Term Goal  Evaluation of current treatment plan related to PAIN CONTROL and patient's adherence to plan as established by provider Pt reports back pain today is 4/10. He hasn't taken any APAP today but states he probably will soon. The level has been staying under 5/10. He continues to limit his movements. He would like to start being able to do some things in the yard. NP messaged Dr. Debbrah Alar for specific instructions (pt is 3 1/2 weeks post op). Dr. Debbrah Alar responded to just be mindful that heavy lifting and other strenuous activities may put him at risk for other fractures. He should seek advice from his primary care for further management.  Update 08/04/21 (Status: Goal on Track (progressing): YES.) Long Term Goal  Evaluation of current treatment plan related to FOLLOWING COPD ACTION PLAN and patient's adherence to plan as established by provider Pt reports he has received the COPD ACTION PLAN and knows when to call the MD or NP for advice. He has also received the pulse oximetry monitor [employer] ordered for him. He has been using it. His pulmonologist is also in the process of ordering him a compact oxygen machine to reduce the burden of toting O2 tanks.  Also he says when he gets hot and stuffy in the house he will go ride around in the car. We discussed the need to monitor the weather for poor quality: yellow, orange and red. Avoid going outside when it is in the orange or red zone. He says he will start paying attention to that.    Plan: Telephone follow up appointment with care management team member scheduled for:  one month. Pt agrees to the care  plan.  [name]. [name], MSN, [facility] Gerontological Nurse Practitioner [facility] Care Management [phone]

## 2021-08-05 MED ORDER — IPRATROPIUM-ALBUTEROL 0.5-2.5 (3) MG/3ML IN SOLN: 3.0000 mL | Freq: Three times a day (TID) | RESPIRATORY_TRACT | 3 refills | Status: DC

## 2021-08-05 MED ORDER — BUDESONIDE 0.5 MG/2ML IN SUSP: 0.5000 mg | Freq: Two times a day (BID) | RESPIRATORY_TRACT | 11 refills | Status: DC

## 2021-08-05 NOTE — Telephone Encounter (Signed)
ATC wife about patient needing refills. LMTCB Refills have been sent to [facility]. Please let them know when they call back. Nothing further needed at this time.

## 2021-08-05 NOTE — Telephone Encounter (Signed)
O2 Flow Rate (L/min) 3 L/min      Type Pulse       Tech Comments: walked with a walker and POC at a slow pace and finger probe.  Lowest sat was 88%, placed on 2L pulsed, sats dropped to 86%, increased to 3L pulsed and sats up to 93%.  Tolerated walk well.  No stops for rest.   Called and spoke with Ashly with [employer] and he states that the order just needs to say 3L pulsed on POC without the other stuff for insurance purposes. New order has been placed. Nothing further needed at this time.

## 2021-08-07 DIAGNOSIS — J441 Chronic obstructive pulmonary disease with (acute) exacerbation: Secondary | ICD-10-CM | POA: Diagnosis not present

## 2021-08-07 DIAGNOSIS — J9601 Acute respiratory failure with hypoxia: Secondary | ICD-10-CM | POA: Diagnosis not present

## 2021-08-10 DIAGNOSIS — J441 Chronic obstructive pulmonary disease with (acute) exacerbation: Secondary | ICD-10-CM | POA: Diagnosis not present

## 2021-08-10 DIAGNOSIS — J449 Chronic obstructive pulmonary disease, unspecified: Secondary | ICD-10-CM | POA: Diagnosis not present

## 2021-08-12 ENCOUNTER — Telehealth: Payer: Self-pay | Admitting: Primary Care

## 2021-08-12 DIAGNOSIS — J9611 Chronic respiratory failure with hypoxia: Secondary | ICD-10-CM

## 2021-08-12 NOTE — Telephone Encounter (Signed)
Called to inform patient that new order was being placed for new nebulizer machine since his broke. I also informed him that the POC order was placed on 08/08/2021. I advised patient that it would take a few days for him to get the POC. Patient verbalized understanding. Both orders are being sent to [facility]. Nothing further needed

## 2021-08-17 ENCOUNTER — Telehealth: Payer: Self-pay | Admitting: Primary Care

## 2021-08-17 NOTE — Telephone Encounter (Signed)
Called [employer] and spoke to [place]ley and she stated it was in the process right now as we spoke on the phone.   Called and spoke to [place] to inform her of the order for the neb machine. Nothing further needed

## 2021-08-19 DIAGNOSIS — J449 Chronic obstructive pulmonary disease, unspecified: Secondary | ICD-10-CM | POA: Diagnosis not present

## 2021-08-24 ENCOUNTER — Telehealth: Payer: Self-pay | Admitting: Pulmonary Disease

## 2021-08-24 NOTE — Telephone Encounter (Signed)
Lm for patient's spouse, Kathy (DPR). 

## 2021-08-24 NOTE — Telephone Encounter (Signed)
Patient's wife states they have not heard from [employer] regarding POC and patient needs it as soon as possible. Patient's wife did inquire about buying a machine out right instead.  Please advise. Call back [phone].

## 2021-08-25 NOTE — Telephone Encounter (Signed)
Spoke with [employer] who stated for some reason they did not have the POC order but while on the phone [employer] located order on Epic. [employer] representative stated she would personally reach out to pt and update. ATC Olegario Messier LVMTCB

## 2021-08-30 DIAGNOSIS — R0602 Shortness of breath: Secondary | ICD-10-CM | POA: Diagnosis not present

## 2021-08-30 DIAGNOSIS — Z7982 Long term (current) use of aspirin: Secondary | ICD-10-CM | POA: Diagnosis not present

## 2021-08-30 DIAGNOSIS — R059 Cough, unspecified: Secondary | ICD-10-CM | POA: Diagnosis not present

## 2021-08-30 DIAGNOSIS — E7800 Pure hypercholesterolemia, unspecified: Secondary | ICD-10-CM | POA: Diagnosis not present

## 2021-08-30 DIAGNOSIS — S22080A Wedge compression fracture of T11-T12 vertebra, initial encounter for closed fracture: Secondary | ICD-10-CM | POA: Diagnosis not present

## 2021-08-30 DIAGNOSIS — I10 Essential (primary) hypertension: Secondary | ICD-10-CM | POA: Diagnosis not present

## 2021-08-30 DIAGNOSIS — R69 Illness, unspecified: Secondary | ICD-10-CM | POA: Diagnosis not present

## 2021-08-30 DIAGNOSIS — M069 Rheumatoid arthritis, unspecified: Secondary | ICD-10-CM | POA: Diagnosis not present

## 2021-08-30 DIAGNOSIS — J441 Chronic obstructive pulmonary disease with (acute) exacerbation: Secondary | ICD-10-CM | POA: Diagnosis not present

## 2021-08-30 DIAGNOSIS — Z20822 Contact with and (suspected) exposure to covid-19: Secondary | ICD-10-CM | POA: Diagnosis not present

## 2021-08-30 DIAGNOSIS — J449 Chronic obstructive pulmonary disease, unspecified: Secondary | ICD-10-CM | POA: Diagnosis not present

## 2021-08-30 DIAGNOSIS — Z9981 Dependence on supplemental oxygen: Secondary | ICD-10-CM | POA: Diagnosis not present

## 2021-08-30 DIAGNOSIS — J9611 Chronic respiratory failure with hypoxia: Secondary | ICD-10-CM | POA: Diagnosis not present

## 2021-08-30 DIAGNOSIS — J9621 Acute and chronic respiratory failure with hypoxia: Secondary | ICD-10-CM | POA: Diagnosis not present

## 2021-08-30 DIAGNOSIS — K219 Gastro-esophageal reflux disease without esophagitis: Secondary | ICD-10-CM | POA: Diagnosis not present

## 2021-08-30 DIAGNOSIS — J40 Bronchitis, not specified as acute or chronic: Secondary | ICD-10-CM | POA: Diagnosis not present

## 2021-08-30 DIAGNOSIS — M1990 Unspecified osteoarthritis, unspecified site: Secondary | ICD-10-CM | POA: Diagnosis not present

## 2021-08-30 NOTE — Telephone Encounter (Signed)
Spoke to patient's spouse, Katy(DPR). She stated that [employer] contacted yesterday and stated that POC order is currently being processed.  Nothing further needed.

## 2021-08-31 DIAGNOSIS — J9621 Acute and chronic respiratory failure with hypoxia: Secondary | ICD-10-CM | POA: Diagnosis not present

## 2021-08-31 DIAGNOSIS — I10 Essential (primary) hypertension: Secondary | ICD-10-CM | POA: Diagnosis not present

## 2021-08-31 DIAGNOSIS — J441 Chronic obstructive pulmonary disease with (acute) exacerbation: Secondary | ICD-10-CM | POA: Diagnosis not present

## 2021-09-01 DIAGNOSIS — F419 Anxiety disorder, unspecified: Secondary | ICD-10-CM | POA: Diagnosis not present

## 2021-09-01 DIAGNOSIS — R69 Illness, unspecified: Secondary | ICD-10-CM | POA: Diagnosis not present

## 2021-09-01 DIAGNOSIS — D649 Anemia, unspecified: Secondary | ICD-10-CM | POA: Diagnosis not present

## 2021-09-01 DIAGNOSIS — Z6833 Body mass index (BMI) 33.0-33.9, adult: Secondary | ICD-10-CM | POA: Diagnosis not present

## 2021-09-06 DIAGNOSIS — J441 Chronic obstructive pulmonary disease with (acute) exacerbation: Secondary | ICD-10-CM | POA: Diagnosis not present

## 2021-09-06 DIAGNOSIS — J9601 Acute respiratory failure with hypoxia: Secondary | ICD-10-CM | POA: Diagnosis not present

## 2021-09-08 ENCOUNTER — Other Ambulatory Visit: Payer: Self-pay | Admitting: *Deleted

## 2021-09-08 ENCOUNTER — Other Ambulatory Visit (HOSPITAL_COMMUNITY): Payer: Self-pay | Admitting: Neuroradiology

## 2021-09-08 DIAGNOSIS — M546 Pain in thoracic spine: Secondary | ICD-10-CM

## 2021-09-08 NOTE — Patient Outreach (Signed)
[facility]) Care Management Geriatric Nurse Practitioner Note   09/08/2021 Name:  Nathan Nicholson MRN:  [id number] DOB:  August 19, 1955  Summary: Worsening back pain, scheduled consult with Dr. Virgil Benedict for re-evaluation of his spine post kyphoplasty and recent MRI that indicated possible new fx.  Recommendations/Changes made from today's visit: Attend appt.  Subjective: Nathan Nicholson is an 66 y.o. year old male who is a primary patient of [name], NP. The care management team was consulted for assistance with care management and/or care coordination needs.    Geriatric Nurse Practitioner completed Telephone Visit today.   Outpatient Encounter Medications as of 09/08/2021  Medication Sig Note   escitalopram (LEXAPRO) 10 MG tablet Take 10 mg by mouth daily.    acetaminophen (TYLENOL) 500 MG tablet Take 500 mg by mouth every 6 (six) hours as needed.    albuterol (VENTOLIN HFA) 108 (90 Base) MCG/ACT inhaler Inhale 2 puffs into the lungs every 4 (four) hours as needed (Breathing).    aspirin 81 MG EC tablet Take 81 mg by mouth every morning.    atorvastatin (LIPITOR) 10 MG tablet Take 10 mg by mouth every morning.    budesonide (PULMICORT) 0.5 MG/2ML nebulizer solution Take 2 mLs (0.5 mg total) by nebulization 2 (two) times daily.    cyanocobalamin (,VITAMIN B-12,) 1000 MCG/ML injection Inject 1,000 mcg into the muscle every 30 (thirty) days.    folic acid (FOLVITE) 1 MG tablet Take 1 mg by mouth every morning.    ipratropium-albuterol (DUONEB) 0.5-2.5 (3) MG/3ML SOLN Take 3 mLs by nebulization 3 (three) times daily.    lisinopril-hydrochlorothiazide (ZESTORETIC) 20-12.5 MG tablet Take 1 tablet by mouth daily. (Patient not taking: Reported on 07/28/2021) 07/21/2021: Holding due to hypotension.   methotrexate (RHEUMATREX) 2.5 MG tablet Take 10 mg by mouth every Tuesday. Caution:Chemotherapy. Protect from light. 07/21/2021: Will start next Tuesday and only on Tuesdays.    OXYGEN Inhale 2 L into the lungs continuous.    senna-docusate (SENOKOT-S) 8.6-50 MG tablet Take 1 tablet by mouth 2 (two) times daily as needed for moderate constipation.    Skin Protectants, Misc. (EUCERIN) cream Apply 1 application. topically daily.    Vitamin D, Ergocalciferol, (DRISDOL) 1.25 MG (50000 UNIT) CAPS capsule Take 50,000 Units by mouth every Monday.    No facility-administered encounter medications on file as of 09/08/2021.   Care Plan  Review of patient past medical history, allergies, medications, health status, including review of consultants reports, laboratory and other test data, was performed as part of comprehensive evaluation for care management services.   Patient will have reduced pain <5/10 per pt report on each call during the next 30 days. Update 09/08/21:  (Status: Goal on track: NO.) Short Term Goal  Evaluation of current treatment plan related to BACK PAIN  and patient's adherence to plan as established by provider Pt reports his back pain is 7/10, it has gotten worse, he cannot sleep in bed and has to sit up to get any sleep. He also cannot walk without assistance. He is using a wheelchair that he is using as a walker in the house. His walker is in the car. He is waiting to be scheduled to have Dr. Quay Burow evaluated him again for another possible procedure.        NP has sent a message to Dr. Virgil Benedict asking for assistance. Dr, Quay Burow answered and requested her team to get pt scheduled. Received message pt IS SCHEDULED for consult tomorrow!  Patient  will follow COPD ACTION PLAN and report YELLOW ZONE to provider over the next 3 months.  Update 09/08/21:  (Status: Goal on track: NO.) Long Term Goal  Evaluation of current treatment plan related to FOLLOWING COPD ACTION PLAN and patient's adherence to plan as established by provider Mr. Hirt reports he had to go to the hospital [facility]) because he was having difficulty breathing. He was treated and released for  respiratory failure on 6/27-6/28/23. He has his new [employer] oxygen portable machine now, just got it yesterday. Reinforced COPD Action Plan to call MD early for sxs to avoid hospitalization.     Plan: Telephone follow up appointment with care management team member scheduled for:  next week.  Zara Council. Burgess Estelle, MSN, [facility] Gerontological Nurse Practitioner [facility] Care Management [phone]

## 2021-09-09 ENCOUNTER — Other Ambulatory Visit (HOSPITAL_COMMUNITY): Payer: Self-pay | Admitting: Neuroradiology

## 2021-09-09 ENCOUNTER — Ambulatory Visit (HOSPITAL_COMMUNITY)
Admission: RE | Admit: 2021-09-09 | Discharge: 2021-09-09 | Disposition: A | Discharge status: Home / Self Care | Payer: Medicare HMO | Source: Ambulatory Visit | Attending: Neuroradiology | Admitting: Neuroradiology

## 2021-09-09 DIAGNOSIS — M8000XA Age-related osteoporosis with current pathological fracture, unspecified site, initial encounter for fracture: Secondary | ICD-10-CM

## 2021-09-09 DIAGNOSIS — M546 Pain in thoracic spine: Secondary | ICD-10-CM

## 2021-09-09 DIAGNOSIS — M8088XA Other osteoporosis with current pathological fracture, vertebra(e), initial encounter for fracture: Secondary | ICD-10-CM | POA: Diagnosis not present

## 2021-09-09 NOTE — Consult Note (Signed)
Chief Complaint: Patient was seen in consultation today for back pain.  Referring Physician(s): Noralyn Pick C. 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 He tolerated the procedure well with great response with regards to pain. However, approximately 1 week ago he presented to the emergency room at [facility] due to COPD exarcebation with shortness of breath and acute worsening of back pain. CT of the chest performed on 08/30/2021 showed worsening of pre existing compression fracture at T10 with new compression fracture at T9. He states pain is moderate and worse in the lower back. He has needed help with ambulation and use of wheelchair for longer distances.  Past Medical History:  Diagnosis Date   COPD (chronic obstructive pulmonary disease) (HCC)    COVID-19    03-2020   Dyspnea    with exertion   HLD (hyperlipidemia)    Hypertension    Oxygen dependent    2L via [place]   Pneumonia    Rheumatoid arthritis (HCC)     Past Surgical History:  Procedure Laterality Date   FOOT SURGERY     left   HERNIA REPAIR     IR KYPHO EA ADDL LEVEL THORACIC OR LUMBAR  06/17/2021   IR KYPHO EA ADDL LEVEL THORACIC OR LUMBAR  07/12/2021   IR KYPHO LUMBAR INC FX REDUCE BONE BX UNI/BIL CANNULATION INC/IMAGING  07/11/2021   IR KYPHO THORACIC WITH BONE BIOPSY  06/17/2021   IR RADIOLOGIST EVAL & MGMT  05/18/2021   MULTIPLE TOOTH EXTRACTIONS     ALL TEETH EXT, DENTURES   RADIOLOGY WITH ANESTHESIA N/A 06/17/2021   Procedure: KYPHOPLASTY;  Surgeon: Baldemar Lenis, MD;  Location: [facility] OR;  Service:  Radiology;  Laterality: N/A;   RADIOLOGY WITH ANESTHESIA N/A 07/11/2021   Procedure: KYPHOPLASTY;  Surgeon: Baldemar Lenis, MD;  Location: [facility] OR;  Service: Radiology;  Laterality: N/A;   SPLENECTOMY     TOOTH EXTRACTION     TOTAL HIP ARTHROPLASTY Right 08/17/2020   Procedure: TOTAL HIP ARTHROPLASTY ANTERIOR APPROACH;  Surgeon: Durene Romans, MD;  Location: [facility] ORS;  Service: Orthopedics;  Laterality: Right;  70 min    Allergies: Patient has no known allergies.  Medications: Prior to Admission medications   Medication Sig Start Date End Date Taking? Authorizing Provider  acetaminophen (TYLENOL) 500 MG tablet Take 500 mg by mouth every 6 (six) hours as needed.    [provider]  albuterol (VENTOLIN HFA) 108 (90 Base) MCG/ACT inhaler Inhale 2 puffs into the lungs every 4 (four) hours as needed (Breathing). 04/15/21   [provider]  aspirin 81 MG EC tablet Take 81 mg by mouth every morning.    [provider]  atorvastatin (LIPITOR) 10 MG tablet Take 10 mg by mouth every morning.    [provider]  budesonide (PULMICORT) 0.5 MG/2ML nebulizer solution Take 2 mLs (0.5 mg total) by nebulization 2 (two) times daily. 08/05/21   Glenford Bayley, NP  cyanocobalamin (,VITAMIN B-12,) 1000 MCG/ML injection Inject 1,000 mcg into the muscle every 30 (thirty) days.    [provider]  escitalopram (LEXAPRO) 10 MG tablet Take 10 mg by mouth  daily.    [provider]  folic acid (FOLVITE) 1 MG tablet Take 1 mg by mouth every morning.    [provider]  ipratropium-albuterol (DUONEB) 0.5-2.5 (3) MG/3ML SOLN Take 3 mLs by nebulization 3 (three) times daily. 08/05/21   Glenford Bayley, NP  lisinopril-hydrochlorothiazide (ZESTORETIC) 20-12.5 MG tablet Take 1 tablet by mouth daily. Patient not taking: Reported on 07/28/2021    [provider]  methotrexate (RHEUMATREX) 2.5 MG tablet Take 10 mg by mouth every Tuesday.  Caution:Chemotherapy. Protect from light.    [provider]  OXYGEN Inhale 2 L into the lungs continuous.    [provider]  senna-docusate (SENOKOT-S) 8.6-50 MG tablet Take 1 tablet by mouth 2 (two) times daily as needed for moderate constipation. 05/13/21   [name], DO  Skin Protectants, Misc. (EUCERIN) cream Apply 1 application. topically daily.    [provider]  Vitamin D, Ergocalciferol, (DRISDOL) 1.25 MG (50000 UNIT) CAPS capsule Take 50,000 Units by mouth every Monday.    [provider]     Family History  Problem Relation Age of Onset   Heart attack Brother     Social History   Socioeconomic History   Marital status: Married    Spouse name: Edon Hoadley   Number of children: 2   Years of education: Not on file   Highest education level: Not on file  Occupational History   Not on file  Tobacco Use   Smoking status: Former    Packs/day: 0.50    Years: 40.00    Total pack years: 20.00    Types: Cigarettes    Quit date: 06/28/2021    Years since quitting: 0.2   Smokeless tobacco: Never  Vaping Use   Vaping Use: Never used  Substance and Sexual Activity   Alcohol use: Not Currently    Comment: rare   Drug use: Never   Sexual activity: Not Currently  Other Topics Concern   Not on file  Social History Narrative   Not on file   Social Determinants of Health   Financial Resource Strain: Low Risk  (07/27/2021)   Overall Financial Resource Strain (CARDIA)    Difficulty of Paying Living Expenses: Not hard at all  Food Insecurity: No Food Insecurity (07/21/2021)   Hunger Vital Sign    Worried About Running Out of Food in the Last Year: Never true    Ran Out of Food in the Last Year: Never true  Transportation Needs: No Transportation Needs (07/21/2021)   PRAPARE - Administrator, Civil Service (Medical): No    Lack of Transportation (Non-Medical): No  Physical Activity: Inactive (07/27/2021)   Exercise Vital Sign     Days of Exercise per Week: 0 days    Minutes of Exercise per Session: 0 min  Stress: No Stress Concern Present (07/27/2021)   [employer] of Occupational Health - Occupational Stress Questionnaire    Feeling of Stress : Not at all  Social Connections: Unknown (07/27/2021)   Social Connection and Isolation Panel [NHANES]    Frequency of Communication with Friends and Family: More than three times a week    Frequency of Social Gatherings with Friends and Family: More than three times a week    Attends Religious Services: Patient refused    Active Member of Clubs or Organizations: No    Attends Banker Meetings: Never    Marital Status: Married     Review of Systems: A  12 point ROS discussed and pertinent positives are indicated in the HPI above.  All other systems are negative.  Review of Systems  Vital Signs: There were no vitals taken for this visit.  Physical Exam Musculoskeletal:       Back:  Neurological:     Mental Status: He is alert and oriented to person, place, and time. Mental status is at baseline.          Imaging: CLINICAL DATA: COPD shortness of breath cough  EXAM: CT ANGIOGRAPHY CHEST WITH CONTRAST  TECHNIQUE: Multidetector CT imaging of the chest was performed using the standard protocol during bolus administration of intravenous contrast. Multiplanar CT image reconstructions and MIPs were obtained to evaluate the vascular anatomy.  RADIATION DOSE REDUCTION: This exam was performed according to the departmental dose-optimization program which includes automated exposure control, adjustment of the mA and/or kV according to patient size and/or use of iterative reconstruction technique.  CONTRAST: 80 mL Isovue 370 intravenous  COMPARISON: Chest x-ray 08/30/2021, CT chest 07/15/2021  FINDINGS: Cardiovascular: Satisfactory opacification of the pulmonary arteries to the segmental level. Slightly limited by motion degradation.  No definite acute pulmonary embolus is seen. Moderate aortic atherosclerosis without aneurysm or dissection. Coronary vascular calcification. Cardiac size within normal limits. No pericardial effusion  Mediastinum/Nodes: Midline trachea. No thyroid mass. No suspicious lymph nodes. Esophagus within normal limits.  Lungs/Pleura: Small left-sided pleural effusion. Mild bilateral bronchial wall thickening. Emphysema. Partial consolidation within the left lung base could be due to atelectasis versus mild pneumonia.  Upper Abdomen: No acute abnormality.  Musculoskeletal: Treated compression deformities T11 through T12. Possible acute to subacute mild superior endplate fracture at T9 without significant loss of vertebral body height. Acute to subacute appearing fracture involving the anterior aspect of T10 vertebral body with moderate compression, close to 40% loss of height of the vertebral body, increased compared to the prior CT.  Review of the MIP images confirms the above findings.  IMPRESSION: 1. Slightly limited by motion. No definite acute pulmonary embolus is seen. Two small left-sided pleural effusion with partial consolidation at the left base which may be due to atelectasis or. There is emphysema with bilateral bronchial wall thickening. 2. Treated compression deformity T11 through T12. Possible acute to subacute compression fractures at T9-T10.  Aortic Atherosclerosis (ICD10-I70.0) and Emphysema (ICD10-J43.9).   Electronically Signed By: Jasmine Pang M.D. On: 08/30/2021 19:12   Labs:  CBC: Recent Labs    05/12/21 0504 06/17/21 0649 07/01/21 1359 07/11/21 0852  WBC 12.1* 12.6* 31.1* 19.3*  HGB 12.9* 14.4 14.1 14.5  HCT 40.6 45.1 44.3 45.5  PLT 372 327 428* 339    COAGS: Recent Labs    05/06/21 0503 06/17/21 0654 07/11/21 0852  INR 1.0 1.0 0.9    BMP: Recent Labs    05/09/21 0509 05/12/21 0504 06/17/21 0649 07/01/21 1359  NA 132* 131* 136 138   K 4.0 3.6 4.8 3.4*  CL 98 98 102 99  CO2 27 24 26 30   GLUCOSE 111* 122* 104* 87  BUN 14 12 9 21   CALCIUM 8.9 8.9 9.0 9.1  CREATININE 1.08 0.86 0.75 0.74  GFRNONAA >60 >60 >60 >60    LIVER FUNCTION TESTS: Recent Labs    05/04/21 1100 05/05/21 0515 05/06/21 0503 05/07/21 0545 05/08/21 0516 05/09/21 0509  BILITOT 0.8 0.6  --   --   --   --   AST 24 19  --   --   --   --   ALT 28  21  --   --   --   --   ALKPHOS 142* 108  --   --   --   --   PROT 8.3* 7.1  --   --   --   --   ALBUMIN 3.8 3.0* 3.1* 2.9* 3.0* 3.0*    TUMOR MARKERS: No results for input(s): "AFPTM", "CEA", "CA199", "CHROMGRNA" in the last 8760 hours.  Assessment and Plan:  Mr. Staples returns with back pain and new thoracic fractures seen on Chest CT. Although he does have thoracic back pain, the pain is more severe in the lower back. Given his high risk for osteoporotic fracture, I will order an MRI of the lumbar spine to check for new lumbar fracture, pending insurance authorization. Decision on intervention will be made once MRI results are available.  Thank you for this interesting consult.  I greatly enjoyed meeting MAKIAH CLAUSON and look forward to participating in their care.  A copy of this report was sent to the requesting provider on this date.  Electronically Signed: Baldemar Lenis, MD 09/09/2021, 4:04 PM   I spent a total of   15 Minutes in face to face in clinical consultation, greater than 50% of which was counseling/coordinating care for back pain.

## 2021-09-12 DIAGNOSIS — D72829 Elevated white blood cell count, unspecified: Secondary | ICD-10-CM | POA: Diagnosis not present

## 2021-09-14 ENCOUNTER — Telehealth (HOSPITAL_COMMUNITY): Payer: Self-pay | Admitting: Student

## 2021-09-14 DIAGNOSIS — J449 Chronic obstructive pulmonary disease, unspecified: Secondary | ICD-10-CM | POA: Diagnosis not present

## 2021-09-14 DIAGNOSIS — J441 Chronic obstructive pulmonary disease with (acute) exacerbation: Secondary | ICD-10-CM | POA: Diagnosis not present

## 2021-09-14 MED ORDER — CLONAZEPAM 1 MG PO TABS: 1.0000 mg | ORAL_TABLET | Freq: Once | ORAL | 0 refills | Status: DC

## 2021-09-14 NOTE — Telephone Encounter (Signed)
Patient scheduled for MRI 09/19/21 and requires anti-anxiety medication to tolerate MRI. 1  mg tablet Klonopin x 2 [facility] [place]. Patient will take one tablet one hour prior to MRI and a second tablet just before his MRI if needed. Information discussed with the patient's wife who verbalized understanding.  Alwyn Ren, [place] [phone] 09/14/2021, 8:59 AM

## 2021-09-15 ENCOUNTER — Ambulatory Visit: Payer: Self-pay | Admitting: *Deleted

## 2021-09-15 NOTE — Patient Outreach (Signed)
  Care Coordination   Follow Up Visit Note   09/15/2021 Name: Nathan Nicholson MRN: [id number] DOB: 1955-10-12  Nathan Nicholson is a 66 y.o. year old male who sees Potts, Johnney Killian, NP for primary care. I spoke with  Nathan Nicholson by phone today  What matters to the patients health and wellness today?  REDUCING BACK PAIN.   Goals Addressed             This Visit's Progress    Get back pain relief.          SDOH assessments and interventions completed:   Yes, previously addressed.  Care Coordination Interventions Activated:  Yes - arranged for pain management consult last week, pt scheduled for MRI next week and possible procedure (kyphoplasty).  Care Coordination Interventions:   Yes, provided Support offered. Verified knowledge of appt.  Follow up plan: Follow up call scheduled for 2 weeks.  Encounter Outcome:  Pt. Visit Completed  Noralyn Pick C. Burgess Estelle, MSN, [facility] Gerontological Nurse Practitioner [facility] Care Management [phone]

## 2021-09-18 ENCOUNTER — Ambulatory Visit (HOSPITAL_COMMUNITY)
Admission: RE | Admit: 2021-09-18 | Discharge: 2021-09-18 | Disposition: A | Discharge status: Home / Self Care | Payer: Medicare HMO | Source: Ambulatory Visit | Attending: Neuroradiology | Admitting: Neuroradiology

## 2021-09-18 DIAGNOSIS — M8000XA Age-related osteoporosis with current pathological fracture, unspecified site, initial encounter for fracture: Secondary | ICD-10-CM | POA: Insufficient documentation

## 2021-09-18 DIAGNOSIS — M545 Low back pain, unspecified: Secondary | ICD-10-CM | POA: Diagnosis not present

## 2021-09-18 DIAGNOSIS — M810 Age-related osteoporosis without current pathological fracture: Secondary | ICD-10-CM | POA: Diagnosis not present

## 2021-09-18 DIAGNOSIS — J449 Chronic obstructive pulmonary disease, unspecified: Secondary | ICD-10-CM | POA: Diagnosis not present

## 2021-09-19 ENCOUNTER — Encounter (HOSPITAL_COMMUNITY): Payer: Self-pay

## 2021-09-19 ENCOUNTER — Ambulatory Visit (HOSPITAL_COMMUNITY): Payer: Medicare HMO

## 2021-09-20 ENCOUNTER — Telehealth (HOSPITAL_COMMUNITY): Payer: Self-pay

## 2021-09-20 ENCOUNTER — Other Ambulatory Visit (HOSPITAL_COMMUNITY): Payer: Self-pay | Admitting: Neuroradiology

## 2021-09-20 DIAGNOSIS — S32030A Wedge compression fracture of third lumbar vertebra, initial encounter for closed fracture: Secondary | ICD-10-CM

## 2021-09-20 NOTE — Telephone Encounter (Signed)
-----   Message from Baldemar Lenis, MD sent at 09/19/2021  4:06 PM EDT ----- Gavin Pound,  MRI confirmed he has a new L3 fracture. Could you please try to get insurance authorization for kyphoplasty?  Thanks, KdMR   ----- Message ----- From: Interface, Rad Results In Sent: 09/19/2021   3:19 PM EDT To: Baldemar Lenis, MD

## 2021-09-28 DIAGNOSIS — D72829 Elevated white blood cell count, unspecified: Secondary | ICD-10-CM | POA: Diagnosis not present

## 2021-09-29 ENCOUNTER — Ambulatory Visit: Payer: Self-pay | Admitting: *Deleted

## 2021-09-29 NOTE — Patient Outreach (Signed)
  Care Coordination   Follow Up Visit Note   09/29/2021 Name: Nathan Nicholson MRN: [id number] DOB: May 01, 1955  Nathan Nicholson is a 66 y.o. year old male who sees Potts, Johnney Killian, NP for primary care. I spoke with  Nathan Nicholson by phone today  What matters to the patients health and wellness today?  Managing back pain   Goals Addressed             This Visit's Progress    Get back pain relief.       Pt was denied further IR intervention for his compression fxs. He is adopting to this chronic issue and takes APAP when needed although it doesn't help much. Pt encouraged to use his lidoderm patches when he knows he will be up and about during the day for pain relief.        SDOH assessments and interventions completed:   Yes, previously assessed.  Care Coordination Interventions Activated:  Yes Care Coordination Interventions:  Yes, provided, see goal assessment.  Follow up plan: Follow up call scheduled for 1 month.  Encounter Outcome:  Pt. Visit Completed  Noralyn Pick C. Burgess Estelle, MSN, [facility] Gerontological Nurse Practitioner [facility] Care Management [phone]

## 2021-10-03 ENCOUNTER — Encounter (HOSPITAL_COMMUNITY): Payer: Self-pay | Admitting: Orthopedic Surgery

## 2021-10-03 ENCOUNTER — Other Ambulatory Visit: Payer: Self-pay

## 2021-10-03 NOTE — Pre-Procedure Instructions (Signed)
[id number]ita Levan, [place] - [address] [place] [address] Phone: [phone] Fax: [phone]  [facility] - [place], [place] - [address] [employer]. [address] [place] [address] Phone: [phone] Fax: [phone]   PCP - Wenda Low, NP Cardiologist - Jake Bathe, MD  Chest x-ray - 07/28/21 EKG - 07/12/21 ECHO - 08/18/20  Aspirin Instructions: last dose 10/03/21   Anesthesia review: Y  Patient verbally denies any shortness of breath, fever, cough and chest pain during phone call   -------------  SDW INSTRUCTIONS given:  Your procedure is scheduled on Wednesday, 10/05/21.  Report to [facility] Main Entrance "A" at 0600 A.M., and check in at the Admitting office.  Call this number if you have problems the morning of surgery:  [phone]   Remember:  Do not eat or drink after midnight the night before your surgery      Take these medicines the morning of surgery with A SIP OF WATER  atorvastatin (LIPITOR)  budesonide (PULMICORT) ipratropium-albuterol (DUONEB)   acetaminophen (TYLENOL)-if needed albuterol (VENTOLIN HFA)-if needed  As of today, STOP taking any Aspirin (unless otherwise instructed by your surgeon) Aleve, Naproxen, Ibuprofen, Motrin, Advil, Goody's, BC's, all herbal medications, fish oil, and all vitamins.                      Do not wear jewelry, make up, or nail polish            Do not wear lotions, powders, perfumes/colognes, or deodorant.            Do not shave 48 hours prior to surgery.  Men may shave face and neck.            Do not bring valuables to the hospital.            [facility] is not responsible for any belongings or valuables.  Do NOT Smoke (Tobacco/Vaping) 24 hours prior to your procedure If you use a CPAP at night, you may bring all equipment for your overnight stay.   Contacts, glasses, dentures or bridgework may not be worn into  surgery.      For patients admitted to the hospital, discharge time will be determined by your treatment team.   Patients discharged the day of surgery will not be allowed to drive home, and someone needs to stay with them for 24 hours.    Special instructions:   [facility]- Preparing For Surgery  Before surgery, you can play an important role. Because skin is not sterile, your skin needs to be as free of germs as possible. You can reduce the number of germs on your skin by washing with CHG (chlorahexidine gluconate) Soap before surgery.  CHG is an antiseptic cleaner which kills germs and bonds with the skin to continue killing germs even after washing.    Oral Hygiene is also important to reduce your risk of infection.  Remember - BRUSH YOUR TEETH THE MORNING OF SURGERY WITH YOUR REGULAR TOOTHPASTE  Please do not use if you have an allergy to CHG or antibacterial soaps. If your skin becomes reddened/irritated stop using the CHG.  Do not shave (including legs and underarms) for at least 48 hours prior to first CHG shower. It is OK to shave your face.  Please follow these instructions carefully.   Shower the NIGHT BEFORE SURGERY and the MORNING OF SURGERY with DIAL Soap.  Pat yourself dry with a CLEAN TOWEL.  Wear CLEAN PAJAMAS to bed the night before surgery  Place CLEAN SHEETS on your bed the night of your first shower and DO NOT SLEEP WITH PETS.   Day of Surgery: Please shower morning of surgery  Wear Clean/Comfortable clothing the morning of surgery Do not apply any deodorants/lotions.   Remember to brush your teeth WITH YOUR REGULAR TOOTHPASTE.   Questions were answered. Patient verbalized understanding of instructions.

## 2021-10-04 ENCOUNTER — Other Ambulatory Visit: Payer: Self-pay | Admitting: Student

## 2021-10-04 ENCOUNTER — Other Ambulatory Visit: Payer: Self-pay | Admitting: Internal Medicine

## 2021-10-04 ENCOUNTER — Encounter (HOSPITAL_COMMUNITY): Payer: Self-pay | Admitting: Vascular Surgery

## 2021-10-04 DIAGNOSIS — S32020A Wedge compression fracture of second lumbar vertebra, initial encounter for closed fracture: Secondary | ICD-10-CM

## 2021-10-04 NOTE — Anesthesia Preprocedure Evaluation (Addendum)
Anesthesia Evaluation  Patient identified by MRN, date of birth, ID band Patient awake    Reviewed: Allergy & Precautions, NPO status , Patient's Chart, lab work & pertinent test results  History of Anesthesia Complications Negative for: history of anesthetic complications  Airway Mallampati: III  TM Distance: >3 FB Neck ROM: Full    Dental  (+) Edentulous Lower, Edentulous Upper   Pulmonary COPD (2-3L [place]),  COPD inhaler and oxygen dependent, Patient abstained from smoking., former smoker,    Pulmonary exam normal        Cardiovascular hypertension, Normal cardiovascular exam     Neuro/Psych negative neurological ROS  negative psych ROS   GI/Hepatic negative GI ROS, Neg liver ROS,   Endo/Other  diabetes, Type 2  Renal/GU negative Renal ROS  negative genitourinary   Musculoskeletal  (+) Arthritis , Rheumatoid disorders,  s/p kyphoplasty T11 and T12 on 06/17/21, L1 and L2 on 07/11/21   Abdominal   Peds  Hematology negative hematology ROS (+)   Anesthesia Other Findings Day of surgery medications reviewed with patient.  Reproductive/Obstetrics negative OB ROS                          Anesthesia Physical Anesthesia Plan  ASA: 3  Anesthesia Plan: General   Post-op Pain Management: Tylenol PO (pre-op)*   Induction: Intravenous  PONV Risk Score and Plan: 2 and Treatment may vary due to age or medical condition, Dexamethasone, Ondansetron and Midazolam  Airway Management Planned: Oral ETT  Additional Equipment: None  Intra-op Plan:   Post-operative Plan: Extubation in OR  Informed Consent: I have reviewed the patients History and Physical, chart, labs and discussed the procedure including the risks, benefits and alternatives for the proposed anesthesia with the patient or authorized representative who has indicated his/her understanding and acceptance.     Dental advisory given  Plan  Discussed with: CRNA  Anesthesia Plan Comments: (See PAT note written 10/04/2021 by Shonna Chock, PA-C. )      Anesthesia Quick Evaluation

## 2021-10-04 NOTE — Progress Notes (Signed)
Anesthesia Chart Review: SAME DAY WORK-UP  Case: [id number] Date/Time: 10/05/21 0815   Procedure: L3 Kyphoplasty   Anesthesia type: General   Pre-op diagnosis: Closed compression fracture of L3 vertebra, initial encounter (HCC) [C12.751Z (ICD-10-CM)]   [facility] / [facility]   Surgeons: de Glori Luis, MD       DISCUSSION: Patient is a 66 year old male scheduled for the above procedure. He is s/p kyphoplasty T11 and T12 on 06/17/21, L1 and L2 on 07/11/21, both under GETA. Prior to scheduling his April 2023 kyphoplasty, he had preoperative cardiology and pulmonology evaluations (see below).   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   Mr. Overfelt was evaluated by cardiologist Dr. 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 He did not show for his 10/21/20 follow-up, but had preoperative evaluation on 06/01/21 by Edd Fabian, NP.  He wrote, " Given past medical history and time since last visit, based on ACC/AHA guidelines, ANTINIO SANDERFER would be at acceptable risk for the planned procedure without further cardiovascular testing...   His RCRI is a class II risk, 0.9% risk of major cardiac event.  His Duke activity status index score is 7.2."    Last pulmonology visit 07/28/21 with Ames Dura, NP. He reported hospitalization at [facility] 07/16/21-07/22/21 for PNA. Had baseline wheezing. Follow-up CXR showed left basilar scarring/atelectasis. Continue Pulmicort, DuoNeb, and added prednisone taper. He  qualified for POC -- needs 3L O2 with exertion. Of note, I had communicated with Dr. Francine Graven following patient's 06/07/21 evaluation and preoperative risk assessment. Patient has very frequent COPD exacerbations and had been classified as "low to moderate risk for post-operative pulmonary complications based on ARISCAT risk index, this is a 1.6 to 13.3% chance of post-operatively pulmonary complications, as defined by the occurrence of respiratory failure, respiratory infection, pleural effusion, atelectasis, pneumothorax, bronchospasm or aspiration pneumonitis". Dr. Francine Graven recently treated him for a COPD exacerbation but had suggested that surgery soon after treatment may be necessary given patient's non-compliance and risk for recurrent exacerbation. He could provide risk stratification, but it deferred to the anesthesiologist's assessment on the day of surgery whether or not to proceed.   Recent MRI on 09/18/21 confirmed a acute to subacute fracture L3 new since 07/01/21. IR has recommended another kyphoplasty proceduree. He is a same day work-up. Staff to contact him today with instructions. Anesthesia team to evaluate on the day of surgery.     VS: Ht 5\' 3"  (1.6 m)   Wt 86.2 kg   BMI 33.66 kg/m  BP Readings from Last 3 Encounters:  07/28/21 126/86  07/11/21 119/85  07/01/21 (!) 147/92   Pulse Readings from Last 3 Encounters:  07/28/21 (!) 103  07/11/21 76  07/01/21 (!) 130     PROVIDERS: 07/03/21, NP is PCP  [name], MD is HEM-ONC. Last visit 06/09/21. 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 08/09/21, MD is pulmonologist Melody Comas, MD is cardiologist Donato Schultz, MD is rheumatologist   LABS: For day of surgery as indicated. Last results include: Lab Results  Component Value Date  WBC 19.3 (H) 07/11/2021   HGB 14.5 07/11/2021   HCT 45.5 07/11/2021   PLT 339 07/11/2021   GLUCOSE 87 07/01/2021   ALT 21 05/05/2021   AST 19  05/05/2021   NA 138 07/01/2021   K 3.4 (L) 07/01/2021   CL 99 07/01/2021   CREATININE 0.74 07/01/2021   BUN 21 07/01/2021   CO2 30 07/01/2021   TSH 2.286 05/04/2021   INR 0.9 07/11/2021   HGBA1C 5.9 (H) 05/04/2021    IMAGES: MRI L-spine 09/18/21: IMPRESSION: 1. Acute to subacute compression fracture of the inferior L3 endplate is new since 07/01/2021. There is no bony retropulsion or extension into the posterior elements. 2. Compression fractures of the T11 through L2 vertebral bodies are unchanged with no worsening height loss or bony retropulsion. Vertebral augmentation changes are noted at these levels, new at L1 and L2 since 2020.  CR 07/28/21: FINDINGS: Stable cardiac and mediastinal contours. Minimal linear opacities left lung base. No large area pulmonary consolidation. No pleural effusion or pneumothorax. Kyphoplasty material thoracic spine. IMPRESSION: Left basilar scarring/atelectasis.      EKG:  EKG 07/11/21: Sinus tachycardia with frequent Premature ventricular complexes Otherwise normal ECG Confirmed by Lennie Odor [phone]) on 07/11/2021 12:02:12 PM  EKG 06/01/21: ST with fusion complexes at 109 bpm     CV: Echo 08/18/20: IMPRESSIONS   1. Left ventricular ejection fraction, by estimation, is 60 to 65%. The  left ventricle has normal function. The left ventricle has no regional  wall motion abnormalities. Left ventricular diastolic parameters are  consistent with Grade I diastolic  dysfunction (impaired relaxation).   2. Right ventricular systolic function is normal. The right ventricular  size is mildly enlarged. Tricuspid regurgitation signal is inadequate for  assessing PA pressure.   3. The mitral valve is grossly normal. No evidence of mitral valve  regurgitation. No evidence of mitral stenosis.   4. The aortic valve is tricuspid. Aortic valve regurgitation is not  visualized. No aortic stenosis is present.   5. The inferior vena cava is normal in  size with greater than 50%  respiratory variability, suggesting right atrial pressure of 3 mmHg.  - Comparison(s): No prior Echocardiogram.     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 Daisy Floro by hematologist Dr. Truett Perna   Oxygen dependent    2L via [place]   Pneumonia    Rheumatoid arthritis [facility])     Past Surgical History:  Procedure Laterality Date   FOOT SURGERY     left   HERNIA REPAIR     IR KYPHO EA ADDL LEVEL THORACIC OR LUMBAR  06/17/2021   IR KYPHO EA ADDL LEVEL THORACIC OR LUMBAR  07/12/2021   IR KYPHO LUMBAR INC FX REDUCE BONE BX UNI/BIL CANNULATION INC/IMAGING  07/11/2021   IR KYPHO THORACIC WITH BONE BIOPSY  06/17/2021   IR RADIOLOGIST EVAL & MGMT  05/18/2021   MULTIPLE TOOTH EXTRACTIONS     ALL TEETH EXT, DENTURES   RADIOLOGY WITH ANESTHESIA N/A 06/17/2021   Procedure: KYPHOPLASTY;  Surgeon: Baldemar Lenis, MD;  Location: [facility] OR;  Service: Radiology;  Laterality: N/A;   RADIOLOGY WITH ANESTHESIA N/A 07/11/2021   Procedure: KYPHOPLASTY;  Surgeon: Tommie Sams,  Erven Colla, MD;  Location: [place];  Service: Radiology;  Laterality: N/A;   SPLENECTOMY     TOOTH EXTRACTION     TOTAL HIP ARTHROPLASTY Right 08/17/2020   Procedure: TOTAL HIP ARTHROPLASTY ANTERIOR APPROACH;  Surgeon: Paralee Cancel, MD;  Location: [facility] ORS;  Service: Orthopedics;  Laterality: Right;  70 min    MEDICATIONS: No current facility-administered medications for this encounter.    acetaminophen (TYLENOL) 500 MG tablet   albuterol (VENTOLIN HFA) 108 (90 Base) MCG/ACT inhaler   alendronate (FOSAMAX) 70 MG tablet   aspirin 81 MG EC tablet   atorvastatin (LIPITOR) 10 MG tablet   budesonide (PULMICORT) 0.5 MG/2ML nebulizer solution   cyanocobalamin  (,VITAMIN B-12,) 1000 MCG/ML injection   docusate sodium (COLACE) 100 MG capsule   escitalopram (LEXAPRO) 10 MG tablet   ferrous sulfate 325 (65 FE) MG tablet   folic acid (FOLVITE) 1 MG tablet   ipratropium-albuterol (DUONEB) 0.5-2.5 (3) MG/3ML SOLN   meloxicam (MOBIC) 15 MG tablet   methotrexate (RHEUMATREX) 2.5 MG tablet   OXYGEN   senna-docusate (SENOKOT-S) 8.6-50 MG tablet   Skin Protectants, Misc. (EUCERIN) cream   Vitamin D, Ergocalciferol, (DRISDOL) 1.25 MG (50000 UNIT) CAPS capsule  ASA instructions per IR.   Myra Gianotti, PA-C Surgical Short Stay/Anesthesiology [facility] Phone [phone] [facility] Phone [phone] 10/04/2021 11:53 AM

## 2021-10-05 ENCOUNTER — Encounter (HOSPITAL_COMMUNITY)
Admission: RE | Disposition: A | Discharge status: Home / Self Care | Payer: Self-pay | Source: Ambulatory Visit | Attending: Neuroradiology

## 2021-10-05 ENCOUNTER — Other Ambulatory Visit: Payer: Self-pay | Admitting: Primary Care

## 2021-10-05 ENCOUNTER — Ambulatory Visit (HOSPITAL_COMMUNITY)
Admission: RE | Admit: 2021-10-05 | Discharge: 2021-10-05 | Disposition: A | Discharge status: Home / Self Care | Payer: Medicare HMO | Source: Ambulatory Visit | Attending: Neuroradiology | Admitting: Neuroradiology

## 2021-10-05 ENCOUNTER — Other Ambulatory Visit: Payer: Self-pay

## 2021-10-05 ENCOUNTER — Ambulatory Visit (HOSPITAL_COMMUNITY): Payer: Medicare HMO | Admitting: Vascular Surgery

## 2021-10-05 ENCOUNTER — Encounter (HOSPITAL_COMMUNITY): Payer: Self-pay

## 2021-10-05 ENCOUNTER — Ambulatory Visit (HOSPITAL_BASED_OUTPATIENT_CLINIC_OR_DEPARTMENT_OTHER): Payer: Medicare HMO | Admitting: Vascular Surgery

## 2021-10-05 DIAGNOSIS — M0500 Felty's syndrome, unspecified site: Secondary | ICD-10-CM | POA: Insufficient documentation

## 2021-10-05 DIAGNOSIS — M8008XA Age-related osteoporosis with current pathological fracture, vertebra(e), initial encounter for fracture: Secondary | ICD-10-CM | POA: Insufficient documentation

## 2021-10-05 DIAGNOSIS — M069 Rheumatoid arthritis, unspecified: Secondary | ICD-10-CM | POA: Insufficient documentation

## 2021-10-05 DIAGNOSIS — Z96641 Presence of right artificial hip joint: Secondary | ICD-10-CM | POA: Diagnosis not present

## 2021-10-05 DIAGNOSIS — Z9081 Acquired absence of spleen: Secondary | ICD-10-CM | POA: Diagnosis not present

## 2021-10-05 DIAGNOSIS — Z9981 Dependence on supplemental oxygen: Secondary | ICD-10-CM | POA: Diagnosis not present

## 2021-10-05 DIAGNOSIS — I10 Essential (primary) hypertension: Secondary | ICD-10-CM

## 2021-10-05 DIAGNOSIS — Z8616 Personal history of COVID-19: Secondary | ICD-10-CM | POA: Insufficient documentation

## 2021-10-05 DIAGNOSIS — Z87891 Personal history of nicotine dependence: Secondary | ICD-10-CM

## 2021-10-05 DIAGNOSIS — J449 Chronic obstructive pulmonary disease, unspecified: Secondary | ICD-10-CM | POA: Diagnosis not present

## 2021-10-05 DIAGNOSIS — J441 Chronic obstructive pulmonary disease with (acute) exacerbation: Secondary | ICD-10-CM

## 2021-10-05 DIAGNOSIS — M4856XA Collapsed vertebra, not elsewhere classified, lumbar region, initial encounter for fracture: Secondary | ICD-10-CM | POA: Diagnosis not present

## 2021-10-05 DIAGNOSIS — S32020A Wedge compression fracture of second lumbar vertebra, initial encounter for closed fracture: Secondary | ICD-10-CM

## 2021-10-05 DIAGNOSIS — Z8781 Personal history of (healed) traumatic fracture: Secondary | ICD-10-CM | POA: Diagnosis not present

## 2021-10-05 DIAGNOSIS — D72829 Elevated white blood cell count, unspecified: Secondary | ICD-10-CM | POA: Insufficient documentation

## 2021-10-05 DIAGNOSIS — S32030A Wedge compression fracture of third lumbar vertebra, initial encounter for closed fracture: Secondary | ICD-10-CM

## 2021-10-05 HISTORY — DX: Elevated white blood cell count, unspecified: D72.829

## 2021-10-05 HISTORY — PX: IR KYPHO LUMBAR INC FX REDUCE BONE BX UNI/BIL CANNULATION INC/IMAGING: IMG5519

## 2021-10-05 HISTORY — PX: RADIOLOGY WITH ANESTHESIA: SHX6223

## 2021-10-05 LAB — CBC WITH DIFFERENTIAL/PLATELET
Abs Immature Granulocytes: 0 10*3/uL (ref 0.00–0.07)
Basophils Absolute: 0.2 10*3/uL — ABNORMAL HIGH (ref 0.0–0.1)
Basophils Relative: 1 %
Eosinophils Absolute: 0.9 10*3/uL — ABNORMAL HIGH (ref 0.0–0.5)
Eosinophils Relative: 5 %
HCT: 44.5 % (ref 39.0–52.0)
Hemoglobin: 14.4 g/dL (ref 13.0–17.0)
Lymphocytes Relative: 27 %
Lymphs Abs: 4.9 10*3/uL — ABNORMAL HIGH (ref 0.7–4.0)
MCH: 29.3 pg (ref 26.0–34.0)
MCHC: 32.4 g/dL (ref 30.0–36.0)
MCV: 90.6 fL (ref 80.0–100.0)
Monocytes Absolute: 1.1 10*3/uL — ABNORMAL HIGH (ref 0.1–1.0)
Monocytes Relative: 6 %
Neutro Abs: 11 10*3/uL — ABNORMAL HIGH (ref 1.7–7.7)
Neutrophils Relative %: 61 %
Platelets: 339 10*3/uL (ref 150–400)
RBC: 4.91 MIL/uL (ref 4.22–5.81)
RDW: 18.6 % — ABNORMAL HIGH (ref 11.5–15.5)
WBC: 18 10*3/uL — ABNORMAL HIGH (ref 4.0–10.5)
nRBC: 0 % (ref 0.0–0.2)
nRBC: 1 /100 WBC — ABNORMAL HIGH

## 2021-10-05 LAB — BASIC METABOLIC PANEL
Anion gap: 8 (ref 5–15)
BUN: 7 mg/dL — ABNORMAL LOW (ref 8–23)
CO2: 29 mmol/L (ref 22–32)
Calcium: 9 mg/dL (ref 8.9–10.3)
Chloride: 101 mmol/L (ref 98–111)
Creatinine, Ser: 0.84 mg/dL (ref 0.61–1.24)
GFR, Estimated: >60 mL/min (ref >60)
Glucose, Bld: 98 mg/dL (ref 70–99)
Potassium: 3.4 mmol/L — ABNORMAL LOW (ref 3.5–5.1)
Sodium: 138 mmol/L (ref 135–145)

## 2021-10-05 LAB — SURGICAL PCR SCREEN
MRSA, PCR: NEGATIVE
Staphylococcus aureus: NEGATIVE

## 2021-10-05 LAB — PROTIME-INR
INR: 1.1 (ref 0.8–1.2)
Prothrombin Time: 13.7 seconds (ref 11.4–15.2)

## 2021-10-05 SURGERY — IR WITH ANESTHESIA
Anesthesia: General

## 2021-10-05 MED ORDER — ROCURONIUM BROMIDE 10 MG/ML (PF) SYRINGE
PREFILLED_SYRINGE | INTRAVENOUS | Status: DC | PRN
  Administered 2021-10-05: 60 mg via INTRAVENOUS

## 2021-10-05 MED ORDER — FENTANYL CITRATE (PF) 100 MCG/2ML IJ SOLN: 25.0000 ug | INTRAMUSCULAR | Status: DC | PRN

## 2021-10-05 MED ORDER — IOHEXOL 300 MG/ML  SOLN
100.0000 mL | Freq: Once | INTRAMUSCULAR | Status: AC | PRN
  Administered 2021-10-05: 15 mL

## 2021-10-05 MED ORDER — CEFAZOLIN SODIUM-DEXTROSE 2-4 GM/100ML-% IV SOLN
2.0000 g | INTRAVENOUS | Status: AC
  Administered 2021-10-05: 2 g via INTRAVENOUS
  Filled 2021-10-05 (×2): qty 100

## 2021-10-05 MED ORDER — CHLORHEXIDINE GLUCONATE 0.12 % MT SOLN
OROMUCOSAL | Status: DC
Start: 2021-10-05 — End: 2021-10-05
  Filled 2021-10-05: qty 15

## 2021-10-05 MED ORDER — FENTANYL CITRATE (PF) 100 MCG/2ML IJ SOLN
INTRAMUSCULAR | Status: DC | PRN
Start: 2021-10-05 — End: 2021-10-05
  Administered 2021-10-05 (×2): 50 ug via INTRAVENOUS

## 2021-10-05 MED ORDER — ONDANSETRON HCL 4 MG/2ML IJ SOLN
INTRAMUSCULAR | Status: DC | PRN
  Administered 2021-10-05: 4 mg via INTRAVENOUS

## 2021-10-05 MED ORDER — SODIUM CHLORIDE 0.9 % IV SOLN: INTRAVENOUS | Status: DC

## 2021-10-05 MED ORDER — CHLORHEXIDINE GLUCONATE 0.12 % MT SOLN
15.0000 mL | Freq: Once | OROMUCOSAL | Status: AC
  Administered 2021-10-05: 15 mL via OROMUCOSAL

## 2021-10-05 MED ORDER — MIDAZOLAM HCL 2 MG/2ML IJ SOLN
INTRAMUSCULAR | Status: DC | PRN
  Administered 2021-10-05 (×2): 1 mg via INTRAVENOUS

## 2021-10-05 MED ORDER — LIDOCAINE 2% (20 MG/ML) 5 ML SYRINGE
INTRAMUSCULAR | Status: DC | PRN
  Administered 2021-10-05: 100 mg via INTRAVENOUS

## 2021-10-05 MED ORDER — DEXAMETHASONE SODIUM PHOSPHATE 10 MG/ML IJ SOLN
INTRAMUSCULAR | Status: DC | PRN
  Administered 2021-10-05: 5 mg via INTRAVENOUS

## 2021-10-05 MED ORDER — SUGAMMADEX SODIUM 200 MG/2ML IV SOLN
INTRAVENOUS | Status: DC | PRN
  Administered 2021-10-05: 200 mg via INTRAVENOUS

## 2021-10-05 MED ORDER — PROPOFOL 10 MG/ML IV BOLUS
INTRAVENOUS | Status: DC | PRN
  Administered 2021-10-05: 160 mg via INTRAVENOUS

## 2021-10-05 MED ORDER — ACETAMINOPHEN 500 MG PO TABS
1000.0000 mg | ORAL_TABLET | Freq: Once | ORAL | Status: AC
  Administered 2021-10-05: 1000 mg via ORAL
  Filled 2021-10-05: qty 2

## 2021-10-05 MED ORDER — OXYCODONE HCL 5 MG/5ML PO SOLN: 5.0000 mg | Freq: Once | ORAL | Status: DC | PRN

## 2021-10-05 MED ORDER — ORAL CARE MOUTH RINSE: 15.0000 mL | Freq: Once | OROMUCOSAL | Status: AC

## 2021-10-05 MED ORDER — AMISULPRIDE (ANTIEMETIC) 5 MG/2ML IV SOLN: 10.0000 mg | Freq: Once | INTRAVENOUS | Status: DC | PRN

## 2021-10-05 MED ORDER — BUPIVACAINE HCL (PF) 0.5 % IJ SOLN
INTRAMUSCULAR | Status: AC
  Filled 2021-10-05: qty 30

## 2021-10-05 MED ORDER — PHENYLEPHRINE 80 MCG/ML (10ML) SYRINGE FOR IV PUSH (FOR BLOOD PRESSURE SUPPORT)
PREFILLED_SYRINGE | INTRAVENOUS | Status: DC | PRN
  Administered 2021-10-05 (×2): 240 ug via INTRAVENOUS
  Administered 2021-10-05: 160 ug via INTRAVENOUS
  Administered 2021-10-05 (×2): 320 ug via INTRAVENOUS
  Administered 2021-10-05 (×2): 160 ug via INTRAVENOUS

## 2021-10-05 MED ORDER — OXYCODONE HCL 5 MG PO TABS: 5.0000 mg | ORAL_TABLET | Freq: Once | ORAL | Status: DC | PRN

## 2021-10-05 MED ORDER — LIDOCAINE HCL (PF) 1 % IJ SOLN
INTRAMUSCULAR | Status: AC
  Filled 2021-10-05: qty 30

## 2021-10-05 MED ORDER — LACTATED RINGERS IV SOLN: INTRAVENOUS | Status: DC

## 2021-10-05 NOTE — Anesthesia Procedure Notes (Signed)
Procedure Name: Intubation Date/Time: 10/05/2021 8:59 AM  Performed by: Barrington Ellison, CRNAPre-anesthesia Checklist: Patient identified, Emergency Drugs available, Suction available and Patient being monitored Patient Re-evaluated:Patient Re-evaluated prior to induction Oxygen Delivery Method: Circle System Utilized Preoxygenation: Pre-oxygenation with 100% oxygen Induction Type: IV induction Ventilation: Mask ventilation without difficulty Laryngoscope Size: Mac and 4 Grade View: Grade I Tube type: Oral Tube size: 7.5 mm Number of attempts: 1 Airway Equipment and Method: Oral airway Placement Confirmation: ETT inserted through vocal cords under direct vision, positive ETCO2 and breath sounds checked- equal and bilateral Secured at: 22 cm Tube secured with: Tape Dental Injury: Teeth and Oropharynx as per pre-operative assessment

## 2021-10-05 NOTE — Sedation Documentation (Signed)
Pt transported to PACU bay 3 via stretcher accompanied by RN. Danielle RN at bedside. Handoff complete. Pt drowsy but easily arousable and responds appropiately. Follows commands. No s/s of distress at this time.

## 2021-10-05 NOTE — Anesthesia Postprocedure Evaluation (Signed)
Anesthesia Post Note  Patient: Nathan Nicholson  Procedure(s) Performed: L3 Kyphoplasty     Patient location during evaluation: PACU Anesthesia Type: General Level of consciousness: awake and alert Pain management: pain level controlled Vital Signs Assessment: post-procedure vital signs reviewed and stable Respiratory status: spontaneous breathing, nonlabored ventilation and respiratory function stable Cardiovascular status: blood pressure returned to baseline Postop Assessment: no apparent nausea or vomiting Anesthetic complications: no   No notable events documented.  Last Vitals:  Vitals:   10/05/21 1045 10/05/21 1100  BP: (!) 153/91 132/85  Pulse: 78 64  Resp: (!) 23 (!) 22  Temp:    SpO2: 94% 95%    Last Pain:  Vitals:   10/05/21 1100  TempSrc:   PainSc: 0-No pain                 Shanda Howells

## 2021-10-05 NOTE — H&P (Signed)
Chief Complaint: Patient was seen in consultation today for back pain  Supervising Physician: Baldemar Lenis  Patient Status: [facility] - Out-pt  History of Present Illness: Nathan Nicholson is a 66 y.o. 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 He tolerated the procedure well with great response with regards to pain.  He is having low back pain and requiring more use of wheelchair.  Imaging demonstrated new thoracic fractures and an acute to subacute fracture at L3.  Decision was made to proceed with L3 kyphoplasty.    He presents today with low back pain.  He had his inhaler in his hand.  He endorses using once already today and required it 4 times yesterday.  He denies HA, dizziness, lightheadedness, blurred vision, trouble swallowing, shortness of breath, palpitations, chest pain, abdominal pain, change in bowel/bladder habits or any swelling on body.  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 Daisy Floro by hematologist Dr. Truett Perna   Oxygen dependent    2L via [place]   Pneumonia    Rheumatoid arthritis [facility])     Past Surgical History:  Procedure Laterality Date   FOOT SURGERY     left   HERNIA REPAIR     IR KYPHO EA ADDL LEVEL THORACIC OR LUMBAR  06/17/2021   IR KYPHO EA ADDL LEVEL THORACIC OR LUMBAR  07/12/2021   IR KYPHO LUMBAR INC FX REDUCE BONE BX UNI/BIL CANNULATION INC/IMAGING  07/11/2021   IR KYPHO THORACIC WITH BONE BIOPSY  06/17/2021   IR RADIOLOGIST EVAL & MGMT  05/18/2021   MULTIPLE TOOTH  EXTRACTIONS     ALL TEETH EXT, DENTURES   RADIOLOGY WITH ANESTHESIA N/A 06/17/2021   Procedure: KYPHOPLASTY;  Surgeon: Baldemar Lenis, MD;  Location: [facility] OR;  Service: Radiology;  Laterality: N/A;   RADIOLOGY WITH ANESTHESIA N/A 07/11/2021   Procedure: KYPHOPLASTY;  Surgeon: Baldemar Lenis, MD;  Location: [facility] OR;  Service: Radiology;  Laterality: N/A;   SPLENECTOMY     TOOTH EXTRACTION     TOTAL HIP ARTHROPLASTY Right 08/17/2020   Procedure: TOTAL HIP ARTHROPLASTY ANTERIOR APPROACH;  Surgeon: Durene Romans, MD;  Location: [facility] ORS;  Service: Orthopedics;  Laterality: Right;  70 min    Allergies: Patient has no known allergies.  Medications: Prior to Admission medications   Medication Sig Start Date End Date Taking? Authorizing Provider  acetaminophen (TYLENOL) 500 MG tablet Take 1,000 mg by mouth every 6 (six) hours as needed for moderate pain.   Yes [provider]  albuterol (VENTOLIN HFA) 108 (90 Base) MCG/ACT inhaler Inhale 2 puffs into the lungs every 4 (four) hours as needed (Breathing). 04/15/21  Yes [provider]  aspirin 81 MG EC tablet Take 81 mg by mouth every morning.   Yes [provider]  atorvastatin (LIPITOR) 10 MG tablet Take 10 mg by mouth every morning.   Yes [provider]  budesonide (PULMICORT) 0.5 MG/2ML nebulizer solution Take 2 mLs (0.5 mg total) by nebulization 2 (two) times daily. 08/05/21  Yes Martyn Ehrich, NP  cyanocobalamin (,VITAMIN B-12,) 1000 MCG/ML injection Inject 1,000 mcg into the muscle every 30 (thirty) days.   Yes [provider]  docusate sodium (COLACE) 100 MG capsule Take 100 mg by mouth daily.   Yes [provider]  escitalopram (LEXAPRO) 10 MG tablet Take 10 mg by mouth at bedtime.   Yes [provider]  ferrous sulfate 325 (65 FE) MG tablet Take 325 mg by mouth daily.   Yes [provider]  folic acid (FOLVITE) 1 MG tablet Take 1 mg by mouth  every morning.   Yes [provider]  ipratropium-albuterol (DUONEB) 0.5-2.5 (3) MG/3ML SOLN Take 3 mLs by nebulization 3 (three) times daily. 08/05/21  Yes Martyn Ehrich, NP  meloxicam (MOBIC) 15 MG tablet Take 15 mg by mouth daily.   Yes [provider]  methotrexate (RHEUMATREX) 2.5 MG tablet Take 10 mg by mouth every Tuesday. Caution:Chemotherapy. Protect from light.   Yes [provider]  OXYGEN Inhale 2 L into the lungs continuous.   Yes [provider]  Skin Protectants, Misc. (EUCERIN) cream Apply 1 application. topically daily.   Yes [provider]  Vitamin D, Ergocalciferol, (DRISDOL) 1.25 MG (50000 UNIT) CAPS capsule Take 50,000 Units by mouth every Monday.   Yes [provider]  alendronate (FOSAMAX) 70 MG tablet Take 70 mg by mouth every Monday. Take with a full glass of water on an empty stomach.    [provider]  senna-docusate (SENOKOT-S) 8.6-50 MG tablet Take 1 tablet by mouth 2 (two) times daily as needed for moderate constipation. Patient not taking: Reported on 09/30/2021 05/13/21   [name], DO     Family History  Problem Relation Age of Onset   Heart attack Brother     Social History   Socioeconomic History   Marital status: Married    Spouse name: Jaevian Serino   Number of children: 2   Years of education: Not on file   Highest education level: Not on file  Occupational History   Not on file  Tobacco Use   Smoking status: Former    Packs/day: 0.50    Years: 40.00    Total pack years: 20.00    Types: Cigarettes    Quit date: 06/28/2021    Years since quitting: 0.2   Smokeless tobacco: Never  Vaping Use   Vaping Use: Never used  Substance and Sexual Activity   Alcohol use: Not Currently    Comment: rare   Drug use: Never   Sexual activity: Not Currently  Other Topics Concern   Not on file  Social History Narrative   Not on file   Social Determinants of Health   Financial  Resource Strain: Low Risk  (07/27/2021)   Overall Financial Resource Strain (CARDIA)    Difficulty of Paying Living Expenses: Not hard at all  Food Insecurity: No Food Insecurity (07/21/2021)   Hunger Vital Sign    Worried About Running Out of Food in the Last Year: Never true    [place] in the Last Year: Never true  Transportation Needs: No Transportation Needs (07/21/2021)   PRAPARE - Hydrologist (Medical): No    Lack of Transportation (Non-Medical): No  Physical Activity: Inactive (07/27/2021)   Exercise Vital Sign    Days of Exercise per Week: 0 days    Minutes of Exercise per Session: 0 min  Stress: No Stress Concern Present (  07/27/2021)   [employer] [employer] - Occupational Stress Questionnaire    Feeling of Stress : Not at all  Social Connections: Unknown (07/27/2021)   Social Connection and Isolation Panel [NHANES]    Frequency of Communication with Friends and Family: More than three times a week    Frequency of Social Gatherings with Friends and Family: More than three times a week    Attends Religious Services: Patient refused    Database administrator or Organizations: No    Attends Banker Meetings: Never    Marital Status: Married    Review of Systems: A 12 point ROS discussed and pertinent positives are indicated in the HPI above.  All other systems are negative.  Vital Signs: BP (!) 150/81   Pulse 82   Temp 98 F (36.7 C) (Oral)   Resp 18   Ht 5\' 3"  (1.6 m)   Wt 191 lb (86.6 kg)   SpO2 97%   BMI 33.83 kg/m    Physical Exam Vitals reviewed.  Constitutional:      General: He is not in acute distress. HENT:     Head: Normocephalic and atraumatic.     Mouth/Throat:     Mouth: Mucous membranes are moist.     Pharynx: Oropharynx is clear.  Eyes:     Extraocular Movements: Extraocular movements intact.  Cardiovascular:     Rate and Rhythm: Normal rate.     Pulses: Normal pulses.   Pulmonary:     Effort: Pulmonary effort is normal.     Breath sounds: Wheezing present.     Comments: Left greater than right Abdominal:     Palpations: Abdomen is soft.  Musculoskeletal:     Right lower leg: No edema.     Left lower leg: No edema.  Skin:    General: Skin is warm and dry.  Neurological:     General: No focal deficit present.     Mental Status: He is alert and oriented to person, place, and time.  Psychiatric:        Mood and Affect: Mood normal.        Behavior: Behavior normal.     Imaging: MR LUMBAR SPINE WO CONTRAST  Result Date: 09/19/2021 CLINICAL DATA:  Osteoporosis with pathologic fracture, lower back pain EXAM: MRI LUMBAR SPINE WITHOUT CONTRAST TECHNIQUE: Multiplanar, multisequence MR imaging of the lumbar spine was performed. No intravenous contrast was administered. COMPARISON:  Lumbar spine MRI 07/02/2018 FINDINGS: Segmentation: Standard; the lowest formed disc space is designated L5-S1. Alignment:  Normal. Vertebrae: Compression deformities of the T11 through L2 vertebral bodies are unchanged since 07/02/2018 worst at T11, T12, and L1. There is no worsening compression deformity of the vertebral bodies. Mild bony retropulsion of the T12 vertebral body is unchanged. Vertebral augmentation changes are noted at these levels, new at L1 and L2 since 07/01/2021. There is new acute to subacute compression deformity of the inferior L3 endplate without bony retropulsion or extension into the posterior elements. Mild indentation of the inferior L4 endplate is similar to the prior study without marrow edema. The L5 vertebral body is normal. Conus medullaris and cauda equina: Conus extends to the L1 level. Conus and cauda equina appear normal. Paraspinal and other soft tissues: Unremarkable. Disc levels: Mild multilevel degenerative changes are stable in the short interim with up to mild bilateral neural foraminal stenosis at L3-L4 and L4-L5. There is no significant spinal  canal stenosis. IMPRESSION: 1. Acute to subacute compression fracture  of the inferior L3 endplate is new since 07/01/2021. There is no bony retropulsion or extension into the posterior elements. 2. Compression fractures of the T11 through L2 vertebral bodies are unchanged with no worsening height loss or bony retropulsion. Vertebral augmentation changes are noted at these levels, new at L1 and L2 since 2020. Electronically Signed   By: Lesia Hausen M.D.   On: 09/19/2021 15:17    Labs:  CBC: Recent Labs    06/17/21 0649 07/01/21 1359 07/11/21 0852 10/05/21 0653  WBC 12.6* 31.1* 19.3* 18.0*  HGB 14.4 14.1 14.5 14.4  HCT 45.1 44.3 45.5 44.5  PLT 327 428* 339 339    COAGS: Recent Labs    05/06/21 0503 06/17/21 0654 07/11/21 0852 10/05/21 0653  INR 1.0 1.0 0.9 1.1    BMP: Recent Labs    05/09/21 0509 05/12/21 0504 06/17/21 0649 07/01/21 1359  NA 132* 131* 136 138  K 4.0 3.6 4.8 3.4*  CL 98 98 102 99  CO2 27 24 26 30   GLUCOSE 111* 122* 104* 87  BUN 14 12 9 21   CALCIUM 8.9 8.9 9.0 9.1  CREATININE 1.08 0.86 0.75 0.74  GFRNONAA >60 >60 >60 >60    LIVER FUNCTION TESTS: Recent Labs    05/04/21 1100 05/05/21 0515 05/06/21 0503 05/07/21 0545 05/08/21 0516 05/09/21 0509  BILITOT 0.8 0.6  --   --   --   --   AST 24 19  --   --   --   --   ALT 28 21  --   --   --   --   ALKPHOS 142* 108  --   --   --   --   PROT 8.3* 7.1  --   --   --   --   ALBUMIN 3.8 3.0* 3.1* 2.9* 3.0* 3.0*     Assessment and Plan:  [name] presents today with L3 fracture causing pain that is debilitating and requiring use of wheelchair.  He has had success with prior KP and wishes to proceed with L3 KP as discussed with Dr. 07/09/21.  This will be alongside anesthesia team as before.  Risks and benefits of L3 kyphoplasty were discussed with the patient including, but not limited to education regarding the natural healing process of compression fractures without  intervention, bleeding, infection, cement migration which may cause spinal cord damage, paralysis, pulmonary embolism or even death.  This interventional procedure involves the use of X-rays and because of the nature of the planned procedure, it is possible that we will have prolonged use of X-ray fluoroscopy.  Potential radiation risks to you include (but are not limited to) the following: - A slightly elevated risk for cancer  several years later in life. This risk is typically less than 0.5% percent. This risk is low in comparison to the normal incidence of human cancer, which is 33% for women and 50% for men according to the American Cancer Society. - Radiation induced injury can include skin redness, resembling a rash, tissue breakdown / ulcers and hair loss (which can be temporary or permanent).   The likelihood of either of these occurring depends on the difficulty of the procedure and whether you are sensitive to radiation due to previous procedures, disease, or genetic conditions.   IF your procedure requires a prolonged use of radiation, you will be notified and given written instructions for further action.  It is your responsibility to monitor the irradiated area for the  2 weeks following the procedure and to notify your physician if you are concerned that you have suffered a radiation induced injury.    All of the patient's questions were answered, patient is agreeable to proceed.  Consent signed and in chart.    Thank you for this interesting consult.  I greatly enjoyed meeting Nathan Nicholson and look forward to participating in their care.  A copy of this report was sent to the requesting provider on this date.  Electronically Signed: Pasty Spillers, PA 10/05/2021, 7:43 AM   I spent a total of 25 Minutes in face to face in clinical consultation, greater than 50% of which was counseling/coordinating care for KP procedure

## 2021-10-05 NOTE — Transfer of Care (Signed)
Immediate Anesthesia Transfer of Care Note  Patient: Nathan Nicholson  Procedure(s) Performed: L3 Kyphoplasty  Patient Location: PACU  Anesthesia Type:General  Level of Consciousness: awake and oriented  Airway & Oxygen Therapy: Patient Spontanous Breathing and Patient connected to nasal cannula oxygen  Post-op Assessment: Report given to RN  Post vital signs: Reviewed and stable  Last Vitals:  Vitals Value Taken Time  BP 152/85 10/05/21 1022  Temp    Pulse 72 10/05/21 1022  Resp 14 10/05/21 1022  SpO2 95 % 10/05/21 1022  Vitals shown include unvalidated device data.  Last Pain:  Vitals:   10/05/21 0707  TempSrc:   PainSc: 5       Patients Stated Pain Goal: 0 (10/05/21 0707)  Complications: No notable events documented.

## 2021-10-05 NOTE — Procedures (Signed)
INTERVENTIONAL NEURORADIOLOGY BRIEF POSTPROCEDURE NOTE  FLUOROSCOPY GUIDED L3 CORE BONE BIOPSY AND BALLOON KYOHOPLASTY  Attending: Dr. Baldemar Lenis  Diagnosis: L3 compression fracture  Access site: Percutaneous bilateral transpedicular  Anesthesia: GETA  Medication used: Refer to anesthesia documentation.  Complications: None.  Estimated blood loss: None.  Specimen: L3 core biopsy  Findings: L3 compression fracture. Fluoroscopy guided core biopsy followed by balloon kyphoplasty.   The patient tolerated the procedure well without incident or complication and is in stable condition.

## 2021-10-06 ENCOUNTER — Encounter (HOSPITAL_COMMUNITY): Payer: Self-pay | Admitting: Neuroradiology

## 2021-10-06 DIAGNOSIS — J441 Chronic obstructive pulmonary disease with (acute) exacerbation: Secondary | ICD-10-CM | POA: Diagnosis not present

## 2021-10-06 DIAGNOSIS — J449 Chronic obstructive pulmonary disease, unspecified: Secondary | ICD-10-CM | POA: Diagnosis not present

## 2021-10-07 DIAGNOSIS — J441 Chronic obstructive pulmonary disease with (acute) exacerbation: Secondary | ICD-10-CM | POA: Diagnosis not present

## 2021-10-07 DIAGNOSIS — J9601 Acute respiratory failure with hypoxia: Secondary | ICD-10-CM | POA: Diagnosis not present

## 2021-10-07 DIAGNOSIS — J449 Chronic obstructive pulmonary disease, unspecified: Secondary | ICD-10-CM | POA: Diagnosis not present

## 2021-10-10 LAB — SURGICAL PATHOLOGY

## 2021-10-11 ENCOUNTER — Other Ambulatory Visit (HOSPITAL_COMMUNITY): Payer: Self-pay | Admitting: Neuroradiology

## 2021-10-11 ENCOUNTER — Telehealth (HOSPITAL_COMMUNITY): Payer: Self-pay

## 2021-10-11 DIAGNOSIS — S32030A Wedge compression fracture of third lumbar vertebra, initial encounter for closed fracture: Secondary | ICD-10-CM

## 2021-10-11 NOTE — Telephone Encounter (Signed)
Called to schedule f/u, no answer, no vm. AW 

## 2021-10-12 ENCOUNTER — Ambulatory Visit (HOSPITAL_COMMUNITY): Admission: RE | Admit: 2021-10-12 | Payer: Medicare HMO | Source: Ambulatory Visit

## 2021-10-12 DIAGNOSIS — Z6834 Body mass index (BMI) 34.0-34.9, adult: Secondary | ICD-10-CM | POA: Diagnosis not present

## 2021-10-12 DIAGNOSIS — M545 Low back pain, unspecified: Secondary | ICD-10-CM | POA: Diagnosis not present

## 2021-10-12 DIAGNOSIS — M8080XD Other osteoporosis with current pathological fracture, unspecified site, subsequent encounter for fracture with routine healing: Secondary | ICD-10-CM | POA: Diagnosis not present

## 2021-10-12 DIAGNOSIS — M1991 Primary osteoarthritis, unspecified site: Secondary | ICD-10-CM | POA: Diagnosis not present

## 2021-10-12 DIAGNOSIS — M0579 Rheumatoid arthritis with rheumatoid factor of multiple sites without organ or systems involvement: Secondary | ICD-10-CM | POA: Diagnosis not present

## 2021-10-12 DIAGNOSIS — E669 Obesity, unspecified: Secondary | ICD-10-CM | POA: Diagnosis not present

## 2021-10-12 DIAGNOSIS — M25561 Pain in right knee: Secondary | ICD-10-CM | POA: Diagnosis not present

## 2021-10-13 ENCOUNTER — Telehealth (HOSPITAL_COMMUNITY): Payer: Self-pay

## 2021-10-13 ENCOUNTER — Ambulatory Visit: Payer: Medicare HMO | Admitting: Oncology

## 2021-10-13 DIAGNOSIS — Z125 Encounter for screening for malignant neoplasm of prostate: Secondary | ICD-10-CM | POA: Diagnosis not present

## 2021-10-13 DIAGNOSIS — I10 Essential (primary) hypertension: Secondary | ICD-10-CM | POA: Diagnosis not present

## 2021-10-13 DIAGNOSIS — E782 Mixed hyperlipidemia: Secondary | ICD-10-CM | POA: Diagnosis not present

## 2021-10-13 DIAGNOSIS — E538 Deficiency of other specified B group vitamins: Secondary | ICD-10-CM | POA: Diagnosis not present

## 2021-10-13 DIAGNOSIS — E559 Vitamin D deficiency, unspecified: Secondary | ICD-10-CM | POA: Diagnosis not present

## 2021-10-13 DIAGNOSIS — E1169 Type 2 diabetes mellitus with other specified complication: Secondary | ICD-10-CM | POA: Diagnosis not present

## 2021-10-13 NOTE — Telephone Encounter (Signed)
Called to reschedule consult, no answer, left vm. AW  

## 2021-10-18 ENCOUNTER — Ambulatory Visit (HOSPITAL_COMMUNITY)
Admission: RE | Admit: 2021-10-18 | Discharge: 2021-10-18 | Disposition: A | Discharge status: Home / Self Care | Payer: Medicare HMO | Source: Ambulatory Visit | Attending: Neuroradiology | Admitting: Neuroradiology

## 2021-10-18 DIAGNOSIS — S32030A Wedge compression fracture of third lumbar vertebra, initial encounter for closed fracture: Secondary | ICD-10-CM

## 2021-10-18 NOTE — Progress Notes (Signed)
Referring Physician(s): de Macedo Rodrigues,Phyllis Abelson  Chief Complaint: The patient is seen in follow up today s/p L3 vertebral body core biopsy and balloon kyphoplasty.  History of present illness:  Nathan Nicholson is a 66 y.o. 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 He tolerated the procedure well with great response with regards to pain.  He developed new low back pain requiring more use of wheelchair.  Imaging demonstrated new thoracic fractures and an acute to subacute fracture at L3.  He underwent  L3 kyphoplasty on 10/05/2021 and returns today for follow-up. He says his pain has resolved after kyphoplasty. No new back pain. No issues with access site.   A core biopsy sample was obtained during the kyphoplasty. Pathologist note: "Overall while the bony changes are consistent with the clinical history  of compression fracture, the hematopoietic marrow is hypercellular for  age and consists of both a myeloid hyperplasia as well as a polyclonal plasmacytosis and lymphocytosis.  These findings are of uncertain clinical significance and while may simply represent a  reactive/reparative response to the compression fracture it is slightly unusual.  Clinical correlation recommended with further workup as clinically indicated".   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 Daisy Floro by hematologist Dr. Truett Perna   Oxygen dependent    2L via [place]   Pneumonia    Rheumatoid arthritis [facility])     Past Surgical History:   Procedure Laterality Date   FOOT SURGERY     left   HERNIA REPAIR     IR KYPHO EA ADDL LEVEL THORACIC OR LUMBAR  06/17/2021   IR KYPHO EA ADDL LEVEL THORACIC OR LUMBAR  07/12/2021   IR KYPHO LUMBAR INC FX REDUCE BONE BX UNI/BIL CANNULATION INC/IMAGING  07/11/2021   IR KYPHO LUMBAR INC FX REDUCE BONE BX UNI/BIL CANNULATION INC/IMAGING  10/05/2021   IR KYPHO THORACIC WITH BONE BIOPSY  06/17/2021   IR RADIOLOGIST EVAL & MGMT  05/18/2021   MULTIPLE TOOTH EXTRACTIONS     ALL TEETH EXT, DENTURES   RADIOLOGY WITH ANESTHESIA N/A 06/17/2021   Procedure: KYPHOPLASTY;  Surgeon: Baldemar Lenis, MD;  Location: [facility] OR;  Service: Radiology;  Laterality: N/A;   RADIOLOGY WITH ANESTHESIA N/A 07/11/2021   Procedure: KYPHOPLASTY;  Surgeon: Baldemar Lenis, MD;  Location[facility] OR;  Service: Radiology;  Laterality: N/A;   RADIOLOGY WITH ANESTHESIA N/A 10/05/2021   Procedure: L3 Kyphoplasty;  Surgeon: Baldemar Lenis, MD;  Location: [facility] OR;  Service: Radiology;  Laterality: N/A;   SPLENECTOMY     TOOTH EXTRACTION     TOTAL HIP ARTHROPLASTY Right 08/17/2020   Procedure: TOTAL HIP ARTHROPLASTY ANTERIOR APPROACH;  Surgeon: Durene Romans, MD;  Location: [facility] ORS;  Service: Orthopedics;  Laterality: Right;  70 min    Allergies: Patient has no known allergies.  Medications: Prior to Admission medications   Medication Sig Start Date End Date Taking? Authorizing Provider  acetaminophen (TYLENOL) 500 MG tablet Take 1,000 mg by mouth every 6 (six) hours as needed for  moderate pain.    [provider]  albuterol (VENTOLIN HFA) 108 (90 Base) MCG/ACT inhaler Inhale 2 puffs into the lungs every 4 (four) hours as needed (Breathing). 04/15/21   [provider]  alendronate (FOSAMAX) 70 MG tablet Take 70 mg by mouth every Monday. Take with a full glass of water on an empty stomach.    [provider]  aspirin 81 MG EC tablet Take 81 mg by mouth every morning.     [provider]  atorvastatin (LIPITOR) 10 MG tablet Take 10 mg by mouth every morning.    [provider]  budesonide (PULMICORT) 0.5 MG/2ML nebulizer solution USE 1 VIAL  IN  NEBULIZER TWICE  DAILY - Rinse Mouth Out After Each Treatment 10/06/21   Glenford Bayley, NP  cyanocobalamin (,VITAMIN B-12,) 1000 MCG/ML injection Inject 1,000 mcg into the muscle every 30 (thirty) days.    [provider]  docusate sodium (COLACE) 100 MG capsule Take 100 mg by mouth daily.    [provider]  escitalopram (LEXAPRO) 10 MG tablet Take 10 mg by mouth at bedtime.    [provider]  ferrous sulfate 325 (65 FE) MG tablet Take 325 mg by mouth daily.    [provider]  folic acid (FOLVITE) 1 MG tablet Take 1 mg by mouth every morning.    [provider]  ipratropium-albuterol (DUONEB) 0.5-2.5 (3) MG/3ML SOLN USE 1 VIAL IN NEBULIZER 3 TIMES DAILY 10/06/21   Glenford Bayley, NP  meloxicam (MOBIC) 15 MG tablet Take 15 mg by mouth daily.    [provider]  methotrexate (RHEUMATREX) 2.5 MG tablet Take 10 mg by mouth every Tuesday. Caution:Chemotherapy. Protect from light.    [provider]  OXYGEN Inhale 2 L into the lungs continuous.    [provider]  senna-docusate (SENOKOT-S) 8.6-50 MG tablet Take 1 tablet by mouth 2 (two) times daily as needed for moderate constipation. Patient not taking: Reported on 09/30/2021 05/13/21   [name], DO  Skin Protectants, Misc. (EUCERIN) cream Apply 1 application. topically daily.    [provider]  Vitamin D, Ergocalciferol, (DRISDOL) 1.25 MG (50000 UNIT) CAPS capsule Take 50,000 Units by mouth every Monday.    [provider]     Family History  Problem Relation Age of Onset   Heart attack Brother     Social History   Socioeconomic History   Marital status: Married    Spouse name: Famous Speller   Number of children: 2   Years of education: Not on file    Highest education level: Not on file  Occupational History   Not on file  Tobacco Use   Smoking status: Former    Packs/day: 0.50    Years: 40.00    Total pack years: 20.00    Types: Cigarettes    Quit date: 06/28/2021    Years since quitting: 0.3   Smokeless tobacco: Never  Vaping Use   Vaping Use: Never used  Substance and Sexual Activity   Alcohol use: Not Currently    Comment: rare   Drug use: Never   Sexual activity: Not Currently  Other Topics Concern   Not on file  Social History Narrative   Not on file   Social Determinants of Health   Financial Resource Strain: Low Risk  (07/27/2021)   Overall Financial Resource Strain (CARDIA)    Difficulty of Paying Living Expenses: Not hard at all  Food Insecurity: No Food  Insecurity (07/21/2021)   Hunger Vital Sign    Worried About Running Out of Food in the Last Year: Never true    Ran Out of Food in the Last Year: Never true  Transportation Needs: No Transportation Needs (07/21/2021)   PRAPARE - Administrator, Civil Service (Medical): No    Lack of Transportation (Non-Medical): No  Physical Activity: Inactive (07/27/2021)   Exercise Vital Sign    Days of Exercise per Week: 0 days    Minutes of Exercise per Session: 0 min  Stress: No Stress Concern Present (07/27/2021)   [employer] of Occupational Health - Occupational Stress Questionnaire    Feeling of Stress : Not at all  Social Connections: Unknown (07/27/2021)   Social Connection and Isolation Panel [NHANES]    Frequency of Communication with Friends and Family: More than three times a week    Frequency of Social Gatherings with Friends and Family: More than three times a week    Attends Religious Services: Patient refused    Active Member of Clubs or Organizations: No    Attends Banker Meetings: Never    Marital Status: Married     Vital Signs: There were no vitals taken for this visit.  Physical Exam Constitutional:       Interventions: Nasal cannula in place.  Musculoskeletal:       Back:  Skin:    General: Skin is warm and dry.  Neurological:     General: No focal deficit present.     Mental Status: He is alert and oriented to person, place, and time.     Imaging: No results found.  Labs:  CBC: Recent Labs    06/17/21 0649 07/01/21 1359 07/11/21 0852 10/05/21 0653  WBC 12.6* 31.1* 19.3* 18.0*  HGB 14.4 14.1 14.5 14.4  HCT 45.1 44.3 45.5 44.5  PLT 327 428* 339 339    COAGS: Recent Labs    05/06/21 0503 06/17/21 0654 07/11/21 0852 10/05/21 0653  INR 1.0 1.0 0.9 1.1    BMP: Recent Labs    05/12/21 0504 06/17/21 0649 07/01/21 1359 10/05/21 0653  NA 131* 136 138 138  K 3.6 4.8 3.4* 3.4*  CL 98 102 99 101  CO2 24 26 30 29   GLUCOSE 122* 104* 87 98  BUN 12 9 21  7*  CALCIUM 8.9 9.0 9.1 9.0  CREATININE 0.86 0.75 0.74 0.84  GFRNONAA >60 >60 >60 >60    LIVER FUNCTION TESTS: Recent Labs    05/04/21 1100 05/05/21 0515 05/06/21 0503 05/07/21 0545 05/08/21 0516 05/09/21 0509  BILITOT 0.8 0.6  --   --   --   --   AST 24 19  --   --   --   --   ALT 28 21  --   --   --   --   ALKPHOS 142* 108  --   --   --   --   PROT 8.3* 7.1  --   --   --   --   ALBUMIN 3.8 3.0* 3.1* 2.9* 3.0* 3.0*    Assessment:  Mr. Word had great treatment response to kyphoplasty for his L3 compression fracture with no signs of complication. Importance of osteoporosis treatment was emphasized. Results of core biopsy collected during procedure were informed to Mr. Desmith and forwarded to Shon Baton, NP who helps coordinate his care. She will reach out to his primary care for further work up if necessary.  Signed: Shon Baton  Melchor Amour, MD 10/18/2021, 5:06 PM    I spent a total of    15 Minutes in face to face in clinical consultation, greater than 50% of which was counseling/coordinating care for L3 compression fracture status post kyphoplasty.

## 2021-10-19 DIAGNOSIS — R0781 Pleurodynia: Secondary | ICD-10-CM | POA: Diagnosis not present

## 2021-10-19 DIAGNOSIS — M0500 Felty's syndrome, unspecified site: Secondary | ICD-10-CM | POA: Diagnosis not present

## 2021-10-19 DIAGNOSIS — I700 Atherosclerosis of aorta: Secondary | ICD-10-CM | POA: Diagnosis not present

## 2021-10-19 DIAGNOSIS — J439 Emphysema, unspecified: Secondary | ICD-10-CM | POA: Diagnosis not present

## 2021-10-19 DIAGNOSIS — J9610 Chronic respiratory failure, unspecified whether with hypoxia or hypercapnia: Secondary | ICD-10-CM | POA: Diagnosis not present

## 2021-10-19 DIAGNOSIS — E1151 Type 2 diabetes mellitus with diabetic peripheral angiopathy without gangrene: Secondary | ICD-10-CM | POA: Diagnosis not present

## 2021-10-19 DIAGNOSIS — Z6833 Body mass index (BMI) 33.0-33.9, adult: Secondary | ICD-10-CM | POA: Diagnosis not present

## 2021-10-19 DIAGNOSIS — M0579 Rheumatoid arthritis with rheumatoid factor of multiple sites without organ or systems involvement: Secondary | ICD-10-CM | POA: Diagnosis not present

## 2021-10-19 DIAGNOSIS — E785 Hyperlipidemia, unspecified: Secondary | ICD-10-CM | POA: Diagnosis not present

## 2021-10-19 DIAGNOSIS — E559 Vitamin D deficiency, unspecified: Secondary | ICD-10-CM | POA: Diagnosis not present

## 2021-10-19 DIAGNOSIS — J449 Chronic obstructive pulmonary disease, unspecified: Secondary | ICD-10-CM | POA: Diagnosis not present

## 2021-10-19 DIAGNOSIS — M069 Rheumatoid arthritis, unspecified: Secondary | ICD-10-CM | POA: Diagnosis not present

## 2021-10-19 DIAGNOSIS — R69 Illness, unspecified: Secondary | ICD-10-CM | POA: Diagnosis not present

## 2021-10-19 DIAGNOSIS — E782 Mixed hyperlipidemia: Secondary | ICD-10-CM | POA: Diagnosis not present

## 2021-10-19 DIAGNOSIS — Z008 Encounter for other general examination: Secondary | ICD-10-CM | POA: Diagnosis not present

## 2021-10-19 DIAGNOSIS — I10 Essential (primary) hypertension: Secondary | ICD-10-CM | POA: Diagnosis not present

## 2021-10-19 DIAGNOSIS — M810 Age-related osteoporosis without current pathological fracture: Secondary | ICD-10-CM | POA: Diagnosis not present

## 2021-10-19 DIAGNOSIS — D529 Folate deficiency anemia, unspecified: Secondary | ICD-10-CM | POA: Diagnosis not present

## 2021-10-19 DIAGNOSIS — E538 Deficiency of other specified B group vitamins: Secondary | ICD-10-CM | POA: Diagnosis not present

## 2021-10-19 DIAGNOSIS — E1169 Type 2 diabetes mellitus with other specified complication: Secondary | ICD-10-CM | POA: Diagnosis not present

## 2021-10-21 DIAGNOSIS — S2242XA Multiple fractures of ribs, left side, initial encounter for closed fracture: Secondary | ICD-10-CM | POA: Diagnosis not present

## 2021-10-21 DIAGNOSIS — I2510 Atherosclerotic heart disease of native coronary artery without angina pectoris: Secondary | ICD-10-CM | POA: Diagnosis not present

## 2021-10-21 DIAGNOSIS — R918 Other nonspecific abnormal finding of lung field: Secondary | ICD-10-CM | POA: Diagnosis not present

## 2021-10-21 DIAGNOSIS — R0781 Pleurodynia: Secondary | ICD-10-CM | POA: Diagnosis not present

## 2021-10-21 DIAGNOSIS — X58XXXA Exposure to other specified factors, initial encounter: Secondary | ICD-10-CM | POA: Diagnosis not present

## 2021-10-21 DIAGNOSIS — I700 Atherosclerosis of aorta: Secondary | ICD-10-CM | POA: Diagnosis not present

## 2021-10-21 DIAGNOSIS — J432 Centrilobular emphysema: Secondary | ICD-10-CM | POA: Diagnosis not present

## 2021-10-31 ENCOUNTER — Encounter: Payer: Medicare HMO | Admitting: *Deleted

## 2021-10-31 DIAGNOSIS — J449 Chronic obstructive pulmonary disease, unspecified: Secondary | ICD-10-CM | POA: Diagnosis not present

## 2021-10-31 DIAGNOSIS — J441 Chronic obstructive pulmonary disease with (acute) exacerbation: Secondary | ICD-10-CM | POA: Diagnosis not present

## 2021-11-01 ENCOUNTER — Ambulatory Visit: Payer: Self-pay | Admitting: *Deleted

## 2021-11-01 ENCOUNTER — Encounter: Payer: Self-pay | Admitting: *Deleted

## 2021-11-01 NOTE — Patient Outreach (Signed)
  Care Coordination   Follow Up Visit Note   11/01/2021 Name: Nathan Nicholson MRN: [id number] DOB: 05-14-1955  Nathan Nicholson is a 66 y.o. year old male who sees Potts, Johnney Killian, NP for primary care. I spoke with  Nathan Nicholson by phone today.  What matters to the patients health and wellness today?  PAIN IS GONE AND NO NEW CONCERNS AT THIS TIME.    Goals Addressed             This Visit's Progress    COMPLETED: Get back pain relief.   On track    Care Coordination Interventions: Evaluation of current treatment plan related to BACK PAIN and patient's adherence to plan as established by provider Reviewed medications with patient and discussed ADHERANCE TO HIS ROUTINE MEDS AND USE OF PRN PAIN MEDICATION.         SDOH assessments and interventions completed:  No     Care Coordination Interventions Activated:  Yes  Care Coordination Interventions:  Yes, provided   Follow up plan: Follow up call scheduled for 3 MONTHS.    Encounter Outcome:  Pt. Visit Completed   Noralyn Pick C. Burgess Estelle, MSN, [facility] Gerontological Nurse Practitioner [facility] Care Management [phone]

## 2021-11-07 DIAGNOSIS — J441 Chronic obstructive pulmonary disease with (acute) exacerbation: Secondary | ICD-10-CM | POA: Diagnosis not present

## 2021-11-07 DIAGNOSIS — J9601 Acute respiratory failure with hypoxia: Secondary | ICD-10-CM | POA: Diagnosis not present

## 2021-11-07 DIAGNOSIS — J449 Chronic obstructive pulmonary disease, unspecified: Secondary | ICD-10-CM | POA: Diagnosis not present

## 2021-11-19 DIAGNOSIS — J449 Chronic obstructive pulmonary disease, unspecified: Secondary | ICD-10-CM | POA: Diagnosis not present

## 2021-11-23 DIAGNOSIS — J441 Chronic obstructive pulmonary disease with (acute) exacerbation: Secondary | ICD-10-CM | POA: Diagnosis not present

## 2021-11-23 DIAGNOSIS — J449 Chronic obstructive pulmonary disease, unspecified: Secondary | ICD-10-CM | POA: Diagnosis not present

## 2021-12-07 DIAGNOSIS — J441 Chronic obstructive pulmonary disease with (acute) exacerbation: Secondary | ICD-10-CM | POA: Diagnosis not present

## 2021-12-07 DIAGNOSIS — H524 Presbyopia: Secondary | ICD-10-CM | POA: Diagnosis not present

## 2021-12-07 DIAGNOSIS — J449 Chronic obstructive pulmonary disease, unspecified: Secondary | ICD-10-CM | POA: Diagnosis not present

## 2021-12-07 DIAGNOSIS — H5213 Myopia, bilateral: Secondary | ICD-10-CM | POA: Diagnosis not present

## 2021-12-07 DIAGNOSIS — J9601 Acute respiratory failure with hypoxia: Secondary | ICD-10-CM | POA: Diagnosis not present

## 2021-12-07 DIAGNOSIS — H52209 Unspecified astigmatism, unspecified eye: Secondary | ICD-10-CM | POA: Diagnosis not present

## 2021-12-15 DIAGNOSIS — J449 Chronic obstructive pulmonary disease, unspecified: Secondary | ICD-10-CM | POA: Diagnosis not present

## 2021-12-15 DIAGNOSIS — J441 Chronic obstructive pulmonary disease with (acute) exacerbation: Secondary | ICD-10-CM | POA: Diagnosis not present

## 2021-12-19 DIAGNOSIS — J449 Chronic obstructive pulmonary disease, unspecified: Secondary | ICD-10-CM | POA: Diagnosis not present

## 2021-12-25 DIAGNOSIS — Z452 Encounter for adjustment and management of vascular access device: Secondary | ICD-10-CM | POA: Diagnosis not present

## 2021-12-25 DIAGNOSIS — I214 Non-ST elevation (NSTEMI) myocardial infarction: Secondary | ICD-10-CM | POA: Diagnosis not present

## 2021-12-25 DIAGNOSIS — Z9911 Dependence on respirator [ventilator] status: Secondary | ICD-10-CM | POA: Diagnosis not present

## 2021-12-25 DIAGNOSIS — E119 Type 2 diabetes mellitus without complications: Secondary | ICD-10-CM | POA: Diagnosis not present

## 2021-12-25 DIAGNOSIS — I469 Cardiac arrest, cause unspecified: Secondary | ICD-10-CM | POA: Diagnosis not present

## 2021-12-25 DIAGNOSIS — Z4682 Encounter for fitting and adjustment of non-vascular catheter: Secondary | ICD-10-CM | POA: Diagnosis not present

## 2021-12-25 DIAGNOSIS — R079 Chest pain, unspecified: Secondary | ICD-10-CM | POA: Diagnosis not present

## 2021-12-25 DIAGNOSIS — I4901 Ventricular fibrillation: Secondary | ICD-10-CM | POA: Diagnosis not present

## 2021-12-25 DIAGNOSIS — I10 Essential (primary) hypertension: Secondary | ICD-10-CM | POA: Diagnosis not present

## 2021-12-25 DIAGNOSIS — J9811 Atelectasis: Secondary | ICD-10-CM | POA: Diagnosis not present

## 2021-12-25 DIAGNOSIS — J449 Chronic obstructive pulmonary disease, unspecified: Secondary | ICD-10-CM | POA: Diagnosis not present

## 2021-12-26 ENCOUNTER — Inpatient Hospital Stay: Admit: 2021-12-26 | Payer: Medicare HMO | Admitting: Internal Medicine

## 2021-12-26 ENCOUNTER — Encounter (HOSPITAL_COMMUNITY): Payer: Self-pay

## 2021-12-26 DIAGNOSIS — K439 Ventral hernia without obstruction or gangrene: Secondary | ICD-10-CM | POA: Diagnosis not present

## 2021-12-26 DIAGNOSIS — Z743 Need for continuous supervision: Secondary | ICD-10-CM | POA: Diagnosis not present

## 2021-12-26 DIAGNOSIS — J449 Chronic obstructive pulmonary disease, unspecified: Secondary | ICD-10-CM | POA: Diagnosis not present

## 2021-12-26 DIAGNOSIS — E785 Hyperlipidemia, unspecified: Secondary | ICD-10-CM | POA: Diagnosis not present

## 2021-12-26 DIAGNOSIS — F32A Depression, unspecified: Secondary | ICD-10-CM | POA: Diagnosis not present

## 2021-12-26 DIAGNOSIS — K8689 Other specified diseases of pancreas: Secondary | ICD-10-CM | POA: Diagnosis not present

## 2021-12-26 DIAGNOSIS — J9690 Respiratory failure, unspecified, unspecified whether with hypoxia or hypercapnia: Secondary | ICD-10-CM | POA: Diagnosis not present

## 2021-12-26 DIAGNOSIS — I5021 Acute systolic (congestive) heart failure: Secondary | ICD-10-CM | POA: Diagnosis not present

## 2021-12-26 DIAGNOSIS — I493 Ventricular premature depolarization: Secondary | ICD-10-CM | POA: Diagnosis not present

## 2021-12-26 DIAGNOSIS — R0789 Other chest pain: Secondary | ICD-10-CM | POA: Diagnosis not present

## 2021-12-26 DIAGNOSIS — Z9911 Dependence on respirator [ventilator] status: Secondary | ICD-10-CM | POA: Diagnosis not present

## 2021-12-26 DIAGNOSIS — I214 Non-ST elevation (NSTEMI) myocardial infarction: Secondary | ICD-10-CM | POA: Diagnosis not present

## 2021-12-26 DIAGNOSIS — R0902 Hypoxemia: Secondary | ICD-10-CM | POA: Diagnosis not present

## 2021-12-26 DIAGNOSIS — Z452 Encounter for adjustment and management of vascular access device: Secondary | ICD-10-CM | POA: Diagnosis not present

## 2021-12-26 DIAGNOSIS — I469 Cardiac arrest, cause unspecified: Secondary | ICD-10-CM | POA: Diagnosis not present

## 2021-12-26 DIAGNOSIS — J690 Pneumonitis due to inhalation of food and vomit: Secondary | ICD-10-CM | POA: Diagnosis not present

## 2021-12-26 DIAGNOSIS — D649 Anemia, unspecified: Secondary | ICD-10-CM | POA: Diagnosis not present

## 2021-12-26 DIAGNOSIS — R6521 Severe sepsis with septic shock: Secondary | ICD-10-CM | POA: Diagnosis not present

## 2021-12-26 DIAGNOSIS — J441 Chronic obstructive pulmonary disease with (acute) exacerbation: Secondary | ICD-10-CM | POA: Diagnosis not present

## 2021-12-26 DIAGNOSIS — Z4682 Encounter for fitting and adjustment of non-vascular catheter: Secondary | ICD-10-CM | POA: Diagnosis not present

## 2021-12-26 DIAGNOSIS — R000 Tachycardia, unspecified: Secondary | ICD-10-CM | POA: Diagnosis not present

## 2021-12-26 DIAGNOSIS — E876 Hypokalemia: Secondary | ICD-10-CM | POA: Diagnosis not present

## 2021-12-26 DIAGNOSIS — K219 Gastro-esophageal reflux disease without esophagitis: Secondary | ICD-10-CM | POA: Diagnosis not present

## 2021-12-26 DIAGNOSIS — I5022 Chronic systolic (congestive) heart failure: Secondary | ICD-10-CM | POA: Diagnosis not present

## 2021-12-26 DIAGNOSIS — I5189 Other ill-defined heart diseases: Secondary | ICD-10-CM | POA: Diagnosis not present

## 2021-12-26 DIAGNOSIS — I10 Essential (primary) hypertension: Secondary | ICD-10-CM | POA: Diagnosis not present

## 2021-12-26 DIAGNOSIS — R918 Other nonspecific abnormal finding of lung field: Secondary | ICD-10-CM | POA: Diagnosis not present

## 2021-12-26 DIAGNOSIS — I110 Hypertensive heart disease with heart failure: Secondary | ICD-10-CM | POA: Diagnosis not present

## 2021-12-26 DIAGNOSIS — B9620 Unspecified Escherichia coli [E. coli] as the cause of diseases classified elsewhere: Secondary | ICD-10-CM | POA: Diagnosis not present

## 2021-12-26 DIAGNOSIS — Z0389 Encounter for observation for other suspected diseases and conditions ruled out: Secondary | ICD-10-CM | POA: Diagnosis not present

## 2021-12-26 DIAGNOSIS — J9621 Acute and chronic respiratory failure with hypoxia: Secondary | ICD-10-CM | POA: Diagnosis not present

## 2021-12-26 DIAGNOSIS — J1569 Pneumonia due to other gram-negative bacteria: Secondary | ICD-10-CM | POA: Diagnosis not present

## 2021-12-26 DIAGNOSIS — I509 Heart failure, unspecified: Secondary | ICD-10-CM | POA: Diagnosis not present

## 2021-12-26 DIAGNOSIS — M069 Rheumatoid arthritis, unspecified: Secondary | ICD-10-CM | POA: Diagnosis not present

## 2021-12-26 DIAGNOSIS — E119 Type 2 diabetes mellitus without complications: Secondary | ICD-10-CM | POA: Diagnosis not present

## 2021-12-26 DIAGNOSIS — I517 Cardiomegaly: Secondary | ICD-10-CM | POA: Diagnosis not present

## 2021-12-26 DIAGNOSIS — Z720 Tobacco use: Secondary | ICD-10-CM | POA: Diagnosis not present

## 2021-12-26 DIAGNOSIS — J984 Other disorders of lung: Secondary | ICD-10-CM | POA: Diagnosis not present

## 2021-12-26 DIAGNOSIS — F17210 Nicotine dependence, cigarettes, uncomplicated: Secondary | ICD-10-CM | POA: Diagnosis not present

## 2021-12-26 DIAGNOSIS — Z95818 Presence of other cardiac implants and grafts: Secondary | ICD-10-CM | POA: Diagnosis not present

## 2021-12-26 DIAGNOSIS — J9600 Acute respiratory failure, unspecified whether with hypoxia or hypercapnia: Secondary | ICD-10-CM | POA: Diagnosis not present

## 2021-12-26 DIAGNOSIS — R570 Cardiogenic shock: Secondary | ICD-10-CM | POA: Diagnosis not present

## 2021-12-26 DIAGNOSIS — Z7189 Other specified counseling: Secondary | ICD-10-CM | POA: Diagnosis not present

## 2021-12-26 DIAGNOSIS — B370 Candidal stomatitis: Secondary | ICD-10-CM | POA: Diagnosis not present

## 2021-12-26 DIAGNOSIS — Z1612 Extended spectrum beta lactamase (ESBL) resistance: Secondary | ICD-10-CM | POA: Diagnosis not present

## 2021-12-26 DIAGNOSIS — R69 Illness, unspecified: Secondary | ICD-10-CM | POA: Diagnosis not present

## 2021-12-26 DIAGNOSIS — I219 Acute myocardial infarction, unspecified: Secondary | ICD-10-CM | POA: Diagnosis not present

## 2021-12-26 DIAGNOSIS — D72829 Elevated white blood cell count, unspecified: Secondary | ICD-10-CM | POA: Diagnosis not present

## 2021-12-26 DIAGNOSIS — M96A2 Fracture of one rib associated with chest compression and cardiopulmonary resuscitation: Secondary | ICD-10-CM | POA: Diagnosis not present

## 2021-12-26 DIAGNOSIS — J188 Other pneumonia, unspecified organism: Secondary | ICD-10-CM | POA: Diagnosis not present

## 2021-12-26 DIAGNOSIS — A419 Sepsis, unspecified organism: Secondary | ICD-10-CM | POA: Diagnosis not present

## 2021-12-26 DIAGNOSIS — B9689 Other specified bacterial agents as the cause of diseases classified elsewhere: Secondary | ICD-10-CM | POA: Diagnosis not present

## 2021-12-26 DIAGNOSIS — I4901 Ventricular fibrillation: Secondary | ICD-10-CM | POA: Insufficient documentation

## 2021-12-26 DIAGNOSIS — J189 Pneumonia, unspecified organism: Secondary | ICD-10-CM | POA: Diagnosis not present

## 2021-12-26 DIAGNOSIS — E1169 Type 2 diabetes mellitus with other specified complication: Secondary | ICD-10-CM | POA: Diagnosis not present

## 2021-12-26 DIAGNOSIS — J9622 Acute and chronic respiratory failure with hypercapnia: Secondary | ICD-10-CM | POA: Diagnosis not present

## 2021-12-26 DIAGNOSIS — J9811 Atelectasis: Secondary | ICD-10-CM | POA: Diagnosis not present

## 2021-12-26 DIAGNOSIS — J158 Pneumonia due to other specified bacteria: Secondary | ICD-10-CM | POA: Diagnosis not present

## 2021-12-26 DIAGNOSIS — A498 Other bacterial infections of unspecified site: Secondary | ICD-10-CM | POA: Diagnosis not present

## 2021-12-26 DIAGNOSIS — Z9981 Dependence on supplemental oxygen: Secondary | ICD-10-CM | POA: Diagnosis not present

## 2021-12-26 DIAGNOSIS — D720 Genetic anomalies of leukocytes: Secondary | ICD-10-CM | POA: Diagnosis not present

## 2021-12-26 DIAGNOSIS — R9431 Abnormal electrocardiogram [ECG] [EKG]: Secondary | ICD-10-CM | POA: Diagnosis not present

## 2021-12-26 DIAGNOSIS — J811 Chronic pulmonary edema: Secondary | ICD-10-CM | POA: Diagnosis not present

## 2021-12-26 DIAGNOSIS — J9601 Acute respiratory failure with hypoxia: Secondary | ICD-10-CM | POA: Diagnosis not present

## 2021-12-26 DIAGNOSIS — J90 Pleural effusion, not elsewhere classified: Secondary | ICD-10-CM | POA: Diagnosis not present

## 2021-12-26 DIAGNOSIS — R079 Chest pain, unspecified: Secondary | ICD-10-CM | POA: Diagnosis not present

## 2021-12-26 DIAGNOSIS — R404 Transient alteration of awareness: Secondary | ICD-10-CM | POA: Diagnosis not present

## 2021-12-26 DIAGNOSIS — I2510 Atherosclerotic heart disease of native coronary artery without angina pectoris: Secondary | ICD-10-CM | POA: Diagnosis not present

## 2021-12-27 DIAGNOSIS — I214 Non-ST elevation (NSTEMI) myocardial infarction: Secondary | ICD-10-CM | POA: Diagnosis not present

## 2021-12-27 DIAGNOSIS — J9621 Acute and chronic respiratory failure with hypoxia: Secondary | ICD-10-CM | POA: Diagnosis not present

## 2021-12-27 DIAGNOSIS — R570 Cardiogenic shock: Secondary | ICD-10-CM | POA: Diagnosis not present

## 2021-12-27 DIAGNOSIS — A498 Other bacterial infections of unspecified site: Secondary | ICD-10-CM | POA: Diagnosis not present

## 2021-12-27 DIAGNOSIS — Z452 Encounter for adjustment and management of vascular access device: Secondary | ICD-10-CM | POA: Diagnosis not present

## 2021-12-27 DIAGNOSIS — E785 Hyperlipidemia, unspecified: Secondary | ICD-10-CM | POA: Diagnosis not present

## 2021-12-27 DIAGNOSIS — Z95818 Presence of other cardiac implants and grafts: Secondary | ICD-10-CM | POA: Diagnosis not present

## 2021-12-27 DIAGNOSIS — I469 Cardiac arrest, cause unspecified: Secondary | ICD-10-CM | POA: Diagnosis not present

## 2021-12-27 DIAGNOSIS — I5189 Other ill-defined heart diseases: Secondary | ICD-10-CM | POA: Diagnosis not present

## 2021-12-27 DIAGNOSIS — Z9911 Dependence on respirator [ventilator] status: Secondary | ICD-10-CM | POA: Diagnosis not present

## 2021-12-27 DIAGNOSIS — J9622 Acute and chronic respiratory failure with hypercapnia: Secondary | ICD-10-CM | POA: Diagnosis not present

## 2021-12-27 DIAGNOSIS — I4901 Ventricular fibrillation: Secondary | ICD-10-CM | POA: Diagnosis not present

## 2021-12-27 DIAGNOSIS — I509 Heart failure, unspecified: Secondary | ICD-10-CM | POA: Diagnosis not present

## 2021-12-27 DIAGNOSIS — Z4682 Encounter for fitting and adjustment of non-vascular catheter: Secondary | ICD-10-CM | POA: Diagnosis not present

## 2021-12-27 DIAGNOSIS — J449 Chronic obstructive pulmonary disease, unspecified: Secondary | ICD-10-CM | POA: Diagnosis not present

## 2021-12-27 DIAGNOSIS — E1169 Type 2 diabetes mellitus with other specified complication: Secondary | ICD-10-CM | POA: Diagnosis not present

## 2021-12-27 DIAGNOSIS — I2510 Atherosclerotic heart disease of native coronary artery without angina pectoris: Secondary | ICD-10-CM | POA: Diagnosis not present

## 2021-12-28 DIAGNOSIS — I219 Acute myocardial infarction, unspecified: Secondary | ICD-10-CM | POA: Diagnosis not present

## 2021-12-28 DIAGNOSIS — I469 Cardiac arrest, cause unspecified: Secondary | ICD-10-CM | POA: Diagnosis not present

## 2021-12-28 DIAGNOSIS — A498 Other bacterial infections of unspecified site: Secondary | ICD-10-CM | POA: Diagnosis not present

## 2021-12-28 DIAGNOSIS — R9431 Abnormal electrocardiogram [ECG] [EKG]: Secondary | ICD-10-CM | POA: Diagnosis not present

## 2021-12-28 DIAGNOSIS — I214 Non-ST elevation (NSTEMI) myocardial infarction: Secondary | ICD-10-CM | POA: Diagnosis not present

## 2021-12-28 DIAGNOSIS — J9621 Acute and chronic respiratory failure with hypoxia: Secondary | ICD-10-CM | POA: Diagnosis not present

## 2021-12-28 DIAGNOSIS — E785 Hyperlipidemia, unspecified: Secondary | ICD-10-CM | POA: Diagnosis not present

## 2021-12-28 DIAGNOSIS — I4901 Ventricular fibrillation: Secondary | ICD-10-CM | POA: Diagnosis not present

## 2021-12-28 DIAGNOSIS — R570 Cardiogenic shock: Secondary | ICD-10-CM | POA: Diagnosis not present

## 2021-12-28 DIAGNOSIS — E1169 Type 2 diabetes mellitus with other specified complication: Secondary | ICD-10-CM | POA: Diagnosis not present

## 2021-12-28 DIAGNOSIS — J9622 Acute and chronic respiratory failure with hypercapnia: Secondary | ICD-10-CM | POA: Diagnosis not present

## 2021-12-28 DIAGNOSIS — J449 Chronic obstructive pulmonary disease, unspecified: Secondary | ICD-10-CM | POA: Diagnosis not present

## 2021-12-29 DIAGNOSIS — Z9911 Dependence on respirator [ventilator] status: Secondary | ICD-10-CM | POA: Diagnosis not present

## 2021-12-29 DIAGNOSIS — I509 Heart failure, unspecified: Secondary | ICD-10-CM | POA: Diagnosis not present

## 2021-12-29 DIAGNOSIS — J90 Pleural effusion, not elsewhere classified: Secondary | ICD-10-CM | POA: Diagnosis not present

## 2021-12-29 DIAGNOSIS — J9811 Atelectasis: Secondary | ICD-10-CM | POA: Diagnosis not present

## 2021-12-29 DIAGNOSIS — J9622 Acute and chronic respiratory failure with hypercapnia: Secondary | ICD-10-CM | POA: Diagnosis not present

## 2021-12-29 DIAGNOSIS — I469 Cardiac arrest, cause unspecified: Secondary | ICD-10-CM | POA: Diagnosis not present

## 2021-12-29 DIAGNOSIS — R570 Cardiogenic shock: Secondary | ICD-10-CM | POA: Diagnosis not present

## 2021-12-29 DIAGNOSIS — I517 Cardiomegaly: Secondary | ICD-10-CM | POA: Diagnosis not present

## 2021-12-29 DIAGNOSIS — J811 Chronic pulmonary edema: Secondary | ICD-10-CM | POA: Diagnosis not present

## 2021-12-29 DIAGNOSIS — J9621 Acute and chronic respiratory failure with hypoxia: Secondary | ICD-10-CM | POA: Diagnosis not present

## 2021-12-29 DIAGNOSIS — E785 Hyperlipidemia, unspecified: Secondary | ICD-10-CM | POA: Diagnosis not present

## 2021-12-29 DIAGNOSIS — I4901 Ventricular fibrillation: Secondary | ICD-10-CM | POA: Diagnosis not present

## 2021-12-29 DIAGNOSIS — E1169 Type 2 diabetes mellitus with other specified complication: Secondary | ICD-10-CM | POA: Diagnosis not present

## 2021-12-29 DIAGNOSIS — I5189 Other ill-defined heart diseases: Secondary | ICD-10-CM | POA: Diagnosis not present

## 2021-12-29 DIAGNOSIS — Z95818 Presence of other cardiac implants and grafts: Secondary | ICD-10-CM | POA: Diagnosis not present

## 2021-12-29 DIAGNOSIS — Z4682 Encounter for fitting and adjustment of non-vascular catheter: Secondary | ICD-10-CM | POA: Diagnosis not present

## 2021-12-29 DIAGNOSIS — R000 Tachycardia, unspecified: Secondary | ICD-10-CM | POA: Diagnosis not present

## 2021-12-29 DIAGNOSIS — Z452 Encounter for adjustment and management of vascular access device: Secondary | ICD-10-CM | POA: Diagnosis not present

## 2021-12-29 DIAGNOSIS — A498 Other bacterial infections of unspecified site: Secondary | ICD-10-CM | POA: Diagnosis not present

## 2021-12-29 DIAGNOSIS — J449 Chronic obstructive pulmonary disease, unspecified: Secondary | ICD-10-CM | POA: Diagnosis not present

## 2021-12-29 DIAGNOSIS — I214 Non-ST elevation (NSTEMI) myocardial infarction: Secondary | ICD-10-CM | POA: Diagnosis not present

## 2021-12-29 DIAGNOSIS — R9431 Abnormal electrocardiogram [ECG] [EKG]: Secondary | ICD-10-CM | POA: Diagnosis not present

## 2021-12-29 DIAGNOSIS — Z0389 Encounter for observation for other suspected diseases and conditions ruled out: Secondary | ICD-10-CM | POA: Diagnosis not present

## 2021-12-30 DIAGNOSIS — J9621 Acute and chronic respiratory failure with hypoxia: Secondary | ICD-10-CM | POA: Diagnosis not present

## 2021-12-30 DIAGNOSIS — R570 Cardiogenic shock: Secondary | ICD-10-CM | POA: Diagnosis not present

## 2021-12-30 DIAGNOSIS — A419 Sepsis, unspecified organism: Secondary | ICD-10-CM | POA: Diagnosis not present

## 2021-12-30 DIAGNOSIS — J189 Pneumonia, unspecified organism: Secondary | ICD-10-CM | POA: Diagnosis not present

## 2021-12-30 DIAGNOSIS — J9622 Acute and chronic respiratory failure with hypercapnia: Secondary | ICD-10-CM | POA: Diagnosis not present

## 2021-12-30 DIAGNOSIS — Z720 Tobacco use: Secondary | ICD-10-CM | POA: Diagnosis not present

## 2021-12-30 DIAGNOSIS — I469 Cardiac arrest, cause unspecified: Secondary | ICD-10-CM | POA: Diagnosis not present

## 2021-12-30 DIAGNOSIS — I214 Non-ST elevation (NSTEMI) myocardial infarction: Secondary | ICD-10-CM | POA: Diagnosis not present

## 2021-12-30 DIAGNOSIS — I4901 Ventricular fibrillation: Secondary | ICD-10-CM | POA: Diagnosis not present

## 2021-12-31 DIAGNOSIS — I214 Non-ST elevation (NSTEMI) myocardial infarction: Secondary | ICD-10-CM | POA: Diagnosis not present

## 2021-12-31 DIAGNOSIS — R918 Other nonspecific abnormal finding of lung field: Secondary | ICD-10-CM | POA: Diagnosis not present

## 2021-12-31 DIAGNOSIS — J984 Other disorders of lung: Secondary | ICD-10-CM | POA: Diagnosis not present

## 2021-12-31 DIAGNOSIS — R570 Cardiogenic shock: Secondary | ICD-10-CM | POA: Diagnosis not present

## 2021-12-31 DIAGNOSIS — I5021 Acute systolic (congestive) heart failure: Secondary | ICD-10-CM | POA: Diagnosis not present

## 2021-12-31 DIAGNOSIS — I469 Cardiac arrest, cause unspecified: Secondary | ICD-10-CM | POA: Diagnosis not present

## 2021-12-31 DIAGNOSIS — E785 Hyperlipidemia, unspecified: Secondary | ICD-10-CM | POA: Diagnosis not present

## 2021-12-31 DIAGNOSIS — E1169 Type 2 diabetes mellitus with other specified complication: Secondary | ICD-10-CM | POA: Diagnosis not present

## 2021-12-31 DIAGNOSIS — Z9911 Dependence on respirator [ventilator] status: Secondary | ICD-10-CM | POA: Diagnosis not present

## 2021-12-31 DIAGNOSIS — Z0389 Encounter for observation for other suspected diseases and conditions ruled out: Secondary | ICD-10-CM | POA: Diagnosis not present

## 2021-12-31 DIAGNOSIS — J158 Pneumonia due to other specified bacteria: Secondary | ICD-10-CM | POA: Diagnosis not present

## 2021-12-31 DIAGNOSIS — Z4682 Encounter for fitting and adjustment of non-vascular catheter: Secondary | ICD-10-CM | POA: Diagnosis not present

## 2021-12-31 DIAGNOSIS — R69 Illness, unspecified: Secondary | ICD-10-CM | POA: Diagnosis not present

## 2021-12-31 DIAGNOSIS — J9622 Acute and chronic respiratory failure with hypercapnia: Secondary | ICD-10-CM | POA: Diagnosis not present

## 2021-12-31 DIAGNOSIS — J449 Chronic obstructive pulmonary disease, unspecified: Secondary | ICD-10-CM | POA: Diagnosis not present

## 2021-12-31 DIAGNOSIS — J9621 Acute and chronic respiratory failure with hypoxia: Secondary | ICD-10-CM | POA: Diagnosis not present

## 2021-12-31 DIAGNOSIS — I4901 Ventricular fibrillation: Secondary | ICD-10-CM | POA: Diagnosis not present

## 2022-01-01 DIAGNOSIS — J9622 Acute and chronic respiratory failure with hypercapnia: Secondary | ICD-10-CM | POA: Diagnosis not present

## 2022-01-01 DIAGNOSIS — I5021 Acute systolic (congestive) heart failure: Secondary | ICD-10-CM | POA: Insufficient documentation

## 2022-01-01 DIAGNOSIS — A419 Sepsis, unspecified organism: Secondary | ICD-10-CM | POA: Diagnosis not present

## 2022-01-01 DIAGNOSIS — R69 Illness, unspecified: Secondary | ICD-10-CM | POA: Diagnosis not present

## 2022-01-01 DIAGNOSIS — I214 Non-ST elevation (NSTEMI) myocardial infarction: Secondary | ICD-10-CM | POA: Diagnosis not present

## 2022-01-01 DIAGNOSIS — Z1612 Extended spectrum beta lactamase (ESBL) resistance: Secondary | ICD-10-CM | POA: Diagnosis not present

## 2022-01-01 DIAGNOSIS — R6521 Severe sepsis with septic shock: Secondary | ICD-10-CM | POA: Diagnosis not present

## 2022-01-01 DIAGNOSIS — R9431 Abnormal electrocardiogram [ECG] [EKG]: Secondary | ICD-10-CM | POA: Diagnosis not present

## 2022-01-01 DIAGNOSIS — J9621 Acute and chronic respiratory failure with hypoxia: Secondary | ICD-10-CM | POA: Diagnosis not present

## 2022-01-01 DIAGNOSIS — J441 Chronic obstructive pulmonary disease with (acute) exacerbation: Secondary | ICD-10-CM | POA: Diagnosis not present

## 2022-01-01 DIAGNOSIS — I4901 Ventricular fibrillation: Secondary | ICD-10-CM | POA: Diagnosis not present

## 2022-01-01 DIAGNOSIS — E1169 Type 2 diabetes mellitus with other specified complication: Secondary | ICD-10-CM | POA: Diagnosis not present

## 2022-01-01 DIAGNOSIS — R570 Cardiogenic shock: Secondary | ICD-10-CM | POA: Diagnosis not present

## 2022-01-01 DIAGNOSIS — A498 Other bacterial infections of unspecified site: Secondary | ICD-10-CM | POA: Diagnosis not present

## 2022-01-02 DIAGNOSIS — E1169 Type 2 diabetes mellitus with other specified complication: Secondary | ICD-10-CM | POA: Diagnosis not present

## 2022-01-02 DIAGNOSIS — I2510 Atherosclerotic heart disease of native coronary artery without angina pectoris: Secondary | ICD-10-CM | POA: Diagnosis not present

## 2022-01-02 DIAGNOSIS — J9622 Acute and chronic respiratory failure with hypercapnia: Secondary | ICD-10-CM | POA: Diagnosis not present

## 2022-01-02 DIAGNOSIS — J9621 Acute and chronic respiratory failure with hypoxia: Secondary | ICD-10-CM | POA: Diagnosis not present

## 2022-01-02 DIAGNOSIS — E785 Hyperlipidemia, unspecified: Secondary | ICD-10-CM | POA: Diagnosis not present

## 2022-01-02 DIAGNOSIS — J449 Chronic obstructive pulmonary disease, unspecified: Secondary | ICD-10-CM | POA: Diagnosis not present

## 2022-01-02 DIAGNOSIS — I219 Acute myocardial infarction, unspecified: Secondary | ICD-10-CM | POA: Diagnosis not present

## 2022-01-02 DIAGNOSIS — I469 Cardiac arrest, cause unspecified: Secondary | ICD-10-CM | POA: Diagnosis not present

## 2022-01-02 DIAGNOSIS — J158 Pneumonia due to other specified bacteria: Secondary | ICD-10-CM | POA: Diagnosis not present

## 2022-01-02 DIAGNOSIS — I5021 Acute systolic (congestive) heart failure: Secondary | ICD-10-CM | POA: Diagnosis not present

## 2022-01-02 DIAGNOSIS — I4901 Ventricular fibrillation: Secondary | ICD-10-CM | POA: Diagnosis not present

## 2022-01-02 DIAGNOSIS — R69 Illness, unspecified: Secondary | ICD-10-CM | POA: Diagnosis not present

## 2022-01-02 DIAGNOSIS — I214 Non-ST elevation (NSTEMI) myocardial infarction: Secondary | ICD-10-CM | POA: Diagnosis not present

## 2022-01-02 DIAGNOSIS — R570 Cardiogenic shock: Secondary | ICD-10-CM | POA: Diagnosis not present

## 2022-01-03 ENCOUNTER — Telehealth: Payer: Self-pay

## 2022-01-03 DIAGNOSIS — J9622 Acute and chronic respiratory failure with hypercapnia: Secondary | ICD-10-CM | POA: Diagnosis not present

## 2022-01-03 DIAGNOSIS — D649 Anemia, unspecified: Secondary | ICD-10-CM | POA: Diagnosis not present

## 2022-01-03 DIAGNOSIS — I2510 Atherosclerotic heart disease of native coronary artery without angina pectoris: Secondary | ICD-10-CM | POA: Diagnosis not present

## 2022-01-03 DIAGNOSIS — J9621 Acute and chronic respiratory failure with hypoxia: Secondary | ICD-10-CM | POA: Diagnosis not present

## 2022-01-03 DIAGNOSIS — Z452 Encounter for adjustment and management of vascular access device: Secondary | ICD-10-CM | POA: Diagnosis not present

## 2022-01-03 DIAGNOSIS — R9431 Abnormal electrocardiogram [ECG] [EKG]: Secondary | ICD-10-CM | POA: Diagnosis not present

## 2022-01-03 DIAGNOSIS — D720 Genetic anomalies of leukocytes: Secondary | ICD-10-CM | POA: Diagnosis not present

## 2022-01-03 DIAGNOSIS — J441 Chronic obstructive pulmonary disease with (acute) exacerbation: Secondary | ICD-10-CM | POA: Diagnosis not present

## 2022-01-03 DIAGNOSIS — R918 Other nonspecific abnormal finding of lung field: Secondary | ICD-10-CM | POA: Diagnosis not present

## 2022-01-03 DIAGNOSIS — I5021 Acute systolic (congestive) heart failure: Secondary | ICD-10-CM | POA: Diagnosis not present

## 2022-01-03 DIAGNOSIS — I214 Non-ST elevation (NSTEMI) myocardial infarction: Secondary | ICD-10-CM | POA: Diagnosis not present

## 2022-01-03 DIAGNOSIS — E1169 Type 2 diabetes mellitus with other specified complication: Secondary | ICD-10-CM | POA: Diagnosis not present

## 2022-01-03 DIAGNOSIS — R079 Chest pain, unspecified: Secondary | ICD-10-CM | POA: Diagnosis not present

## 2022-01-03 DIAGNOSIS — Z4682 Encounter for fitting and adjustment of non-vascular catheter: Secondary | ICD-10-CM | POA: Diagnosis not present

## 2022-01-03 DIAGNOSIS — E785 Hyperlipidemia, unspecified: Secondary | ICD-10-CM | POA: Diagnosis not present

## 2022-01-03 NOTE — Telephone Encounter (Signed)
        Patient  visited [facility] on 10/23    Telephone encounter attempt :  1st  A HIPAA compliant voice message was left requesting a return call.  Instructed patient to call back    [place], [place] [facility]  [phone] [address], [place], [place] [address] Phone: [phone] Email: [email or web address]

## 2022-01-04 ENCOUNTER — Telehealth: Payer: Self-pay

## 2022-01-04 DIAGNOSIS — K8689 Other specified diseases of pancreas: Secondary | ICD-10-CM | POA: Diagnosis not present

## 2022-01-04 DIAGNOSIS — J441 Chronic obstructive pulmonary disease with (acute) exacerbation: Secondary | ICD-10-CM | POA: Diagnosis not present

## 2022-01-04 DIAGNOSIS — A419 Sepsis, unspecified organism: Secondary | ICD-10-CM | POA: Diagnosis not present

## 2022-01-04 DIAGNOSIS — R079 Chest pain, unspecified: Secondary | ICD-10-CM | POA: Diagnosis not present

## 2022-01-04 DIAGNOSIS — R918 Other nonspecific abnormal finding of lung field: Secondary | ICD-10-CM | POA: Diagnosis not present

## 2022-01-04 DIAGNOSIS — J9600 Acute respiratory failure, unspecified whether with hypoxia or hypercapnia: Secondary | ICD-10-CM | POA: Diagnosis not present

## 2022-01-04 DIAGNOSIS — I5189 Other ill-defined heart diseases: Secondary | ICD-10-CM | POA: Diagnosis not present

## 2022-01-04 DIAGNOSIS — I214 Non-ST elevation (NSTEMI) myocardial infarction: Secondary | ICD-10-CM | POA: Diagnosis not present

## 2022-01-04 DIAGNOSIS — K439 Ventral hernia without obstruction or gangrene: Secondary | ICD-10-CM | POA: Diagnosis not present

## 2022-01-04 DIAGNOSIS — R6521 Severe sepsis with septic shock: Secondary | ICD-10-CM | POA: Diagnosis not present

## 2022-01-04 DIAGNOSIS — I509 Heart failure, unspecified: Secondary | ICD-10-CM | POA: Diagnosis not present

## 2022-01-04 DIAGNOSIS — R9431 Abnormal electrocardiogram [ECG] [EKG]: Secondary | ICD-10-CM | POA: Diagnosis not present

## 2022-01-04 DIAGNOSIS — I2510 Atherosclerotic heart disease of native coronary artery without angina pectoris: Secondary | ICD-10-CM | POA: Diagnosis not present

## 2022-01-04 DIAGNOSIS — I5021 Acute systolic (congestive) heart failure: Secondary | ICD-10-CM | POA: Diagnosis not present

## 2022-01-04 NOTE — Telephone Encounter (Signed)
     Patient  visit on 10/23  at [facility]   Have you been able to follow up with your primary care physician? yes  The patient was or was not able to obtain any needed medicine or equipment. yes  Are there diet recommendations that you are having difficulty following? na  Patient expresses understanding of discharge instructions and education provided has no other needs at this time.  yes    [place], Care Management  [phone] [address], [place], [place] [address] Phone: [phone] Email: [email or web address]

## 2022-01-05 DIAGNOSIS — A419 Sepsis, unspecified organism: Secondary | ICD-10-CM | POA: Diagnosis not present

## 2022-01-05 DIAGNOSIS — R9431 Abnormal electrocardiogram [ECG] [EKG]: Secondary | ICD-10-CM | POA: Diagnosis not present

## 2022-01-05 DIAGNOSIS — I219 Acute myocardial infarction, unspecified: Secondary | ICD-10-CM | POA: Diagnosis not present

## 2022-01-05 DIAGNOSIS — R6521 Severe sepsis with septic shock: Secondary | ICD-10-CM | POA: Diagnosis not present

## 2022-01-05 DIAGNOSIS — I214 Non-ST elevation (NSTEMI) myocardial infarction: Secondary | ICD-10-CM | POA: Diagnosis not present

## 2022-01-05 DIAGNOSIS — J441 Chronic obstructive pulmonary disease with (acute) exacerbation: Secondary | ICD-10-CM | POA: Diagnosis not present

## 2022-01-05 DIAGNOSIS — I5021 Acute systolic (congestive) heart failure: Secondary | ICD-10-CM | POA: Diagnosis not present

## 2022-01-05 DIAGNOSIS — I493 Ventricular premature depolarization: Secondary | ICD-10-CM | POA: Diagnosis not present

## 2022-01-05 DIAGNOSIS — I2510 Atherosclerotic heart disease of native coronary artery without angina pectoris: Secondary | ICD-10-CM | POA: Diagnosis not present

## 2022-01-06 DIAGNOSIS — R9431 Abnormal electrocardiogram [ECG] [EKG]: Secondary | ICD-10-CM | POA: Diagnosis not present

## 2022-01-06 DIAGNOSIS — R69 Illness, unspecified: Secondary | ICD-10-CM | POA: Diagnosis not present

## 2022-01-06 DIAGNOSIS — E1169 Type 2 diabetes mellitus with other specified complication: Secondary | ICD-10-CM | POA: Diagnosis not present

## 2022-01-06 DIAGNOSIS — J9622 Acute and chronic respiratory failure with hypercapnia: Secondary | ICD-10-CM | POA: Diagnosis not present

## 2022-01-06 DIAGNOSIS — R6521 Severe sepsis with septic shock: Secondary | ICD-10-CM | POA: Diagnosis not present

## 2022-01-06 DIAGNOSIS — A419 Sepsis, unspecified organism: Secondary | ICD-10-CM | POA: Diagnosis not present

## 2022-01-06 DIAGNOSIS — I4901 Ventricular fibrillation: Secondary | ICD-10-CM | POA: Diagnosis not present

## 2022-01-06 DIAGNOSIS — J441 Chronic obstructive pulmonary disease with (acute) exacerbation: Secondary | ICD-10-CM | POA: Diagnosis not present

## 2022-01-06 DIAGNOSIS — I214 Non-ST elevation (NSTEMI) myocardial infarction: Secondary | ICD-10-CM | POA: Diagnosis not present

## 2022-01-06 DIAGNOSIS — I2510 Atherosclerotic heart disease of native coronary artery without angina pectoris: Secondary | ICD-10-CM | POA: Diagnosis not present

## 2022-01-06 DIAGNOSIS — A498 Other bacterial infections of unspecified site: Secondary | ICD-10-CM | POA: Diagnosis not present

## 2022-01-06 DIAGNOSIS — I5021 Acute systolic (congestive) heart failure: Secondary | ICD-10-CM | POA: Diagnosis not present

## 2022-01-06 DIAGNOSIS — J9621 Acute and chronic respiratory failure with hypoxia: Secondary | ICD-10-CM | POA: Diagnosis not present

## 2022-01-06 DIAGNOSIS — Z1612 Extended spectrum beta lactamase (ESBL) resistance: Secondary | ICD-10-CM | POA: Diagnosis not present

## 2022-01-07 DIAGNOSIS — A498 Other bacterial infections of unspecified site: Secondary | ICD-10-CM | POA: Diagnosis not present

## 2022-01-07 DIAGNOSIS — R0902 Hypoxemia: Secondary | ICD-10-CM | POA: Diagnosis not present

## 2022-01-07 DIAGNOSIS — E876 Hypokalemia: Secondary | ICD-10-CM | POA: Diagnosis not present

## 2022-01-07 DIAGNOSIS — I5022 Chronic systolic (congestive) heart failure: Secondary | ICD-10-CM | POA: Diagnosis not present

## 2022-01-07 DIAGNOSIS — J449 Chronic obstructive pulmonary disease, unspecified: Secondary | ICD-10-CM | POA: Diagnosis not present

## 2022-01-07 DIAGNOSIS — J189 Pneumonia, unspecified organism: Secondary | ICD-10-CM | POA: Diagnosis not present

## 2022-01-07 DIAGNOSIS — R918 Other nonspecific abnormal finding of lung field: Secondary | ICD-10-CM | POA: Diagnosis not present

## 2022-01-07 DIAGNOSIS — R9431 Abnormal electrocardiogram [ECG] [EKG]: Secondary | ICD-10-CM | POA: Diagnosis not present

## 2022-01-07 DIAGNOSIS — J441 Chronic obstructive pulmonary disease with (acute) exacerbation: Secondary | ICD-10-CM | POA: Diagnosis not present

## 2022-01-07 DIAGNOSIS — I5021 Acute systolic (congestive) heart failure: Secondary | ICD-10-CM | POA: Diagnosis not present

## 2022-01-07 DIAGNOSIS — I214 Non-ST elevation (NSTEMI) myocardial infarction: Secondary | ICD-10-CM | POA: Diagnosis not present

## 2022-01-07 DIAGNOSIS — I2510 Atherosclerotic heart disease of native coronary artery without angina pectoris: Secondary | ICD-10-CM | POA: Diagnosis not present

## 2022-01-07 DIAGNOSIS — J9601 Acute respiratory failure with hypoxia: Secondary | ICD-10-CM | POA: Diagnosis not present

## 2022-01-08 DIAGNOSIS — I214 Non-ST elevation (NSTEMI) myocardial infarction: Secondary | ICD-10-CM | POA: Diagnosis not present

## 2022-01-08 DIAGNOSIS — J441 Chronic obstructive pulmonary disease with (acute) exacerbation: Secondary | ICD-10-CM | POA: Diagnosis not present

## 2022-01-08 DIAGNOSIS — J9622 Acute and chronic respiratory failure with hypercapnia: Secondary | ICD-10-CM | POA: Diagnosis not present

## 2022-01-08 DIAGNOSIS — D72829 Elevated white blood cell count, unspecified: Secondary | ICD-10-CM | POA: Diagnosis not present

## 2022-01-08 DIAGNOSIS — I2510 Atherosclerotic heart disease of native coronary artery without angina pectoris: Secondary | ICD-10-CM | POA: Diagnosis not present

## 2022-01-08 DIAGNOSIS — R69 Illness, unspecified: Secondary | ICD-10-CM | POA: Diagnosis not present

## 2022-01-08 DIAGNOSIS — I5021 Acute systolic (congestive) heart failure: Secondary | ICD-10-CM | POA: Diagnosis not present

## 2022-01-08 DIAGNOSIS — R9431 Abnormal electrocardiogram [ECG] [EKG]: Secondary | ICD-10-CM | POA: Diagnosis not present

## 2022-01-08 DIAGNOSIS — J9621 Acute and chronic respiratory failure with hypoxia: Secondary | ICD-10-CM | POA: Diagnosis not present

## 2022-01-09 DIAGNOSIS — I214 Non-ST elevation (NSTEMI) myocardial infarction: Secondary | ICD-10-CM | POA: Diagnosis not present

## 2022-01-09 DIAGNOSIS — Z1612 Extended spectrum beta lactamase (ESBL) resistance: Secondary | ICD-10-CM | POA: Diagnosis not present

## 2022-01-09 DIAGNOSIS — J441 Chronic obstructive pulmonary disease with (acute) exacerbation: Secondary | ICD-10-CM | POA: Diagnosis not present

## 2022-01-09 DIAGNOSIS — J9622 Acute and chronic respiratory failure with hypercapnia: Secondary | ICD-10-CM | POA: Diagnosis not present

## 2022-01-09 DIAGNOSIS — B370 Candidal stomatitis: Secondary | ICD-10-CM | POA: Diagnosis not present

## 2022-01-09 DIAGNOSIS — E876 Hypokalemia: Secondary | ICD-10-CM | POA: Diagnosis not present

## 2022-01-09 DIAGNOSIS — J9690 Respiratory failure, unspecified, unspecified whether with hypoxia or hypercapnia: Secondary | ICD-10-CM | POA: Diagnosis not present

## 2022-01-09 DIAGNOSIS — J1569 Pneumonia due to other gram-negative bacteria: Secondary | ICD-10-CM | POA: Diagnosis not present

## 2022-01-09 DIAGNOSIS — R9431 Abnormal electrocardiogram [ECG] [EKG]: Secondary | ICD-10-CM | POA: Diagnosis not present

## 2022-01-09 DIAGNOSIS — I219 Acute myocardial infarction, unspecified: Secondary | ICD-10-CM | POA: Diagnosis not present

## 2022-01-09 DIAGNOSIS — J9621 Acute and chronic respiratory failure with hypoxia: Secondary | ICD-10-CM | POA: Diagnosis not present

## 2022-01-09 DIAGNOSIS — D72829 Elevated white blood cell count, unspecified: Secondary | ICD-10-CM | POA: Diagnosis not present

## 2022-01-09 DIAGNOSIS — A498 Other bacterial infections of unspecified site: Secondary | ICD-10-CM | POA: Diagnosis not present

## 2022-01-09 DIAGNOSIS — I5021 Acute systolic (congestive) heart failure: Secondary | ICD-10-CM | POA: Diagnosis not present

## 2022-01-09 DIAGNOSIS — R918 Other nonspecific abnormal finding of lung field: Secondary | ICD-10-CM | POA: Diagnosis not present

## 2022-01-10 DIAGNOSIS — J441 Chronic obstructive pulmonary disease with (acute) exacerbation: Secondary | ICD-10-CM | POA: Diagnosis not present

## 2022-01-10 DIAGNOSIS — I5021 Acute systolic (congestive) heart failure: Secondary | ICD-10-CM | POA: Diagnosis not present

## 2022-01-10 DIAGNOSIS — E876 Hypokalemia: Secondary | ICD-10-CM | POA: Diagnosis not present

## 2022-01-10 DIAGNOSIS — J9622 Acute and chronic respiratory failure with hypercapnia: Secondary | ICD-10-CM | POA: Diagnosis not present

## 2022-01-10 DIAGNOSIS — I214 Non-ST elevation (NSTEMI) myocardial infarction: Secondary | ICD-10-CM | POA: Diagnosis not present

## 2022-01-10 DIAGNOSIS — A498 Other bacterial infections of unspecified site: Secondary | ICD-10-CM | POA: Diagnosis not present

## 2022-01-10 DIAGNOSIS — B370 Candidal stomatitis: Secondary | ICD-10-CM | POA: Diagnosis not present

## 2022-01-10 DIAGNOSIS — R9431 Abnormal electrocardiogram [ECG] [EKG]: Secondary | ICD-10-CM | POA: Diagnosis not present

## 2022-01-10 DIAGNOSIS — I219 Acute myocardial infarction, unspecified: Secondary | ICD-10-CM | POA: Diagnosis not present

## 2022-01-10 DIAGNOSIS — Z1612 Extended spectrum beta lactamase (ESBL) resistance: Secondary | ICD-10-CM | POA: Diagnosis not present

## 2022-01-10 DIAGNOSIS — J9621 Acute and chronic respiratory failure with hypoxia: Secondary | ICD-10-CM | POA: Diagnosis not present

## 2022-01-11 DIAGNOSIS — A498 Other bacterial infections of unspecified site: Secondary | ICD-10-CM | POA: Diagnosis not present

## 2022-01-11 DIAGNOSIS — J9601 Acute respiratory failure with hypoxia: Secondary | ICD-10-CM | POA: Diagnosis not present

## 2022-01-11 DIAGNOSIS — I214 Non-ST elevation (NSTEMI) myocardial infarction: Secondary | ICD-10-CM | POA: Diagnosis not present

## 2022-01-11 DIAGNOSIS — J9622 Acute and chronic respiratory failure with hypercapnia: Secondary | ICD-10-CM | POA: Diagnosis not present

## 2022-01-11 DIAGNOSIS — I5021 Acute systolic (congestive) heart failure: Secondary | ICD-10-CM | POA: Diagnosis not present

## 2022-01-11 DIAGNOSIS — J441 Chronic obstructive pulmonary disease with (acute) exacerbation: Secondary | ICD-10-CM | POA: Diagnosis not present

## 2022-01-11 DIAGNOSIS — J9621 Acute and chronic respiratory failure with hypoxia: Secondary | ICD-10-CM | POA: Diagnosis not present

## 2022-01-11 DIAGNOSIS — Z1612 Extended spectrum beta lactamase (ESBL) resistance: Secondary | ICD-10-CM | POA: Diagnosis not present

## 2022-01-11 DIAGNOSIS — I4901 Ventricular fibrillation: Secondary | ICD-10-CM | POA: Diagnosis not present

## 2022-01-12 DIAGNOSIS — B9620 Unspecified Escherichia coli [E. coli] as the cause of diseases classified elsewhere: Secondary | ICD-10-CM | POA: Diagnosis not present

## 2022-01-12 DIAGNOSIS — D72829 Elevated white blood cell count, unspecified: Secondary | ICD-10-CM | POA: Diagnosis not present

## 2022-01-12 DIAGNOSIS — J9601 Acute respiratory failure with hypoxia: Secondary | ICD-10-CM | POA: Diagnosis not present

## 2022-01-12 DIAGNOSIS — I214 Non-ST elevation (NSTEMI) myocardial infarction: Secondary | ICD-10-CM | POA: Diagnosis not present

## 2022-01-12 DIAGNOSIS — I5021 Acute systolic (congestive) heart failure: Secondary | ICD-10-CM | POA: Diagnosis not present

## 2022-01-12 DIAGNOSIS — R9431 Abnormal electrocardiogram [ECG] [EKG]: Secondary | ICD-10-CM | POA: Diagnosis not present

## 2022-01-12 DIAGNOSIS — I4901 Ventricular fibrillation: Secondary | ICD-10-CM | POA: Diagnosis not present

## 2022-01-12 DIAGNOSIS — J9621 Acute and chronic respiratory failure with hypoxia: Secondary | ICD-10-CM | POA: Diagnosis not present

## 2022-01-12 DIAGNOSIS — Z1612 Extended spectrum beta lactamase (ESBL) resistance: Secondary | ICD-10-CM | POA: Diagnosis not present

## 2022-01-12 DIAGNOSIS — J9622 Acute and chronic respiratory failure with hypercapnia: Secondary | ICD-10-CM | POA: Diagnosis not present

## 2022-01-12 DIAGNOSIS — A498 Other bacterial infections of unspecified site: Secondary | ICD-10-CM | POA: Diagnosis not present

## 2022-01-12 DIAGNOSIS — J441 Chronic obstructive pulmonary disease with (acute) exacerbation: Secondary | ICD-10-CM | POA: Diagnosis not present

## 2022-01-12 DIAGNOSIS — I2510 Atherosclerotic heart disease of native coronary artery without angina pectoris: Secondary | ICD-10-CM | POA: Diagnosis not present

## 2022-01-13 DIAGNOSIS — J440 Chronic obstructive pulmonary disease with acute lower respiratory infection: Secondary | ICD-10-CM | POA: Diagnosis not present

## 2022-01-13 DIAGNOSIS — E785 Hyperlipidemia, unspecified: Secondary | ICD-10-CM | POA: Diagnosis not present

## 2022-01-13 DIAGNOSIS — J9622 Acute and chronic respiratory failure with hypercapnia: Secondary | ICD-10-CM | POA: Diagnosis not present

## 2022-01-13 DIAGNOSIS — R7881 Bacteremia: Secondary | ICD-10-CM | POA: Diagnosis not present

## 2022-01-13 DIAGNOSIS — Z87440 Personal history of urinary (tract) infections: Secondary | ICD-10-CM | POA: Diagnosis not present

## 2022-01-13 DIAGNOSIS — I214 Non-ST elevation (NSTEMI) myocardial infarction: Secondary | ICD-10-CM | POA: Diagnosis not present

## 2022-01-13 DIAGNOSIS — I110 Hypertensive heart disease with heart failure: Secondary | ICD-10-CM | POA: Diagnosis not present

## 2022-01-13 DIAGNOSIS — I219 Acute myocardial infarction, unspecified: Secondary | ICD-10-CM | POA: Diagnosis not present

## 2022-01-13 DIAGNOSIS — F32A Depression, unspecified: Secondary | ICD-10-CM | POA: Diagnosis not present

## 2022-01-13 DIAGNOSIS — F17210 Nicotine dependence, cigarettes, uncomplicated: Secondary | ICD-10-CM | POA: Diagnosis not present

## 2022-01-13 DIAGNOSIS — I4901 Ventricular fibrillation: Secondary | ICD-10-CM | POA: Diagnosis not present

## 2022-01-13 DIAGNOSIS — Z452 Encounter for adjustment and management of vascular access device: Secondary | ICD-10-CM | POA: Diagnosis not present

## 2022-01-13 DIAGNOSIS — Z7902 Long term (current) use of antithrombotics/antiplatelets: Secondary | ICD-10-CM | POA: Diagnosis not present

## 2022-01-13 DIAGNOSIS — Z7982 Long term (current) use of aspirin: Secondary | ICD-10-CM | POA: Diagnosis not present

## 2022-01-13 DIAGNOSIS — Z9181 History of falling: Secondary | ICD-10-CM | POA: Diagnosis not present

## 2022-01-13 DIAGNOSIS — Z9981 Dependence on supplemental oxygen: Secondary | ICD-10-CM | POA: Diagnosis not present

## 2022-01-13 DIAGNOSIS — Z1612 Extended spectrum beta lactamase (ESBL) resistance: Secondary | ICD-10-CM | POA: Diagnosis not present

## 2022-01-13 DIAGNOSIS — I493 Ventricular premature depolarization: Secondary | ICD-10-CM | POA: Diagnosis not present

## 2022-01-13 DIAGNOSIS — J9601 Acute respiratory failure with hypoxia: Secondary | ICD-10-CM | POA: Diagnosis not present

## 2022-01-13 DIAGNOSIS — M069 Rheumatoid arthritis, unspecified: Secondary | ICD-10-CM | POA: Diagnosis not present

## 2022-01-13 DIAGNOSIS — N390 Urinary tract infection, site not specified: Secondary | ICD-10-CM | POA: Diagnosis not present

## 2022-01-13 DIAGNOSIS — I5021 Acute systolic (congestive) heart failure: Secondary | ICD-10-CM | POA: Diagnosis not present

## 2022-01-13 DIAGNOSIS — J189 Pneumonia, unspecified organism: Secondary | ICD-10-CM | POA: Diagnosis not present

## 2022-01-13 DIAGNOSIS — J690 Pneumonitis due to inhalation of food and vomit: Secondary | ICD-10-CM | POA: Diagnosis not present

## 2022-01-13 DIAGNOSIS — Z8674 Personal history of sudden cardiac arrest: Secondary | ICD-10-CM | POA: Diagnosis not present

## 2022-01-13 DIAGNOSIS — I509 Heart failure, unspecified: Secondary | ICD-10-CM | POA: Diagnosis not present

## 2022-01-13 DIAGNOSIS — B9620 Unspecified Escherichia coli [E. coli] as the cause of diseases classified elsewhere: Secondary | ICD-10-CM | POA: Diagnosis not present

## 2022-01-13 DIAGNOSIS — A498 Other bacterial infections of unspecified site: Secondary | ICD-10-CM | POA: Diagnosis not present

## 2022-01-13 DIAGNOSIS — J441 Chronic obstructive pulmonary disease with (acute) exacerbation: Secondary | ICD-10-CM | POA: Diagnosis not present

## 2022-01-13 DIAGNOSIS — J9621 Acute and chronic respiratory failure with hypoxia: Secondary | ICD-10-CM | POA: Diagnosis not present

## 2022-01-13 DIAGNOSIS — K219 Gastro-esophageal reflux disease without esophagitis: Secondary | ICD-10-CM | POA: Diagnosis not present

## 2022-01-16 DIAGNOSIS — J449 Chronic obstructive pulmonary disease, unspecified: Secondary | ICD-10-CM | POA: Diagnosis not present

## 2022-01-16 DIAGNOSIS — J441 Chronic obstructive pulmonary disease with (acute) exacerbation: Secondary | ICD-10-CM | POA: Diagnosis not present

## 2022-01-17 DIAGNOSIS — J9622 Acute and chronic respiratory failure with hypercapnia: Secondary | ICD-10-CM | POA: Diagnosis not present

## 2022-01-17 DIAGNOSIS — I509 Heart failure, unspecified: Secondary | ICD-10-CM | POA: Diagnosis not present

## 2022-01-17 DIAGNOSIS — Z9181 History of falling: Secondary | ICD-10-CM | POA: Diagnosis not present

## 2022-01-17 DIAGNOSIS — E785 Hyperlipidemia, unspecified: Secondary | ICD-10-CM | POA: Diagnosis not present

## 2022-01-17 DIAGNOSIS — Z8674 Personal history of sudden cardiac arrest: Secondary | ICD-10-CM | POA: Diagnosis not present

## 2022-01-17 DIAGNOSIS — I214 Non-ST elevation (NSTEMI) myocardial infarction: Secondary | ICD-10-CM | POA: Diagnosis not present

## 2022-01-17 DIAGNOSIS — Z7982 Long term (current) use of aspirin: Secondary | ICD-10-CM | POA: Diagnosis not present

## 2022-01-17 DIAGNOSIS — J9621 Acute and chronic respiratory failure with hypoxia: Secondary | ICD-10-CM | POA: Diagnosis not present

## 2022-01-17 DIAGNOSIS — F17210 Nicotine dependence, cigarettes, uncomplicated: Secondary | ICD-10-CM | POA: Diagnosis not present

## 2022-01-17 DIAGNOSIS — J440 Chronic obstructive pulmonary disease with acute lower respiratory infection: Secondary | ICD-10-CM | POA: Diagnosis not present

## 2022-01-17 DIAGNOSIS — K219 Gastro-esophageal reflux disease without esophagitis: Secondary | ICD-10-CM | POA: Diagnosis not present

## 2022-01-17 DIAGNOSIS — M069 Rheumatoid arthritis, unspecified: Secondary | ICD-10-CM | POA: Diagnosis not present

## 2022-01-17 DIAGNOSIS — B9620 Unspecified Escherichia coli [E. coli] as the cause of diseases classified elsewhere: Secondary | ICD-10-CM | POA: Diagnosis not present

## 2022-01-17 DIAGNOSIS — F32A Depression, unspecified: Secondary | ICD-10-CM | POA: Diagnosis not present

## 2022-01-17 DIAGNOSIS — I110 Hypertensive heart disease with heart failure: Secondary | ICD-10-CM | POA: Diagnosis not present

## 2022-01-17 DIAGNOSIS — R7881 Bacteremia: Secondary | ICD-10-CM | POA: Diagnosis not present

## 2022-01-17 DIAGNOSIS — Z452 Encounter for adjustment and management of vascular access device: Secondary | ICD-10-CM | POA: Diagnosis not present

## 2022-01-17 DIAGNOSIS — N390 Urinary tract infection, site not specified: Secondary | ICD-10-CM | POA: Diagnosis not present

## 2022-01-17 DIAGNOSIS — J189 Pneumonia, unspecified organism: Secondary | ICD-10-CM | POA: Diagnosis not present

## 2022-01-17 DIAGNOSIS — Z7902 Long term (current) use of antithrombotics/antiplatelets: Secondary | ICD-10-CM | POA: Diagnosis not present

## 2022-01-18 DIAGNOSIS — I110 Hypertensive heart disease with heart failure: Secondary | ICD-10-CM | POA: Diagnosis not present

## 2022-01-18 DIAGNOSIS — K219 Gastro-esophageal reflux disease without esophagitis: Secondary | ICD-10-CM | POA: Diagnosis not present

## 2022-01-18 DIAGNOSIS — R7881 Bacteremia: Secondary | ICD-10-CM | POA: Diagnosis not present

## 2022-01-18 DIAGNOSIS — Z452 Encounter for adjustment and management of vascular access device: Secondary | ICD-10-CM | POA: Diagnosis not present

## 2022-01-18 DIAGNOSIS — Z7982 Long term (current) use of aspirin: Secondary | ICD-10-CM | POA: Diagnosis not present

## 2022-01-18 DIAGNOSIS — N390 Urinary tract infection, site not specified: Secondary | ICD-10-CM | POA: Diagnosis not present

## 2022-01-18 DIAGNOSIS — J9622 Acute and chronic respiratory failure with hypercapnia: Secondary | ICD-10-CM | POA: Diagnosis not present

## 2022-01-18 DIAGNOSIS — A419 Sepsis, unspecified organism: Secondary | ICD-10-CM | POA: Diagnosis not present

## 2022-01-18 DIAGNOSIS — M069 Rheumatoid arthritis, unspecified: Secondary | ICD-10-CM | POA: Diagnosis not present

## 2022-01-18 DIAGNOSIS — Z9181 History of falling: Secondary | ICD-10-CM | POA: Diagnosis not present

## 2022-01-18 DIAGNOSIS — F17210 Nicotine dependence, cigarettes, uncomplicated: Secondary | ICD-10-CM | POA: Diagnosis not present

## 2022-01-18 DIAGNOSIS — Z7902 Long term (current) use of antithrombotics/antiplatelets: Secondary | ICD-10-CM | POA: Diagnosis not present

## 2022-01-18 DIAGNOSIS — E785 Hyperlipidemia, unspecified: Secondary | ICD-10-CM | POA: Diagnosis not present

## 2022-01-18 DIAGNOSIS — Z8674 Personal history of sudden cardiac arrest: Secondary | ICD-10-CM | POA: Diagnosis not present

## 2022-01-18 DIAGNOSIS — I214 Non-ST elevation (NSTEMI) myocardial infarction: Secondary | ICD-10-CM | POA: Diagnosis not present

## 2022-01-18 DIAGNOSIS — J9621 Acute and chronic respiratory failure with hypoxia: Secondary | ICD-10-CM | POA: Diagnosis not present

## 2022-01-18 DIAGNOSIS — J189 Pneumonia, unspecified organism: Secondary | ICD-10-CM | POA: Diagnosis not present

## 2022-01-18 DIAGNOSIS — I509 Heart failure, unspecified: Secondary | ICD-10-CM | POA: Diagnosis not present

## 2022-01-18 DIAGNOSIS — B9620 Unspecified Escherichia coli [E. coli] as the cause of diseases classified elsewhere: Secondary | ICD-10-CM | POA: Diagnosis not present

## 2022-01-18 DIAGNOSIS — F32A Depression, unspecified: Secondary | ICD-10-CM | POA: Diagnosis not present

## 2022-01-18 DIAGNOSIS — J440 Chronic obstructive pulmonary disease with acute lower respiratory infection: Secondary | ICD-10-CM | POA: Diagnosis not present

## 2022-01-19 DIAGNOSIS — J449 Chronic obstructive pulmonary disease, unspecified: Secondary | ICD-10-CM | POA: Diagnosis not present

## 2022-01-20 DIAGNOSIS — B9620 Unspecified Escherichia coli [E. coli] as the cause of diseases classified elsewhere: Secondary | ICD-10-CM | POA: Diagnosis not present

## 2022-01-20 DIAGNOSIS — Z452 Encounter for adjustment and management of vascular access device: Secondary | ICD-10-CM | POA: Diagnosis not present

## 2022-01-20 DIAGNOSIS — J9621 Acute and chronic respiratory failure with hypoxia: Secondary | ICD-10-CM | POA: Diagnosis not present

## 2022-01-20 DIAGNOSIS — R7881 Bacteremia: Secondary | ICD-10-CM | POA: Diagnosis not present

## 2022-01-20 DIAGNOSIS — I214 Non-ST elevation (NSTEMI) myocardial infarction: Secondary | ICD-10-CM | POA: Diagnosis not present

## 2022-01-20 DIAGNOSIS — J189 Pneumonia, unspecified organism: Secondary | ICD-10-CM | POA: Diagnosis not present

## 2022-01-20 DIAGNOSIS — Z9181 History of falling: Secondary | ICD-10-CM | POA: Diagnosis not present

## 2022-01-20 DIAGNOSIS — Z7902 Long term (current) use of antithrombotics/antiplatelets: Secondary | ICD-10-CM | POA: Diagnosis not present

## 2022-01-20 DIAGNOSIS — Z8674 Personal history of sudden cardiac arrest: Secondary | ICD-10-CM | POA: Diagnosis not present

## 2022-01-20 DIAGNOSIS — E785 Hyperlipidemia, unspecified: Secondary | ICD-10-CM | POA: Diagnosis not present

## 2022-01-20 DIAGNOSIS — Z7982 Long term (current) use of aspirin: Secondary | ICD-10-CM | POA: Diagnosis not present

## 2022-01-20 DIAGNOSIS — N390 Urinary tract infection, site not specified: Secondary | ICD-10-CM | POA: Diagnosis not present

## 2022-01-20 DIAGNOSIS — F32A Depression, unspecified: Secondary | ICD-10-CM | POA: Diagnosis not present

## 2022-01-20 DIAGNOSIS — F17210 Nicotine dependence, cigarettes, uncomplicated: Secondary | ICD-10-CM | POA: Diagnosis not present

## 2022-01-20 DIAGNOSIS — J440 Chronic obstructive pulmonary disease with acute lower respiratory infection: Secondary | ICD-10-CM | POA: Diagnosis not present

## 2022-01-20 DIAGNOSIS — M069 Rheumatoid arthritis, unspecified: Secondary | ICD-10-CM | POA: Diagnosis not present

## 2022-01-20 DIAGNOSIS — J9622 Acute and chronic respiratory failure with hypercapnia: Secondary | ICD-10-CM | POA: Diagnosis not present

## 2022-01-20 DIAGNOSIS — K219 Gastro-esophageal reflux disease without esophagitis: Secondary | ICD-10-CM | POA: Diagnosis not present

## 2022-01-20 DIAGNOSIS — I509 Heart failure, unspecified: Secondary | ICD-10-CM | POA: Diagnosis not present

## 2022-01-20 DIAGNOSIS — I110 Hypertensive heart disease with heart failure: Secondary | ICD-10-CM | POA: Diagnosis not present

## 2022-01-23 DIAGNOSIS — F17210 Nicotine dependence, cigarettes, uncomplicated: Secondary | ICD-10-CM | POA: Diagnosis not present

## 2022-01-23 DIAGNOSIS — R7881 Bacteremia: Secondary | ICD-10-CM | POA: Diagnosis not present

## 2022-01-23 DIAGNOSIS — K219 Gastro-esophageal reflux disease without esophagitis: Secondary | ICD-10-CM | POA: Diagnosis not present

## 2022-01-23 DIAGNOSIS — Z7982 Long term (current) use of aspirin: Secondary | ICD-10-CM | POA: Diagnosis not present

## 2022-01-23 DIAGNOSIS — Z452 Encounter for adjustment and management of vascular access device: Secondary | ICD-10-CM | POA: Diagnosis not present

## 2022-01-23 DIAGNOSIS — Z7902 Long term (current) use of antithrombotics/antiplatelets: Secondary | ICD-10-CM | POA: Diagnosis not present

## 2022-01-23 DIAGNOSIS — Z8674 Personal history of sudden cardiac arrest: Secondary | ICD-10-CM | POA: Diagnosis not present

## 2022-01-23 DIAGNOSIS — I110 Hypertensive heart disease with heart failure: Secondary | ICD-10-CM | POA: Diagnosis not present

## 2022-01-23 DIAGNOSIS — I509 Heart failure, unspecified: Secondary | ICD-10-CM | POA: Diagnosis not present

## 2022-01-23 DIAGNOSIS — J9621 Acute and chronic respiratory failure with hypoxia: Secondary | ICD-10-CM | POA: Diagnosis not present

## 2022-01-23 DIAGNOSIS — E785 Hyperlipidemia, unspecified: Secondary | ICD-10-CM | POA: Diagnosis not present

## 2022-01-23 DIAGNOSIS — J440 Chronic obstructive pulmonary disease with acute lower respiratory infection: Secondary | ICD-10-CM | POA: Diagnosis not present

## 2022-01-23 DIAGNOSIS — I214 Non-ST elevation (NSTEMI) myocardial infarction: Secondary | ICD-10-CM | POA: Diagnosis not present

## 2022-01-23 DIAGNOSIS — J189 Pneumonia, unspecified organism: Secondary | ICD-10-CM | POA: Diagnosis not present

## 2022-01-23 DIAGNOSIS — Z9181 History of falling: Secondary | ICD-10-CM | POA: Diagnosis not present

## 2022-01-23 DIAGNOSIS — J9622 Acute and chronic respiratory failure with hypercapnia: Secondary | ICD-10-CM | POA: Diagnosis not present

## 2022-01-23 DIAGNOSIS — N390 Urinary tract infection, site not specified: Secondary | ICD-10-CM | POA: Diagnosis not present

## 2022-01-23 DIAGNOSIS — J449 Chronic obstructive pulmonary disease, unspecified: Secondary | ICD-10-CM | POA: Diagnosis not present

## 2022-01-23 DIAGNOSIS — M069 Rheumatoid arthritis, unspecified: Secondary | ICD-10-CM | POA: Diagnosis not present

## 2022-01-23 DIAGNOSIS — B9620 Unspecified Escherichia coli [E. coli] as the cause of diseases classified elsewhere: Secondary | ICD-10-CM | POA: Diagnosis not present

## 2022-01-23 DIAGNOSIS — F32A Depression, unspecified: Secondary | ICD-10-CM | POA: Diagnosis not present

## 2022-01-24 DIAGNOSIS — J9622 Acute and chronic respiratory failure with hypercapnia: Secondary | ICD-10-CM | POA: Diagnosis not present

## 2022-01-24 DIAGNOSIS — J440 Chronic obstructive pulmonary disease with acute lower respiratory infection: Secondary | ICD-10-CM | POA: Diagnosis not present

## 2022-01-24 DIAGNOSIS — N390 Urinary tract infection, site not specified: Secondary | ICD-10-CM | POA: Diagnosis not present

## 2022-01-24 DIAGNOSIS — Z452 Encounter for adjustment and management of vascular access device: Secondary | ICD-10-CM | POA: Diagnosis not present

## 2022-01-24 DIAGNOSIS — M069 Rheumatoid arthritis, unspecified: Secondary | ICD-10-CM | POA: Diagnosis not present

## 2022-01-24 DIAGNOSIS — J9621 Acute and chronic respiratory failure with hypoxia: Secondary | ICD-10-CM | POA: Diagnosis not present

## 2022-01-24 DIAGNOSIS — Z9181 History of falling: Secondary | ICD-10-CM | POA: Diagnosis not present

## 2022-01-24 DIAGNOSIS — E785 Hyperlipidemia, unspecified: Secondary | ICD-10-CM | POA: Diagnosis not present

## 2022-01-24 DIAGNOSIS — I509 Heart failure, unspecified: Secondary | ICD-10-CM | POA: Diagnosis not present

## 2022-01-24 DIAGNOSIS — B9620 Unspecified Escherichia coli [E. coli] as the cause of diseases classified elsewhere: Secondary | ICD-10-CM | POA: Diagnosis not present

## 2022-01-24 DIAGNOSIS — F17210 Nicotine dependence, cigarettes, uncomplicated: Secondary | ICD-10-CM | POA: Diagnosis not present

## 2022-01-24 DIAGNOSIS — Z7982 Long term (current) use of aspirin: Secondary | ICD-10-CM | POA: Diagnosis not present

## 2022-01-24 DIAGNOSIS — I214 Non-ST elevation (NSTEMI) myocardial infarction: Secondary | ICD-10-CM | POA: Diagnosis not present

## 2022-01-24 DIAGNOSIS — F32A Depression, unspecified: Secondary | ICD-10-CM | POA: Diagnosis not present

## 2022-01-24 DIAGNOSIS — J189 Pneumonia, unspecified organism: Secondary | ICD-10-CM | POA: Diagnosis not present

## 2022-01-24 DIAGNOSIS — Z7902 Long term (current) use of antithrombotics/antiplatelets: Secondary | ICD-10-CM | POA: Diagnosis not present

## 2022-01-24 DIAGNOSIS — Z8674 Personal history of sudden cardiac arrest: Secondary | ICD-10-CM | POA: Diagnosis not present

## 2022-01-24 DIAGNOSIS — K219 Gastro-esophageal reflux disease without esophagitis: Secondary | ICD-10-CM | POA: Diagnosis not present

## 2022-01-24 DIAGNOSIS — I110 Hypertensive heart disease with heart failure: Secondary | ICD-10-CM | POA: Diagnosis not present

## 2022-01-24 DIAGNOSIS — R7881 Bacteremia: Secondary | ICD-10-CM | POA: Diagnosis not present

## 2022-01-27 DIAGNOSIS — J189 Pneumonia, unspecified organism: Secondary | ICD-10-CM | POA: Diagnosis not present

## 2022-01-27 DIAGNOSIS — J9622 Acute and chronic respiratory failure with hypercapnia: Secondary | ICD-10-CM | POA: Diagnosis not present

## 2022-01-27 DIAGNOSIS — I214 Non-ST elevation (NSTEMI) myocardial infarction: Secondary | ICD-10-CM | POA: Diagnosis not present

## 2022-01-27 DIAGNOSIS — Z7982 Long term (current) use of aspirin: Secondary | ICD-10-CM | POA: Diagnosis not present

## 2022-01-27 DIAGNOSIS — E785 Hyperlipidemia, unspecified: Secondary | ICD-10-CM | POA: Diagnosis not present

## 2022-01-27 DIAGNOSIS — R7881 Bacteremia: Secondary | ICD-10-CM | POA: Diagnosis not present

## 2022-01-27 DIAGNOSIS — J440 Chronic obstructive pulmonary disease with acute lower respiratory infection: Secondary | ICD-10-CM | POA: Diagnosis not present

## 2022-01-27 DIAGNOSIS — F17210 Nicotine dependence, cigarettes, uncomplicated: Secondary | ICD-10-CM | POA: Diagnosis not present

## 2022-01-27 DIAGNOSIS — N390 Urinary tract infection, site not specified: Secondary | ICD-10-CM | POA: Diagnosis not present

## 2022-01-27 DIAGNOSIS — Z452 Encounter for adjustment and management of vascular access device: Secondary | ICD-10-CM | POA: Diagnosis not present

## 2022-01-27 DIAGNOSIS — F32A Depression, unspecified: Secondary | ICD-10-CM | POA: Diagnosis not present

## 2022-01-27 DIAGNOSIS — Z9181 History of falling: Secondary | ICD-10-CM | POA: Diagnosis not present

## 2022-01-27 DIAGNOSIS — K219 Gastro-esophageal reflux disease without esophagitis: Secondary | ICD-10-CM | POA: Diagnosis not present

## 2022-01-27 DIAGNOSIS — I509 Heart failure, unspecified: Secondary | ICD-10-CM | POA: Diagnosis not present

## 2022-01-27 DIAGNOSIS — I110 Hypertensive heart disease with heart failure: Secondary | ICD-10-CM | POA: Diagnosis not present

## 2022-01-27 DIAGNOSIS — B9620 Unspecified Escherichia coli [E. coli] as the cause of diseases classified elsewhere: Secondary | ICD-10-CM | POA: Diagnosis not present

## 2022-01-27 DIAGNOSIS — Z7902 Long term (current) use of antithrombotics/antiplatelets: Secondary | ICD-10-CM | POA: Diagnosis not present

## 2022-01-27 DIAGNOSIS — M069 Rheumatoid arthritis, unspecified: Secondary | ICD-10-CM | POA: Diagnosis not present

## 2022-01-27 DIAGNOSIS — Z8674 Personal history of sudden cardiac arrest: Secondary | ICD-10-CM | POA: Diagnosis not present

## 2022-01-27 DIAGNOSIS — J9621 Acute and chronic respiratory failure with hypoxia: Secondary | ICD-10-CM | POA: Diagnosis not present

## 2022-01-30 DIAGNOSIS — J90 Pleural effusion, not elsewhere classified: Secondary | ICD-10-CM | POA: Diagnosis not present

## 2022-01-30 DIAGNOSIS — Z6833 Body mass index (BMI) 33.0-33.9, adult: Secondary | ICD-10-CM | POA: Diagnosis not present

## 2022-01-30 DIAGNOSIS — D649 Anemia, unspecified: Secondary | ICD-10-CM | POA: Diagnosis not present

## 2022-01-30 DIAGNOSIS — I700 Atherosclerosis of aorta: Secondary | ICD-10-CM | POA: Diagnosis not present

## 2022-01-30 DIAGNOSIS — J984 Other disorders of lung: Secondary | ICD-10-CM | POA: Diagnosis not present

## 2022-01-30 DIAGNOSIS — R0781 Pleurodynia: Secondary | ICD-10-CM | POA: Diagnosis not present

## 2022-01-30 DIAGNOSIS — Z79899 Other long term (current) drug therapy: Secondary | ICD-10-CM | POA: Diagnosis not present

## 2022-01-30 DIAGNOSIS — S2242XA Multiple fractures of ribs, left side, initial encounter for closed fracture: Secondary | ICD-10-CM | POA: Diagnosis not present

## 2022-01-30 DIAGNOSIS — I252 Old myocardial infarction: Secondary | ICD-10-CM | POA: Diagnosis not present

## 2022-01-30 DIAGNOSIS — Z09 Encounter for follow-up examination after completed treatment for conditions other than malignant neoplasm: Secondary | ICD-10-CM | POA: Diagnosis not present

## 2022-01-31 ENCOUNTER — Ambulatory Visit: Payer: Self-pay | Admitting: *Deleted

## 2022-01-31 DIAGNOSIS — J189 Pneumonia, unspecified organism: Secondary | ICD-10-CM | POA: Diagnosis not present

## 2022-01-31 DIAGNOSIS — N390 Urinary tract infection, site not specified: Secondary | ICD-10-CM | POA: Diagnosis not present

## 2022-01-31 DIAGNOSIS — B9620 Unspecified Escherichia coli [E. coli] as the cause of diseases classified elsewhere: Secondary | ICD-10-CM | POA: Diagnosis not present

## 2022-01-31 DIAGNOSIS — Z8674 Personal history of sudden cardiac arrest: Secondary | ICD-10-CM | POA: Diagnosis not present

## 2022-01-31 DIAGNOSIS — I110 Hypertensive heart disease with heart failure: Secondary | ICD-10-CM | POA: Diagnosis not present

## 2022-01-31 DIAGNOSIS — Z9181 History of falling: Secondary | ICD-10-CM | POA: Diagnosis not present

## 2022-01-31 DIAGNOSIS — Z7902 Long term (current) use of antithrombotics/antiplatelets: Secondary | ICD-10-CM | POA: Diagnosis not present

## 2022-01-31 DIAGNOSIS — J9622 Acute and chronic respiratory failure with hypercapnia: Secondary | ICD-10-CM | POA: Diagnosis not present

## 2022-01-31 DIAGNOSIS — Z7982 Long term (current) use of aspirin: Secondary | ICD-10-CM | POA: Diagnosis not present

## 2022-01-31 DIAGNOSIS — I509 Heart failure, unspecified: Secondary | ICD-10-CM | POA: Diagnosis not present

## 2022-01-31 DIAGNOSIS — F32A Depression, unspecified: Secondary | ICD-10-CM | POA: Diagnosis not present

## 2022-01-31 DIAGNOSIS — M069 Rheumatoid arthritis, unspecified: Secondary | ICD-10-CM | POA: Diagnosis not present

## 2022-01-31 DIAGNOSIS — I214 Non-ST elevation (NSTEMI) myocardial infarction: Secondary | ICD-10-CM | POA: Diagnosis not present

## 2022-01-31 DIAGNOSIS — Z452 Encounter for adjustment and management of vascular access device: Secondary | ICD-10-CM | POA: Diagnosis not present

## 2022-01-31 DIAGNOSIS — F17210 Nicotine dependence, cigarettes, uncomplicated: Secondary | ICD-10-CM | POA: Diagnosis not present

## 2022-01-31 DIAGNOSIS — E785 Hyperlipidemia, unspecified: Secondary | ICD-10-CM | POA: Diagnosis not present

## 2022-01-31 DIAGNOSIS — R7881 Bacteremia: Secondary | ICD-10-CM | POA: Diagnosis not present

## 2022-01-31 DIAGNOSIS — J9621 Acute and chronic respiratory failure with hypoxia: Secondary | ICD-10-CM | POA: Diagnosis not present

## 2022-01-31 DIAGNOSIS — K219 Gastro-esophageal reflux disease without esophagitis: Secondary | ICD-10-CM | POA: Diagnosis not present

## 2022-01-31 DIAGNOSIS — J440 Chronic obstructive pulmonary disease with acute lower respiratory infection: Secondary | ICD-10-CM | POA: Diagnosis not present

## 2022-01-31 NOTE — Patient Outreach (Signed)
Care Coordination   Follow Up Visit Note   01/31/2022 Name: Nathan Nicholson MRN: [id number] DOB: 08/08/1955  Nathan Nicholson is a 66 y.o. year old male who sees Potts, Johnney Killian, NP for primary care. I spoke with  Nathan Nicholson by phone today.  What matters to the patients health and wellness today?  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 . Pt was taken to [facility] and then airlifted to [facility] in [place]. He had angioplasty and was eventually discharged home on 01/23/22. Home Health care has been started for nursing and PT. He has the support of his wife for all his needs.  Patient was recently discharged from hospital and all medications have been reviewed. Outpatient Encounter Medications as of 01/31/2022  Medication Sig   acetaminophen (TYLENOL) 500 MG tablet Take 1,000 mg by mouth every 6 (six) hours as needed for moderate pain.   albuterol (VENTOLIN HFA) 108 (90 Base) MCG/ACT inhaler Inhale 2 puffs into the lungs every 4 (four) hours as needed (Breathing).   alendronate (FOSAMAX) 70 MG tablet Take 70 mg by mouth every Monday. Take with a full glass of water on an empty stomach.   aspirin 81 MG EC tablet Take 81 mg by mouth every morning.   atorvastatin (LIPITOR) 10 MG tablet Take 10 mg by mouth every morning.   budesonide (PULMICORT) 0.5 MG/2ML nebulizer solution USE 1 VIAL  IN  NEBULIZER TWICE  DAILY - Rinse Mouth Out After Each Treatment   clopidogrel (PLAVIX) 75 MG tablet Take 75 mg by mouth daily.   cyanocobalamin (,VITAMIN B-12,) 1000 MCG/ML injection Inject 1,000 mcg into the muscle every 30 (thirty) days.   escitalopram (LEXAPRO) 10 MG tablet Take 10 mg by mouth at bedtime.   ferrous sulfate 325 (65 FE) MG tablet Take 325 mg by mouth daily.   folic acid (FOLVITE) 1 MG tablet Take 1 mg by mouth every morning.   ipratropium-albuterol (DUONEB) 0.5-2.5 (3) MG/3ML SOLN USE 1 VIAL IN NEBULIZER 3 TIMES DAILY   meloxicam (MOBIC) 15 MG tablet Take 15 mg by mouth daily.    nebivolol (BYSTOLIC) 10 MG tablet Take 10 mg by mouth daily.   nitroGLYCERIN (NITROSTAT) 0.4 MG SL tablet Place 0.4 mg under the tongue every 5 (five) minutes as needed for chest pain. Up to 3 doses.   OXYGEN Inhale 2 L into the lungs continuous.   pantoprazole (PROTONIX) 40 MG tablet Take 40 mg by mouth daily.   Skin Protectants, Misc. (EUCERIN) cream Apply 1 application. topically daily.   Vitamin D, Ergocalciferol, (DRISDOL) 1.25 MG (50000 UNIT) CAPS capsule Take 50,000 Units by mouth every Monday.   docusate sodium (COLACE) 100 MG capsule Take 100 mg by mouth daily. (Patient not taking: Reported on 01/31/2022)   methotrexate (RHEUMATREX) 2.5 MG tablet Take 10 mg by mouth every Tuesday. Caution:Chemotherapy. Protect from light. (Patient not taking: Reported on 01/31/2022)   senna-docusate (SENOKOT-S) 8.6-50 MG tablet Take 1 tablet by mouth 2 (two) times daily as needed for moderate constipation. (Patient not taking: Reported on 09/30/2021)   No facility-administered encounter medications on file as of 01/31/2022.    Goals Addressed             This Visit's Progress    Recover from MI (12/26/21) over the next 90 days       Care Coordination Interventions: Reviewed Importance of taking all medications as prescribed Reviewed Importance of attending all scheduled provider appointments  Pt has new meds. See  list. He has seen his PCP and will f/u with [facility] on 12/4.         SDOH assessments and interventions completed:  Yes Previously Assessed.   Care Coordination Interventions:  Yes, provided   Follow up plan: Follow up call scheduled for 1 week.    Encounter Outcome:  Pt. Visit Completed   Advised pt or wife to call NP, MD for any questions or problems promptly to avoid complications.  Zara Council. Burgess Estelle, MSN, [facility] Gerontological Nurse Practitioner [facility] Care Management [phone]

## 2022-02-06 DIAGNOSIS — J441 Chronic obstructive pulmonary disease with (acute) exacerbation: Secondary | ICD-10-CM | POA: Diagnosis not present

## 2022-02-06 DIAGNOSIS — I2510 Atherosclerotic heart disease of native coronary artery without angina pectoris: Secondary | ICD-10-CM | POA: Diagnosis not present

## 2022-02-06 DIAGNOSIS — I255 Ischemic cardiomyopathy: Secondary | ICD-10-CM | POA: Diagnosis not present

## 2022-02-06 DIAGNOSIS — J9601 Acute respiratory failure with hypoxia: Secondary | ICD-10-CM | POA: Diagnosis not present

## 2022-02-06 DIAGNOSIS — I10 Essential (primary) hypertension: Secondary | ICD-10-CM | POA: Diagnosis not present

## 2022-02-06 DIAGNOSIS — Z720 Tobacco use: Secondary | ICD-10-CM | POA: Diagnosis not present

## 2022-02-06 DIAGNOSIS — J449 Chronic obstructive pulmonary disease, unspecified: Secondary | ICD-10-CM | POA: Diagnosis not present

## 2022-02-06 DIAGNOSIS — E785 Hyperlipidemia, unspecified: Secondary | ICD-10-CM | POA: Diagnosis not present

## 2022-02-07 ENCOUNTER — Telehealth: Payer: Self-pay | Admitting: *Deleted

## 2022-02-07 ENCOUNTER — Encounter: Payer: Self-pay | Admitting: *Deleted

## 2022-02-07 DIAGNOSIS — D72829 Elevated white blood cell count, unspecified: Secondary | ICD-10-CM | POA: Diagnosis not present

## 2022-02-07 NOTE — Patient Outreach (Signed)
  Care Coordination   Follow Up Visit Note   02/07/2022 Name: Nathan Nicholson MRN: [id number] DOB: 1956/01/30  Nathan Nicholson is a 66 y.o. year old male who sees Potts, Johnney Killian, NP for primary care. I spoke with  Nathan Nicholson by phone today.  What matters to the patients health and wellness today?  CONTINUE RECOVERY FROM MI.    Goals Addressed             This Visit's Progress    Recover from MI (12/26/21) over the next 90 days   On track    Care Coordination Interventions: Provided education on Importance of limiting foods high in cholesterol Reviewed Importance of taking all medications as prescribed Reviewed Importance of attending all scheduled provider appointments Advised to report any changes in symptoms or exercise tolerance Screening for signs and symptoms of depression related to chronic disease state  PATIENT HAS HAD HIS F/U CARDIOVASCULAR EVALUATION. NO CHANGES TO HIS MEDICAL REGIMEN. HE HAS ALL HIS MEDS. HIS APPETITE IS FAIR AND HE IS STICKING TO A HEART HEALTHY DIET. H DENIES ANY DEPRESSION. NO FALLS. NO CP OR SOB.        SDOH assessments and interventions completed:  Yes  PREVIOUSLY COMPLETED.   Care Coordination Interventions:  Yes, provided   Follow up plan: Follow up call scheduled for 2 WEEKS.    Encounter Outcome:  Pt. Visit Completed   Noralyn Pick C. Burgess Estelle, MSN, [facility] Gerontological Nurse Practitioner [facility] Care Management [phone]

## 2022-02-08 DIAGNOSIS — J449 Chronic obstructive pulmonary disease, unspecified: Secondary | ICD-10-CM | POA: Diagnosis not present

## 2022-02-08 DIAGNOSIS — J441 Chronic obstructive pulmonary disease with (acute) exacerbation: Secondary | ICD-10-CM | POA: Diagnosis not present

## 2022-02-13 DIAGNOSIS — E7800 Pure hypercholesterolemia, unspecified: Secondary | ICD-10-CM | POA: Diagnosis not present

## 2022-02-13 DIAGNOSIS — J441 Chronic obstructive pulmonary disease with (acute) exacerbation: Secondary | ICD-10-CM | POA: Diagnosis not present

## 2022-02-13 DIAGNOSIS — Z955 Presence of coronary angioplasty implant and graft: Secondary | ICD-10-CM | POA: Diagnosis not present

## 2022-02-13 DIAGNOSIS — E119 Type 2 diabetes mellitus without complications: Secondary | ICD-10-CM | POA: Diagnosis not present

## 2022-02-13 DIAGNOSIS — J209 Acute bronchitis, unspecified: Secondary | ICD-10-CM | POA: Diagnosis not present

## 2022-02-13 DIAGNOSIS — J9622 Acute and chronic respiratory failure with hypercapnia: Secondary | ICD-10-CM | POA: Diagnosis not present

## 2022-02-13 DIAGNOSIS — I2510 Atherosclerotic heart disease of native coronary artery without angina pectoris: Secondary | ICD-10-CM | POA: Diagnosis not present

## 2022-02-13 DIAGNOSIS — R9431 Abnormal electrocardiogram [ECG] [EKG]: Secondary | ICD-10-CM | POA: Diagnosis not present

## 2022-02-13 DIAGNOSIS — M069 Rheumatoid arthritis, unspecified: Secondary | ICD-10-CM | POA: Diagnosis not present

## 2022-02-13 DIAGNOSIS — R5381 Other malaise: Secondary | ICD-10-CM | POA: Diagnosis not present

## 2022-02-13 DIAGNOSIS — K219 Gastro-esophageal reflux disease without esophagitis: Secondary | ICD-10-CM | POA: Diagnosis not present

## 2022-02-13 DIAGNOSIS — E876 Hypokalemia: Secondary | ICD-10-CM | POA: Diagnosis not present

## 2022-02-13 DIAGNOSIS — Z8701 Personal history of pneumonia (recurrent): Secondary | ICD-10-CM | POA: Diagnosis not present

## 2022-02-13 DIAGNOSIS — F17210 Nicotine dependence, cigarettes, uncomplicated: Secondary | ICD-10-CM | POA: Diagnosis not present

## 2022-02-13 DIAGNOSIS — G928 Other toxic encephalopathy: Secondary | ICD-10-CM | POA: Diagnosis not present

## 2022-02-13 DIAGNOSIS — I110 Hypertensive heart disease with heart failure: Secondary | ICD-10-CM | POA: Diagnosis not present

## 2022-02-13 DIAGNOSIS — I252 Old myocardial infarction: Secondary | ICD-10-CM | POA: Diagnosis not present

## 2022-02-13 DIAGNOSIS — Z87440 Personal history of urinary (tract) infections: Secondary | ICD-10-CM | POA: Diagnosis not present

## 2022-02-13 DIAGNOSIS — J189 Pneumonia, unspecified organism: Secondary | ICD-10-CM | POA: Diagnosis not present

## 2022-02-13 DIAGNOSIS — R0602 Shortness of breath: Secondary | ICD-10-CM | POA: Diagnosis not present

## 2022-02-13 DIAGNOSIS — Z9081 Acquired absence of spleen: Secondary | ICD-10-CM | POA: Diagnosis not present

## 2022-02-13 DIAGNOSIS — J9621 Acute and chronic respiratory failure with hypoxia: Secondary | ICD-10-CM | POA: Diagnosis not present

## 2022-02-13 DIAGNOSIS — D849 Immunodeficiency, unspecified: Secondary | ICD-10-CM | POA: Diagnosis not present

## 2022-02-13 DIAGNOSIS — R778 Other specified abnormalities of plasma proteins: Secondary | ICD-10-CM | POA: Diagnosis not present

## 2022-02-13 DIAGNOSIS — I509 Heart failure, unspecified: Secondary | ICD-10-CM | POA: Diagnosis not present

## 2022-02-13 DIAGNOSIS — Z9981 Dependence on supplemental oxygen: Secondary | ICD-10-CM | POA: Diagnosis not present

## 2022-02-13 DIAGNOSIS — J440 Chronic obstructive pulmonary disease with acute lower respiratory infection: Secondary | ICD-10-CM | POA: Diagnosis not present

## 2022-02-13 DIAGNOSIS — J449 Chronic obstructive pulmonary disease, unspecified: Secondary | ICD-10-CM | POA: Diagnosis not present

## 2022-02-13 DIAGNOSIS — R531 Weakness: Secondary | ICD-10-CM | POA: Diagnosis not present

## 2022-02-13 DIAGNOSIS — M6281 Muscle weakness (generalized): Secondary | ICD-10-CM | POA: Diagnosis not present

## 2022-02-14 DIAGNOSIS — R0602 Shortness of breath: Secondary | ICD-10-CM

## 2022-02-18 DIAGNOSIS — J449 Chronic obstructive pulmonary disease, unspecified: Secondary | ICD-10-CM | POA: Diagnosis not present

## 2022-02-21 ENCOUNTER — Ambulatory Visit: Payer: Self-pay | Admitting: *Deleted

## 2022-02-21 ENCOUNTER — Telehealth: Payer: Self-pay | Admitting: *Deleted

## 2022-02-21 NOTE — Patient Outreach (Signed)
  Care Coordination   Follow Up Visit Note   02/21/2022 Name: Nathan Nicholson MRN: [id number] DOB: Jul 24, 1955  Nathan Nicholson is a 66 y.o. year old male who sees Potts, Johnney Killian, NP for primary care. I spoke with  Nathan Nicholson by phone today.  What matters to the patients health and wellness today?  Continuing recover phase post MI.    Goals Addressed             This Visit's Progress    Recover from MI (12/26/21) over the next 90 days       Care Coordination Interventions: Provided education on Importance of limiting foods high in cholesterol Counseled on the importance of exercise goals with target of 150 minutes per week Reviewed Importance of attending all scheduled provider appointments         SDOH assessments and interventions completed:  Yes   PREVIOUSLY ADDRESSED.  Care Coordination Interventions:  Yes, provided    Follow up plan: Follow up call scheduled for 1 WEEK.    Encounter Outcome:  Pt. Visit Completed

## 2022-02-23 DIAGNOSIS — D72829 Elevated white blood cell count, unspecified: Secondary | ICD-10-CM | POA: Diagnosis not present

## 2022-02-28 DIAGNOSIS — D649 Anemia, unspecified: Secondary | ICD-10-CM | POA: Diagnosis not present

## 2022-02-28 DIAGNOSIS — Z79899 Other long term (current) drug therapy: Secondary | ICD-10-CM | POA: Diagnosis not present

## 2022-02-28 DIAGNOSIS — E876 Hypokalemia: Secondary | ICD-10-CM | POA: Diagnosis not present

## 2022-02-28 DIAGNOSIS — E1169 Type 2 diabetes mellitus with other specified complication: Secondary | ICD-10-CM | POA: Diagnosis not present

## 2022-02-28 DIAGNOSIS — I10 Essential (primary) hypertension: Secondary | ICD-10-CM | POA: Diagnosis not present

## 2022-02-28 DIAGNOSIS — Z6831 Body mass index (BMI) 31.0-31.9, adult: Secondary | ICD-10-CM | POA: Diagnosis not present

## 2022-02-28 DIAGNOSIS — J189 Pneumonia, unspecified organism: Secondary | ICD-10-CM | POA: Diagnosis not present

## 2022-03-02 ENCOUNTER — Ambulatory Visit: Payer: Self-pay | Admitting: *Deleted

## 2022-03-02 ENCOUNTER — Encounter: Payer: Self-pay | Admitting: *Deleted

## 2022-03-02 ENCOUNTER — Telehealth: Payer: Self-pay | Admitting: *Deleted

## 2022-03-02 NOTE — Patient Outreach (Signed)
  Care Coordination   Follow Up Visit Note   03/02/2022 Name: [name] MRN: [id number] DOB: 1955/09/18  Nathan Nicholson is a 66 y.o. year old male who sees Potts, Johnney Killian, NP for primary care. I spoke with  Nathan Nicholson by phone today.  What matters to the patients health and wellness today?  CONTINUE TO FOLLOW MEDICAL ADVICE.    Goals Addressed             This Visit's Progress    Recover from MI (12/26/21) over the next 90 days   On track    Care Coordination Interventions: Provided education on importance of blood pressure control in management of CAD Provided education on Importance of limiting foods high in cholesterol Counseled on the importance of exercise goals with target of 150 minutes per week Advised patient to discuss CARDIAC REHAB  with provider ADVISED PT TO REPORT A WEEKS WORTH OF BP'S ON NEXT CALL WITH HOPEFUL GOAL OF BP<130/80.           PATIENT REPORTS HIS BP IS 145/94 TODAY AND HIS WT IS 170#  SDOH assessments and interventions completed:  Yes  PREVIOUSLY ADDRESSED.   Care Coordination Interventions:  Yes, provided   Follow up plan: Follow up call scheduled for 1 MONTH    Encounter Outcome:  Pt. Visit Completed   Noralyn Pick C. Burgess Estelle, MSN, [facility] Gerontological Nurse Practitioner [facility] Care Management [phone]

## 2022-03-02 NOTE — Patient Outreach (Signed)
  Care Coordination   Follow Up Visit Note   03/02/2022 Name: Nathan Nicholson MRN: [id number] DOB: 23-Dec-1955  Nathan Nicholson is a 66 y.o. year old male who sees Potts, Johnney Killian, NP for primary care. I spoke with  Nathan Nicholson by phone today.  What matters to the patients health and wellness today?  FOLLOW MD ORDERS AND WELCOME IN 2024.    Goals Addressed             This Visit's Progress    Recover from MI (12/26/21) over the next 90 days       Care Coordination Interventions: Provided education on importance of blood pressure control in management of CAD Provided education on Importance of limiting foods high in cholesterol Counseled on the importance of exercise goals with target of 150 minutes per week Advised patient to discuss CARDIAC REHAB  with provider ADVISED PT TO REPORT A WEEKS WORTH OF BP'S ON NEXT CALL WITH HOPEFUL GOAL OF BP<130/80.           SDOH assessments and interventions completed:  Yes    PREVIOUSLY ADDRESSED.  Care Coordination Interventions:  Yes, provided   Follow up plan: Follow up call scheduled for JANUARY    Encounter Outcome:  Pt. Visit Completed   Zara Council. Burgess Estelle, MSN, [facility] Gerontological Nurse Practitioner [facility] Care Management [phone]

## 2022-03-07 DIAGNOSIS — J441 Chronic obstructive pulmonary disease with (acute) exacerbation: Secondary | ICD-10-CM | POA: Diagnosis not present

## 2022-03-07 DIAGNOSIS — J449 Chronic obstructive pulmonary disease, unspecified: Secondary | ICD-10-CM | POA: Diagnosis not present

## 2022-03-09 DIAGNOSIS — J441 Chronic obstructive pulmonary disease with (acute) exacerbation: Secondary | ICD-10-CM | POA: Diagnosis not present

## 2022-03-09 DIAGNOSIS — J9601 Acute respiratory failure with hypoxia: Secondary | ICD-10-CM | POA: Diagnosis not present

## 2022-03-09 DIAGNOSIS — J449 Chronic obstructive pulmonary disease, unspecified: Secondary | ICD-10-CM | POA: Diagnosis not present

## 2022-03-21 DIAGNOSIS — J449 Chronic obstructive pulmonary disease, unspecified: Secondary | ICD-10-CM | POA: Diagnosis not present

## 2022-03-22 ENCOUNTER — Other Ambulatory Visit: Payer: Self-pay | Admitting: *Deleted

## 2022-03-22 DIAGNOSIS — Z6830 Body mass index (BMI) 30.0-30.9, adult: Secondary | ICD-10-CM | POA: Diagnosis not present

## 2022-03-22 DIAGNOSIS — I10 Essential (primary) hypertension: Secondary | ICD-10-CM | POA: Diagnosis not present

## 2022-03-22 DIAGNOSIS — Z122 Encounter for screening for malignant neoplasm of respiratory organs: Secondary | ICD-10-CM

## 2022-03-22 DIAGNOSIS — F17210 Nicotine dependence, cigarettes, uncomplicated: Secondary | ICD-10-CM

## 2022-03-22 DIAGNOSIS — Z87891 Personal history of nicotine dependence: Secondary | ICD-10-CM

## 2022-03-29 DIAGNOSIS — J449 Chronic obstructive pulmonary disease, unspecified: Secondary | ICD-10-CM | POA: Diagnosis not present

## 2022-03-29 DIAGNOSIS — J441 Chronic obstructive pulmonary disease with (acute) exacerbation: Secondary | ICD-10-CM | POA: Diagnosis not present

## 2022-04-03 ENCOUNTER — Ambulatory Visit: Payer: Self-pay | Admitting: *Deleted

## 2022-04-03 NOTE — Patient Outreach (Unsigned)
1 {CARE COORDINATION NOTES:27886},  140/87, 150/92, 151/86, 140/89, 144/92  109  170.6#, 5'3"

## 2022-04-09 DIAGNOSIS — J441 Chronic obstructive pulmonary disease with (acute) exacerbation: Secondary | ICD-10-CM | POA: Diagnosis not present

## 2022-04-09 DIAGNOSIS — J9601 Acute respiratory failure with hypoxia: Secondary | ICD-10-CM | POA: Diagnosis not present

## 2022-04-09 DIAGNOSIS — J449 Chronic obstructive pulmonary disease, unspecified: Secondary | ICD-10-CM | POA: Diagnosis not present

## 2022-04-19 DIAGNOSIS — M8080XD Other osteoporosis with current pathological fracture, unspecified site, subsequent encounter for fracture with routine healing: Secondary | ICD-10-CM | POA: Diagnosis not present

## 2022-04-19 DIAGNOSIS — E669 Obesity, unspecified: Secondary | ICD-10-CM | POA: Diagnosis not present

## 2022-04-19 DIAGNOSIS — M1991 Primary osteoarthritis, unspecified site: Secondary | ICD-10-CM | POA: Diagnosis not present

## 2022-04-19 DIAGNOSIS — M25561 Pain in right knee: Secondary | ICD-10-CM | POA: Diagnosis not present

## 2022-04-19 DIAGNOSIS — M0579 Rheumatoid arthritis with rheumatoid factor of multiple sites without organ or systems involvement: Secondary | ICD-10-CM | POA: Diagnosis not present

## 2022-04-19 DIAGNOSIS — Z6831 Body mass index (BMI) 31.0-31.9, adult: Secondary | ICD-10-CM | POA: Diagnosis not present

## 2022-04-19 DIAGNOSIS — M545 Low back pain, unspecified: Secondary | ICD-10-CM | POA: Diagnosis not present

## 2022-04-21 DIAGNOSIS — J449 Chronic obstructive pulmonary disease, unspecified: Secondary | ICD-10-CM | POA: Diagnosis not present

## 2022-04-24 DIAGNOSIS — I2510 Atherosclerotic heart disease of native coronary artery without angina pectoris: Secondary | ICD-10-CM | POA: Diagnosis not present

## 2022-04-25 DIAGNOSIS — J449 Chronic obstructive pulmonary disease, unspecified: Secondary | ICD-10-CM | POA: Diagnosis not present

## 2022-04-25 DIAGNOSIS — J441 Chronic obstructive pulmonary disease with (acute) exacerbation: Secondary | ICD-10-CM | POA: Diagnosis not present

## 2022-05-01 ENCOUNTER — Telehealth: Payer: Self-pay | Admitting: *Deleted

## 2022-05-01 ENCOUNTER — Encounter: Payer: Self-pay | Admitting: *Deleted

## 2022-05-01 NOTE — Patient Outreach (Signed)
  Care Coordination   Follow Up Visit Note (Discharged)   05/01/2022 Name: Nathan Nicholson MRN: [id number] DOB: May 16, 1955  Nathan Nicholson is a 67 y.o. year old male who sees Potts, Georgeann Oppenheim, NP for primary care. I spoke with  Nathan Nicholson by phone today.  What matters to the patients health and wellness today?  Preventing complications    Goals Addressed             This Visit's Progress    COMPLETED: Recover from MI (12/26/21) over the next 90 days       Interventions Today    Flowsheet Row Most Recent Value  Chronic Disease   Chronic disease during today's visit Other, Diabetes, Chronic Obstructive Pulmonary Disease (COPD), Hypertension (HTN)  [CAD (MI in Oct 2023)]  General Interventions   General Interventions Discussed/Reviewed Labs, Annual Foot Exam, Lipid Profile, Doctor Visits, Communication with  Labs Hgb A1c annually  [Last value was 5.2 in Oct. 2023]  Doctor Visits Discussed/Reviewed Doctor Visits Discussed, Annual Wellness Visits  Exercise Interventions   Exercise Discussed/Reviewed Exercise Discussed  Nutrition Interventions   Nutrition Discussed/Reviewed Nutrition Discussed, Nutrition Reviewed, Carbohydrate meal planning              SDOH assessments and interventions completed:  Yes  SDOH Interventions Today    Flowsheet Row Most Recent Value  SDOH Interventions   Utilities Interventions Intervention Not Indicated  Social Connections Interventions Intervention Not Indicated        Care Coordination Interventions:  Yes, provided   Follow up plan: No further intervention required.   Encounter Outcome:  Pt. Visit Completed   [name] C. [name], MSN, [facility] Gerontological Nurse Practitioner [facility] Care Management [phone]

## 2022-05-08 DIAGNOSIS — J9601 Acute respiratory failure with hypoxia: Secondary | ICD-10-CM | POA: Diagnosis not present

## 2022-05-08 DIAGNOSIS — J449 Chronic obstructive pulmonary disease, unspecified: Secondary | ICD-10-CM | POA: Diagnosis not present

## 2022-05-08 DIAGNOSIS — J441 Chronic obstructive pulmonary disease with (acute) exacerbation: Secondary | ICD-10-CM | POA: Diagnosis not present

## 2022-05-19 DIAGNOSIS — J441 Chronic obstructive pulmonary disease with (acute) exacerbation: Secondary | ICD-10-CM | POA: Diagnosis not present

## 2022-05-19 DIAGNOSIS — E785 Hyperlipidemia, unspecified: Secondary | ICD-10-CM | POA: Diagnosis not present

## 2022-05-19 DIAGNOSIS — R69 Illness, unspecified: Secondary | ICD-10-CM | POA: Diagnosis not present

## 2022-05-19 DIAGNOSIS — I10 Essential (primary) hypertension: Secondary | ICD-10-CM | POA: Diagnosis not present

## 2022-05-19 DIAGNOSIS — F17200 Nicotine dependence, unspecified, uncomplicated: Secondary | ICD-10-CM | POA: Diagnosis not present

## 2022-05-19 DIAGNOSIS — J449 Chronic obstructive pulmonary disease, unspecified: Secondary | ICD-10-CM | POA: Diagnosis not present

## 2022-05-19 DIAGNOSIS — I255 Ischemic cardiomyopathy: Secondary | ICD-10-CM | POA: Diagnosis not present

## 2022-05-19 DIAGNOSIS — I2510 Atherosclerotic heart disease of native coronary artery without angina pectoris: Secondary | ICD-10-CM | POA: Diagnosis not present

## 2022-05-20 DIAGNOSIS — J449 Chronic obstructive pulmonary disease, unspecified: Secondary | ICD-10-CM | POA: Diagnosis not present

## 2022-05-24 DIAGNOSIS — E1169 Type 2 diabetes mellitus with other specified complication: Secondary | ICD-10-CM | POA: Diagnosis not present

## 2022-05-24 DIAGNOSIS — E559 Vitamin D deficiency, unspecified: Secondary | ICD-10-CM | POA: Diagnosis not present

## 2022-05-24 DIAGNOSIS — J449 Chronic obstructive pulmonary disease, unspecified: Secondary | ICD-10-CM | POA: Diagnosis not present

## 2022-05-24 DIAGNOSIS — D649 Anemia, unspecified: Secondary | ICD-10-CM | POA: Diagnosis not present

## 2022-05-24 DIAGNOSIS — E538 Deficiency of other specified B group vitamins: Secondary | ICD-10-CM | POA: Diagnosis not present

## 2022-05-24 DIAGNOSIS — E782 Mixed hyperlipidemia: Secondary | ICD-10-CM | POA: Diagnosis not present

## 2022-06-06 ENCOUNTER — Other Ambulatory Visit: Payer: Self-pay | Admitting: Primary Care

## 2022-06-08 DIAGNOSIS — J9601 Acute respiratory failure with hypoxia: Secondary | ICD-10-CM | POA: Diagnosis not present

## 2022-06-08 DIAGNOSIS — J441 Chronic obstructive pulmonary disease with (acute) exacerbation: Secondary | ICD-10-CM | POA: Diagnosis not present

## 2022-06-08 DIAGNOSIS — J449 Chronic obstructive pulmonary disease, unspecified: Secondary | ICD-10-CM | POA: Diagnosis not present

## 2022-06-09 DIAGNOSIS — J449 Chronic obstructive pulmonary disease, unspecified: Secondary | ICD-10-CM | POA: Diagnosis not present

## 2022-06-16 DIAGNOSIS — I2510 Atherosclerotic heart disease of native coronary artery without angina pectoris: Secondary | ICD-10-CM | POA: Diagnosis not present

## 2022-06-20 DIAGNOSIS — J449 Chronic obstructive pulmonary disease, unspecified: Secondary | ICD-10-CM | POA: Diagnosis not present

## 2022-07-04 DIAGNOSIS — J449 Chronic obstructive pulmonary disease, unspecified: Secondary | ICD-10-CM | POA: Diagnosis not present

## 2022-07-04 DIAGNOSIS — I10 Essential (primary) hypertension: Secondary | ICD-10-CM | POA: Diagnosis not present

## 2022-07-04 DIAGNOSIS — E1169 Type 2 diabetes mellitus with other specified complication: Secondary | ICD-10-CM | POA: Diagnosis not present

## 2022-07-05 DIAGNOSIS — E785 Hyperlipidemia, unspecified: Secondary | ICD-10-CM | POA: Diagnosis not present

## 2022-07-05 DIAGNOSIS — I255 Ischemic cardiomyopathy: Secondary | ICD-10-CM | POA: Diagnosis not present

## 2022-07-05 DIAGNOSIS — J449 Chronic obstructive pulmonary disease, unspecified: Secondary | ICD-10-CM | POA: Diagnosis not present

## 2022-07-05 DIAGNOSIS — I2510 Atherosclerotic heart disease of native coronary artery without angina pectoris: Secondary | ICD-10-CM | POA: Diagnosis not present

## 2022-07-05 DIAGNOSIS — Z720 Tobacco use: Secondary | ICD-10-CM | POA: Diagnosis not present

## 2022-07-05 DIAGNOSIS — I10 Essential (primary) hypertension: Secondary | ICD-10-CM | POA: Diagnosis not present

## 2022-07-08 DIAGNOSIS — J441 Chronic obstructive pulmonary disease with (acute) exacerbation: Secondary | ICD-10-CM | POA: Diagnosis not present

## 2022-07-08 DIAGNOSIS — J449 Chronic obstructive pulmonary disease, unspecified: Secondary | ICD-10-CM | POA: Diagnosis not present

## 2022-07-08 DIAGNOSIS — J9601 Acute respiratory failure with hypoxia: Secondary | ICD-10-CM | POA: Diagnosis not present

## 2022-07-10 DIAGNOSIS — M545 Low back pain, unspecified: Secondary | ICD-10-CM | POA: Diagnosis not present

## 2022-07-10 DIAGNOSIS — E559 Vitamin D deficiency, unspecified: Secondary | ICD-10-CM | POA: Diagnosis not present

## 2022-07-10 DIAGNOSIS — K5900 Constipation, unspecified: Secondary | ICD-10-CM | POA: Diagnosis not present

## 2022-07-10 DIAGNOSIS — I2510 Atherosclerotic heart disease of native coronary artery without angina pectoris: Secondary | ICD-10-CM | POA: Diagnosis not present

## 2022-07-10 DIAGNOSIS — M1990 Unspecified osteoarthritis, unspecified site: Secondary | ICD-10-CM | POA: Diagnosis not present

## 2022-07-10 DIAGNOSIS — I255 Ischemic cardiomyopathy: Secondary | ICD-10-CM | POA: Diagnosis not present

## 2022-07-10 DIAGNOSIS — K219 Gastro-esophageal reflux disease without esophagitis: Secondary | ICD-10-CM | POA: Diagnosis not present

## 2022-07-10 DIAGNOSIS — I509 Heart failure, unspecified: Secondary | ICD-10-CM | POA: Diagnosis not present

## 2022-07-10 DIAGNOSIS — N529 Male erectile dysfunction, unspecified: Secondary | ICD-10-CM | POA: Diagnosis not present

## 2022-07-10 DIAGNOSIS — Z008 Encounter for other general examination: Secondary | ICD-10-CM | POA: Diagnosis not present

## 2022-07-10 DIAGNOSIS — E785 Hyperlipidemia, unspecified: Secondary | ICD-10-CM | POA: Diagnosis not present

## 2022-07-10 DIAGNOSIS — N189 Chronic kidney disease, unspecified: Secondary | ICD-10-CM | POA: Diagnosis not present

## 2022-07-10 DIAGNOSIS — M8580 Other specified disorders of bone density and structure, unspecified site: Secondary | ICD-10-CM | POA: Diagnosis not present

## 2022-07-20 DIAGNOSIS — J449 Chronic obstructive pulmonary disease, unspecified: Secondary | ICD-10-CM | POA: Diagnosis not present

## 2022-08-02 ENCOUNTER — Telehealth: Payer: Self-pay | Admitting: *Deleted

## 2022-08-02 DIAGNOSIS — M0579 Rheumatoid arthritis with rheumatoid factor of multiple sites without organ or systems involvement: Secondary | ICD-10-CM | POA: Diagnosis not present

## 2022-08-02 DIAGNOSIS — M25561 Pain in right knee: Secondary | ICD-10-CM | POA: Diagnosis not present

## 2022-08-02 DIAGNOSIS — E669 Obesity, unspecified: Secondary | ICD-10-CM | POA: Diagnosis not present

## 2022-08-02 DIAGNOSIS — M8080XD Other osteoporosis with current pathological fracture, unspecified site, subsequent encounter for fracture with routine healing: Secondary | ICD-10-CM | POA: Diagnosis not present

## 2022-08-02 DIAGNOSIS — M1991 Primary osteoarthritis, unspecified site: Secondary | ICD-10-CM | POA: Diagnosis not present

## 2022-08-02 DIAGNOSIS — Z6831 Body mass index (BMI) 31.0-31.9, adult: Secondary | ICD-10-CM | POA: Diagnosis not present

## 2022-08-02 DIAGNOSIS — M545 Low back pain, unspecified: Secondary | ICD-10-CM | POA: Diagnosis not present

## 2022-08-02 DIAGNOSIS — J449 Chronic obstructive pulmonary disease, unspecified: Secondary | ICD-10-CM | POA: Diagnosis not present

## 2022-08-02 NOTE — Progress Notes (Signed)
  Care Coordination  Outreach Note  08/02/2022 Name: Nathan Nicholson MRN: [id number] DOB: 08-05-1955   Care Coordination Outreach Attempts: An unsuccessful telephone outreach was attempted today to offer the patient information about available care coordination services.  Follow Up Plan:  Additional outreach attempts will be made to offer the patient care coordination information and services.   Encounter Outcome:  No Answer  Christie Nottingham  Care Coordination Care Guide  Direct Dial: [phone]

## 2022-08-04 NOTE — Progress Notes (Signed)
  Care Coordination   Note   08/04/2022 Name: Nathan Nicholson MRN: [id number] DOB: 1955/09/01  Nathan Nicholson is a 67 y.o. year old male who sees Potts, Johnney Killian, NP for primary care. I reached out to Nathan Nicholson by phone today to offer care coordination services.  [name] was given information about Care Coordination services today including:   The Care Coordination services include support from the care team which includes your Nurse Coordinator, Clinical Social Worker, or Pharmacist.  The Care Coordination team is here to help remove barriers to the health concerns and goals most important to you. Care Coordination services are voluntary, and the patient may decline or stop services at any time by request to their care team member.   Care Coordination Consent Status: Patient agreed to services and verbal consent obtained.   Follow up plan:  Telephone appointment with care coordination team member scheduled for:  08/10/22  Encounter Outcome:  Pt. Scheduled [facility] Coordination Care Guide  Direct Dial: [phone]

## 2022-08-08 DIAGNOSIS — J449 Chronic obstructive pulmonary disease, unspecified: Secondary | ICD-10-CM | POA: Diagnosis not present

## 2022-08-08 DIAGNOSIS — J9601 Acute respiratory failure with hypoxia: Secondary | ICD-10-CM | POA: Diagnosis not present

## 2022-08-08 DIAGNOSIS — J441 Chronic obstructive pulmonary disease with (acute) exacerbation: Secondary | ICD-10-CM | POA: Diagnosis not present

## 2022-08-10 ENCOUNTER — Ambulatory Visit: Payer: Self-pay

## 2022-08-10 NOTE — Patient Outreach (Signed)
  Care Coordination   Initial Visit Note   08/10/2022 Name: Nathan Nicholson MRN: [id number] DOB: 06/02/1955  Nathan Nicholson is a 67 y.o. year old male who sees Potts, Johnney Killian, NP for primary care. I spoke with  Nathan Nicholson by phone today.  What matters to the patients health and wellness today?  Reports that his biggest concern is his heart. MI 01/2022. Patient reports that his COPD is fair.  Reports DM under good control. Last A1c of 5.5.    Reports that he checks his CBG 2 times per week.  Does not weigh daily.  Patient avoids salt.   Denies swelling in legs.   Reports BP checks 2-3 times per week.   Goes to [facility] for cardiology. Saw cardiologist 2 weeks ago.  Denies any additional heart issue since 01/2022.   Wife manages his medications.    Goals Addressed               This Visit's Progress     I am worried about my heart (pt-stated)        Interventions Today    Flowsheet Row Most Recent Value  Chronic Disease   Chronic disease during today's visit Diabetes, Chronic Obstructive Pulmonary Disease (COPD), Congestive Heart Failure (CHF), Hypertension (HTN)  General Interventions   General Interventions Discussed/Reviewed General Interventions Discussed, Durable Medical Equipment (DME), Doctor Visits  Doctor Visits Discussed/Reviewed Doctor Visits Discussed, PCP, Specialist  Durable Medical Equipment (DME) BP Cuff, Glucomoter, Oxygen  PCP/Specialist Visits Compliance with follow-up visit  Exercise Interventions   Exercise Discussed/Reviewed Exercise Discussed  Education Interventions   Education Provided --  [Reviewed heart failure zones and when to call MD for weight gain. Encouraged patient to start weighing daily.]  Nutrition Interventions   Nutrition Discussed/Reviewed Nutrition Discussed, Decreasing salt  Pharmacy Interventions   Pharmacy Dicussed/Reviewed Medications and their functions  Safety Interventions   Safety Discussed/Reviewed Safety Discussed       Provided education on heart failure zones and when to call MD. Reviewed correct way to weigh every am. Reviewed importance of recording daily weights.        SDOH assessments and interventions completed:  Yes  SDOH Interventions Today    Flowsheet Row Most Recent Value  SDOH Interventions   Food Insecurity Interventions Intervention Not Indicated  Housing Interventions Intervention Not Indicated  Transportation Interventions Intervention Not Indicated  Utilities Interventions Intervention Not Indicated  Alcohol Usage Interventions Intervention Not Indicated (Score <7)  Financial Strain Interventions Intervention Not Indicated  Physical Activity Interventions Intervention Not Indicated        Care Coordination Interventions:  Yes, provided   Follow up plan: Follow up call scheduled for 09/11/2022    Encounter Outcome:  Pt. Visit Completed   Rowe Pavy, RN, BSN, CEN [facility] Coordinator [phone]

## 2022-08-16 DIAGNOSIS — I2510 Atherosclerotic heart disease of native coronary artery without angina pectoris: Secondary | ICD-10-CM | POA: Diagnosis not present

## 2022-08-16 DIAGNOSIS — R918 Other nonspecific abnormal finding of lung field: Secondary | ICD-10-CM | POA: Diagnosis not present

## 2022-08-16 DIAGNOSIS — I700 Atherosclerosis of aorta: Secondary | ICD-10-CM | POA: Diagnosis not present

## 2022-08-16 DIAGNOSIS — F17210 Nicotine dependence, cigarettes, uncomplicated: Secondary | ICD-10-CM | POA: Diagnosis not present

## 2022-08-16 DIAGNOSIS — J439 Emphysema, unspecified: Secondary | ICD-10-CM | POA: Diagnosis not present

## 2022-08-16 DIAGNOSIS — Z122 Encounter for screening for malignant neoplasm of respiratory organs: Secondary | ICD-10-CM | POA: Diagnosis not present

## 2022-08-23 ENCOUNTER — Telehealth: Payer: Self-pay | Admitting: Acute Care

## 2022-08-23 DIAGNOSIS — Z122 Encounter for screening for malignant neoplasm of respiratory organs: Secondary | ICD-10-CM

## 2022-08-23 DIAGNOSIS — F17210 Nicotine dependence, cigarettes, uncomplicated: Secondary | ICD-10-CM

## 2022-08-23 DIAGNOSIS — Z87891 Personal history of nicotine dependence: Secondary | ICD-10-CM

## 2022-08-23 NOTE — Telephone Encounter (Signed)
I have attempted to call the patient with the results of their  Low Dose CT Chest Lung cancer screening scan. There was no answer. I have left a HIPPA compliant VM requesting the patient call the office for the scan results. I included the office contact information in the message. We will await his return call. If no return call we will continue to call until patient is contacted.    Ladies, my plan is to evaluate whether or not patient has signs or symptoms of pneumonia.  If he does not feel he has better then he will need to have follow-up with Dr. Francine Graven, Buelah Manis, or myself in the office to evaluate. Based on the read from radiology, lung RADS 2, based on whether or not he is symptomatic we will do a 6-month or a 39-month follow-up low-dose screening CT. Patient does have aortic atherosclerosis and coronary artery atherosclerosis, we need to make sure he is followed by his PCP for this or if not then he needs to be referred to cardiology. Thanks so much

## 2022-08-23 NOTE — Telephone Encounter (Signed)
I received a return call from patient and his wife.  I explained that the patient's low-dose CT scan that was done at [facility] was read as a lung RADS 2.  The ill-defined nodularity at the site of the previous pneumonia could represent postinfectious scarring versus residual or recurrent infection.  Per the patient and his wife he is absolutely showing no signs or symptoms of being sick.  Therefore we will assume that this is an evolving scar related to his pneumonia, and we will follow radiology recommendations for 69-month follow-up. Sherre Lain, and [place] please fax results to PCP and let them know plan is for 43-month follow-up. Patient has asked that you make a copy of the scan and send it to them at home so that they can take it to the cardiologist the patient is seeing at [facility]. I told him to expect a call from [place] closer to the time of the 61-month follow-up to make the schedule.  Patient verbalized understanding and had no additional questions at completion of the call.

## 2022-08-24 ENCOUNTER — Encounter: Payer: Self-pay | Admitting: Emergency Medicine

## 2022-08-24 DIAGNOSIS — J449 Chronic obstructive pulmonary disease, unspecified: Secondary | ICD-10-CM | POA: Diagnosis not present

## 2022-08-24 NOTE — Telephone Encounter (Signed)
Results have been faxed to PCP, Wenda Low, NP. LDCT report from PACS has been mailed to patient with a note if the patient's cardiologist needs the images to get the disc from [place] and to call us if needed. 12 month CT chest order has been placed. Nothing further needed at this time.

## 2022-08-24 NOTE — Telephone Encounter (Signed)
See other phone note

## 2022-08-31 DIAGNOSIS — E1169 Type 2 diabetes mellitus with other specified complication: Secondary | ICD-10-CM | POA: Diagnosis not present

## 2022-08-31 DIAGNOSIS — E785 Hyperlipidemia, unspecified: Secondary | ICD-10-CM | POA: Diagnosis not present

## 2022-08-31 DIAGNOSIS — E559 Vitamin D deficiency, unspecified: Secondary | ICD-10-CM | POA: Diagnosis not present

## 2022-08-31 DIAGNOSIS — I10 Essential (primary) hypertension: Secondary | ICD-10-CM | POA: Diagnosis not present

## 2022-08-31 DIAGNOSIS — E538 Deficiency of other specified B group vitamins: Secondary | ICD-10-CM | POA: Diagnosis not present

## 2022-08-31 DIAGNOSIS — Z125 Encounter for screening for malignant neoplasm of prostate: Secondary | ICD-10-CM | POA: Diagnosis not present

## 2022-08-31 DIAGNOSIS — E782 Mixed hyperlipidemia: Secondary | ICD-10-CM | POA: Diagnosis not present

## 2022-08-31 DIAGNOSIS — M0579 Rheumatoid arthritis with rheumatoid factor of multiple sites without organ or systems involvement: Secondary | ICD-10-CM | POA: Diagnosis not present

## 2022-09-07 DIAGNOSIS — J9601 Acute respiratory failure with hypoxia: Secondary | ICD-10-CM | POA: Diagnosis not present

## 2022-09-07 DIAGNOSIS — J449 Chronic obstructive pulmonary disease, unspecified: Secondary | ICD-10-CM | POA: Diagnosis not present

## 2022-09-07 DIAGNOSIS — J441 Chronic obstructive pulmonary disease with (acute) exacerbation: Secondary | ICD-10-CM | POA: Diagnosis not present

## 2022-09-11 ENCOUNTER — Ambulatory Visit: Payer: Self-pay

## 2022-09-11 NOTE — Patient Outreach (Signed)
  Care Coordination   Follow Up Visit Note   09/11/2022 Name: BARCLAY LOOK MRN: [id number] DOB: [birth year]  Nathan Nicholson is a 67 y.o. year old male who sees Potts, Johnney Killian, NP for primary care. I spoke with  Nathan Nicholson by phone today.  What matters to the patients health and wellness today?  Follow up call with patient today. Patient reports that he is doing well. Reports that he is weighing daily.  Reports weight range of 172-174. Denies any swelling or shortness of breath today. States CBG of 112 today. Recent follow up with PCP with no changes to medications. Denies any new concerns today.     Goals Addressed               This Visit's Progress     I am worried about my heart (pt-stated)        . Interventions Today    Flowsheet Row Most Recent Value  Chronic Disease   Chronic disease during today's visit Diabetes, Congestive Heart Failure (CHF)  General Interventions   General Interventions Discussed/Reviewed General Interventions Reviewed, Labs, Doctor Visits  Doctor Visits Discussed/Reviewed Doctor Visits Discussed, PCP, Specialist  Exercise Interventions   Exercise Discussed/Reviewed Weight Managment  Education Interventions   Education Provided Provided Education  [Reviewed heart failure zones.]  Provided Verbal Education On Nutrition, Labs, Blood Sugar Monitoring, Medication, When to see the doctor  Nutrition Interventions   Nutrition Discussed/Reviewed Decreasing salt  Pharmacy Interventions   Pharmacy Dicussed/Reviewed Medications and their functions              SDOH assessments and interventions completed:  No     Care Coordination Interventions:  Yes, provided   Follow up plan: Follow up call scheduled for 10/23/2022    Encounter Outcome:  Pt. Visit Completed   Rowe Pavy, RN, BSN, CEN [facility] Coordinator [phone]

## 2022-09-27 DIAGNOSIS — J449 Chronic obstructive pulmonary disease, unspecified: Secondary | ICD-10-CM | POA: Diagnosis not present

## 2022-09-27 DIAGNOSIS — J441 Chronic obstructive pulmonary disease with (acute) exacerbation: Secondary | ICD-10-CM | POA: Diagnosis not present

## 2022-10-08 DIAGNOSIS — J441 Chronic obstructive pulmonary disease with (acute) exacerbation: Secondary | ICD-10-CM | POA: Diagnosis not present

## 2022-10-08 DIAGNOSIS — J449 Chronic obstructive pulmonary disease, unspecified: Secondary | ICD-10-CM | POA: Diagnosis not present

## 2022-10-08 DIAGNOSIS — J9601 Acute respiratory failure with hypoxia: Secondary | ICD-10-CM | POA: Diagnosis not present

## 2022-10-10 ENCOUNTER — Telehealth: Payer: Self-pay | Admitting: Acute Care

## 2022-10-10 DIAGNOSIS — F17210 Nicotine dependence, cigarettes, uncomplicated: Secondary | ICD-10-CM

## 2022-10-10 DIAGNOSIS — Z87891 Personal history of nicotine dependence: Secondary | ICD-10-CM

## 2022-10-10 DIAGNOSIS — Z122 Encounter for screening for malignant neoplasm of respiratory organs: Secondary | ICD-10-CM

## 2022-10-10 NOTE — Telephone Encounter (Signed)
LDCT done 08/16/22 at [facility]:  IMPRESSION: 1. Lung-RADS 2, benign appearance or behavior. Continue annual screening with low-dose chest CT without contrast in 12 months. 2. Right lower and less so right upper lobe peribronchovascular ill-defined nodularity, at the site of pneumonia on 02/13/2022. Although this could represent postinfectious scarring, residual or recurrent infection is suspected. 3. Aortic atherosclerosis (ICD10-I70.0), coronary artery atherosclerosis and emphysema (ICD10-J43.9).   Spoke with Annia Belt (DPR) and advised of pt's LDCT results. Small nodules noted that were similar to last scan. Area of scarring possibly from previous pneumonia from 02/2022. She did report that pt did have a pneumonia around that time but she denied pt having any lingering symptoms. I advised that we will repeat CT in 12 months and will call closer to that time to schedule. She verbalized understanding and had no further questions.

## 2022-10-23 ENCOUNTER — Ambulatory Visit: Payer: Self-pay

## 2022-10-23 NOTE — Patient Outreach (Signed)
  Care Coordination   10/23/2022 Name: Nathan Nicholson MRN: [id number] DOB: 1955/11/16   Care Coordination Outreach Attempts:  An unsuccessful telephone outreach was attempted for a scheduled appointment today.  Follow Up Plan:  Additional outreach attempts will be made to offer the patient care coordination information and services.   Encounter Outcome:  No Answer   Care Coordination Interventions:  No, not indicated    Rowe Pavy, RN, BSN, [facility] [phone]

## 2022-10-25 DIAGNOSIS — J441 Chronic obstructive pulmonary disease with (acute) exacerbation: Secondary | ICD-10-CM | POA: Diagnosis not present

## 2022-10-25 DIAGNOSIS — J449 Chronic obstructive pulmonary disease, unspecified: Secondary | ICD-10-CM | POA: Diagnosis not present

## 2022-11-08 DIAGNOSIS — J441 Chronic obstructive pulmonary disease with (acute) exacerbation: Secondary | ICD-10-CM | POA: Diagnosis not present

## 2022-11-08 DIAGNOSIS — J449 Chronic obstructive pulmonary disease, unspecified: Secondary | ICD-10-CM | POA: Diagnosis not present

## 2022-11-08 DIAGNOSIS — J9601 Acute respiratory failure with hypoxia: Secondary | ICD-10-CM | POA: Diagnosis not present

## 2022-11-15 DIAGNOSIS — J449 Chronic obstructive pulmonary disease, unspecified: Secondary | ICD-10-CM | POA: Diagnosis not present

## 2022-11-20 ENCOUNTER — Ambulatory Visit: Payer: Self-pay

## 2022-11-20 NOTE — Patient Outreach (Signed)
Care Coordination   Follow Up Visit Note   11/20/2022 Name: Nathan Nicholson MRN: [id number] DOB: [birth year]  Nathan Nicholson is a 67 y.o. year old male who sees Potts, Johnney Killian, NP for primary care. I spoke with  Nathan Nicholson by phone today.  What matters to the patients health and wellness today?  Weight 176 pounds.  Denies any shortness of breath, no chest pain and no swelling.  Reports CBG ok . Todays reading of  107. Reports taking medications as prescribed.  Patient reports that he feels like his heart is stable and that he is not worried about it anymore. Denies any new problems or concerns today.    Goals Addressed               This Visit's Progress     COMPLETED: I am worried about my heart (pt-stated)        . Interventions Today    Flowsheet Row Most Recent Value  Chronic Disease   Chronic disease during today's visit Diabetes, Congestive Heart Failure (CHF)  General Interventions   General Interventions Discussed/Reviewed General Interventions Discussed, Labs  Exercise Interventions   Exercise Discussed/Reviewed Physical Activity  Education Interventions   Education Provided Provided Education  [reviewed heart failure zones and when to call MD. Encouraged DM diet and healthy exercise and lifestyle.]  Provided Verbal Education On Nutrition, Blood Sugar Monitoring, Exercise, Medication, When to see the doctor, Sick Day Rules  Pharmacy Interventions   Pharmacy Dicussed/Reviewed Medications and their functions            Patient reports that he is self managing well and denies any other needs. Reviewed case closure and reminded patient that he could call me any time in the future if needed. He agreed.   SDOH assessments and interventions completed:  No     Care Coordination Interventions:  Yes, provided   Follow up plan: No further intervention required.   Encounter Outcome:  Patient Visit Completed   Lonia Chimera, RN, BSN, CEN [facility]  Coordinator [phone]

## 2022-12-04 DIAGNOSIS — J449 Chronic obstructive pulmonary disease, unspecified: Secondary | ICD-10-CM | POA: Diagnosis not present

## 2022-12-04 DIAGNOSIS — M0579 Rheumatoid arthritis with rheumatoid factor of multiple sites without organ or systems involvement: Secondary | ICD-10-CM | POA: Diagnosis not present

## 2022-12-04 DIAGNOSIS — Z23 Encounter for immunization: Secondary | ICD-10-CM | POA: Diagnosis not present

## 2022-12-04 DIAGNOSIS — E782 Mixed hyperlipidemia: Secondary | ICD-10-CM | POA: Diagnosis not present

## 2022-12-04 DIAGNOSIS — I10 Essential (primary) hypertension: Secondary | ICD-10-CM | POA: Diagnosis not present

## 2022-12-04 DIAGNOSIS — E559 Vitamin D deficiency, unspecified: Secondary | ICD-10-CM | POA: Diagnosis not present

## 2022-12-04 DIAGNOSIS — E785 Hyperlipidemia, unspecified: Secondary | ICD-10-CM | POA: Diagnosis not present

## 2022-12-04 DIAGNOSIS — E538 Deficiency of other specified B group vitamins: Secondary | ICD-10-CM | POA: Diagnosis not present

## 2022-12-04 DIAGNOSIS — E1169 Type 2 diabetes mellitus with other specified complication: Secondary | ICD-10-CM | POA: Diagnosis not present

## 2022-12-08 DIAGNOSIS — J9601 Acute respiratory failure with hypoxia: Secondary | ICD-10-CM | POA: Diagnosis not present

## 2022-12-08 DIAGNOSIS — J441 Chronic obstructive pulmonary disease with (acute) exacerbation: Secondary | ICD-10-CM | POA: Diagnosis not present

## 2022-12-08 DIAGNOSIS — J449 Chronic obstructive pulmonary disease, unspecified: Secondary | ICD-10-CM | POA: Diagnosis not present

## 2022-12-22 DIAGNOSIS — J449 Chronic obstructive pulmonary disease, unspecified: Secondary | ICD-10-CM | POA: Diagnosis not present

## 2023-01-08 DIAGNOSIS — J441 Chronic obstructive pulmonary disease with (acute) exacerbation: Secondary | ICD-10-CM | POA: Diagnosis not present

## 2023-01-08 DIAGNOSIS — J449 Chronic obstructive pulmonary disease, unspecified: Secondary | ICD-10-CM | POA: Diagnosis not present

## 2023-01-08 DIAGNOSIS — J9601 Acute respiratory failure with hypoxia: Secondary | ICD-10-CM | POA: Diagnosis not present

## 2023-02-07 DIAGNOSIS — J449 Chronic obstructive pulmonary disease, unspecified: Secondary | ICD-10-CM | POA: Diagnosis not present

## 2023-02-07 DIAGNOSIS — J9601 Acute respiratory failure with hypoxia: Secondary | ICD-10-CM | POA: Diagnosis not present

## 2023-02-07 DIAGNOSIS — J441 Chronic obstructive pulmonary disease with (acute) exacerbation: Secondary | ICD-10-CM | POA: Diagnosis not present

## 2023-02-09 DIAGNOSIS — M1991 Primary osteoarthritis, unspecified site: Secondary | ICD-10-CM | POA: Diagnosis not present

## 2023-02-09 DIAGNOSIS — E669 Obesity, unspecified: Secondary | ICD-10-CM | POA: Diagnosis not present

## 2023-02-09 DIAGNOSIS — Z6835 Body mass index (BMI) 35.0-35.9, adult: Secondary | ICD-10-CM | POA: Diagnosis not present

## 2023-02-09 DIAGNOSIS — M25561 Pain in right knee: Secondary | ICD-10-CM | POA: Diagnosis not present

## 2023-02-09 DIAGNOSIS — M0579 Rheumatoid arthritis with rheumatoid factor of multiple sites without organ or systems involvement: Secondary | ICD-10-CM | POA: Diagnosis not present

## 2023-02-09 DIAGNOSIS — M545 Low back pain, unspecified: Secondary | ICD-10-CM | POA: Diagnosis not present

## 2023-02-09 DIAGNOSIS — M8080XD Other osteoporosis with current pathological fracture, unspecified site, subsequent encounter for fracture with routine healing: Secondary | ICD-10-CM | POA: Diagnosis not present

## 2023-03-06 DIAGNOSIS — J40 Bronchitis, not specified as acute or chronic: Secondary | ICD-10-CM | POA: Diagnosis not present

## 2023-03-06 DIAGNOSIS — E1169 Type 2 diabetes mellitus with other specified complication: Secondary | ICD-10-CM | POA: Diagnosis not present

## 2023-03-06 DIAGNOSIS — M0579 Rheumatoid arthritis with rheumatoid factor of multiple sites without organ or systems involvement: Secondary | ICD-10-CM | POA: Diagnosis not present

## 2023-03-06 DIAGNOSIS — I252 Old myocardial infarction: Secondary | ICD-10-CM | POA: Diagnosis not present

## 2023-03-06 DIAGNOSIS — E782 Mixed hyperlipidemia: Secondary | ICD-10-CM | POA: Diagnosis not present

## 2023-03-06 DIAGNOSIS — J449 Chronic obstructive pulmonary disease, unspecified: Secondary | ICD-10-CM | POA: Diagnosis not present

## 2023-03-06 DIAGNOSIS — E785 Hyperlipidemia, unspecified: Secondary | ICD-10-CM | POA: Diagnosis not present

## 2023-03-06 DIAGNOSIS — I10 Essential (primary) hypertension: Secondary | ICD-10-CM | POA: Diagnosis not present

## 2023-03-07 DIAGNOSIS — I2510 Atherosclerotic heart disease of native coronary artery without angina pectoris: Secondary | ICD-10-CM | POA: Diagnosis not present

## 2023-03-07 DIAGNOSIS — I10 Essential (primary) hypertension: Secondary | ICD-10-CM | POA: Diagnosis not present

## 2023-03-07 DIAGNOSIS — F17210 Nicotine dependence, cigarettes, uncomplicated: Secondary | ICD-10-CM | POA: Diagnosis not present

## 2023-03-07 DIAGNOSIS — Z9981 Dependence on supplemental oxygen: Secondary | ICD-10-CM | POA: Diagnosis not present

## 2023-03-07 DIAGNOSIS — Z7982 Long term (current) use of aspirin: Secondary | ICD-10-CM | POA: Diagnosis not present

## 2023-03-07 DIAGNOSIS — E669 Obesity, unspecified: Secondary | ICD-10-CM | POA: Diagnosis not present

## 2023-03-07 DIAGNOSIS — Z955 Presence of coronary angioplasty implant and graft: Secondary | ICD-10-CM | POA: Diagnosis not present

## 2023-03-07 DIAGNOSIS — J440 Chronic obstructive pulmonary disease with acute lower respiratory infection: Secondary | ICD-10-CM | POA: Diagnosis not present

## 2023-03-07 DIAGNOSIS — E785 Hyperlipidemia, unspecified: Secondary | ICD-10-CM | POA: Diagnosis not present

## 2023-03-07 DIAGNOSIS — J180 Bronchopneumonia, unspecified organism: Secondary | ICD-10-CM | POA: Diagnosis not present

## 2023-03-07 DIAGNOSIS — J9611 Chronic respiratory failure with hypoxia: Secondary | ICD-10-CM | POA: Diagnosis not present

## 2023-03-07 DIAGNOSIS — J441 Chronic obstructive pulmonary disease with (acute) exacerbation: Secondary | ICD-10-CM | POA: Diagnosis not present

## 2023-03-07 DIAGNOSIS — Z79899 Other long term (current) drug therapy: Secondary | ICD-10-CM | POA: Diagnosis not present

## 2023-03-08 DIAGNOSIS — J441 Chronic obstructive pulmonary disease with (acute) exacerbation: Secondary | ICD-10-CM | POA: Diagnosis not present

## 2023-03-08 DIAGNOSIS — J180 Bronchopneumonia, unspecified organism: Secondary | ICD-10-CM | POA: Diagnosis not present

## 2023-03-08 DIAGNOSIS — J440 Chronic obstructive pulmonary disease with acute lower respiratory infection: Secondary | ICD-10-CM | POA: Diagnosis not present

## 2023-03-14 DIAGNOSIS — J180 Bronchopneumonia, unspecified organism: Secondary | ICD-10-CM | POA: Diagnosis not present

## 2023-03-14 DIAGNOSIS — Z09 Encounter for follow-up examination after completed treatment for conditions other than malignant neoplasm: Secondary | ICD-10-CM | POA: Diagnosis not present

## 2023-03-14 DIAGNOSIS — Z6835 Body mass index (BMI) 35.0-35.9, adult: Secondary | ICD-10-CM | POA: Diagnosis not present

## 2023-03-14 DIAGNOSIS — Z79899 Other long term (current) drug therapy: Secondary | ICD-10-CM | POA: Diagnosis not present

## 2023-03-24 DIAGNOSIS — E669 Obesity, unspecified: Secondary | ICD-10-CM | POA: Diagnosis not present

## 2023-03-24 DIAGNOSIS — Z79899 Other long term (current) drug therapy: Secondary | ICD-10-CM | POA: Diagnosis not present

## 2023-03-24 DIAGNOSIS — J9611 Chronic respiratory failure with hypoxia: Secondary | ICD-10-CM | POA: Diagnosis not present

## 2023-03-24 DIAGNOSIS — R079 Chest pain, unspecified: Secondary | ICD-10-CM | POA: Diagnosis not present

## 2023-03-24 DIAGNOSIS — I252 Old myocardial infarction: Secondary | ICD-10-CM | POA: Diagnosis not present

## 2023-03-24 DIAGNOSIS — F17210 Nicotine dependence, cigarettes, uncomplicated: Secondary | ICD-10-CM | POA: Diagnosis not present

## 2023-03-24 DIAGNOSIS — Z20822 Contact with and (suspected) exposure to covid-19: Secondary | ICD-10-CM | POA: Diagnosis not present

## 2023-03-24 DIAGNOSIS — Z7982 Long term (current) use of aspirin: Secondary | ICD-10-CM | POA: Diagnosis not present

## 2023-03-24 DIAGNOSIS — I2510 Atherosclerotic heart disease of native coronary artery without angina pectoris: Secondary | ICD-10-CM | POA: Diagnosis not present

## 2023-03-24 DIAGNOSIS — J449 Chronic obstructive pulmonary disease, unspecified: Secondary | ICD-10-CM | POA: Diagnosis not present

## 2023-03-24 DIAGNOSIS — I209 Angina pectoris, unspecified: Secondary | ICD-10-CM | POA: Diagnosis not present

## 2023-03-24 DIAGNOSIS — Z955 Presence of coronary angioplasty implant and graft: Secondary | ICD-10-CM | POA: Diagnosis not present

## 2023-03-24 DIAGNOSIS — J9612 Chronic respiratory failure with hypercapnia: Secondary | ICD-10-CM | POA: Diagnosis not present

## 2023-03-24 DIAGNOSIS — I10 Essential (primary) hypertension: Secondary | ICD-10-CM | POA: Diagnosis not present

## 2023-03-25 DIAGNOSIS — I2510 Atherosclerotic heart disease of native coronary artery without angina pectoris: Secondary | ICD-10-CM | POA: Diagnosis not present

## 2023-03-25 DIAGNOSIS — J9611 Chronic respiratory failure with hypoxia: Secondary | ICD-10-CM | POA: Diagnosis not present

## 2023-03-25 DIAGNOSIS — R079 Chest pain, unspecified: Secondary | ICD-10-CM | POA: Diagnosis not present

## 2023-03-25 DIAGNOSIS — J9612 Chronic respiratory failure with hypercapnia: Secondary | ICD-10-CM | POA: Diagnosis not present

## 2023-03-26 DIAGNOSIS — J9612 Chronic respiratory failure with hypercapnia: Secondary | ICD-10-CM | POA: Diagnosis not present

## 2023-03-26 DIAGNOSIS — I2510 Atherosclerotic heart disease of native coronary artery without angina pectoris: Secondary | ICD-10-CM | POA: Diagnosis not present

## 2023-03-26 DIAGNOSIS — R079 Chest pain, unspecified: Secondary | ICD-10-CM | POA: Diagnosis not present

## 2023-03-26 DIAGNOSIS — J9611 Chronic respiratory failure with hypoxia: Secondary | ICD-10-CM | POA: Diagnosis not present

## 2023-03-27 DIAGNOSIS — J9612 Chronic respiratory failure with hypercapnia: Secondary | ICD-10-CM | POA: Diagnosis not present

## 2023-03-27 DIAGNOSIS — I2510 Atherosclerotic heart disease of native coronary artery without angina pectoris: Secondary | ICD-10-CM | POA: Diagnosis not present

## 2023-03-27 DIAGNOSIS — R079 Chest pain, unspecified: Secondary | ICD-10-CM | POA: Diagnosis not present

## 2023-03-27 DIAGNOSIS — J9611 Chronic respiratory failure with hypoxia: Secondary | ICD-10-CM | POA: Diagnosis not present

## 2023-03-29 DIAGNOSIS — J449 Chronic obstructive pulmonary disease, unspecified: Secondary | ICD-10-CM | POA: Diagnosis not present

## 2023-04-03 DIAGNOSIS — R0789 Other chest pain: Secondary | ICD-10-CM | POA: Diagnosis not present

## 2023-04-03 DIAGNOSIS — J449 Chronic obstructive pulmonary disease, unspecified: Secondary | ICD-10-CM | POA: Diagnosis not present

## 2023-04-09 DIAGNOSIS — Z0000 Encounter for general adult medical examination without abnormal findings: Secondary | ICD-10-CM | POA: Diagnosis not present

## 2023-04-09 DIAGNOSIS — Z139 Encounter for screening, unspecified: Secondary | ICD-10-CM | POA: Diagnosis not present

## 2023-04-09 DIAGNOSIS — Z9181 History of falling: Secondary | ICD-10-CM | POA: Diagnosis not present

## 2023-04-10 ENCOUNTER — Encounter: Payer: Self-pay | Admitting: Family Medicine

## 2023-04-13 ENCOUNTER — Other Ambulatory Visit: Payer: Self-pay | Admitting: Primary Care

## 2023-04-13 DIAGNOSIS — J441 Chronic obstructive pulmonary disease with (acute) exacerbation: Secondary | ICD-10-CM

## 2023-04-20 ENCOUNTER — Encounter: Payer: Self-pay | Admitting: Primary Care

## 2023-04-20 ENCOUNTER — Ambulatory Visit: Payer: PPO | Admitting: Primary Care

## 2023-04-20 VITALS — BP 147/92 | HR 79 | Temp 97.7°F | Ht 63.0 in | Wt 202.2 lb

## 2023-04-20 DIAGNOSIS — J441 Chronic obstructive pulmonary disease with (acute) exacerbation: Secondary | ICD-10-CM

## 2023-04-20 MED ORDER — PREDNISONE 10 MG PO TABS: ORAL_TABLET | ORAL | 0 refills | Status: DC

## 2023-04-20 MED ORDER — IPRATROPIUM-ALBUTEROL 0.5-2.5 (3) MG/3ML IN SOLN: 3.0000 mL | Freq: Four times a day (QID) | RESPIRATORY_TRACT | Status: DC | PRN

## 2023-04-20 MED ORDER — BREZTRI AEROSPHERE 160-9-4.8 MCG/ACT IN AERO: 2.0000 | INHALATION_SPRAY | Freq: Two times a day (BID) | RESPIRATORY_TRACT | Status: DC

## 2023-04-20 MED ORDER — GUAIFENESIN ER 600 MG PO TB12: 600.0000 mg | ORAL_TABLET | Freq: Two times a day (BID) | ORAL | 0 refills | Status: DC | PRN

## 2023-04-20 MED ORDER — AZITHROMYCIN 250 MG PO TABS: ORAL_TABLET | ORAL | 0 refills | Status: DC

## 2023-04-20 NOTE — Progress Notes (Signed)
@Patient  ID: Nathan Nicholson, male    DOB: 09/29/1955, 68 y.o.   MRN: [id number]  Chief Complaint  Patient presents with   Follow-up    Referring provider: [name], NP  HPI: 68 year old male, former smoker.  Past medical history significant for COPD, hypertension, RA, type 2 diabetes, hyperlipidemia, obesity.  Patient of Dr. Francine Graven, last seen in office on 06/07/2021 for COPD exacerbation.  Previous [facility] pulmonary encounter: 07/28/2021 Patient presents today for a follow-up visit to discuss oxygen.  She was seen by Dr. Francine Graven in April for COPD exacerbation treated with Z-Pak and prednisone taper she was also started on budesonide nebulizer treatment twice daily and advised to continue DuoNebs every 6 hours.  He is chronically on 2l oxygen at home. He came in today to qualify for POC. He was in the hospital for planned kypoplasy. Ended up with pneumonia. He was admitted [facility] 5/13-19 for pneumonia. Discharged on Doxycyline. He is feeling better. Still coughing but not getting up as much as he previously was. Sputum is clear. He is taking budesonide twice and duoneb three times a day.    04/20/2023 Discussed the use of AI scribe software for clinical note transcription with the patient, who gave verbal consent to proceed.  History of Present Illness   OUMAR Nicholson is a 68 year old male with COPD and respiratory failure who presents for a follow-up visit.  He has a history of COPD and respiratory failure, with previous pneumonia in 2019 following back surgery. A chest x-ray a year ago showed scarring at left lung base, but no pneumonia was present in a chest x-ray from May 2023.  He experiences shortness of breath, particularly when walking, and uses two liters of oxygen continuously. His breathing worsens at night, causing significant effort to breathe. He has a chronic cough with yellowish mucus. No chest tightness or wheezing.  His current medication regimen includes a  nebulizer with budesonide (Pulmicort) twice daily and ipratropium/albuterol (Duoneb) every eight hours. He also carries an inhaler, possibly Ventolin, for use as needed. He notes inconsistent adherence to this regimen.  He has not had a sleep study for sleep apnea and reports poor sleep, getting only a couple of hours at a time. He has not participated in pulmonary rehabilitation but is open to it, noting difficulty walking long distances due to shortness of breath.      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 Daisy Floro by hematologist Dr. Truett Perna   Myocardial infarction [facility])    Oxygen dependent    2L via [place]   Pneumonia    Rheumatoid arthritis (HCC)     Tobacco History: Social History   Tobacco Use  Smoking Status Every Day   Current packs/day: 0.00   Average packs/day: 0.5 packs/day for 40.0 years (20.0 ttl pk-yrs)   Types: Cigarettes   Start date: 06/28/1981   Last attempt to quit: 06/28/2021   Years since quitting: 1.8  Smokeless Tobacco Never   Ready to quit: Not Answered Counseling given: Not Answered   Outpatient Medications Prior to Visit  Medication Sig Dispense Refill  acetaminophen (TYLENOL) 500 MG tablet Take 1,000 mg by mouth every 6 (six) hours as needed for moderate pain.     albuterol (VENTOLIN HFA) 108 (90 Base) MCG/ACT inhaler Inhale 2 puffs into the lungs every 4 (four) hours as needed (Breathing).     alendronate (FOSAMAX) 70 MG tablet Take 70 mg by mouth every Friday. Take with a full glass of water on an empty stomach.     aspirin 81 MG EC tablet Take 81 mg by mouth every morning.      atorvastatin (LIPITOR) 10 MG tablet Take 10 mg by mouth every morning.     budesonide (PULMICORT) 0.5 MG/2ML nebulizer solution USE 1 VIAL  IN  NEBULIZER TWICE  DAILY - Rinse Mouth Out After Each Treatment 60 mL 0   Calcium Carb-Cholecalciferol (CALCIUM 600+D3 PO) Take 1 tablet by mouth 2 (two) times daily.     clopidogrel (PLAVIX) 75 MG tablet Take 75 mg by mouth daily.     cyanocobalamin (,VITAMIN B-12,) 1000 MCG/ML injection Inject 1,000 mcg into the muscle every 30 (thirty) days.     empagliflozin (JARDIANCE) 10 MG TABS tablet Take 10 mg by mouth daily.     escitalopram (LEXAPRO) 10 MG tablet Take 10 mg by mouth at bedtime.     ferrous sulfate 325 (65 FE) MG tablet Take 325 mg by mouth daily.     folic acid (FOLVITE) 1 MG tablet Take 1 mg by mouth every morning.     ipratropium-albuterol (DUONEB) 0.5-2.5 (3) MG/3ML SOLN USE 1 VIAL IN NEBULIZER 3 TIMES DAILY 90 mL 11   lisinopril-hydrochlorothiazide (ZESTORETIC) 20-12.5 MG tablet Take 1 tablet by mouth daily. PT TO TAKE ONLY 1/2 TABLET AT HS (PER HIS REPORT)     meloxicam (MOBIC) 15 MG tablet Take 15 mg by mouth daily.     methotrexate (RHEUMATREX) 2.5 MG tablet Take 10 mg by mouth every Tuesday. Caution:Chemotherapy. Protect from light.     nebivolol (BYSTOLIC) 10 MG tablet Take 10 mg by mouth daily.     nitroGLYCERIN (NITROSTAT) 0.4 MG SL tablet Place 0.4 mg under the tongue every 5 (five) minutes as needed for chest pain. Up to 3 doses.     OXYGEN Inhale 2 L into the lungs continuous.     senna-docusate (SENOKOT-S) 8.6-50 MG tablet Take 1 tablet by mouth 2 (two) times daily as needed for moderate constipation. 20 tablet 0   Skin Protectants, Misc. (EUCERIN) cream Apply 1 application. topically daily.     Vitamin D, Ergocalciferol, (DRISDOL) 1.25 MG (50000 UNIT) CAPS capsule Take 50,000 Units by mouth every Monday.     docusate sodium (COLACE) 100 MG capsule Take 100 mg by mouth daily. (Patient not taking: Reported on 04/20/2023)      pantoprazole (PROTONIX) 40 MG tablet Take 20 mg by mouth daily. (Patient not taking: Reported on 04/20/2023)     No facility-administered medications prior to visit.   Review of Systems  Review of Systems  Constitutional: Negative.   HENT:  Positive for congestion.   Respiratory:  Positive for cough and shortness of breath.   Cardiovascular: Negative.      Physical Exam  BP (!) 147/92 (BP Location: Right Arm, Patient Position: Sitting, Cuff Size: Large)   Pulse 79   Temp 97.7 F (36.5 C) (Temporal)   Ht 5\' 3"  (1.6 m)   Wt 202 lb 3.2 oz (91.7 kg)   SpO2 96%   BMI 35.82 kg/m  Physical Exam Constitutional:  Appearance: Normal appearance.  HENT:     Head: Normocephalic and atraumatic.     Nose: Congestion present.  Cardiovascular:     Rate and Rhythm: Normal rate and regular rhythm.  Pulmonary:     Effort: Pulmonary effort is normal.     Breath sounds: Wheezing and rhonchi present.     Comments: POC 2L Musculoskeletal:        General: Normal range of motion.  Skin:    General: Skin is warm and dry.  Neurological:     General: No focal deficit present.     Mental Status: He is alert and oriented to person, place, and time. Mental status is at baseline.  Psychiatric:        Mood and Affect: Mood normal.        Behavior: Behavior normal.        Thought Content: Thought content normal.        Judgment: Judgment normal.      Lab Results:  CBC    Component Value Date/Time   WBC 18.0 (H) 10/05/2021 0653   RBC 4.91 10/05/2021 0653   HGB 14.4 10/05/2021 0653   HGB 15.3 05/04/2021 1100   HGB 16.3 03/11/2013 0847   HCT 44.5 10/05/2021 0653   HCT 48.3 03/11/2013 0847   PLT 339 10/05/2021 0653   PLT 428 (H) 05/04/2021 1100   PLT 372 Large & giant platelets 03/11/2013 0847   MCV 90.6 10/05/2021 0653   MCV 90.2 03/11/2013 0847   MCH 29.3 10/05/2021 0653   MCHC 32.4 10/05/2021 0653   RDW 18.6 (H) 10/05/2021 0653   RDW 14.7 (H) 03/11/2013 0847   LYMPHSABS 4.9  (H) 10/05/2021 0653   LYMPHSABS 3.6 (H) 03/11/2013 0847   MONOABS 1.1 (H) 10/05/2021 0653   MONOABS 2.1 (H) 03/11/2013 0847   EOSABS 0.9 (H) 10/05/2021 0653   EOSABS 0.9 (H) 03/11/2013 0847   BASOSABS 0.2 (H) 10/05/2021 0653   BASOSABS 0.2 (H) 03/11/2013 0847    BMET    Component Value Date/Time   NA 138 10/05/2021 0653   K 3.4 (L) 10/05/2021 0653   CL 101 10/05/2021 0653   CO2 29 10/05/2021 0653   GLUCOSE 98 10/05/2021 0653   BUN 7 (L) 10/05/2021 0653   CREATININE 0.84 10/05/2021 0653   CREATININE 0.94 05/04/2021 1100   CALCIUM 9.0 10/05/2021 0653   GFRNONAA >60 10/05/2021 0653   GFRNONAA >60 05/04/2021 1100    BNP No results found for: "BNP"  ProBNP No results found for: "PROBNP"  Imaging: No results found.   Assessment & Plan:   1. COPD with exacerbation (HCC) (Primary) - AMB REFERRAL FOR DME    Chronic Obstructive Pulmonary Disease (COPD) with acute bronchitis  Patient reports shortness of breath with exertion, particularly at night, and is currently using nebulizers inconsistently. He has a cough with yellow mucus and difficulty expectorating mucus. Auscultation revealed congestion and wheezing.  -Transition from nebulizers to [facility] inhaler, to be used morning and evening. -Continue use of nebulizers as a backup for acute symptom exacerbation. -Start prednisone due to presence of wheezing -Start Mucinex to thin mucus and facilitate expectoration. -Start azithromycin (Z-Pak) to treat bronchitis and prevent progression to pneumonia. -Refer to pulmonary rehab for further management of symptoms.  Follow-up Schedule appointment with Dr. Francine Graven in 2-3 months, or sooner if symptoms worsen.      Glenford Bayley, NP 04/20/2023

## 2023-04-20 NOTE — Patient Instructions (Signed)
Start z-pack and prednisone taper Start Breztri Aerosphere - take 2 puffs morning and evening Take regular mucinex twice daily for 7 days then as needed to loosen congestion Only use nebulizer for rescue  Referring you to pulmonary rehab at [facility], if not available we can sign you up for home rehab  Follow-up: 2-3 months with Dr. Francine Graven

## 2023-04-23 DIAGNOSIS — E785 Hyperlipidemia, unspecified: Secondary | ICD-10-CM | POA: Diagnosis not present

## 2023-04-23 DIAGNOSIS — I5021 Acute systolic (congestive) heart failure: Secondary | ICD-10-CM | POA: Diagnosis not present

## 2023-04-23 DIAGNOSIS — I2510 Atherosclerotic heart disease of native coronary artery without angina pectoris: Secondary | ICD-10-CM | POA: Diagnosis not present

## 2023-04-23 DIAGNOSIS — I10 Essential (primary) hypertension: Secondary | ICD-10-CM | POA: Diagnosis not present

## 2023-04-23 DIAGNOSIS — Z720 Tobacco use: Secondary | ICD-10-CM | POA: Diagnosis not present

## 2023-05-11 DIAGNOSIS — M25561 Pain in right knee: Secondary | ICD-10-CM | POA: Diagnosis not present

## 2023-05-11 DIAGNOSIS — E669 Obesity, unspecified: Secondary | ICD-10-CM | POA: Diagnosis not present

## 2023-05-11 DIAGNOSIS — M1991 Primary osteoarthritis, unspecified site: Secondary | ICD-10-CM | POA: Diagnosis not present

## 2023-05-11 DIAGNOSIS — M545 Low back pain, unspecified: Secondary | ICD-10-CM | POA: Diagnosis not present

## 2023-05-11 DIAGNOSIS — M0579 Rheumatoid arthritis with rheumatoid factor of multiple sites without organ or systems involvement: Secondary | ICD-10-CM | POA: Diagnosis not present

## 2023-05-11 DIAGNOSIS — Z6836 Body mass index (BMI) 36.0-36.9, adult: Secondary | ICD-10-CM | POA: Diagnosis not present

## 2023-05-11 DIAGNOSIS — M8080XD Other osteoporosis with current pathological fracture, unspecified site, subsequent encounter for fracture with routine healing: Secondary | ICD-10-CM | POA: Diagnosis not present

## 2023-06-01 DIAGNOSIS — Z7689 Persons encountering health services in other specified circumstances: Secondary | ICD-10-CM | POA: Diagnosis not present

## 2023-06-01 DIAGNOSIS — I2510 Atherosclerotic heart disease of native coronary artery without angina pectoris: Secondary | ICD-10-CM | POA: Insufficient documentation

## 2023-06-01 DIAGNOSIS — E782 Mixed hyperlipidemia: Secondary | ICD-10-CM | POA: Diagnosis not present

## 2023-06-01 DIAGNOSIS — I252 Old myocardial infarction: Secondary | ICD-10-CM | POA: Diagnosis not present

## 2023-06-01 DIAGNOSIS — Z955 Presence of coronary angioplasty implant and graft: Secondary | ICD-10-CM | POA: Diagnosis not present

## 2023-06-01 DIAGNOSIS — Z720 Tobacco use: Secondary | ICD-10-CM | POA: Diagnosis not present

## 2023-06-01 DIAGNOSIS — J449 Chronic obstructive pulmonary disease, unspecified: Secondary | ICD-10-CM | POA: Diagnosis not present

## 2023-06-01 DIAGNOSIS — Z136 Encounter for screening for cardiovascular disorders: Secondary | ICD-10-CM | POA: Diagnosis not present

## 2023-06-06 DIAGNOSIS — M0579 Rheumatoid arthritis with rheumatoid factor of multiple sites without organ or systems involvement: Secondary | ICD-10-CM | POA: Diagnosis not present

## 2023-06-06 DIAGNOSIS — E782 Mixed hyperlipidemia: Secondary | ICD-10-CM | POA: Diagnosis not present

## 2023-06-06 DIAGNOSIS — J449 Chronic obstructive pulmonary disease, unspecified: Secondary | ICD-10-CM | POA: Diagnosis not present

## 2023-06-06 DIAGNOSIS — E538 Deficiency of other specified B group vitamins: Secondary | ICD-10-CM | POA: Diagnosis not present

## 2023-06-06 DIAGNOSIS — E785 Hyperlipidemia, unspecified: Secondary | ICD-10-CM | POA: Diagnosis not present

## 2023-06-06 DIAGNOSIS — E1169 Type 2 diabetes mellitus with other specified complication: Secondary | ICD-10-CM | POA: Diagnosis not present

## 2023-06-06 DIAGNOSIS — I10 Essential (primary) hypertension: Secondary | ICD-10-CM | POA: Diagnosis not present

## 2023-06-06 DIAGNOSIS — Z125 Encounter for screening for malignant neoplasm of prostate: Secondary | ICD-10-CM | POA: Diagnosis not present

## 2023-06-06 DIAGNOSIS — Z6835 Body mass index (BMI) 35.0-35.9, adult: Secondary | ICD-10-CM | POA: Diagnosis not present

## 2023-06-06 DIAGNOSIS — E559 Vitamin D deficiency, unspecified: Secondary | ICD-10-CM | POA: Diagnosis not present

## 2023-06-18 DIAGNOSIS — J449 Chronic obstructive pulmonary disease, unspecified: Secondary | ICD-10-CM | POA: Diagnosis not present

## 2023-06-21 DIAGNOSIS — J449 Chronic obstructive pulmonary disease, unspecified: Secondary | ICD-10-CM | POA: Diagnosis not present

## 2023-07-06 DIAGNOSIS — S22070A Wedge compression fracture of T9-T10 vertebra, initial encounter for closed fracture: Secondary | ICD-10-CM | POA: Diagnosis not present

## 2023-07-06 DIAGNOSIS — M5451 Vertebrogenic low back pain: Secondary | ICD-10-CM | POA: Diagnosis not present

## 2023-07-09 DIAGNOSIS — R600 Localized edema: Secondary | ICD-10-CM | POA: Diagnosis not present

## 2023-07-09 DIAGNOSIS — F419 Anxiety disorder, unspecified: Secondary | ICD-10-CM | POA: Diagnosis not present

## 2023-07-10 ENCOUNTER — Telehealth: Payer: Self-pay | Admitting: Primary Care

## 2023-07-10 NOTE — Telephone Encounter (Signed)
 Cmn received from [facility] for Oxygen  therapy

## 2023-07-14 DIAGNOSIS — I110 Hypertensive heart disease with heart failure: Secondary | ICD-10-CM | POA: Diagnosis not present

## 2023-07-14 DIAGNOSIS — J9601 Acute respiratory failure with hypoxia: Secondary | ICD-10-CM | POA: Diagnosis not present

## 2023-07-14 DIAGNOSIS — E7800 Pure hypercholesterolemia, unspecified: Secondary | ICD-10-CM | POA: Diagnosis not present

## 2023-07-14 DIAGNOSIS — G8929 Other chronic pain: Secondary | ICD-10-CM | POA: Diagnosis not present

## 2023-07-14 DIAGNOSIS — Z9981 Dependence on supplemental oxygen: Secondary | ICD-10-CM | POA: Diagnosis not present

## 2023-07-14 DIAGNOSIS — I5032 Chronic diastolic (congestive) heart failure: Secondary | ICD-10-CM | POA: Diagnosis not present

## 2023-07-14 DIAGNOSIS — R809 Proteinuria, unspecified: Secondary | ICD-10-CM | POA: Diagnosis not present

## 2023-07-14 DIAGNOSIS — F17210 Nicotine dependence, cigarettes, uncomplicated: Secondary | ICD-10-CM | POA: Diagnosis not present

## 2023-07-14 DIAGNOSIS — Z8701 Personal history of pneumonia (recurrent): Secondary | ICD-10-CM | POA: Diagnosis not present

## 2023-07-14 DIAGNOSIS — J449 Chronic obstructive pulmonary disease, unspecified: Secondary | ICD-10-CM | POA: Diagnosis not present

## 2023-07-14 DIAGNOSIS — I10 Essential (primary) hypertension: Secondary | ICD-10-CM | POA: Diagnosis not present

## 2023-07-14 DIAGNOSIS — M069 Rheumatoid arthritis, unspecified: Secondary | ICD-10-CM | POA: Diagnosis not present

## 2023-07-14 DIAGNOSIS — E039 Hypothyroidism, unspecified: Secondary | ICD-10-CM | POA: Diagnosis not present

## 2023-07-14 DIAGNOSIS — J441 Chronic obstructive pulmonary disease with (acute) exacerbation: Secondary | ICD-10-CM | POA: Diagnosis not present

## 2023-07-14 DIAGNOSIS — M549 Dorsalgia, unspecified: Secondary | ICD-10-CM | POA: Diagnosis not present

## 2023-07-14 DIAGNOSIS — I252 Old myocardial infarction: Secondary | ICD-10-CM | POA: Diagnosis not present

## 2023-07-14 DIAGNOSIS — K219 Gastro-esophageal reflux disease without esophagitis: Secondary | ICD-10-CM | POA: Diagnosis not present

## 2023-07-14 DIAGNOSIS — I2510 Atherosclerotic heart disease of native coronary artery without angina pectoris: Secondary | ICD-10-CM | POA: Diagnosis not present

## 2023-07-14 DIAGNOSIS — E1129 Type 2 diabetes mellitus with other diabetic kidney complication: Secondary | ICD-10-CM | POA: Diagnosis not present

## 2023-07-14 DIAGNOSIS — Z87440 Personal history of urinary (tract) infections: Secondary | ICD-10-CM | POA: Diagnosis not present

## 2023-07-14 DIAGNOSIS — Z792 Long term (current) use of antibiotics: Secondary | ICD-10-CM | POA: Diagnosis not present

## 2023-07-14 DIAGNOSIS — Z8673 Personal history of transient ischemic attack (TIA), and cerebral infarction without residual deficits: Secondary | ICD-10-CM | POA: Diagnosis not present

## 2023-07-14 DIAGNOSIS — Z7952 Long term (current) use of systemic steroids: Secondary | ICD-10-CM | POA: Diagnosis not present

## 2023-07-14 DIAGNOSIS — Z7982 Long term (current) use of aspirin: Secondary | ICD-10-CM | POA: Diagnosis not present

## 2023-07-14 DIAGNOSIS — Z79899 Other long term (current) drug therapy: Secondary | ICD-10-CM | POA: Diagnosis not present

## 2023-07-18 ENCOUNTER — Other Ambulatory Visit (HOSPITAL_COMMUNITY): Payer: Self-pay | Admitting: Orthopedic Surgery

## 2023-07-18 DIAGNOSIS — S22080D Wedge compression fracture of T11-T12 vertebra, subsequent encounter for fracture with routine healing: Secondary | ICD-10-CM

## 2023-07-19 ENCOUNTER — Ambulatory Visit (HOSPITAL_COMMUNITY)
Admission: RE | Admit: 2023-07-19 | Discharge: 2023-07-19 | Disposition: A | Discharge status: Home / Self Care | Source: Ambulatory Visit | Attending: Orthopedic Surgery | Admitting: Orthopedic Surgery

## 2023-07-19 DIAGNOSIS — M4854XA Collapsed vertebra, not elsewhere classified, thoracic region, initial encounter for fracture: Secondary | ICD-10-CM | POA: Diagnosis not present

## 2023-07-19 DIAGNOSIS — S22080D Wedge compression fracture of T11-T12 vertebra, subsequent encounter for fracture with routine healing: Secondary | ICD-10-CM | POA: Diagnosis not present

## 2023-07-19 DIAGNOSIS — M40205 Unspecified kyphosis, thoracolumbar region: Secondary | ICD-10-CM | POA: Diagnosis not present

## 2023-07-19 DIAGNOSIS — M47814 Spondylosis without myelopathy or radiculopathy, thoracic region: Secondary | ICD-10-CM | POA: Diagnosis not present

## 2023-07-19 DIAGNOSIS — M4804 Spinal stenosis, thoracic region: Secondary | ICD-10-CM | POA: Diagnosis not present

## 2023-07-20 ENCOUNTER — Ambulatory Visit: Payer: Self-pay | Admitting: Primary Care

## 2023-07-20 ENCOUNTER — Ambulatory Visit: Payer: PPO | Admitting: Primary Care

## 2023-07-20 ENCOUNTER — Encounter: Payer: Self-pay | Admitting: Primary Care

## 2023-07-20 ENCOUNTER — Telehealth: Payer: Self-pay

## 2023-07-20 ENCOUNTER — Ambulatory Visit (INDEPENDENT_AMBULATORY_CARE_PROVIDER_SITE_OTHER)

## 2023-07-20 VITALS — BP 161/90 | HR 84 | Temp 98.7°F | Ht 63.0 in | Wt 205.0 lb

## 2023-07-20 DIAGNOSIS — J42 Unspecified chronic bronchitis: Secondary | ICD-10-CM

## 2023-07-20 DIAGNOSIS — M40204 Unspecified kyphosis, thoracic region: Secondary | ICD-10-CM | POA: Diagnosis not present

## 2023-07-20 DIAGNOSIS — J984 Other disorders of lung: Secondary | ICD-10-CM | POA: Diagnosis not present

## 2023-07-20 DIAGNOSIS — F17210 Nicotine dependence, cigarettes, uncomplicated: Secondary | ICD-10-CM

## 2023-07-20 DIAGNOSIS — Z01818 Encounter for other preprocedural examination: Secondary | ICD-10-CM | POA: Diagnosis not present

## 2023-07-20 NOTE — Progress Notes (Signed)
 @Patient  ID: Nathan Nicholson, male    DOB: 1955/09/02, 68 y.o.   MRN: [id number]  No chief complaint on file.   Referring provider: Barney Boozer, NP  HPI: 68 year old male, former smoker.  Past medical history significant for COPD, hypertension, RA, type 2 diabetes, hyperlipidemia, obesity.  Patient of Dr. Diania Fortes.  Previous [facility] pulmonary encounter: 07/28/2021 Patient presents today for a follow-up visit to discuss oxygen .  She was seen by Dr. Diania Fortes in April for COPD exacerbation treated with Z-Pak and prednisone  taper she was also started on budesonide  nebulizer treatment twice daily and advised to continue DuoNebs every 6 hours.  He is chronically on 2l oxygen  at home. He came in today to qualify for POC. He was in the hospital for planned kypoplasy. Ended up with pneumonia. He was admitted [place] 5/13-19 for pneumonia. Discharged on Doxycyline. He is feeling better. Still coughing but not getting up as much as he previously was. Sputum is clear. He is taking budesonide  twice and duoneb three times a day.    04/20/2023 Discussed the use of AI scribe software for clinical note transcription with the patient, who gave verbal consent to proceed.  History of Present Illness   Nathan Nicholson is a 68 year old male with COPD and respiratory failure who presents for a follow-up visit.  He has a history of COPD and respiratory failure, with previous pneumonia in 2019 following back surgery. A chest x-ray a year ago showed scarring at left lung base, but no pneumonia was present in a chest x-ray from May 2023.  He experiences shortness of breath, particularly when walking, and uses two liters of oxygen  continuously. His breathing worsens at night, causing significant effort to breathe. He has a chronic cough with yellowish mucus. No chest tightness or wheezing.  His current medication regimen includes a nebulizer with budesonide  (Pulmicort ) twice daily and ipratropium/albuterol  (Duoneb)  every eight hours. He also carries an inhaler, possibly Ventolin , for use as needed. He notes inconsistent adherence to this regimen.  He has not had a sleep study for sleep apnea and reports poor sleep, getting only a couple of hours at a time. He has not participated in pulmonary rehabilitation but is open to it, noting difficulty walking long distances due to shortness of breath.   1. COPD with exacerbation (HCC) (Primary) - AMB REFERRAL FOR DME    Chronic Obstructive Pulmonary Disease (COPD) with acute bronchitis  Patient reports shortness of breath with exertion, particularly at night, and is currently using nebulizers inconsistently. He has a cough with yellow mucus and difficulty expectorating mucus. Auscultation revealed congestion and wheezing.  -Transition from nebulizers to Breztri  inhaler, to be used morning and evening. -Continue use of nebulizers as a backup for acute symptom exacerbation. -Start prednisone  due to presence of wheezing -Start Mucinex  to thin mucus and facilitate expectoration. -Start azithromycin  (Z-Pak) to treat bronchitis and prevent progression to pneumonia. -Refer to pulmonary rehab for further management of symptoms.   Follow-up Schedule appointment with Dr. Diania Fortes in 2-3 months, or sooner if symptoms worsen.    07/20/2023 - Interim hx  Discussed the use of AI scribe software for clinical note transcription with the patient, who gave verbal consent to proceed.  History of Present Illness   [name] is a 68 year old male with COPD and CHF who presents for follow-up of respiratory symptoms.  He was last seen in February during a COPD flare-up and was prescribed a Z-Pak. His medication regimen  was adjusted from nebulizers to Breztri , and he was instructed to stop Pulmicort . He uses Breztri  twice daily, two puffs in the morning and two at night, and uses ipratropium albuterol  (DuoNeb) for breakthrough symptoms, occasionally using Ventolin  or albuterol   when out.  He reports improvement in nighttime breathing since starting Breztri , taking it at 10 PM and again at 6 AM. He uses oxygen  at two liters and has a chronic cough with phlegm production. He finds it difficult to cough hard due to back pain. He is scheduled for back surgery and has had three previous back surgeries without anesthesia issues.  He is on Plavix (clopidogrel) as a blood thinner. He has not had a sleep study and does not feel he needs one. He uses a flutter valve to help with congestion and has been using Mucinex , which he describes as a blue and white pill, twice a day to thin mucus. He smokes two to three cigarettes a day.  He had an MRI of the spine recently, with results pending, and is awaiting back surgery. He has not had a recent chest x-ray or CT scan for lung cancer screening, despite being a current smoker. He wants to proceed with back surgery due to discomfort from sleeping in a chair.     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 Zonia Hire by hematologist Dr. Scherrie Curt   Myocardial infarction [facility])    Oxygen  dependent    2L via [place]   Pneumonia    Rheumatoid arthritis (HCC)     Tobacco History: Social History   Tobacco Use  Smoking Status Every Day   Current packs/day: 0.00   Average packs/day: 0.5 packs/day for 40.0 years (20.0 ttl pk-yrs)   Types: Cigarettes   Start date: 06/28/1981   Last attempt to quit: 06/28/2021   Years since quitting: 2.0  Smokeless Tobacco Never   Ready to quit: Not Answered Counseling given: Not Answered   Outpatient Medications Prior to Visit  Medication  Sig Dispense Refill   acetaminophen  (TYLENOL ) 500 MG tablet Take 1,000 mg by mouth every 6 (six) hours as needed for moderate pain.     albuterol  (VENTOLIN  HFA) 108 (90 Base) MCG/ACT inhaler Inhale 2 puffs into the lungs every 4 (four) hours as needed (Breathing).     alendronate (FOSAMAX) 70 MG tablet Take 70 mg by mouth every Friday. Take with a full glass of water  on an empty stomach.     aspirin  81 MG EC tablet Take 81 mg by mouth every morning.     atorvastatin  (LIPITOR) 10 MG tablet Take 10 mg by mouth every morning.     azithromycin  (ZITHROMAX ) 250 MG tablet Per Zpack instructions 6 tablet 0   Budeson-Glycopyrrol-Formoterol  (BREZTRI  AEROSPHERE) 160-9-4.8 MCG/ACT AERO Inhale 2 puffs into the lungs in the morning and at bedtime.     Calcium  Carb-Cholecalciferol (CALCIUM  600+D3 PO) Take 1 tablet by mouth 2 (two) times daily.     clopidogrel (PLAVIX) 75 MG tablet Take 75 mg by mouth daily.     cyanocobalamin  (,VITAMIN B-12,) 1000 MCG/ML injection Inject 1,000 mcg into the muscle every 30 (  thirty) days.     empagliflozin (JARDIANCE) 10 MG TABS tablet Take 10 mg by mouth daily.     escitalopram (LEXAPRO) 10 MG tablet Take 10 mg by mouth at bedtime.     ferrous sulfate  325 (65 FE) MG tablet Take 325 mg by mouth daily.     folic acid  (FOLVITE ) 1 MG tablet Take 1 mg by mouth every morning.     guaiFENesin  (MUCINEX ) 600 MG 12 hr tablet Take 1 tablet (600 mg total) by mouth 2 (two) times daily as needed for cough or to loosen phlegm. 30 tablet 0   ipratropium-albuterol  (DUONEB) 0.5-2.5 (3) MG/3ML SOLN Take 3 mLs by nebulization every 6 (six) hours as needed.     lisinopril -hydrochlorothiazide  (ZESTORETIC ) 20-12.5 MG tablet Take 1 tablet by mouth daily. PT TO TAKE ONLY 1/2 TABLET AT HS (PER HIS REPORT)     meloxicam (MOBIC) 15 MG tablet Take 15 mg by mouth daily.     methotrexate (RHEUMATREX) 2.5 MG tablet Take 10 mg by mouth every Tuesday. Caution:Chemotherapy. Protect from light.     nebivolol  (BYSTOLIC) 10 MG tablet Take 10 mg by mouth daily.     nitroGLYCERIN (NITROSTAT) 0.4 MG SL tablet Place 0.4 mg under the tongue every 5 (five) minutes as needed for chest pain. Up to 3 doses.     OXYGEN  Inhale 2 L into the lungs continuous.     predniSONE  (DELTASONE ) 10 MG tablet 4 tabs for 2 days, then 3 tabs for 2 days, 2 tabs for 2 days, then 1 tab for 2 days, then stop 20 tablet 0   senna-docusate (SENOKOT-S) 8.6-50 MG tablet Take 1 tablet by mouth 2 (two) times daily as needed for moderate constipation. 20 tablet 0   Skin Protectants, Misc. (EUCERIN) cream Apply 1 application. topically daily.     Vitamin D, Ergocalciferol, (DRISDOL) 1.25 MG (50000 UNIT) CAPS capsule Take 50,000 Units by mouth every Monday.     No facility-administered medications prior to visit.    Review of Systems  Review of Systems  Constitutional: Negative.   HENT:  Positive for congestion.   Respiratory:  Positive for cough and wheezing.   Cardiovascular: Negative.    Physical Exam  There were no vitals taken for this visit. Physical Exam Constitutional:      Appearance: Normal appearance. He is not ill-appearing.  HENT:     Head: Normocephalic and atraumatic.  Cardiovascular:     Rate and Rhythm: Normal rate and regular rhythm.  Pulmonary:     Effort: Pulmonary effort is normal.     Breath sounds: Rhonchi present.  Musculoskeletal:     Cervical back: Normal range of motion and neck supple.  Neurological:     General: No focal deficit present.     Mental Status: He is alert and oriented to person, place, and time. Mental status is at baseline.  Psychiatric:        Mood and Affect: Mood normal.        Behavior: Behavior normal.        Thought Content: Thought content normal.        Judgment: Judgment normal.      Lab Results:  CBC    Component Value Date/Time   WBC 18.0 (H) 10/05/2021 0653   RBC 4.91 10/05/2021 0653   HGB 14.4 10/05/2021 0653   HGB 15.3 05/04/2021 1100   HGB 16.3  03/11/2013 0847   HCT 44.5 10/05/2021 0653   HCT 48.3 03/11/2013 0847  PLT 339 10/05/2021 0653   PLT 428 (H) 05/04/2021 1100   PLT 372 Large & giant platelets 03/11/2013 0847   MCV 90.6 10/05/2021 0653   MCV 90.2 03/11/2013 0847   MCH 29.3 10/05/2021 0653   MCHC 32.4 10/05/2021 0653   RDW 18.6 (H) 10/05/2021 0653   RDW 14.7 (H) 03/11/2013 0847   LYMPHSABS 4.9 (H) 10/05/2021 0653   LYMPHSABS 3.6 (H) 03/11/2013 0847   MONOABS 1.1 (H) 10/05/2021 0653   MONOABS 2.1 (H) 03/11/2013 0847   EOSABS 0.9 (H) 10/05/2021 0653   EOSABS 0.9 (H) 03/11/2013 0847   BASOSABS 0.2 (H) 10/05/2021 0653   BASOSABS 0.2 (H) 03/11/2013 0847    BMET    Component Value Date/Time   NA 138 10/05/2021 0653   K 3.4 (L) 10/05/2021 0653   CL 101 10/05/2021 0653   CO2 29 10/05/2021 0653   GLUCOSE 98 10/05/2021 0653   BUN 7 (L) 10/05/2021 0653   CREATININE 0.84 10/05/2021 0653   CREATININE 0.94 05/04/2021 1100   CALCIUM  9.0 10/05/2021 0653   GFRNONAA >60 10/05/2021 0653   GFRNONAA >60 05/04/2021 1100    BNP No results found for: "BNP"  ProBNP No results found for: "PROBNP"  Imaging: No results found.   Assessment & Plan:   1. Cigarette smoker (Primary) - Ambulatory Referral for Lung Cancer Scre  2. Chronic bronchitis, unspecified chronic bronchitis type (HCC) - DG Chest 2 View; Future  Assessment and Plan    Chronic Obstructive Pulmonary Disease (COPD) Last AECOPD in February treated with azithromycin  and transition from nebulizers to Breztri . Symptoms are well-managed with Breztri , used twice daily. He has a chronic cough and persistent wheezing which is not new. He uses supplemental oxygen  at 2 liters and reports improved nighttime symptoms with Breztri . Discussed the use of ipratropium-albuterol  for breakthrough symptoms and budesonide  for emergency wheezing. - Continue Breztri , two puffs in the morning and two at night. - Use ipratropium-albuterol  (DuoNeb) q6 hours for breakthrough  symptoms. - Use budesonide  nebulizer BID as needed for wheezing, not daily. - Continue Mucinex  twice a day to thin mucus. - Continue using the flutter valve to help loosen congestion. - Order chest x-ray for preoperative evaluation. - Patient will be considered intermediate risk for prolonged mechanical ventilation and/or post op pulmonary complications due to COPD and chronic respiratory failure, recommend surgery be done in hospital settings. Date to be determined.   Chronic Cough Chronic cough with sputum production, limited by back pain. Mucinex  recommended to thin mucus and facilitate expectoration. - Continue Mucinex  600mg  twice a day to thin mucus. - Use flutter valve to help loosen congestion.  Respiratory Failure Respiratory failure contributes to intermediate surgical risk. Emphasized the importance of postoperative activity to prevent respiratory complications. Discussed anesthesia risks and the need for a hospital setting for surgery. - Encourage postoperative activity, including sitting up for meals and walking to prevent complications.  Nicotine  Dependence Current smoker with a history of smoking. Smoking cessation is advised to reduce surgical risks and improve healing. He smokes two to three cigarettes a day, sometimes none. Discussed the impact of smoking on surgical outcomes and healing. - Advise smoking cessation prior to surgery to reduce surgical risks and improve outcomes. - Referring to lung cancer screening program.  Follow-up Preoperative clearance and lung cancer screening discussed. Chest x-ray ordered for preoperative evaluation. Referral to lung cancer screening program due to smoking history. - Order chest x-ray for preoperative evaluation. - Refer to lung cancer screening program.  Antonio Baumgarten, NP 07/20/2023

## 2023-07-20 NOTE — Telephone Encounter (Signed)
 What order are you referring to?  I have signed everything that I know of

## 2023-07-20 NOTE — Telephone Encounter (Signed)
 Copied from CRM [id number]. Topic: General - Other >> Jul 16, 2023 10:39 AM Hilton Lucky wrote: Reason for CRM: Merritt Ables with [facility] [employer] is calling in to verify reception of a fax order sent on 04/26. Order not available in system in media tab but advised received per encounter note from 05/06. Requesting signature and return of this order. F: [phone]. >> Jul 17, 2023 11:33 AM Manon Seidel J wrote: Can you check to see if [facility] NP has signed this? We have not received it back as of yet.   I can route this to Ms Sueanne Emerald. I tried calling [facility] and I was unable to reach anyone or leave a vm. No call back number provided. I also tried calling pt and the line is either down or busy.

## 2023-07-20 NOTE — Progress Notes (Signed)
 Patient does not have mychart. Please let him know CXR showed chronic bronchial thickening and unchanged scarring. No acute findings.

## 2023-07-20 NOTE — Patient Instructions (Signed)
-  CHRONIC OBSTRUCTIVE PULMONARY DISEASE (COPD): COPD is a chronic lung disease that makes it hard to breathe. You should continue using Breztri  twice daily, use ipratropium-albuterol  (DuoNeb) for breakthrough symptoms, and budesonide  as needed for wheezing. Keep taking Mucinex  twice a day to thin mucus and use the flutter valve to help with congestion. A chest x-ray has been ordered for your preoperative evaluation.  -CHRONIC COUGH: Your chronic cough is producing phlegm and is worsened by back pain. Continue taking Mucinex  twice a day to thin the mucus and use the flutter valve to help loosen congestion.  -RESPIRATORY FAILURE: Respiratory failure means your lungs are not getting enough oxygen  into your blood. This increases your surgical risk. It is important to stay active after surgery to prevent complications. Make sure to sit up for meals and walk as much as possible.  -NICOTINE  DEPENDENCE: Nicotine  dependence means you are addicted to smoking. Quitting smoking is advised to reduce surgical risks and improve healing. You currently smoke two to three cigarettes a day. Stopping smoking before your surgery will help improve your outcomes.  INSTRUCTIONS: A chest x-ray has been ordered for your preoperative evaluation. You will also be referred to a lung cancer screening program due to your smoking history.  Orders: CXR today- pre op   Follow-up: 3 months with Jerlene Moody NP

## 2023-07-23 ENCOUNTER — Telehealth: Payer: Self-pay | Admitting: Primary Care

## 2023-07-23 NOTE — Telephone Encounter (Signed)
 CMN received from [facility] for oxygen  therapy.

## 2023-07-24 DIAGNOSIS — S22070A Wedge compression fracture of T9-T10 vertebra, initial encounter for closed fracture: Secondary | ICD-10-CM | POA: Diagnosis not present

## 2023-07-24 NOTE — Telephone Encounter (Signed)
 Rc'd signed copy from Ms. Sueanne Emerald. Will fax with Chart Notes to [phone]

## 2023-07-25 DIAGNOSIS — J449 Chronic obstructive pulmonary disease, unspecified: Secondary | ICD-10-CM | POA: Diagnosis not present

## 2023-07-25 NOTE — Telephone Encounter (Signed)
 I called and spoke to pt's wife, Thersia Flax. [facility]) Thersia Flax stated that the pt is not missing any orders and they were just waiting to hear back from someone about a piece on the POC. Thersia Flax stated everything else is fine. NFN

## 2023-07-26 DIAGNOSIS — Z79899 Other long term (current) drug therapy: Secondary | ICD-10-CM | POA: Diagnosis not present

## 2023-07-26 DIAGNOSIS — E1169 Type 2 diabetes mellitus with other specified complication: Secondary | ICD-10-CM | POA: Diagnosis not present

## 2023-07-26 DIAGNOSIS — Z6837 Body mass index (BMI) 37.0-37.9, adult: Secondary | ICD-10-CM | POA: Diagnosis not present

## 2023-07-26 DIAGNOSIS — E785 Hyperlipidemia, unspecified: Secondary | ICD-10-CM | POA: Diagnosis not present

## 2023-07-26 DIAGNOSIS — Z09 Encounter for follow-up examination after completed treatment for conditions other than malignant neoplasm: Secondary | ICD-10-CM | POA: Diagnosis not present

## 2023-07-26 DIAGNOSIS — D72829 Elevated white blood cell count, unspecified: Secondary | ICD-10-CM | POA: Diagnosis not present

## 2023-08-07 ENCOUNTER — Other Ambulatory Visit: Payer: Self-pay | Admitting: Orthopedic Surgery

## 2023-08-07 DIAGNOSIS — S22000A Wedge compression fracture of unspecified thoracic vertebra, initial encounter for closed fracture: Secondary | ICD-10-CM

## 2023-08-07 NOTE — Progress Notes (Signed)
 Chief Complaint: Patient was seen in consultation today for T5 and T9 compression fractures  Referring Physician(s): Gioffre,Ronald  History of Present Illness: Nathan Nicholson is a 68 y.o. male with a medical history significant for severe COPD (Oxygen  dependent), current tobacco use, HTN, CAD/MI (Plavix), RA (Felty's syndrome; s/p splenectomy), chronic leukocytosis, DM2, obesity and multiple compression fractures. He is wheelchair bound. He is familiar to IR from vertebral body augmentations at T11/T12 (06/17/21), L1/L2 (07/11/21) and L3 (10/05/21).   He was recently evaluated by Ortho for thoracic/lumbar pain that started suddenly. An MRI was ordered and this showed a new, acute or subacute T5 compression fracture with 40% loss of height and marrow edema. A chronic T9 compression fracture as well as multiple other chronic findings throughout the vertebrae were identified.   The patient has been referred to Interventional Radiology to discuss vertebral body augmentation. His pain is severe, at a 9/10.  He is taking percocet daily, which makes him feel like he's going to pass out.  He scores a 14/24 today on the [employer] questionnaire.    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 Zonia Hire by hematologist Dr. Scherrie Curt   Myocardial infarction [facility])    Oxygen  dependent    2L via [place]   Pneumonia    Rheumatoid arthritis (HCC)     Past Surgical History:  Procedure Laterality Date   FOOT SURGERY     left   HERNIA REPAIR     IR KYPHO EA ADDL LEVEL THORACIC OR LUMBAR  06/17/2021   IR KYPHO EA ADDL LEVEL THORACIC OR LUMBAR  07/12/2021   IR KYPHO LUMBAR INC FX REDUCE BONE BX UNI/BIL CANNULATION INC/IMAGING  07/11/2021   IR KYPHO LUMBAR INC  FX REDUCE BONE BX UNI/BIL CANNULATION INC/IMAGING  10/05/2021   IR KYPHO THORACIC WITH BONE BIOPSY  06/17/2021   IR RADIOLOGIST EVAL & MGMT  05/18/2021   MULTIPLE TOOTH EXTRACTIONS     ALL TEETH EXT, DENTURES   RADIOLOGY WITH ANESTHESIA N/A 06/17/2021   Procedure: KYPHOPLASTY;  Surgeon: de Macedo Rodrigues, Katyucia, MD;  Location: [facility] OR;  Service: Radiology;  Laterality: N/A;   RADIOLOGY WITH ANESTHESIA N/A 07/11/2021   Procedure: KYPHOPLASTY;  Surgeon: de Macedo Rodrigues, Katyucia, MD;  Location: [facility] OR;  Service: Radiology;  Laterality: N/A;   RADIOLOGY WITH ANESTHESIA N/A 10/05/2021   Procedure: L3 Kyphoplasty;  Surgeon: de Macedo Rodrigues, Katyucia, MD;  Location: [facility] OR;  Service: Radiology;  Laterality: N/A;   SPLENECTOMY     TOOTH EXTRACTION     TOTAL HIP ARTHROPLASTY Right 08/17/2020   Procedure: TOTAL HIP ARTHROPLASTY ANTERIOR APPROACH;  Surgeon: Claiborne Crew, MD;  Location: [facility] ORS;  Service: Orthopedics;  Laterality: Right;  70 min    Allergies: Patient has no known allergies.  Medications: Prior to Admission medications   Medication Sig Start Date End Date Taking? Authorizing Provider  acetaminophen  (TYLENOL ) 500 MG tablet Take 1,000 mg by mouth every 6 (six) hours as needed for moderate pain.    [provider]  albuterol  (VENTOLIN  HFA) 108 (90 Base) MCG/ACT inhaler Inhale 2 puffs into the lungs every 4 (four) hours as needed (Breathing). 04/15/21   [provider]  alendronate (FOSAMAX)  70 MG tablet Take 70 mg by mouth every Friday. Take with a full glass of water  on an empty stomach.    [provider]  aspirin  81 MG EC tablet Take 81 mg by mouth every morning.    [provider]  atorvastatin  (LIPITOR) 10 MG tablet Take 10 mg by mouth every morning.    [provider]  azithromycin  (ZITHROMAX ) 250 MG tablet Per Zpack instructions 04/20/23   Antonio Baumgarten, NP  Budeson-Glycopyrrol-Formoterol  (BREZTRI  AEROSPHERE) 160-9-4.8 MCG/ACT  AERO Inhale 2 puffs into the lungs in the morning and at bedtime. 04/20/23   Antonio Baumgarten, NP  Calcium  Carb-Cholecalciferol (CALCIUM  600+D3 PO) Take 1 tablet by mouth 2 (two) times daily.    [provider]  clopidogrel (PLAVIX) 75 MG tablet Take 75 mg by mouth daily. 01/23/22   [provider]  cyanocobalamin  (,VITAMIN B-12,) 1000 MCG/ML injection Inject 1,000 mcg into the muscle every 30 (thirty) days.    [provider]  empagliflozin (JARDIANCE) 10 MG TABS tablet Take 10 mg by mouth daily.    [provider]  escitalopram (LEXAPRO) 10 MG tablet Take 10 mg by mouth at bedtime.    [provider]  ferrous sulfate  325 (65 FE) MG tablet Take 325 mg by mouth daily.    [provider]  folic acid  (FOLVITE ) 1 MG tablet Take 1 mg by mouth every morning.    [provider]  guaiFENesin  (MUCINEX ) 600 MG 12 hr tablet Take 1 tablet (600 mg total) by mouth 2 (two) times daily as needed for cough or to loosen phlegm. 04/20/23   Antonio Baumgarten, NP  ipratropium-albuterol  (DUONEB) 0.5-2.5 (3) MG/3ML SOLN Take 3 mLs by nebulization every 6 (six) hours as needed. 04/20/23   Antonio Baumgarten, NP  lisinopril -hydrochlorothiazide  (ZESTORETIC ) 20-12.5 MG tablet Take 1 tablet by mouth daily. PT TO TAKE ONLY 1/2 TABLET AT HS (PER HIS REPORT)    [provider]  meloxicam (MOBIC) 15 MG tablet Take 15 mg by mouth daily.    [provider]  methotrexate (RHEUMATREX) 2.5 MG tablet Take 10 mg by mouth every Tuesday. Caution:Chemotherapy. Protect from light.    [provider]  nebivolol (BYSTOLIC) 10 MG tablet Take 10 mg by mouth daily. 01/23/22   [provider]  nitroGLYCERIN (NITROSTAT) 0.4 MG SL tablet Place 0.4 mg under the tongue every 5 (five) minutes as needed for chest pain. Up to 3 doses. 01/23/22   [provider]  oxyCODONE -acetaminophen  (PERCOCET) 10-325 MG tablet Take 1 tablet by mouth every 8  (eight) hours as needed for pain. For only 10 days    [provider]  OXYGEN  Inhale 2 L into the lungs continuous.    [provider]  predniSONE  (DELTASONE ) 20 MG tablet Take 20 mg by mouth in the morning and at bedtime.    [provider]  senna-docusate (SENOKOT-S) 8.6-50 MG tablet Take 1 tablet by mouth 2 (two) times daily as needed for moderate constipation. 05/13/21   Bary Boss, DO  Skin Protectants, Misc. (EUCERIN) cream Apply 1 application. topically daily.    [provider]  Vitamin D, Ergocalciferol, (DRISDOL) 1.25 MG (50000 UNIT) CAPS capsule Take 50,000 Units by mouth every Monday.    [provider]     Family History  Problem Relation Age of Onset   Heart attack Brother     Social History   Socioeconomic History   Marital status: Married    Spouse  name: Zyrion Coey   Number of children: 2   Years of education: Not on file   Highest education level: Not on file  Occupational History   Not on file  Tobacco Use   Smoking status: Every Day    Current packs/day: 0.00    Average packs/day: 0.5 packs/day for 40.0 years (20.0 ttl pk-yrs)    Types: Cigarettes    Start date: 06/28/1981    Last attempt to quit: 06/28/2021    Years since quitting: 2.1   Smokeless tobacco: Never   Tobacco comments:    Pt smokes 3-4 cigs a day. AB, CMA 07-20-23  Vaping Use   Vaping status: Never Used  Substance and Sexual Activity   Alcohol use: Not Currently    Comment: rare   Drug use: Never   Sexual activity: Not Currently  Other Topics Concern   Not on file  Social History Narrative   Not on file   Social Drivers of Health   Financial Resource Strain: Low Risk  (08/10/2022)   Overall Financial Resource Strain (CARDIA)    Difficulty of Paying Living Expenses: Not hard at all  Food Insecurity: No Food Insecurity (08/10/2022)   Hunger Vital Sign    Worried About Running Out of Food in the Last Year: Never true    Ran Out of Food in  the Last Year: Never true  Transportation Needs: No Transportation Needs (08/10/2022)   PRAPARE - Administrator, Civil Service (Medical): No    Lack of Transportation (Non-Medical): No  Physical Activity: Insufficiently Active (08/10/2022)   Exercise Vital Sign    Days of Exercise per Week: 7 days    Minutes of Exercise per Session: 10 min  Stress: No Stress Concern Present (07/27/2021)   [employer] of Occupational Health - Occupational Stress Questionnaire    Feeling of Stress : Not at all  Social Connections: Moderately Isolated (05/01/2022)   Social Connection and Isolation Panel [NHANES]    Frequency of Communication with Friends and Family: Not on file    Frequency of Social Gatherings with Friends and Family: Three times a week    Attends Religious Services: Never    Active Member of Clubs or Organizations: No    Attends Banker Meetings: Never    Marital Status: Married    Review of Systems: A 12 point ROS discussed and pertinent positives are indicated in the HPI above.  All other systems are negative.  Vital Signs: There were no vitals taken for this visit.  Advance Care Plan: The advanced care plan/surrogate decision maker was discussed at the time of visit and documented in the medical record.    Physical Exam Constitutional:      General: He is not in acute distress. HENT:     Head: Normocephalic.     Mouth/Throat:     Mouth: Mucous membranes are moist.  Eyes:     General: No scleral icterus. Cardiovascular:     Rate and Rhythm: Normal rate and regular rhythm.  Pulmonary:     Effort: Pulmonary effort is normal.  Abdominal:     General: There is no distension.  Musculoskeletal:       Arms:     Comments: Tender to palpation.  Skin:    General: Skin is warm and dry.  Neurological:     Mental Status: He is alert and oriented to person, place, and time.     Imaging: MR Thoracic spine 07/19/23   Labs:  CBC: No results for  input(s): "WBC", "HGB", "HCT", "PLT" in the last 8760 hours.  COAGS: No results for input(s): "INR", "APTT" in the last 8760 hours.  BMP: No results for input(s): "NA", "K", "CL", "CO2", "GLUCOSE", "BUN", "CALCIUM ", "CREATININE", "GFRNONAA", "GFRAA" in the last 8760 hours.  Invalid input(s): "CMP"  LIVER FUNCTION TESTS: No results for input(s): "BILITOT", "AST", "ALT", "ALKPHOS", "PROT", "ALBUMIN" in the last 8760 hours.  TUMOR MARKERS: No results for input(s): "AFPTM", "CEA", "CA199", "CHROMGRNA" in the last 8760 hours.  Assessment & Plan:   Patient has suffered subacute osteoporotic fracture of the T5 vertebra.   History and exam have demonstrated the following:  Acute/Subacute fracture by imaging dated 07/19/23, Pain on exam concordant with level of fracture, Failure of conservative therapy and pain refractory to narcotic pain mediation, and Significant disability on the [employer] Disability Questionnaire with 14/24 positive symptoms, reflecting significant impact/impairment of (ADLs)   ICD-10-CM Codes that Support Medical Necessity ([email or web address].[email or web address]?[email or web address])  M80.08XA    Age-related osteoporosis with current pathological fracture, vertebra(e), initial encounter for fracture   Plan:  T5 vertebral body augmentation with balloon kyphoplasty  Post-procedure disposition: outpatient [facility]  Medication holds: ASA and Plavix, 5 days  The patient has suffered a fracture of the T5 vertebral body. It is recommended that patients aged 47 years or older be evaluated for possible testing or treatment of osteoporosis. A copy of this consult report is sent to the patient's referring physician.  Advanced Care Plan: The patient has an Advanced Care Plan or Surrogate Decision Maker documented in the EMR     Total time spent on today's visit was over  40 Minutes, including both face-to-face time and non face-to-face time,  personally spent on review of chart (including labs and relevant imaging), discussing further workup and treatment options, referral to specialist if needed, reviewing outside records if pertinent, answering patient questions, and coordinating care regarding T5 compression fracture as well as management strategy.    Creasie Doctor, MD Pager: [phone]    I spent a total of  40 Minutes   in face to face in clinical consultation, greater than 50% of which was counseling/coordinating care for compression fractures.

## 2023-08-08 ENCOUNTER — Ambulatory Visit
Admission: RE | Admit: 2023-08-08 | Discharge: 2023-08-08 | Disposition: A | Discharge status: Home / Self Care | Source: Ambulatory Visit | Attending: Orthopedic Surgery | Admitting: Orthopedic Surgery

## 2023-08-08 DIAGNOSIS — X58XXXA Exposure to other specified factors, initial encounter: Secondary | ICD-10-CM | POA: Diagnosis not present

## 2023-08-08 DIAGNOSIS — S22050A Wedge compression fracture of T5-T6 vertebra, initial encounter for closed fracture: Secondary | ICD-10-CM | POA: Diagnosis not present

## 2023-08-08 DIAGNOSIS — S22000A Wedge compression fracture of unspecified thoracic vertebra, initial encounter for closed fracture: Secondary | ICD-10-CM

## 2023-08-08 DIAGNOSIS — M8008XA Age-related osteoporosis with current pathological fracture, vertebra(e), initial encounter for fracture: Secondary | ICD-10-CM | POA: Diagnosis not present

## 2023-08-08 HISTORY — PX: IR RADIOLOGIST EVAL & MGMT: IMG5224

## 2023-08-09 DIAGNOSIS — J441 Chronic obstructive pulmonary disease with (acute) exacerbation: Secondary | ICD-10-CM | POA: Diagnosis not present

## 2023-08-09 DIAGNOSIS — M8589 Other specified disorders of bone density and structure, multiple sites: Secondary | ICD-10-CM | POA: Diagnosis not present

## 2023-08-09 DIAGNOSIS — M810 Age-related osteoporosis without current pathological fracture: Secondary | ICD-10-CM | POA: Diagnosis not present

## 2023-08-09 DIAGNOSIS — Z13820 Encounter for screening for osteoporosis: Secondary | ICD-10-CM | POA: Diagnosis not present

## 2023-08-09 DIAGNOSIS — J449 Chronic obstructive pulmonary disease, unspecified: Secondary | ICD-10-CM | POA: Diagnosis not present

## 2023-08-09 DIAGNOSIS — J9601 Acute respiratory failure with hypoxia: Secondary | ICD-10-CM | POA: Diagnosis not present

## 2023-08-10 DIAGNOSIS — M545 Low back pain, unspecified: Secondary | ICD-10-CM | POA: Diagnosis not present

## 2023-08-10 DIAGNOSIS — M0579 Rheumatoid arthritis with rheumatoid factor of multiple sites without organ or systems involvement: Secondary | ICD-10-CM | POA: Diagnosis not present

## 2023-08-10 DIAGNOSIS — E669 Obesity, unspecified: Secondary | ICD-10-CM | POA: Diagnosis not present

## 2023-08-10 DIAGNOSIS — M25561 Pain in right knee: Secondary | ICD-10-CM | POA: Diagnosis not present

## 2023-08-10 DIAGNOSIS — M1991 Primary osteoarthritis, unspecified site: Secondary | ICD-10-CM | POA: Diagnosis not present

## 2023-08-10 DIAGNOSIS — M8080XD Other osteoporosis with current pathological fracture, unspecified site, subsequent encounter for fracture with routine healing: Secondary | ICD-10-CM | POA: Diagnosis not present

## 2023-08-10 DIAGNOSIS — Z6837 Body mass index (BMI) 37.0-37.9, adult: Secondary | ICD-10-CM | POA: Diagnosis not present

## 2023-08-14 ENCOUNTER — Other Ambulatory Visit: Payer: Self-pay | Admitting: Interventional Radiology

## 2023-08-14 DIAGNOSIS — S22050A Wedge compression fracture of T5-T6 vertebra, initial encounter for closed fracture: Secondary | ICD-10-CM

## 2023-08-14 NOTE — H&P (Signed)
 Chief Complaint: Patient was seen in consultation today for T5 compression fx  at the request of Gioffre,Ronald   Referring Physician(s): Gioffre,Ronald   Supervising Physician: Creasie Doctor  Patient Status: [facility] - Out-pt  History of Present Illness: Nathan Nicholson is a 68 y.o. male with PMHs of HTN, COPD, MI on ASA/Plavix, RA (Felty's syndrome; s/p splenectomy), chronic leukocytosis, DM2, obesity and multiple compression fractures who presents for T5 KP.   Patient is known to IR for previous vertebral body augmentations at T11/T12 (06/17/21), L1/L2 (07/11/21) and L3 (10/05/21).   Patient was referred to Dr. Jinx Mourning by his orthopedist  to discuss about T5 and T9 compression fx, had consultation visit with Dr. Jinx Mourning on 08/08/23. MR was reviewed bt Dr. Jinx Mourning and patient has acute T5 compression fx and chronic T9 comression fx, the patient was deemed good candidate for T5 KP.  After thorough discussion and shared decision making, patient decided to proceed with T5 KP.   Patient laying in bed, not in acute distress.  Denise headache, fever, chills, shortness of breath, cough, chest pain, abdominal pain, nausea ,vomiting, symptoms of UTI and bleeding.  He is nervous that the procedure will be done with moderate sedation, but will give it a try. He understands and he can stop the procedure at anytime and will be rescheduled with GA.    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 Zonia Hire by hematologist Dr. Scherrie Curt   Myocardial infarction [facility])    Oxygen  dependent    2L via [place]   Pneumonia    Rheumatoid arthritis (HCC)     Past Surgical History:  Procedure Laterality Date   FOOT SURGERY     left   HERNIA REPAIR     IR KYPHO EA ADDL LEVEL  THORACIC OR LUMBAR  06/17/2021   IR KYPHO EA ADDL LEVEL THORACIC OR LUMBAR  07/12/2021   IR KYPHO LUMBAR INC FX REDUCE BONE BX UNI/BIL CANNULATION INC/IMAGING  07/11/2021   IR KYPHO LUMBAR INC FX REDUCE BONE BX UNI/BIL CANNULATION INC/IMAGING  10/05/2021   IR KYPHO THORACIC WITH BONE BIOPSY  06/17/2021   IR RADIOLOGIST EVAL & MGMT  05/18/2021   IR RADIOLOGIST EVAL & MGMT  08/08/2023   MULTIPLE TOOTH EXTRACTIONS     ALL TEETH EXT, DENTURES   RADIOLOGY WITH ANESTHESIA N/A 06/17/2021   Procedure: KYPHOPLASTY;  Surgeon: de Macedo Rodrigues, Katyucia, MD;  Location: [facility] OR;  Service: Radiology;  Laterality: N/A;   RADIOLOGY WITH ANESTHESIA N/A 07/11/2021   Procedure: KYPHOPLASTY;  Surgeon: de Macedo Rodrigues, Katyucia, MD;  Location: [facility] OR;  Service: Radiology;  Laterality: N/A;   RADIOLOGY WITH ANESTHESIA N/A 10/05/2021   Procedure: L3 Kyphoplasty;  Surgeon: de Macedo Rodrigues, Katyucia, MD;  Location: [facility] OR;  Service: Radiology;  Laterality: N/A;   SPLENECTOMY     TOOTH EXTRACTION     TOTAL HIP ARTHROPLASTY Right 08/17/2020   Procedure: TOTAL HIP ARTHROPLASTY ANTERIOR APPROACH;  Surgeon: Claiborne Crew, MD;  Location: [facility] ORS;  Service: Orthopedics;  Laterality: Right;  70 min    Allergies: No known allergies  Medications: Prior to Admission medications   Medication Sig Start Date End Date Taking? Authorizing Provider  acetaminophen  (TYLENOL ) 500 MG tablet  Take 1,000 mg by mouth every 6 (six) hours as needed for moderate pain.    [provider]  albuterol  (VENTOLIN  HFA) 108 (90 Base) MCG/ACT inhaler Inhale 2 puffs into the lungs every 4 (four) hours as needed (Breathing). 04/15/21   [provider]  alendronate (FOSAMAX) 70 MG tablet Take 70 mg by mouth every Friday. Take with a full glass of water  on an empty stomach.    [provider]  aspirin  81 MG EC tablet Take 81 mg by mouth every morning.    [provider]  atorvastatin  (LIPITOR) 10 MG tablet Take 10 mg by  mouth every morning.    [provider]  azithromycin  (ZITHROMAX ) 250 MG tablet Per Zpack instructions 04/20/23   Antonio Baumgarten, NP  Budeson-Glycopyrrol-Formoterol  (BREZTRI  AEROSPHERE) 160-9-4.8 MCG/ACT AERO Inhale 2 puffs into the lungs in the morning and at bedtime. 04/20/23   Antonio Baumgarten, NP  Calcium  Carb-Cholecalciferol (CALCIUM  600+D3 PO) Take 1 tablet by mouth 2 (two) times daily.    [provider]  clopidogrel (PLAVIX) 75 MG tablet Take 75 mg by mouth daily. 01/23/22   [provider]  cyanocobalamin  (,VITAMIN B-12,) 1000 MCG/ML injection Inject 1,000 mcg into the muscle every 30 (thirty) days.    [provider]  empagliflozin (JARDIANCE) 10 MG TABS tablet Take 10 mg by mouth daily.    [provider]  escitalopram (LEXAPRO) 10 MG tablet Take 10 mg by mouth at bedtime.    [provider]  ferrous sulfate  325 (65 FE) MG tablet Take 325 mg by mouth daily.    [provider]  folic acid  (FOLVITE ) 1 MG tablet Take 1 mg by mouth every morning.    [provider]  guaiFENesin  (MUCINEX ) 600 MG 12 hr tablet Take 1 tablet (600 mg total) by mouth 2 (two) times daily as needed for cough or to loosen phlegm. 04/20/23   Antonio Baumgarten, NP  ipratropium-albuterol  (DUONEB) 0.5-2.5 (3) MG/3ML SOLN Take 3 mLs by nebulization every 6 (six) hours as needed. 04/20/23   Antonio Baumgarten, NP  lisinopril -hydrochlorothiazide  (ZESTORETIC ) 20-12.5 MG tablet Take 1 tablet by mouth daily. PT TO TAKE ONLY 1/2 TABLET AT HS (PER HIS REPORT)    [provider]  meloxicam (MOBIC) 15 MG tablet Take 15 mg by mouth daily.    [provider]  methotrexate (RHEUMATREX) 2.5 MG tablet Take 10 mg by mouth every Tuesday. Caution:Chemotherapy. Protect from light.    [provider]  nebivolol (BYSTOLIC) 10 MG tablet Take 10 mg by mouth daily. 01/23/22   [provider]  nitroGLYCERIN (NITROSTAT) 0.4 MG SL tablet  Place 0.4 mg under the tongue every 5 (five) minutes as needed for chest pain. Up to 3 doses. 01/23/22   [provider]  oxyCODONE -acetaminophen  (PERCOCET) 10-325 MG tablet Take 1 tablet by mouth every 8 (eight) hours as needed for pain. For only 10 days    [provider]  OXYGEN  Inhale 2 L into the lungs continuous.    [provider]  predniSONE  (DELTASONE ) 20 MG tablet Take 20 mg by mouth in the morning and at bedtime.    [provider]  senna-docusate (SENOKOT-S) 8.6-50 MG tablet Take 1 tablet by mouth 2 (two) times daily as needed for moderate constipation. 05/13/21   Bary Boss, DO  Skin Protectants, Misc. (EUCERIN) cream Apply 1 application. topically daily.    [provider]  Vitamin D, Ergocalciferol, (DRISDOL) 1.25 MG (50000 UNIT)  CAPS capsule Take 50,000 Units by mouth every Monday.    [provider]     Family History  Problem Relation Age of Onset   Heart attack Brother     Social History   Socioeconomic History   Marital status: Married    Spouse name: Pius Byrom   Number of children: 2   Years of education: Not on file   Highest education level: Not on file  Occupational History   Not on file  Tobacco Use   Smoking status: Every Day    Current packs/day: 0.00    Average packs/day: 0.5 packs/day for 40.0 years (20.0 ttl pk-yrs)    Types: Cigarettes    Start date: 06/28/1981    Last attempt to quit: 06/28/2021    Years since quitting: 2.1   Smokeless tobacco: Never   Tobacco comments:    Pt smokes 3-4 cigs a day. AB, CMA 07-20-23  Vaping Use   Vaping status: Never Used  Substance and Sexual Activity   Alcohol use: Not Currently    Comment: rare   Drug use: Never   Sexual activity: Not Currently  Other Topics Concern   Not on file  Social History Narrative   Not on file   Social Drivers of Health   Financial Resource Strain: Low Risk  (08/10/2022)   Overall Financial Resource Strain (CARDIA)     Difficulty of Paying Living Expenses: Not hard at all  Food Insecurity: No Food Insecurity (08/10/2022)   Hunger Vital Sign    Worried About Running Out of Food in the Last Year: Never true    Ran Out of Food in the Last Year: Never true  Transportation Needs: No Transportation Needs (08/10/2022)   PRAPARE - Administrator, Civil Service (Medical): No    Lack of Transportation (Non-Medical): No  Physical Activity: Insufficiently Active (08/10/2022)   Exercise Vital Sign    Days of Exercise per Week: 7 days    Minutes of Exercise per Session: 10 min  Stress: No Stress Concern Present (07/27/2021)   [employer] of Occupational Health - Occupational Stress Questionnaire    Feeling of Stress : Not at all  Social Connections: Moderately Isolated (05/01/2022)   Social Connection and Isolation Panel [NHANES]    Frequency of Communication with Friends and Family: Not on file    Frequency of Social Gatherings with Friends and Family: Three times a week    Attends Religious Services: Never    Active Member of Clubs or Organizations: No    Attends Banker Meetings: Never    Marital Status: Married     Review of Systems: A 12 point ROS discussed and pertinent positives are indicated in the HPI above.  All other systems are negative.  Advance Care Plan: The advanced care plan/surrogate decision maker was discussed at the time of visit and documented in the medical record.   Vital Signs: BP (!) 141/86   Pulse 88   Temp 98.2 F (36.8 C) (Oral)   Resp (!) 22   Ht 5' 3 (1.6 m)   Wt 203 lb (92.1 kg)   SpO2 95%   BMI 35.96 kg/m    Physical Exam Vitals reviewed.  Constitutional:      General: He is not in acute distress.    Appearance: He is not ill-appearing.  HENT:     Head: Normocephalic and atraumatic.     Mouth/Throat:     Mouth: Mucous membranes are moist.  Pharynx: Oropharynx is clear.  Cardiovascular:     Rate and Rhythm: Normal rate and regular  rhythm.     Heart sounds: Normal heart sounds.  Pulmonary:     Effort: Pulmonary effort is normal.     Breath sounds: Normal breath sounds.  Abdominal:     General: Abdomen is flat. Bowel sounds are normal.     Palpations: Abdomen is soft.  Musculoskeletal:        General: Tenderness present.     Cervical back: Neck supple.     Comments: TTP mid upper back   Skin:    General: Skin is warm and dry.     Coloration: Skin is not jaundiced or pale.  Neurological:     Mental Status: He is alert and oriented to person, place, and time.  Psychiatric:        Behavior: Behavior normal.        Judgment: Judgment normal.     MD Evaluation Airway: WNL Heart: WNL Abdomen: WNL Chest/ Lungs: WNL ASA  Classification: 3 Mallampati/Airway Score: Two  Imaging: IR Radiologist Eval & Mgmt Result Date: 08/08/2023 EXAM: NEW PATIENT OFFICE VISIT CHIEF COMPLAINT: See Epic note. HISTORY OF PRESENT ILLNESS: See Epic note. REVIEW OF SYSTEMS: See Epic note. PHYSICAL EXAMINATION: See Epic note. ASSESSMENT AND PLAN: See Epic note. Creasie Doctor, MD [facility] Electronically Signed   By: Creasie Doctor M.D.   On: 08/08/2023 12:13   MR THORACIC SPINE WO CONTRAST Result Date: 07/27/2023 CLINICAL DATA:  68 year old male with spinal compression fractures. Previous augmentation. EXAM: MRI THORACIC SPINE WITHOUT CONTRAST TECHNIQUE: Multiplanar, multisequence MR imaging of the thoracic spine was performed. No intravenous contrast was administered. COMPARISON:  Chest CTA 03/07/2023. Previous thoracic MRI 04/19/2021. FINDINGS: Limited cervical spine imaging: Cervical spine disc and endplate degeneration not definitely changed from the 2023 comparison. Partially visible C5-C6 and C6-C7 multifactorial degenerative spinal stenosis on the thoracic sagittal images (such as series 3, image 11). Thoracic spine segmentation: Normal on the January CTA. Same numbering system  as on the 2023 MRI. Alignment: Stable exaggerated kyphosis at the thoracolumbar junction since the January CTA, mildly progressed since 2023. No significant scoliosis or spondylolisthesis. Vertebrae: Chronic vertebral body compression fractures T9 through L3. Previous augmentation of T11 through L3 visible on the January CT. Subtle chronic endplate compression also T1 through T3. New since January T5 moderate compression fracture with superior endplate deformity, 40% loss of height and intense vertebral body marrow edema (series 4, image 10). No retropulsion. Marrow edema tracks into both T5 pedicles but the posterior elements appear to remain intact and aligned. Minimal T4 inferior endplate marrow edema which appears likely reactive, slightly asymmetric to the left. T6 through T8 appears stable and intact. No other marrow edema or acute osseous abnormality. Cord: Normal T1 through T11. There is mild chronic mass effect on the lower thoracic cord/conus at T12 related to chronic retropulsion there which appears stable since 2023. No cord or conus signal abnormality. Paraspinal and other soft tissues: Stable, negative. Disc levels: Solitary T2 weighted axial images. No significant progression of thoracic spine degeneration T1 through T8 since the 2023 MRI. Disc bulging and facet hypertrophy have progressed accompanying new but chronic T9 and T10 compression fractures. And severe left T10 neural foraminal stenosis due to combined compression and bulky facet arthropathy is chronic but progressed since 2023 (series 3, image 16). Mild contralateral right T9 neural foraminal stenosis is stable related to chronic facet hypertrophy. Isolated  stable and mild chronic spinal stenosis at T12 from retropulsion as stated above. IMPRESSION: 1. New since January acute or subacute T5 compression fracture with 40% loss of height and marrow edema. No retropulsion or other complicating features. 2. Chronic T9 through upper lumbar  compression fractures with previous augmentation of head least T11 through L3. Subtle chronic upper thoracic endplate compression T1 through T3. 3. Chronic T12 retropulsion with mild spinal stenosis and mild mass effect on the conus there is stable from a 2023 MRI. Chronic but progressed and now Severe left T10 neural foraminal stenosis related to combined T10 compression and bulky chronic facet arthropathy there. 4. Partially visible cervical spine degeneration with evidence of cervical spinal stenosis at C5-C6 and C6-C7. Electronically Signed   By: Marlise Simpers M.D.   On: 07/27/2023 11:00   DG Chest 2 View Result Date: 07/20/2023 CLINICAL DATA:  Chronic bronchitis.  Preop. EXAM: CHEST - 2 VIEW COMPARISON:  Chest radiograph 07/14/2023 FINDINGS: Stable heart size and mediastinal contours. Chronic peribronchial thickening. Left lung base scarring is unchanged from prior. No acute airspace disease. No pleural fluid or pneumothorax. Exaggerated thoracic kyphosis with multilevel kyphoplasty in the lower thoracic and upper lumbar spine. Additional lower thoracic compression deformities which are assessed on yesterday's thoracic MRI. IMPRESSION: Chronic peribronchial thickening and left lung base scarring. No acute findings. Electronically Signed   By: Chadwick Colonel M.D.   On: 07/20/2023 12:34    Labs:  CBC: Recent Labs    08/15/23 1325  WBC 19.9*  HGB 14.7  HCT 45.1  PLT 378    COAGS: Recent Labs    08/15/23 1325  INR 1.0    BMP: No results for input(s): NA, K, CL, CO2, GLUCOSE, BUN, CALCIUM , CREATININE, GFRNONAA, GFRAA in the last 8760 hours.  Invalid input(s): CMP  LIVER FUNCTION TESTS: No results for input(s): BILITOT, AST, ALT, ALKPHOS, PROT, ALBUMIN in the last 8760 hours.  TUMOR MARKERS: No results for input(s): AFPTM, CEA, CA199, CHROMGRNA in the last 8760 hours.  Assessment and Plan: 68 y.o. male with acute T5 compression fx who presents  for KP.   NPO since MN VSS CBC chronic  leukocytosis, on steroid  INR 1.0  On ASA 81 mg and Plavix 75 mg, have been held for 5 days  NKDA 2g ancef    Risks and benefits of T5 KP were discussed with the patient including, but not limited to education regarding the natural healing process of compression fractures without intervention, bleeding, infection, cement migration which may cause spinal cord damage, paralysis, pulmonary embolism or even death.  This interventional procedure involves the use of X-rays and because of the nature of the planned procedure, it is possible that we will have prolonged use of X-ray fluoroscopy.  Potential radiation risks to you include (but are not limited to) the following: - A slightly elevated risk for cancer  several years later in life. This risk is typically less than 0.5% percent. This risk is low in comparison to the normal incidence of human cancer, which is 33% for women and 50% for men according to the American Cancer Society. - Radiation induced injury can include skin redness, resembling a rash, tissue breakdown / ulcers and hair loss (which can be temporary or permanent).   The likelihood of either of these occurring depends on the difficulty of the procedure and whether you are sensitive to radiation due to previous procedures, disease, or genetic conditions.   IF your procedure requires a prolonged use of radiation,  you will be notified and given written instructions for further action.  It is your responsibility to monitor the irradiated area for the 2 weeks following the procedure and to notify your physician if you are concerned that you have suffered a radiation induced injury.    All of the patient's questions were answered, patient is agreeable to proceed.  Consent signed and in chart.    Thank you for this interesting consult.  I greatly enjoyed meeting TSUGIO ELISON and look forward to participating in their care.  A copy of this  report was sent to the requesting provider on this date.  Electronically Signed: Darel Ebbs, PA-C 08/15/2023, 2:24 PM   I spent a total of    25 Minutes in face to face in clinical consultation, greater than 50% of which was counseling/coordinating care for T5 KP.   This chart was dictated using voice recognition software.  Despite best efforts to proofread,  errors can occur which can change the documentation meaning.

## 2023-08-15 ENCOUNTER — Other Ambulatory Visit: Payer: Self-pay | Admitting: Student

## 2023-08-15 ENCOUNTER — Other Ambulatory Visit: Payer: Self-pay

## 2023-08-15 ENCOUNTER — Encounter (HOSPITAL_COMMUNITY): Payer: Self-pay

## 2023-08-15 ENCOUNTER — Ambulatory Visit (HOSPITAL_COMMUNITY)
Admission: RE | Admit: 2023-08-15 | Discharge: 2023-08-15 | Disposition: A | Discharge status: Home / Self Care | Source: Ambulatory Visit | Attending: Interventional Radiology

## 2023-08-15 DIAGNOSIS — Z7902 Long term (current) use of antithrombotics/antiplatelets: Secondary | ICD-10-CM | POA: Insufficient documentation

## 2023-08-15 DIAGNOSIS — Z7984 Long term (current) use of oral hypoglycemic drugs: Secondary | ICD-10-CM | POA: Insufficient documentation

## 2023-08-15 DIAGNOSIS — Z7952 Long term (current) use of systemic steroids: Secondary | ICD-10-CM | POA: Insufficient documentation

## 2023-08-15 DIAGNOSIS — M069 Rheumatoid arthritis, unspecified: Secondary | ICD-10-CM | POA: Insufficient documentation

## 2023-08-15 DIAGNOSIS — Z9081 Acquired absence of spleen: Secondary | ICD-10-CM | POA: Insufficient documentation

## 2023-08-15 DIAGNOSIS — J449 Chronic obstructive pulmonary disease, unspecified: Secondary | ICD-10-CM | POA: Diagnosis not present

## 2023-08-15 DIAGNOSIS — Z79899 Other long term (current) drug therapy: Secondary | ICD-10-CM | POA: Diagnosis not present

## 2023-08-15 DIAGNOSIS — D72829 Elevated white blood cell count, unspecified: Secondary | ICD-10-CM | POA: Diagnosis not present

## 2023-08-15 DIAGNOSIS — I10 Essential (primary) hypertension: Secondary | ICD-10-CM | POA: Diagnosis not present

## 2023-08-15 DIAGNOSIS — S22050A Wedge compression fracture of T5-T6 vertebra, initial encounter for closed fracture: Secondary | ICD-10-CM | POA: Diagnosis not present

## 2023-08-15 DIAGNOSIS — F17210 Nicotine dependence, cigarettes, uncomplicated: Secondary | ICD-10-CM | POA: Insufficient documentation

## 2023-08-15 DIAGNOSIS — E119 Type 2 diabetes mellitus without complications: Secondary | ICD-10-CM | POA: Diagnosis not present

## 2023-08-15 DIAGNOSIS — Z7982 Long term (current) use of aspirin: Secondary | ICD-10-CM | POA: Diagnosis not present

## 2023-08-15 DIAGNOSIS — X58XXXA Exposure to other specified factors, initial encounter: Secondary | ICD-10-CM | POA: Diagnosis not present

## 2023-08-15 DIAGNOSIS — Z01818 Encounter for other preprocedural examination: Secondary | ICD-10-CM

## 2023-08-15 HISTORY — PX: IR KYPHO THORACIC WITH BONE BIOPSY: IMG5518

## 2023-08-15 LAB — CBC
HCT: 45.1 % (ref 39.0–52.0)
Hemoglobin: 14.7 g/dL (ref 13.0–17.0)
MCH: 32.1 pg (ref 26.0–34.0)
MCHC: 32.6 g/dL (ref 30.0–36.0)
MCV: 98.5 fL (ref 80.0–100.0)
Platelets: 378 10*3/uL (ref 150–400)
RBC: 4.58 MIL/uL (ref 4.22–5.81)
RDW: 15.2 % (ref 11.5–15.5)
WBC: 19.9 10*3/uL — ABNORMAL HIGH (ref 4.0–10.5)
nRBC: 0 % (ref 0.0–0.2)

## 2023-08-15 LAB — PROTIME-INR
INR: 1 (ref 0.8–1.2)
Prothrombin Time: 13.5 s (ref 11.4–15.2)

## 2023-08-15 MED ORDER — MIDAZOLAM HCL 2 MG/2ML IJ SOLN
INTRAMUSCULAR | Status: AC | PRN
  Administered 2023-08-15 (×4): .5 mg via INTRAVENOUS
  Administered 2023-08-15: 1 mg via INTRAVENOUS

## 2023-08-15 MED ORDER — LIDOCAINE HCL (PF) 1 % IJ SOLN
INTRAMUSCULAR | Status: AC
  Filled 2023-08-15: qty 30

## 2023-08-15 MED ORDER — LIDOCAINE HCL (PF) 1 % IJ SOLN
30.0000 mL | Freq: Once | INTRAMUSCULAR | Status: AC
  Administered 2023-08-15: 10 mL via INTRADERMAL

## 2023-08-15 MED ORDER — IOHEXOL 300 MG/ML  SOLN
50.0000 mL | Freq: Once | INTRAMUSCULAR | Status: AC | PRN
  Administered 2023-08-15: 40 mL

## 2023-08-15 MED ORDER — BUPIVACAINE HCL (PF) 0.25 % IJ SOLN
INTRAMUSCULAR | Status: AC
Start: 2023-08-15 — End: 2023-08-15
  Filled 2023-08-15: qty 30

## 2023-08-15 MED ORDER — FENTANYL CITRATE (PF) 100 MCG/2ML IJ SOLN
INTRAMUSCULAR | Status: AC
Start: 2023-08-15 — End: 2023-08-15
  Filled 2023-08-15: qty 2

## 2023-08-15 MED ORDER — CEFAZOLIN SODIUM-DEXTROSE 2-4 GM/100ML-% IV SOLN
INTRAVENOUS | Status: AC | PRN
  Administered 2023-08-15: 2 g via INTRAVENOUS

## 2023-08-15 MED ORDER — MIDAZOLAM HCL 2 MG/2ML IJ SOLN
INTRAMUSCULAR | Status: AC
  Filled 2023-08-15: qty 2

## 2023-08-15 MED ORDER — BUPIVACAINE HCL (PF) 0.25 % IJ SOLN
30.0000 mL | Freq: Once | INTRAMUSCULAR | Status: AC
  Administered 2023-08-15: 15 mL

## 2023-08-15 MED ORDER — FENTANYL CITRATE (PF) 100 MCG/2ML IJ SOLN
INTRAMUSCULAR | Status: AC | PRN
  Administered 2023-08-15 (×2): 25 ug via INTRAVENOUS
  Administered 2023-08-15: 50 ug via INTRAVENOUS
  Administered 2023-08-15 (×2): 25 ug via INTRAVENOUS

## 2023-08-15 MED ORDER — CEFAZOLIN SODIUM-DEXTROSE 2-4 GM/100ML-% IV SOLN
INTRAVENOUS | Status: AC
  Filled 2023-08-15: qty 100

## 2023-08-15 NOTE — Procedures (Signed)
 Interventional Radiology Procedure Note  Procedure:  T5 kyphoplasty  Findings: Please refer to procedural dictation for full description.  Complications: None immediate  Estimated Blood Loss: < 5 ml  Recommendations: 2 hour bedrest. IR will arrange 2-4 week clinic follow up.   Creasie Doctor, MD

## 2023-08-16 ENCOUNTER — Inpatient Hospital Stay: Admission: RE | Admit: 2023-08-16 | Source: Ambulatory Visit

## 2023-08-29 ENCOUNTER — Ambulatory Visit (INDEPENDENT_AMBULATORY_CARE_PROVIDER_SITE_OTHER)
Admission: RE | Admit: 2023-08-29 | Discharge: 2023-08-29 | Disposition: A | Discharge status: Home / Self Care | Source: Ambulatory Visit | Attending: Nurse Practitioner | Admitting: Nurse Practitioner

## 2023-08-29 DIAGNOSIS — F17210 Nicotine dependence, cigarettes, uncomplicated: Secondary | ICD-10-CM

## 2023-08-29 DIAGNOSIS — Z122 Encounter for screening for malignant neoplasm of respiratory organs: Secondary | ICD-10-CM | POA: Diagnosis not present

## 2023-08-29 DIAGNOSIS — Z87891 Personal history of nicotine dependence: Secondary | ICD-10-CM

## 2023-09-05 ENCOUNTER — Telehealth: Payer: Self-pay | Admitting: Acute Care

## 2023-09-05 ENCOUNTER — Telehealth: Payer: Self-pay

## 2023-09-05 NOTE — Telephone Encounter (Signed)
 Call report:  IMPRESSION: Lung-RADS 0, incomplete. Additional lung cancer screening CT images/or comparison to prior chest CT examinations is needed. Short-term interval development of numerous ill-defined irregular nodular opacities in the lower lungs, left greater than right with some of these nodular opacities measuring up to at least 17 mm. Imaging features are most suggestive of an infectious/inflammatory process with atypical etiology not excluded. Short-term interval follow-up lung cancer screening chest CT in 3 months after therapy recommended.   Aortic Atherosclerosis (ICD10-I70.0) and Emphysema (ICD10-J43.9).   These results will be called to the ordering clinician or representative by the Radiologist Assistant, and communication documented in the PACS or [employer].     Electronically Signed   By: Camellia Candle M.D.   On: 09/05/2023 10:48

## 2023-09-05 NOTE — Telephone Encounter (Unsigned)
 Copied from CRM [id number]. Topic: Clinical - Lab/Test Results >> Sep 05, 2023 11:14 AM Corean SAUNDERS wrote: Reason for CRM: Dianne from [facility] calling to update Lauraine Lites that the patients CT results from 6/25 are now available in the chart.

## 2023-09-05 NOTE — Telephone Encounter (Signed)
 Spoke with patient by phone.  Patient states he has had a 'lot of congestion' in his lungs 'for a while.' He has not felt fatigued, had fever or any other complaints.  His phlegm is 'white'.  He will have his PCP review his results next week. She is out of town this week.  Advised we will contact him to schedule the repeat CT scan so it will give a clearer picture of his lungs but we need to make sure his congestion is clearing before the repeat CT.  Patient will get advice from PCP and wait for us  to call back to schedule.

## 2023-09-06 NOTE — Telephone Encounter (Signed)
 See telephone note from 09/05/23

## 2023-09-08 DIAGNOSIS — J9601 Acute respiratory failure with hypoxia: Secondary | ICD-10-CM | POA: Diagnosis not present

## 2023-09-08 DIAGNOSIS — J441 Chronic obstructive pulmonary disease with (acute) exacerbation: Secondary | ICD-10-CM | POA: Diagnosis not present

## 2023-09-08 DIAGNOSIS — J449 Chronic obstructive pulmonary disease, unspecified: Secondary | ICD-10-CM | POA: Diagnosis not present

## 2023-09-12 DIAGNOSIS — M0579 Rheumatoid arthritis with rheumatoid factor of multiple sites without organ or systems involvement: Secondary | ICD-10-CM | POA: Diagnosis not present

## 2023-09-12 DIAGNOSIS — E782 Mixed hyperlipidemia: Secondary | ICD-10-CM | POA: Diagnosis not present

## 2023-09-12 DIAGNOSIS — J449 Chronic obstructive pulmonary disease, unspecified: Secondary | ICD-10-CM | POA: Diagnosis not present

## 2023-09-12 DIAGNOSIS — E1169 Type 2 diabetes mellitus with other specified complication: Secondary | ICD-10-CM | POA: Diagnosis not present

## 2023-09-12 DIAGNOSIS — E785 Hyperlipidemia, unspecified: Secondary | ICD-10-CM | POA: Diagnosis not present

## 2023-09-12 DIAGNOSIS — Z1211 Encounter for screening for malignant neoplasm of colon: Secondary | ICD-10-CM | POA: Diagnosis not present

## 2023-09-12 DIAGNOSIS — I10 Essential (primary) hypertension: Secondary | ICD-10-CM | POA: Diagnosis not present

## 2023-09-12 DIAGNOSIS — Z6834 Body mass index (BMI) 34.0-34.9, adult: Secondary | ICD-10-CM | POA: Diagnosis not present

## 2023-09-12 DIAGNOSIS — E538 Deficiency of other specified B group vitamins: Secondary | ICD-10-CM | POA: Diagnosis not present

## 2023-09-12 NOTE — Telephone Encounter (Signed)
 Spoke with patient and spouse. Advises he was seen by PCP today and was not prescribed antibiotics. Spouse was going to reach back out to Therisa Sieve, NP and ask if he could be started on antibiotics due to recent CT results and current symptoms. Unsure if this was discussed at appt. Pt to repeat scan in 3 months. Spouse request call back closer to due date. Results and plan to PCP. Order placed.

## 2023-09-12 NOTE — Telephone Encounter (Signed)
 LVM to call and update staff on PCP F/U and scheduling f/u CT.

## 2023-10-04 DIAGNOSIS — E1169 Type 2 diabetes mellitus with other specified complication: Secondary | ICD-10-CM | POA: Diagnosis not present

## 2023-10-04 DIAGNOSIS — J449 Chronic obstructive pulmonary disease, unspecified: Secondary | ICD-10-CM | POA: Diagnosis not present

## 2023-10-09 DIAGNOSIS — J9601 Acute respiratory failure with hypoxia: Secondary | ICD-10-CM | POA: Diagnosis not present

## 2023-10-09 DIAGNOSIS — J449 Chronic obstructive pulmonary disease, unspecified: Secondary | ICD-10-CM | POA: Diagnosis not present

## 2023-10-09 DIAGNOSIS — J441 Chronic obstructive pulmonary disease with (acute) exacerbation: Secondary | ICD-10-CM | POA: Diagnosis not present

## 2023-10-15 DIAGNOSIS — E782 Mixed hyperlipidemia: Secondary | ICD-10-CM | POA: Diagnosis not present

## 2023-10-15 DIAGNOSIS — I2510 Atherosclerotic heart disease of native coronary artery without angina pectoris: Secondary | ICD-10-CM | POA: Diagnosis not present

## 2023-10-15 DIAGNOSIS — I252 Old myocardial infarction: Secondary | ICD-10-CM | POA: Diagnosis not present

## 2023-10-15 DIAGNOSIS — Z955 Presence of coronary angioplasty implant and graft: Secondary | ICD-10-CM | POA: Diagnosis not present

## 2023-10-17 DIAGNOSIS — J449 Chronic obstructive pulmonary disease, unspecified: Secondary | ICD-10-CM | POA: Diagnosis not present

## 2023-10-23 ENCOUNTER — Encounter: Payer: Self-pay | Admitting: Primary Care

## 2023-10-23 ENCOUNTER — Ambulatory Visit (INDEPENDENT_AMBULATORY_CARE_PROVIDER_SITE_OTHER): Admitting: Primary Care

## 2023-10-23 VITALS — BP 124/62 | HR 79 | Temp 98.5°F | Ht 63.0 in | Wt 204.0 lb

## 2023-10-23 DIAGNOSIS — J9621 Acute and chronic respiratory failure with hypoxia: Secondary | ICD-10-CM

## 2023-10-23 DIAGNOSIS — J42 Unspecified chronic bronchitis: Secondary | ICD-10-CM

## 2023-10-23 DIAGNOSIS — J9612 Chronic respiratory failure with hypercapnia: Secondary | ICD-10-CM | POA: Diagnosis not present

## 2023-10-23 DIAGNOSIS — R918 Other nonspecific abnormal finding of lung field: Secondary | ICD-10-CM | POA: Diagnosis not present

## 2023-10-23 DIAGNOSIS — F17210 Nicotine dependence, cigarettes, uncomplicated: Secondary | ICD-10-CM | POA: Diagnosis not present

## 2023-10-23 DIAGNOSIS — R053 Chronic cough: Secondary | ICD-10-CM

## 2023-10-23 DIAGNOSIS — J9611 Chronic respiratory failure with hypoxia: Secondary | ICD-10-CM | POA: Diagnosis not present

## 2023-10-23 DIAGNOSIS — J449 Chronic obstructive pulmonary disease, unspecified: Secondary | ICD-10-CM | POA: Diagnosis not present

## 2023-10-23 MED ORDER — GUAIFENESIN ER 600 MG PO TB12: 600.0000 mg | ORAL_TABLET | Freq: Two times a day (BID) | ORAL | 0 refills | Status: DC | PRN

## 2023-10-23 MED ORDER — AMOXICILLIN-POT CLAVULANATE 875-125 MG PO TABS
1.0000 | ORAL_TABLET | Freq: Two times a day (BID) | ORAL | 0 refills | Status: DC
Start: 2023-10-23 — End: 2024-01-02

## 2023-10-23 NOTE — Patient Instructions (Addendum)
  VISIT SUMMARY: You came in today for your three-month follow-up regarding your COPD and respiratory issues. We discussed your chronic cough, chest congestion, and the results of your recent imaging studies.  YOUR PLAN: -CHRONIC OBSTRUCTIVE PULMONARY DISEASE (COPD) WITH CHRONIC RESPIRATORY FAILURE AND HYPOXIA: COPD is a chronic lung condition that makes it hard to breathe and can lead to respiratory failure and low oxygen  levels. You should continue using your Breztri  inhaler twice daily and your ipratropium albuterol  nebulizer daily as needed for shortness of breath. Use the budesonide  (pulmicort ) nebulizer twice daily for 7 days if you experience wheezing.  -CHRONIC COUGH WITH CHEST CONGESTION: Your persistent cough and chest congestion may be due to mucus buildup. Start taking Mucinex  (guaifenesin ) 1200 mg, 1-2 tablets twice daily for 30 days. We are also prescribing another course of antibiotics for 10 days. Use the Flutter valve regularly to help clear mucus. Please monitor your symptoms and let us  know if the congestion does not improve after finishing the antibiotics.  -PULMONARY NODULES UNDER SURVEILLANCE: Pulmonary nodules are small growths in the lungs that can be due to inflammation or infection. We will schedule a follow-up CT scan with the lung cancer screening program at the end of September to monitor these nodules.  INSTRUCTIONS: Please follow up with a CT scan at the end of September as part of the lung cancer screening program. Continue to monitor your symptoms and report any changes, especially if your congestion does not improve after the antibiotics.  Follow-up 4 months with Kara

## 2023-10-23 NOTE — Progress Notes (Signed)
 "  @Patient  ID: Nathan Nicholson, male    DOB: 10-22-1955, 68 y.o.   MRN: [id number]  No chief complaint on file.   Referring provider: Pandora Therisa RAMAN, NP  HPI: 68 year old male, former smoker.  Past medical history significant for COPD, hypertension, RA, type 2 diabetes, hyperlipidemia, obesity.  Patient of Dr. Kara.  Previous [facility] pulmonary encounter: 07/28/2021 Patient presents today for a follow-up visit to discuss oxygen .  She was seen by Dr. Kara in April for COPD exacerbation treated with Z-Pak and prednisone  taper she was also started on budesonide  nebulizer treatment twice daily and advised to continue DuoNebs every 6 hours.  He is chronically on 2l oxygen  at home. He came in today to qualify for POC. He was in the hospital for planned kypoplasy. Ended up with pneumonia. He was admitted [facility] 5/13-19 for pneumonia. Discharged on Doxycyline. He is feeling better. Still coughing but not getting up as much as he previously was. Sputum is clear. He is taking budesonide  twice and duoneb three times a day.    04/20/2023 Discussed the use of AI scribe software for clinical note transcription with the patient, who gave verbal consent to proceed.  History of Present Illness   Nathan Nicholson is a 68 year old male with COPD and respiratory failure who presents for a follow-up visit.  He has a history of COPD and respiratory failure, with previous pneumonia in 2019 following back surgery. A chest x-ray a year ago showed scarring at left lung base, but no pneumonia was present in a chest x-ray from May 2023.  He experiences shortness of breath, particularly when walking, and uses two liters of oxygen  continuously. His breathing worsens at night, causing significant effort to breathe. He has a chronic cough with yellowish mucus. No chest tightness or wheezing.  His current medication regimen includes a nebulizer with budesonide  (Pulmicort ) twice daily and ipratropium/albuterol  (Duoneb)  every eight hours. He also carries an inhaler, possibly Ventolin , for use as needed. He notes inconsistent adherence to this regimen.  He has not had a sleep study for sleep apnea and reports poor sleep, getting only a couple of hours at a time. He has not participated in pulmonary rehabilitation but is open to it, noting difficulty walking long distances due to shortness of breath.   1. COPD with exacerbation (HCC) (Primary) - AMB REFERRAL FOR DME    Chronic Obstructive Pulmonary Disease (COPD) with acute bronchitis  Patient reports shortness of breath with exertion, particularly at night, and is currently using nebulizers inconsistently. He has a cough with yellow mucus and difficulty expectorating mucus. Auscultation revealed congestion and wheezing.  -Transition from nebulizers to Breztri  inhaler, to be used morning and evening. -Continue use of nebulizers as a backup for acute symptom exacerbation. -Start prednisone  due to presence of wheezing -Start Mucinex  to thin mucus and facilitate expectoration. -Start azithromycin  (Z-Pak) to treat bronchitis and prevent progression to pneumonia. -Refer to pulmonary rehab for further management of symptoms.   Follow-up Schedule appointment with Dr. Kara in 2-3 months, or sooner if symptoms worsen.    07/20/2023  Discussed the use of AI scribe software for clinical note transcription with the patient, who gave verbal consent to proceed.  History of Present Illness   Nathan Nicholson is a 68 year old male with COPD and CHF who presents for follow-up of respiratory symptoms.  He was last seen in February during a COPD flare-up and was prescribed a Z-Pak. His medication regimen was adjusted  from nebulizers to Breztri , and he was instructed to stop Pulmicort . He uses Breztri  twice daily, two puffs in the morning and two at night, and uses ipratropium albuterol  (DuoNeb) for breakthrough symptoms, occasionally using Ventolin  or albuterol  when  out.  He reports improvement in nighttime breathing since starting Breztri , taking it at 10 PM and again at 6 AM. He uses oxygen  at two liters and has a chronic cough with phlegm production. He finds it difficult to cough hard due to back pain. He is scheduled for back surgery and has had three previous back surgeries without anesthesia issues.  He is on Plavix  (clopidogrel ) as a blood thinner. He has not had a sleep study and does not feel he needs one. He uses a flutter valve to help with congestion and has been using Mucinex , which he describes as a blue and white pill, twice a day to thin mucus. He smokes two to three cigarettes a day.  He had an MRI of the spine recently, with results pending, and is awaiting back surgery. He has not had a recent chest x-ray or CT scan for lung cancer screening, despite being a current smoker. He wants to proceed with back surgery due to discomfort from sleeping in a chair.      1. Cigarette smoker (Primary) - Ambulatory Referral for Lung Cancer Scre  2. Chronic bronchitis, unspecified chronic bronchitis type (HCC) - DG Chest 2 View; Future  Assessment and Plan    Chronic Obstructive Pulmonary Disease (COPD) Last AECOPD in February treated with azithromycin  and transition from nebulizers to Breztri . Symptoms are well-managed with Breztri , used twice daily. He has a chronic cough and persistent wheezing which is not new. He uses supplemental oxygen  at 2 liters and reports improved nighttime symptoms with Breztri . Discussed the use of ipratropium-albuterol  for breakthrough symptoms and budesonide  for emergency wheezing. - Continue Breztri , two puffs in the morning and two at night. - Use ipratropium-albuterol  (DuoNeb) q6 hours for breakthrough symptoms. - Use budesonide  nebulizer BID as needed for wheezing, not daily. - Continue Mucinex  twice a day to thin mucus. - Continue using the flutter valve to help loosen congestion. - Order chest x-ray for  preoperative evaluation. - Patient will be considered intermediate risk for prolonged mechanical ventilation and/or post op pulmonary complications due to COPD and chronic respiratory failure, recommend surgery be done in hospital settings. Date to be determined.   Chronic Cough Chronic cough with sputum production, limited by back pain. Mucinex  recommended to thin mucus and facilitate expectoration. - Continue Mucinex  600mg  twice a day to thin mucus. - Use flutter valve to help loosen congestion.  Respiratory Failure Respiratory failure contributes to intermediate surgical risk. Emphasized the importance of postoperative activity to prevent respiratory complications. Discussed anesthesia risks and the need for a hospital setting for surgery. - Encourage postoperative activity, including sitting up for meals and walking to prevent complications.  Nicotine  Dependence Current smoker with a history of smoking. Smoking cessation is advised to reduce surgical risks and improve healing. He smokes two to three cigarettes a day, sometimes none. Discussed the impact of smoking on surgical outcomes and healing. - Advise smoking cessation prior to surgery to reduce surgical risks and improve outcomes. - Referring to lung cancer screening program.    10/23/2023- Interim hx   Discussed the use of AI scribe software for clinical note transcription with the patient, who gave verbal consent to proceed.  History of Present Illness RYAN OGBORN is a 68 year old male  with COPD and respiratory failure who presents for a three-month follow-up.  He has a chronic cough and chest congestion, which he describes as persistent and bothersome. He attempts to expel clear mucus, which is not discolored. He was treated with an antibiotic in July for five days, which provided some relief, but symptoms returned shortly after completing the course.  He is currently on Breztri , Duoneb, and budesonide  nebulizer as needed  for wheezing, and uses a flutter valve. He has not been using Mucinex  recently. He uses his maintenance inhaler, Breztri , twice in the morning and twice at night. He also uses ipratropium albuterol  daily due to heat intolerance.  A chest x-ray in May showed chronic bronchial thickening and unchanged scar. A Low-dose CT scan in June revealed nodular opacities in the lower lungs, left greater than right, with some nodules measuring 17 mm.  He is on supplemental oxygen  at two liters with no change in requirements. His medical supplies are provided by [employer].  No color or blood in the mucus, no change in oxygen  requirements, and no fever.   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 Karolynn by hematologist Dr. Cloretta   Myocardial infarction [facility])    Oxygen  dependent    2L via [place]   Pneumonia    Rheumatoid arthritis (HCC)     Tobacco History: Social History   Tobacco Use  Smoking Status Every Day   Current packs/day: 0.00   Average packs/day: 0.5 packs/day for 40.0 years (20.0 ttl pk-yrs)   Types: Cigarettes   Start date: 06/28/1981   Last attempt to quit: 06/28/2021   Years since quitting: 2.3  Smokeless Tobacco Never  Tobacco Comments   Pt smokes 3-4 cigs a day. AB, CMA 07-20-23   Ready to quit: Not Answered Counseling given: Not Answered Tobacco comments: Pt smokes 3-4 cigs a day. AB, CMA 07-20-23   Outpatient Medications Prior to Visit  Medication Sig Dispense Refill   acetaminophen  (TYLENOL ) 500 MG tablet Take 1,000 mg by mouth every 6 (six) hours  as needed for moderate pain.     albuterol  (VENTOLIN  HFA) 108 (90 Base) MCG/ACT inhaler Inhale 2 puffs into the lungs every 4 (four) hours as needed (Breathing).     alendronate (FOSAMAX) 70 MG tablet Take 70 mg by mouth every Friday. Take with a full glass of water  on an empty stomach.     aspirin  81 MG EC tablet Take 81 mg by mouth every morning.     atorvastatin  (LIPITOR) 10 MG tablet Take 10 mg by mouth every morning.     azithromycin  (ZITHROMAX ) 250 MG tablet Per Zpack instructions 6 tablet 0   Budeson-Glycopyrrol-Formoterol  (BREZTRI  AEROSPHERE) 160-9-4.8 MCG/ACT AERO Inhale 2 puffs into the lungs in the morning and at bedtime.     Calcium  Carb-Cholecalciferol (CALCIUM  600+D3 PO) Take 1 tablet by mouth 2 (two) times daily.     clopidogrel  (PLAVIX ) 75 MG tablet Take 75 mg by mouth daily.     cyanocobalamin  (,VITAMIN B-12,) 1000 MCG/ML injection Inject 1,000 mcg into the muscle every  30 (thirty) days.     empagliflozin (JARDIANCE) 10 MG TABS tablet Take 10 mg by mouth daily.     escitalopram (LEXAPRO) 10 MG tablet Take 10 mg by mouth at bedtime.     ferrous sulfate  325 (65 FE) MG tablet Take 325 mg by mouth daily.     folic acid  (FOLVITE ) 1 MG tablet Take 1 mg by mouth every morning.     guaiFENesin  (MUCINEX ) 600 MG 12 hr tablet Take 1 tablet (600 mg total) by mouth 2 (two) times daily as needed for cough or to loosen phlegm. 30 tablet 0   ipratropium-albuterol  (DUONEB) 0.5-2.5 (3) MG/3ML SOLN Take 3 mLs by nebulization every 6 (six) hours as needed.     lisinopril -hydrochlorothiazide  (ZESTORETIC ) 20-12.5 MG tablet Take 1 tablet by mouth daily. PT TO TAKE ONLY 1/2 TABLET AT HS (PER HIS REPORT)     meloxicam (MOBIC) 15 MG tablet Take 15 mg by mouth daily.     methotrexate (RHEUMATREX) 2.5 MG tablet Take 10 mg by mouth every Tuesday. Caution:Chemotherapy. Protect from light.     nebivolol  (BYSTOLIC ) 10 MG tablet Take 10 mg by mouth daily.     nitroGLYCERIN  (NITROSTAT ) 0.4 MG SL tablet Place 0.4  mg under the tongue every 5 (five) minutes as needed for chest pain. Up to 3 doses.     oxyCODONE -acetaminophen  (PERCOCET) 10-325 MG tablet Take 1 tablet by mouth every 8 (eight) hours as needed for pain. For only 10 days     OXYGEN  Inhale 2 L into the lungs continuous.     predniSONE  (DELTASONE ) 20 MG tablet Take 20 mg by mouth in the morning and at bedtime.     senna-docusate (SENOKOT-S) 8.6-50 MG tablet Take 1 tablet by mouth 2 (two) times daily as needed for moderate constipation. 20 tablet 0   Skin Protectants, Misc. (EUCERIN) cream Apply 1 application. topically daily.     Vitamin D, Ergocalciferol, (DRISDOL) 1.25 MG (50000 UNIT) CAPS capsule Take 50,000 Units by mouth every Monday.     No facility-administered medications prior to visit.    Review of Systems  Review of Systems  Constitutional: Negative.   HENT:  Positive for congestion.   Respiratory:  Positive for cough.      Physical Exam  There were no vitals taken for this visit. Physical Exam Constitutional:      Appearance: Normal appearance.  HENT:     Head: Normocephalic and atraumatic.  Cardiovascular:     Rate and Rhythm: Normal rate and regular rhythm.  Pulmonary:     Breath sounds: Wheezing and rhonchi present.     Comments: 2L Musculoskeletal:        General: Normal range of motion.  Skin:    General: Skin is warm and dry.  Neurological:     General: No focal deficit present.     Mental Status: He is alert and oriented to person, place, and time. Mental status is at baseline.  Psychiatric:        Mood and Affect: Mood normal.        Behavior: Behavior normal.        Thought Content: Thought content normal.        Judgment: Judgment normal.      Lab Results:  CBC    Component Value Date/Time   WBC 19.9 (H) 08/15/2023 1325   RBC 4.58 08/15/2023 1325   HGB 14.7 08/15/2023 1325   HGB 15.3 05/04/2021 1100   HGB 16.3 03/11/2013 0847  HCT 45.1 08/15/2023 1325   HCT 48.3 03/11/2013 0847   PLT  378 08/15/2023 1325   PLT 428 (H) 05/04/2021 1100   PLT 372 Large & giant platelets 03/11/2013 0847   MCV 98.5 08/15/2023 1325   MCV 90.2 03/11/2013 0847   MCH 32.1 08/15/2023 1325   MCHC 32.6 08/15/2023 1325   RDW 15.2 08/15/2023 1325   RDW 14.7 (H) 03/11/2013 0847   LYMPHSABS 4.9 (H) 10/05/2021 0653   LYMPHSABS 3.6 (H) 03/11/2013 0847   MONOABS 1.1 (H) 10/05/2021 0653   MONOABS 2.1 (H) 03/11/2013 0847   EOSABS 0.9 (H) 10/05/2021 0653   EOSABS 0.9 (H) 03/11/2013 0847   BASOSABS 0.2 (H) 10/05/2021 0653   BASOSABS 0.2 (H) 03/11/2013 0847    BMET    Component Value Date/Time   NA 138 10/05/2021 0653   K 3.4 (L) 10/05/2021 0653   CL 101 10/05/2021 0653   CO2 29 10/05/2021 0653   GLUCOSE 98 10/05/2021 0653   BUN 7 (L) 10/05/2021 0653   CREATININE 0.84 10/05/2021 0653   CREATININE 0.94 05/04/2021 1100   CALCIUM  9.0 10/05/2021 0653   GFRNONAA >60 10/05/2021 0653   GFRNONAA >60 05/04/2021 1100    BNP No results found for: BNP  ProBNP No results found for: PROBNP  Imaging: No results found.   Assessment & Plan:   1. Chronic bronchitis, unspecified chronic bronchitis type (HCC) (Primary)  2. Chronic respiratory failure with hypoxia (HCC)  3. Acute on chronic respiratory failure with hypoxia and hypercapnia (HCC)   Assessment and Plan Assessment & Plan Chronic obstructive pulmonary disease (COPD) with chronic respiratory failure and hypoxia COPD with chronic respiratory failure and hypoxia is managed on the current treatment regimen. Oxygen  requirements remain unchanged at 2 liters via portable concentrator. Maintenance inhaler Breztri  is used twice daily. Ipratropium albuterol  nebulizer is used daily for dyspnea. Budesonide  nebulizer is reserved for wheezing episodes. Prednisone  is avoided due to potential osteoporosis risk and existing back issues. - Continue Breztri  inhaler twice daily. - Continue ipratropium albuterol  nebulizer daily as needed for dyspnea. -  Use budesonide  nebulizer twice daily for 7 days if wheezing occurs.  Chronic cough with chest congestion Persistent chest congestion with chronic cough. Previous antibiotic course in July was partially effective but symptoms returned. Mucinex  is not currently used.  - Initiate Mucinex  (guaifenesin ) 1200 mg, 1-2 tablets twice daily  - Prescribe Augmentin  1 tab twice daily for 10 days. - Use Flutter valve regularly to aid in mucus clearance. - Monitor symptoms and report if congestion does not improve after antibiotics.  Pulmonary nodules under surveillance Pulmonary nodules identified on low dose CT scan in June, with nodular opacities in lower lungs, left greater than right, measuring up to 17 mm. Considered indicative of inflammatory or infectious process. Patient has chronic cough with congestion and rhonchi one exam.  - Rx Augmentin  BID x 10 days for acute on chronic bronchitis/AECOPD  - Schedule follow-up CT scan with lung cancer screening program at the end of September.    Almarie LELON Ferrari, NP 10/23/2023  "

## 2023-11-01 DIAGNOSIS — M8080XD Other osteoporosis with current pathological fracture, unspecified site, subsequent encounter for fracture with routine healing: Secondary | ICD-10-CM | POA: Diagnosis not present

## 2023-11-01 DIAGNOSIS — Z6837 Body mass index (BMI) 37.0-37.9, adult: Secondary | ICD-10-CM | POA: Diagnosis not present

## 2023-11-01 DIAGNOSIS — M1991 Primary osteoarthritis, unspecified site: Secondary | ICD-10-CM | POA: Diagnosis not present

## 2023-11-01 DIAGNOSIS — M545 Low back pain, unspecified: Secondary | ICD-10-CM | POA: Diagnosis not present

## 2023-11-01 DIAGNOSIS — E669 Obesity, unspecified: Secondary | ICD-10-CM | POA: Diagnosis not present

## 2023-11-01 DIAGNOSIS — M25561 Pain in right knee: Secondary | ICD-10-CM | POA: Diagnosis not present

## 2023-11-01 DIAGNOSIS — M0579 Rheumatoid arthritis with rheumatoid factor of multiple sites without organ or systems involvement: Secondary | ICD-10-CM | POA: Diagnosis not present

## 2023-11-04 DIAGNOSIS — E1169 Type 2 diabetes mellitus with other specified complication: Secondary | ICD-10-CM | POA: Diagnosis not present

## 2023-11-04 DIAGNOSIS — J449 Chronic obstructive pulmonary disease, unspecified: Secondary | ICD-10-CM | POA: Diagnosis not present

## 2023-11-09 DIAGNOSIS — J9601 Acute respiratory failure with hypoxia: Secondary | ICD-10-CM | POA: Diagnosis not present

## 2023-11-09 DIAGNOSIS — M5451 Vertebrogenic low back pain: Secondary | ICD-10-CM | POA: Diagnosis not present

## 2023-11-09 DIAGNOSIS — J441 Chronic obstructive pulmonary disease with (acute) exacerbation: Secondary | ICD-10-CM | POA: Diagnosis not present

## 2023-11-09 DIAGNOSIS — M791 Myalgia, unspecified site: Secondary | ICD-10-CM | POA: Diagnosis not present

## 2023-11-09 DIAGNOSIS — J449 Chronic obstructive pulmonary disease, unspecified: Secondary | ICD-10-CM | POA: Diagnosis not present

## 2023-11-12 ENCOUNTER — Other Ambulatory Visit: Payer: Self-pay

## 2023-11-12 MED ORDER — BREZTRI AEROSPHERE 160-9-4.8 MCG/ACT IN AERO: 2.0000 | INHALATION_SPRAY | Freq: Two times a day (BID) | RESPIRATORY_TRACT | 5 refills | Status: DC

## 2023-11-12 NOTE — Progress Notes (Signed)
 [place] paper received requesting a RX be sent in for pt.

## 2023-11-19 ENCOUNTER — Telehealth: Payer: Self-pay

## 2023-11-19 NOTE — Telephone Encounter (Signed)
 Attempted to schedule 3 month follow up scan, no answer, LVM.

## 2023-11-20 ENCOUNTER — Other Ambulatory Visit: Payer: Self-pay

## 2023-11-20 DIAGNOSIS — Z87891 Personal history of nicotine dependence: Secondary | ICD-10-CM

## 2023-11-20 DIAGNOSIS — R911 Solitary pulmonary nodule: Secondary | ICD-10-CM

## 2023-11-20 DIAGNOSIS — Z122 Encounter for screening for malignant neoplasm of respiratory organs: Secondary | ICD-10-CM

## 2023-11-27 ENCOUNTER — Ambulatory Visit (HOSPITAL_BASED_OUTPATIENT_CLINIC_OR_DEPARTMENT_OTHER)
Admission: RE | Admit: 2023-11-27 | Discharge: 2023-11-27 | Disposition: A | Discharge status: Home / Self Care | Source: Ambulatory Visit | Attending: Family Medicine | Admitting: Radiology

## 2023-11-27 DIAGNOSIS — Z87891 Personal history of nicotine dependence: Secondary | ICD-10-CM | POA: Diagnosis not present

## 2023-11-27 DIAGNOSIS — R911 Solitary pulmonary nodule: Secondary | ICD-10-CM

## 2023-11-27 DIAGNOSIS — Z122 Encounter for screening for malignant neoplasm of respiratory organs: Secondary | ICD-10-CM

## 2023-11-27 DIAGNOSIS — I2510 Atherosclerotic heart disease of native coronary artery without angina pectoris: Secondary | ICD-10-CM | POA: Diagnosis not present

## 2023-11-27 DIAGNOSIS — J432 Centrilobular emphysema: Secondary | ICD-10-CM | POA: Diagnosis not present

## 2023-12-04 DIAGNOSIS — J449 Chronic obstructive pulmonary disease, unspecified: Secondary | ICD-10-CM | POA: Diagnosis not present

## 2023-12-04 DIAGNOSIS — E1169 Type 2 diabetes mellitus with other specified complication: Secondary | ICD-10-CM | POA: Diagnosis not present

## 2023-12-05 ENCOUNTER — Other Ambulatory Visit: Payer: Self-pay | Admitting: Acute Care

## 2023-12-05 DIAGNOSIS — F17210 Nicotine dependence, cigarettes, uncomplicated: Secondary | ICD-10-CM

## 2023-12-05 DIAGNOSIS — Z122 Encounter for screening for malignant neoplasm of respiratory organs: Secondary | ICD-10-CM

## 2023-12-05 DIAGNOSIS — Z87891 Personal history of nicotine dependence: Secondary | ICD-10-CM

## 2023-12-09 DIAGNOSIS — J449 Chronic obstructive pulmonary disease, unspecified: Secondary | ICD-10-CM | POA: Diagnosis not present

## 2023-12-09 DIAGNOSIS — J441 Chronic obstructive pulmonary disease with (acute) exacerbation: Secondary | ICD-10-CM | POA: Diagnosis not present

## 2023-12-09 DIAGNOSIS — J9601 Acute respiratory failure with hypoxia: Secondary | ICD-10-CM | POA: Diagnosis not present

## 2023-12-17 DIAGNOSIS — M0579 Rheumatoid arthritis with rheumatoid factor of multiple sites without organ or systems involvement: Secondary | ICD-10-CM | POA: Diagnosis not present

## 2023-12-17 DIAGNOSIS — Z23 Encounter for immunization: Secondary | ICD-10-CM | POA: Diagnosis not present

## 2023-12-17 DIAGNOSIS — E1169 Type 2 diabetes mellitus with other specified complication: Secondary | ICD-10-CM | POA: Diagnosis not present

## 2023-12-17 DIAGNOSIS — E785 Hyperlipidemia, unspecified: Secondary | ICD-10-CM | POA: Diagnosis not present

## 2023-12-17 DIAGNOSIS — I10 Essential (primary) hypertension: Secondary | ICD-10-CM | POA: Diagnosis not present

## 2023-12-17 DIAGNOSIS — Z6836 Body mass index (BMI) 36.0-36.9, adult: Secondary | ICD-10-CM | POA: Diagnosis not present

## 2023-12-17 DIAGNOSIS — E782 Mixed hyperlipidemia: Secondary | ICD-10-CM | POA: Diagnosis not present

## 2023-12-17 DIAGNOSIS — E559 Vitamin D deficiency, unspecified: Secondary | ICD-10-CM | POA: Diagnosis not present

## 2023-12-17 DIAGNOSIS — E538 Deficiency of other specified B group vitamins: Secondary | ICD-10-CM | POA: Diagnosis not present

## 2023-12-17 DIAGNOSIS — H6191 Disorder of right external ear, unspecified: Secondary | ICD-10-CM | POA: Diagnosis not present

## 2023-12-30 ENCOUNTER — Other Ambulatory Visit: Payer: Self-pay

## 2023-12-30 ENCOUNTER — Emergency Department (HOSPITAL_COMMUNITY)

## 2023-12-30 ENCOUNTER — Inpatient Hospital Stay (HOSPITAL_COMMUNITY)
Admission: EM | Admit: 2023-12-30 | Discharge: 2024-01-04 | DRG: 193 | Disposition: A | Discharge status: Home Health | Attending: Internal Medicine | Admitting: Internal Medicine

## 2023-12-30 ENCOUNTER — Encounter (HOSPITAL_COMMUNITY): Payer: Self-pay

## 2023-12-30 DIAGNOSIS — Z91199 Patient's noncompliance with other medical treatment and regimen due to unspecified reason: Secondary | ICD-10-CM

## 2023-12-30 DIAGNOSIS — I5043 Acute on chronic combined systolic (congestive) and diastolic (congestive) heart failure: Secondary | ICD-10-CM | POA: Diagnosis present

## 2023-12-30 DIAGNOSIS — G9340 Encephalopathy, unspecified: Secondary | ICD-10-CM

## 2023-12-30 DIAGNOSIS — J449 Chronic obstructive pulmonary disease, unspecified: Secondary | ICD-10-CM | POA: Diagnosis present

## 2023-12-30 DIAGNOSIS — I10 Essential (primary) hypertension: Secondary | ICD-10-CM | POA: Diagnosis present

## 2023-12-30 DIAGNOSIS — Z7951 Long term (current) use of inhaled steroids: Secondary | ICD-10-CM

## 2023-12-30 DIAGNOSIS — G9341 Metabolic encephalopathy: Secondary | ICD-10-CM | POA: Diagnosis present

## 2023-12-30 DIAGNOSIS — J9601 Acute respiratory failure with hypoxia: Secondary | ICD-10-CM | POA: Diagnosis not present

## 2023-12-30 DIAGNOSIS — R918 Other nonspecific abnormal finding of lung field: Secondary | ICD-10-CM | POA: Diagnosis not present

## 2023-12-30 DIAGNOSIS — Z96641 Presence of right artificial hip joint: Secondary | ICD-10-CM | POA: Diagnosis present

## 2023-12-30 DIAGNOSIS — E785 Hyperlipidemia, unspecified: Secondary | ICD-10-CM | POA: Diagnosis not present

## 2023-12-30 DIAGNOSIS — D72820 Lymphocytosis (symptomatic): Secondary | ICD-10-CM | POA: Diagnosis present

## 2023-12-30 DIAGNOSIS — G4733 Obstructive sleep apnea (adult) (pediatric): Secondary | ICD-10-CM | POA: Diagnosis present

## 2023-12-30 DIAGNOSIS — Z9081 Acquired absence of spleen: Secondary | ICD-10-CM

## 2023-12-30 DIAGNOSIS — Z7984 Long term (current) use of oral hypoglycemic drugs: Secondary | ICD-10-CM

## 2023-12-30 DIAGNOSIS — D649 Anemia, unspecified: Secondary | ICD-10-CM | POA: Diagnosis present

## 2023-12-30 DIAGNOSIS — D7589 Other specified diseases of blood and blood-forming organs: Secondary | ICD-10-CM | POA: Diagnosis present

## 2023-12-30 DIAGNOSIS — Z791 Long term (current) use of non-steroidal anti-inflammatories (NSAID): Secondary | ICD-10-CM

## 2023-12-30 DIAGNOSIS — R4781 Slurred speech: Principal | ICD-10-CM

## 2023-12-30 DIAGNOSIS — R001 Bradycardia, unspecified: Secondary | ICD-10-CM | POA: Diagnosis present

## 2023-12-30 DIAGNOSIS — J189 Pneumonia, unspecified organism: Secondary | ICD-10-CM | POA: Diagnosis not present

## 2023-12-30 DIAGNOSIS — Z9981 Dependence on supplemental oxygen: Secondary | ICD-10-CM

## 2023-12-30 DIAGNOSIS — J9621 Acute and chronic respiratory failure with hypoxia: Secondary | ICD-10-CM | POA: Diagnosis present

## 2023-12-30 DIAGNOSIS — R278 Other lack of coordination: Secondary | ICD-10-CM | POA: Diagnosis present

## 2023-12-30 DIAGNOSIS — J9602 Acute respiratory failure with hypercapnia: Secondary | ICD-10-CM

## 2023-12-30 DIAGNOSIS — Z7983 Long term (current) use of bisphosphonates: Secondary | ICD-10-CM

## 2023-12-30 DIAGNOSIS — Z955 Presence of coronary angioplasty implant and graft: Secondary | ICD-10-CM

## 2023-12-30 DIAGNOSIS — F17210 Nicotine dependence, cigarettes, uncomplicated: Secondary | ICD-10-CM | POA: Diagnosis present

## 2023-12-30 DIAGNOSIS — J441 Chronic obstructive pulmonary disease with (acute) exacerbation: Secondary | ICD-10-CM | POA: Diagnosis not present

## 2023-12-30 DIAGNOSIS — Z79899 Other long term (current) drug therapy: Secondary | ICD-10-CM

## 2023-12-30 DIAGNOSIS — Z6836 Body mass index (BMI) 36.0-36.9, adult: Secondary | ICD-10-CM

## 2023-12-30 DIAGNOSIS — I214 Non-ST elevation (NSTEMI) myocardial infarction: Secondary | ICD-10-CM | POA: Diagnosis present

## 2023-12-30 DIAGNOSIS — Z7982 Long term (current) use of aspirin: Secondary | ICD-10-CM

## 2023-12-30 DIAGNOSIS — Z87891 Personal history of nicotine dependence: Secondary | ICD-10-CM | POA: Diagnosis not present

## 2023-12-30 DIAGNOSIS — Z79631 Long term (current) use of antimetabolite agent: Secondary | ICD-10-CM

## 2023-12-30 DIAGNOSIS — J168 Pneumonia due to other specified infectious organisms: Secondary | ICD-10-CM | POA: Diagnosis not present

## 2023-12-30 DIAGNOSIS — M0500 Felty's syndrome, unspecified site: Secondary | ICD-10-CM | POA: Diagnosis present

## 2023-12-30 DIAGNOSIS — I2510 Atherosclerotic heart disease of native coronary artery without angina pectoris: Secondary | ICD-10-CM | POA: Diagnosis present

## 2023-12-30 DIAGNOSIS — R29818 Other symptoms and signs involving the nervous system: Secondary | ICD-10-CM | POA: Diagnosis not present

## 2023-12-30 DIAGNOSIS — R471 Dysarthria and anarthria: Secondary | ICD-10-CM | POA: Diagnosis not present

## 2023-12-30 DIAGNOSIS — Z7401 Bed confinement status: Secondary | ICD-10-CM

## 2023-12-30 DIAGNOSIS — Z8249 Family history of ischemic heart disease and other diseases of the circulatory system: Secondary | ICD-10-CM

## 2023-12-30 DIAGNOSIS — Z7902 Long term (current) use of antithrombotics/antiplatelets: Secondary | ICD-10-CM

## 2023-12-30 DIAGNOSIS — I252 Old myocardial infarction: Secondary | ICD-10-CM

## 2023-12-30 DIAGNOSIS — I110 Hypertensive heart disease with heart failure: Secondary | ICD-10-CM | POA: Diagnosis present

## 2023-12-30 DIAGNOSIS — R4182 Altered mental status, unspecified: Secondary | ICD-10-CM | POA: Diagnosis not present

## 2023-12-30 DIAGNOSIS — R4701 Aphasia: Secondary | ICD-10-CM | POA: Diagnosis not present

## 2023-12-30 DIAGNOSIS — E66812 Obesity, class 2: Secondary | ICD-10-CM | POA: Diagnosis present

## 2023-12-30 DIAGNOSIS — R410 Disorientation, unspecified: Secondary | ICD-10-CM

## 2023-12-30 DIAGNOSIS — E119 Type 2 diabetes mellitus without complications: Secondary | ICD-10-CM | POA: Diagnosis present

## 2023-12-30 DIAGNOSIS — I700 Atherosclerosis of aorta: Secondary | ICD-10-CM | POA: Diagnosis present

## 2023-12-30 DIAGNOSIS — J9622 Acute and chronic respiratory failure with hypercapnia: Secondary | ICD-10-CM | POA: Diagnosis present

## 2023-12-30 DIAGNOSIS — J440 Chronic obstructive pulmonary disease with acute lower respiratory infection: Secondary | ICD-10-CM | POA: Diagnosis present

## 2023-12-30 LAB — CBC
HCT: 51.1 % (ref 39.0–52.0)
Hemoglobin: 15.4 g/dL (ref 13.0–17.0)
MCH: 32.2 pg (ref 26.0–34.0)
MCHC: 30.1 g/dL (ref 30.0–36.0)
MCV: 106.7 fL — ABNORMAL HIGH (ref 80.0–100.0)
Platelets: ADEQUATE K/uL (ref 150–400)
RBC: 4.79 MIL/uL (ref 4.22–5.81)
RDW: 16.7 % — ABNORMAL HIGH (ref 11.5–15.5)
WBC: 19.9 K/uL — ABNORMAL HIGH (ref 4.0–10.5)
nRBC: 0.2 % (ref 0.0–0.2)

## 2023-12-30 LAB — COMPREHENSIVE METABOLIC PANEL WITH GFR
ALT: 30 U/L (ref 0–44)
AST: 29 U/L (ref 15–41)
Albumin: 2.8 g/dL — ABNORMAL LOW (ref 3.5–5.0)
Alkaline Phosphatase: 129 U/L — ABNORMAL HIGH (ref 38–126)
Anion gap: 12 (ref 5–15)
BUN: 13 mg/dL (ref 8–23)
CO2: 34 mmol/L — ABNORMAL HIGH (ref 22–32)
Calcium: 9.1 mg/dL (ref 8.9–10.3)
Chloride: 96 mmol/L — ABNORMAL LOW (ref 98–111)
Creatinine, Ser: 0.98 mg/dL (ref 0.61–1.24)
GFR, Estimated: >60 mL/min (ref >60)
Glucose, Bld: 106 mg/dL — ABNORMAL HIGH (ref 70–99)
Potassium: 4.8 mmol/L (ref 3.5–5.1)
Sodium: 142 mmol/L (ref 135–145)
Total Bilirubin: 0.7 mg/dL (ref 0.0–1.2)
Total Protein: 7.1 g/dL (ref 6.5–8.1)

## 2023-12-30 LAB — DIFFERENTIAL
Abs Immature Granulocytes: 0.43 K/uL — ABNORMAL HIGH (ref 0.00–0.07)
Basophils Absolute: 0.1 K/uL (ref 0.0–0.1)
Basophils Relative: 1 %
Eosinophils Absolute: 0.1 K/uL (ref 0.0–0.5)
Eosinophils Relative: 1 %
Immature Granulocytes: 2 %
Lymphocytes Relative: 16 %
Lymphs Abs: 3.2 K/uL (ref 0.7–4.0)
Monocytes Absolute: 1.9 K/uL — ABNORMAL HIGH (ref 0.1–1.0)
Monocytes Relative: 10 %
Neutro Abs: 14.1 K/uL — ABNORMAL HIGH (ref 1.7–7.7)
Neutrophils Relative %: 70 %
Smear Review: NORMAL

## 2023-12-30 LAB — CBG MONITORING, ED: Glucose-Capillary: 105 mg/dL — ABNORMAL HIGH (ref 70–99)

## 2023-12-30 LAB — URINALYSIS, ROUTINE W REFLEX MICROSCOPIC
Bilirubin Urine: NEGATIVE
Glucose, UA: >=500 mg/dL — AB
Hgb urine dipstick: NEGATIVE
Ketones, ur: NEGATIVE mg/dL
Leukocytes,Ua: NEGATIVE
Nitrite: POSITIVE — AB
Protein, ur: NEGATIVE mg/dL
Specific Gravity, Urine: 1.02 (ref 1.005–1.030)
pH: 5 (ref 5.0–8.0)

## 2023-12-30 LAB — PROTIME-INR

## 2023-12-30 LAB — ETHANOL: Alcohol, Ethyl (B): <15 mg/dL (ref <15)

## 2023-12-30 MED ORDER — SODIUM CHLORIDE 0.9% FLUSH
3.0000 mL | Freq: Once | INTRAVENOUS | Status: AC
  Administered 2023-12-30: 3 mL via INTRAVENOUS

## 2023-12-30 MED ORDER — SODIUM CHLORIDE 0.9 % IV SOLN
500.0000 mg | Freq: Once | INTRAVENOUS | Status: AC
  Administered 2023-12-31: 500 mg via INTRAVENOUS
  Filled 2023-12-30: qty 5

## 2023-12-30 MED ORDER — IOHEXOL 350 MG/ML SOLN
75.0000 mL | Freq: Once | INTRAVENOUS | Status: AC | PRN
Start: 2023-12-30 — End: 2023-12-30
  Administered 2023-12-30: 100 mL via INTRAVENOUS

## 2023-12-30 MED ORDER — SODIUM CHLORIDE 0.9 % IV SOLN
2.0000 g | Freq: Once | INTRAVENOUS | Status: AC
  Administered 2023-12-30: 2 g via INTRAVENOUS
  Filled 2023-12-30: qty 20

## 2023-12-30 MED ORDER — SODIUM CHLORIDE 0.9 % IV BOLUS
500.0000 mL | Freq: Once | INTRAVENOUS | Status: AC
  Administered 2023-12-30: 500 mL via INTRAVENOUS

## 2023-12-30 NOTE — Consult Note (Signed)
 NEUROLOGY CONSULT NOTE   Date of service: December 30, 2023 Patient Name: Nathan Nicholson MRN:  [id number] DOB:  06/26/1955 Chief Complaint: dysarthria Requesting Provider: No att. providers found  History of Present Illness  Nathan Nicholson is a 68 y.o. male with hx of COPD, hypertension, MI, hyperlipidemia who presents with progressive confusion and dysarthria that started about 3:00 today.  Given the severity of his dysarthria, his family called 911 and he was transported as a code stroke.(They reported an LVO score of four).  On arrival, he was evaluated the bridge  LKW: 3 PM IV Thrombolysis: No, outside window EVT: no, no lvo   NIHSS components Score: Comment  1a Level of Conscious 0[]  1[x]  2[]  3[]      1b LOC Questions 0[]  1[x]  2[]       1c LOC Commands 0[x]  1[]  2[]       2 Best Gaze 0[x]  1[]  2[]       3 Visual 0[x]  1[]  2[]  3[]      4 Facial Palsy 0[x]  1[]  2[]  3[]      5a Motor Arm - left 0[]  1[x]  2[]  3[]  4[]  UN[]    5b Motor Arm - Right 0[]  1[x]  2[]  3[]  4[]  UN[]    6a Motor Leg - Left 0[x]  1[]  2[]  3[]  4[]  UN[]    6b Motor Leg - Right 0[]  1[x]  2[]  3[]  4[]  UN[]    7 Limb Ataxia 0[x]  1[]  2[]  UN[]      8 Sensory 0[x]  1[]  2[]  UN[]      9 Best Language 0[x]  1[]  2[]  3[]      10 Dysarthria 0[]  1[x]  2[]  UN[]      11 Extinct. 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 Karolynn by hematologist Dr. Cloretta   Myocardial infarction [facility])    Oxygen  dependent    2L via [place]   Pneumonia    Rheumatoid arthritis (HCC)     Past Surgical History:  Procedure Laterality Date   FOOT SURGERY     left   HERNIA REPAIR     IR KYPHO EA ADDL LEVEL THORACIC OR LUMBAR  06/17/2021   IR KYPHO EA  ADDL LEVEL THORACIC OR LUMBAR  07/12/2021   IR KYPHO LUMBAR INC FX REDUCE BONE BX UNI/BIL CANNULATION INC/IMAGING  07/11/2021   IR KYPHO LUMBAR INC FX REDUCE BONE BX UNI/BIL CANNULATION INC/IMAGING  10/05/2021   IR KYPHO THORACIC WITH BONE BIOPSY  06/17/2021   IR KYPHO THORACIC WITH BONE BIOPSY  08/15/2023   IR RADIOLOGIST EVAL & MGMT  05/18/2021   IR RADIOLOGIST EVAL & MGMT  08/08/2023   MULTIPLE TOOTH EXTRACTIONS     ALL TEETH EXT, DENTURES   RADIOLOGY WITH ANESTHESIA N/A 06/17/2021   Procedure: KYPHOPLASTY;  Surgeon: de Macedo Rodrigues, Katyucia, MD;  Location: [facility] OR;  Service: Radiology;  Laterality: N/A;   RADIOLOGY WITH ANESTHESIA N/A 07/11/2021   Procedure: KYPHOPLASTY;  Surgeon: de Macedo Rodrigues, Katyucia, MD;  Location: [facility] OR;  Service: Radiology;  Laterality: N/A;   RADIOLOGY WITH ANESTHESIA N/A 10/05/2021   Procedure: L3 Kyphoplasty;  Surgeon: de Macedo Rodrigues, Katyucia, MD;  Location: [facility] OR;  Service:  Radiology;  Laterality: N/A;   SPLENECTOMY     TOOTH EXTRACTION     TOTAL HIP ARTHROPLASTY Right 08/17/2020   Procedure: TOTAL HIP ARTHROPLASTY ANTERIOR APPROACH;  Surgeon: Ernie Cough, MD;  Location: [facility] ORS;  Service: Orthopedics;  Laterality: Right;  70 min    Family History: Family History  Problem Relation Age of Onset   Heart attack Brother     Social History  reports that he has been smoking cigarettes. He started smoking about 42 years ago. He has a 20 pack-year smoking history. He has never used smokeless tobacco. He reports that he does not currently use alcohol. He reports that he does not use drugs.  Allergies  Allergen Reactions   No Known Allergies Other (See Comments)    Medications   Current Facility-Administered Medications:    sodium chloride  flush (NS) 0.9 % injection 3 mL, 3 mL, Intravenous, Once, Lemly, Tatum N, MD  Current Outpatient Medications:    acetaminophen  (TYLENOL ) 500 MG tablet, Take 1,000 mg by mouth every 6 (six) hours as needed for moderate  pain., Disp: , Rfl:    albuterol  (VENTOLIN  HFA) 108 (90 Base) MCG/ACT inhaler, Inhale 2 puffs into the lungs every 4 (four) hours as needed (Breathing)., Disp: , Rfl:    alendronate (FOSAMAX) 70 MG tablet, Take 70 mg by mouth every Friday. Take with a full glass of water  on an empty stomach., Disp: , Rfl:    amoxicillin -clavulanate (AUGMENTIN ) 875-125 MG tablet, Take 1 tablet by mouth 2 (two) times daily., Disp: 20 tablet, Rfl: 0   aspirin  81 MG EC tablet, Take 81 mg by mouth every morning., Disp: , Rfl:    atorvastatin  (LIPITOR) 10 MG tablet, Take 10 mg by mouth every morning., Disp: , Rfl:    Budeson-Glycopyrrol-Formoterol  (BREZTRI  AEROSPHERE) 160-9-4.8 MCG/ACT AERO, Inhale 2 puffs into the lungs in the morning and at bedtime., Disp: , Rfl:    budesonide -glycopyrrolate -formoterol  (BREZTRI  AEROSPHERE) 160-9-4.8 MCG/ACT AERO inhaler, Inhale 2 puffs into the lungs in the morning and at bedtime., Disp: 10.7 g, Rfl: 5   Calcium  Carb-Cholecalciferol (CALCIUM  600+D3 PO), Take 1 tablet by mouth 2 (two) times daily., Disp: , Rfl:    clopidogrel (PLAVIX) 75 MG tablet, Take 75 mg by mouth daily., Disp: , Rfl:    cyanocobalamin  (,VITAMIN B-12,) 1000 MCG/ML injection, Inject 1,000 mcg into the muscle every 30 (thirty) days., Disp: , Rfl:    empagliflozin (JARDIANCE) 10 MG TABS tablet, Take 10 mg by mouth daily., Disp: , Rfl:    escitalopram (LEXAPRO) 10 MG tablet, Take 10 mg by mouth at bedtime., Disp: , Rfl:    ferrous sulfate  325 (65 FE) MG tablet, Take 325 mg by mouth daily., Disp: , Rfl:    folic acid  (FOLVITE ) 1 MG tablet, Take 1 mg by mouth every morning., Disp: , Rfl:    guaiFENesin  (MUCINEX ) 600 MG 12 hr tablet, Take 1 tablet (600 mg total) by mouth 2 (two) times daily as needed for cough or to loosen phlegm., Disp: 60 tablet, Rfl: 0   ipratropium-albuterol  (DUONEB) 0.5-2.5 (3) MG/3ML SOLN, Take 3 mLs by nebulization every 6 (six) hours as needed., Disp: , Rfl:    lisinopril -hydrochlorothiazide   (ZESTORETIC ) 20-12.5 MG tablet, Take 1 tablet by mouth daily. PT TO TAKE ONLY 1/2 TABLET AT HS (PER HIS REPORT), Disp: , Rfl:    meloxicam (MOBIC) 15 MG tablet, Take 15 mg by mouth daily., Disp: , Rfl:    methotrexate (RHEUMATREX) 2.5 MG tablet, Take 10 mg by  mouth every Tuesday. Caution:Chemotherapy. Protect from light., Disp: , Rfl:    nebivolol (BYSTOLIC) 10 MG tablet, Take 10 mg by mouth daily., Disp: , Rfl:    nitroGLYCERIN (NITROSTAT) 0.4 MG SL tablet, Place 0.4 mg under the tongue every 5 (five) minutes as needed for chest pain. Up to 3 doses., Disp: , Rfl:    oxyCODONE -acetaminophen  (PERCOCET) 10-325 MG tablet, Take 1 tablet by mouth every 8 (eight) hours as needed for pain. For only 10 days, Disp: , Rfl:    OXYGEN , Inhale 2 L into the lungs continuous., Disp: , Rfl:    senna-docusate (SENOKOT-S) 8.6-50 MG tablet, Take 1 tablet by mouth 2 (two) times daily as needed for moderate constipation., Disp: 20 tablet, Rfl: 0   Skin Protectants, Misc. (EUCERIN) cream, Apply 1 application. topically daily., Disp: , Rfl:    Vitamin D, Ergocalciferol, (DRISDOL) 1.25 MG (50000 UNIT) CAPS capsule, Take 50,000 Units by mouth every Monday., Disp: , Rfl:   Vitals   Vitals:   12/30/23 2144  Weight: 94.6 kg    Body mass index is 36.94 kg/m.   Physical Exam   Constitutional: Appears well-developed and well-nourished.  Neurologic Examination    Neuro: Mental Status: Patient is awake, alert, oriented to age but not month No signs of aphasia, but he is very dysarthric Cranial Nerves: II: Visual Fields are full. Pupils are equal, round, and reactive to light.   III,IV, VI: EOMI without ptosis or diploplia.  V: Facial sensation is symmetric to temperature VII: Facial movement is symmetric.  VIII: hearing is intact to voice X: Uvula elevates symmetrically XII: tongue is midline without atrophy or fasciculations.  Motor: He has drift with some asterixis in bilateral upper extremities and right  lower, he does clearly drift in his left lower, but confrontation everything is equal Sensory: Sensation is symmetric to light touch and temperature in the arms and legs. Cerebellar: He has some asterixis on finger-nose-finger bilaterally       Labs/Imaging/Neurodiagnostic studies   CBC: No results for input(s): WBC, NEUTROABS, HGB, HCT, MCV, PLT in the last 168 hours. Basic Metabolic Panel:  Lab Results  Component Value Date   NA 138 10/05/2021   K 3.4 (L) 10/05/2021   CO2 29 10/05/2021   GLUCOSE 98 10/05/2021   BUN 7 (L) 10/05/2021   CREATININE 0.84 10/05/2021   CALCIUM  9.0 10/05/2021   GFRNONAA >60 10/05/2021   Lipid Panel: No results found for: LDLCALC HgbA1c:  Lab Results  Component Value Date   HGBA1C 5.9 (H) 05/04/2021   Urine Drug Screen: No results found for: LABOPIA, COCAINSCRNUR, LABBENZ, AMPHETMU, THCU, LABBARB  Alcohol Level No results found for: [facility] INR  Lab Results  Component Value Date   INR 1.0 08/15/2023   APTT  Lab Results  Component Value Date   APTT 28 08/17/2020    CT Head without contrast(Personally reviewed): Negative   ASSESSMENT   YURIY CUI is a 68 y.o. male with severe dysarthria and mild encephalopathy in the setting of pneumonia.  My suspicion is that this is likely a metabolic encephalopathy, but given the degree of dysarthria I think would be reasonable to get an MRI.  I would only pursue stroke workup if the MRI is positive.  RECOMMENDATIONS  MRI brain Stroke workup only if positive. ______________________________________________________________________    Bonney Aisha Seals, MD [facility]

## 2023-12-30 NOTE — H&P (Signed)
 History and Physical    Nathan Nicholson FMW:[id number] DOB: 11-13-1955 DOA: 12/30/2023  Patient coming from: Home.  Chief Complaint: Lethargy and slurred speech.  HPI: Nathan Nicholson is a 68 y.o. male with history of COPD, CAD status post PCI in 2023, hypertension, rheumatoid arthritis was brought to the ER after patient was found to be increasingly lethargic sleepy and also having slurred speech since this morning.  Denies any weakness of the extremities.  Has not had any recent changes in medications and does not complain of any chest pain or shortness of breath.  Patient is largely bedbound but able to usually hold on a conversation.  Per family patient was doing fine yesterday.  ED Course: In the ER patient is found to be lethargic.  CT head shows nothing acute CT angiogram of the head and neck did not show any large vessel obstruction.  Neurology on-call was consulted.  MRI brain is pending.  ABG shows pH of 7.26 pCO2 of 90.  WBC count is 19.9.  Chest x-ray shows possible infiltrates.  Given the hypercarbic respiratory failure patient is placed on BiPAP and started on IV antibiotics for pneumonia admitted for further workup of acute hypercarbic respiratory failure with acute encephalopathy.  Review of Systems: As per HPI, rest all negative.   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 Karolynn by hematologist Dr. Cloretta   Myocardial infarction [facility])    Oxygen  dependent    2L via [place]   Pneumonia    Rheumatoid arthritis (HCC)     Past Surgical History:  Procedure Laterality Date   FOOT SURGERY     left   HERNIA REPAIR     IR KYPHO EA ADDL LEVEL THORACIC OR LUMBAR  06/17/2021   IR KYPHO EA ADDL LEVEL THORACIC OR LUMBAR  07/12/2021   IR  KYPHO LUMBAR INC FX REDUCE BONE BX UNI/BIL CANNULATION INC/IMAGING  07/11/2021   IR KYPHO LUMBAR INC FX REDUCE BONE BX UNI/BIL CANNULATION INC/IMAGING  10/05/2021   IR KYPHO THORACIC WITH BONE BIOPSY  06/17/2021   IR KYPHO THORACIC WITH BONE BIOPSY  08/15/2023   IR RADIOLOGIST EVAL & MGMT  05/18/2021   IR RADIOLOGIST EVAL & MGMT  08/08/2023   MULTIPLE TOOTH EXTRACTIONS     ALL TEETH EXT, DENTURES   RADIOLOGY WITH ANESTHESIA N/A 06/17/2021   Procedure: KYPHOPLASTY;  Surgeon: de Macedo Rodrigues, Katyucia, MD;  Location: [facility] OR;  Service: Radiology;  Laterality: N/A;   RADIOLOGY WITH ANESTHESIA N/A 07/11/2021   Procedure: KYPHOPLASTY;  Surgeon: de Macedo Rodrigues, Katyucia, MD;  Location: [facility] OR;  Service: Radiology;  Laterality: N/A;   RADIOLOGY WITH ANESTHESIA N/A 10/05/2021   Procedure: L3 Kyphoplasty;  Surgeon: de Macedo Rodrigues, Katyucia, MD;  Location: [facility] OR;  Service: Radiology;  Laterality: N/A;   SPLENECTOMY     TOOTH EXTRACTION     TOTAL HIP ARTHROPLASTY Right 08/17/2020   Procedure: TOTAL HIP ARTHROPLASTY ANTERIOR APPROACH;  Surgeon: Ernie Cough, MD;  Location: [facility] ORS;  Service: Orthopedics;  Laterality: Right;  70 min     reports that he has been smoking cigarettes. He started smoking about 42 years ago. He has a 20 pack-year smoking history. He has never used  smokeless tobacco. He reports that he does not currently use alcohol. He reports that he does not use drugs.  Allergies  Allergen Reactions   No Known Allergies Other (See Comments)    Family History  Problem Relation Age of Onset   Heart attack Brother     Prior to Admission medications   Medication Sig Start Date End Date Taking? Authorizing Provider  acetaminophen  (TYLENOL ) 500 MG tablet Take 1,000 mg by mouth every 6 (six) hours as needed for moderate pain.    [provider]  albuterol  (VENTOLIN  HFA) 108 (90 Base) MCG/ACT inhaler Inhale 2 puffs into the lungs every 4 (four) hours as needed (Breathing). 04/15/21    [provider]  alendronate (FOSAMAX) 70 MG tablet Take 70 mg by mouth every Friday. Take with a full glass of water  on an empty stomach.    [provider]  amoxicillin -clavulanate (AUGMENTIN ) 875-125 MG tablet Take 1 tablet by mouth 2 (two) times daily. 10/23/23   Hope Almarie ORN, NP  aspirin  81 MG EC tablet Take 81 mg by mouth every morning.    [provider]  atorvastatin  (LIPITOR) 10 MG tablet Take 10 mg by mouth every morning.    [provider]  Budeson-Glycopyrrol-Formoterol  (BREZTRI  AEROSPHERE) 160-9-4.8 MCG/ACT AERO Inhale 2 puffs into the lungs in the morning and at bedtime. 04/20/23   Hope Almarie ORN, NP  budesonide -glycopyrrolate -formoterol  (BREZTRI  AEROSPHERE) 160-9-4.8 MCG/ACT AERO inhaler Inhale 2 puffs into the lungs in the morning and at bedtime. 11/12/23   Hope Almarie ORN, NP  Calcium  Carb-Cholecalciferol (CALCIUM  600+D3 PO) Take 1 tablet by mouth 2 (two) times daily.    [provider]  clopidogrel (PLAVIX) 75 MG tablet Take 75 mg by mouth daily. 01/23/22   [provider]  cyanocobalamin  (,VITAMIN B-12,) 1000 MCG/ML injection Inject 1,000 mcg into the muscle every 30 (thirty) days.    [provider]  empagliflozin (JARDIANCE) 10 MG TABS tablet Take 10 mg by mouth daily.    [provider]  escitalopram (LEXAPRO) 10 MG tablet Take 10 mg by mouth at bedtime.    [provider]  ferrous sulfate  325 (65 FE) MG tablet Take 325 mg by mouth daily.    [provider]  folic acid  (FOLVITE ) 1 MG tablet Take 1 mg by mouth every morning.    [provider]  guaiFENesin  (MUCINEX ) 600 MG 12 hr tablet Take 1 tablet (600 mg total) by mouth 2 (two) times daily as needed for cough or to loosen phlegm. 10/23/23   Hope Almarie ORN, NP  ipratropium-albuterol  (DUONEB) 0.5-2.5 (3) MG/3ML SOLN Take 3 mLs by nebulization every 6 (six) hours as needed. 04/20/23   Hope Almarie ORN, NP   lisinopril -hydrochlorothiazide  (ZESTORETIC ) 20-12.5 MG tablet Take 1 tablet by mouth daily. PT TO TAKE ONLY 1/2 TABLET AT HS (PER HIS REPORT)    [provider]  meloxicam (MOBIC) 15 MG tablet Take 15 mg by mouth daily.    [provider]  methotrexate (RHEUMATREX) 2.5 MG tablet Take 10 mg by mouth every Tuesday. Caution:Chemotherapy. Protect from light.    [provider]  nebivolol (BYSTOLIC) 10 MG tablet Take 10 mg by mouth daily. 01/23/22   [provider]  nitroGLYCERIN (NITROSTAT) 0.4 MG SL tablet Place 0.4 mg under the tongue every 5 (five) minutes as needed for chest pain. Up to 3 doses. 01/23/22   [provider]  oxyCODONE -acetaminophen  (PERCOCET) 10-325 MG tablet Take 1 tablet by mouth every 8 (eight)  hours as needed for pain. For only 10 days    [provider]  OXYGEN  Inhale 2 L into the lungs continuous.    [provider]  senna-docusate (SENOKOT-S) 8.6-50 MG tablet Take 1 tablet by mouth 2 (two) times daily as needed for moderate constipation. 05/13/21   Shona Terry SAILOR, DO  Skin Protectants, Misc. (EUCERIN) cream Apply 1 application. topically daily.    [provider]  Vitamin D, Ergocalciferol, (DRISDOL) 1.25 MG (50000 UNIT) CAPS capsule Take 50,000 Units by mouth every Monday.    [provider]    Physical Exam: Constitutional: Moderately built and nourished. Vitals:   12/30/23 2206 12/30/23 2207 12/30/23 2215 12/30/23 2228  BP:   (!) 111/50   Pulse: 90  74   Resp: 17 20 20    SpO2: (!) 75%  100%   Weight:    94.6 kg  Height:    5' 3 (1.6 m)   Eyes: Anicteric no pallor. ENMT: No discharge from the ears eyes nose or mouth. Neck: No mass felt.  No neck rigidity. Respiratory: Mild expiratory wheeze and no crepitations. Cardiovascular: S1-S2 heard. Abdomen: Soft nontender bowel sound present. Musculoskeletal: No edema. Skin: No rash. Neurologic: Patient is lethargic but follows commands  moving all extremities oriented to name and place pupils equal and reactive to light. Psychiatric: Lethargic but oriented to name and place.   Labs on Admission: I have personally reviewed following labs and imaging studies  CBC: Recent Labs  Lab 12/30/23 2152  WBC 19.9*  NEUTROABS 14.1*  HGB 15.4  HCT 51.1  MCV 106.7*  PLT PLATELET CLUMPS NOTED ON SMEAR, COUNT APPEARS ADEQUATE   Basic Metabolic Panel: Recent Labs  Lab 12/30/23 2152  NA 142  K 4.8  CL 96*  CO2 34*  GLUCOSE 106*  BUN 13  CREATININE 0.98  CALCIUM  9.1   GFR: Estimated Creatinine Clearance: 74.5 mL/min (by C-G formula based on SCr of 0.98 mg/dL). Liver Function Tests: Recent Labs  Lab 12/30/23 2152  AST 29  ALT 30  ALKPHOS 129*  BILITOT 0.7  PROT 7.1  ALBUMIN 2.8*   No results for input(s): LIPASE, AMYLASE in the last 168 hours. No results for input(s): AMMONIA in the last 168 hours. Coagulation Profile: Recent Labs  Lab 12/30/23 2303  INR 1.0   Cardiac Enzymes: No results for input(s): CKTOTAL, CKMB, CKMBINDEX, TROPONINI in the last 168 hours. BNP (last 3 results) No results for input(s): PROBNP in the last 8760 hours. HbA1C: No results for input(s): HGBA1C in the last 72 hours. CBG: Recent Labs  Lab 12/30/23 2143  GLUCAP 105*   Lipid Profile: No results for input(s): CHOL, HDL, LDLCALC, TRIG, CHOLHDL, LDLDIRECT in the last 72 hours. Thyroid  Function Tests: No results for input(s): TSH, T4TOTAL, FREET4, T3FREE, THYROIDAB in the last 72 hours. Anemia Panel: No results for input(s): VITAMINB12, FOLATE, FERRITIN, TIBC, IRON, RETICCTPCT in the last 72 hours. Urine analysis:    Component Value Date/Time   COLORURINE YELLOW 12/30/2023 2303   APPEARANCEUR CLEAR 12/30/2023 2303   LABSPEC 1.020 12/30/2023 2303   PHURINE 5.0 12/30/2023 2303   GLUCOSEU >=500 (A) 12/30/2023 2303   HGBUR NEGATIVE 12/30/2023 2303   BILIRUBINUR NEGATIVE  12/30/2023 2303   KETONESUR NEGATIVE 12/30/2023 2303   PROTEINUR NEGATIVE 12/30/2023 2303   NITRITE POSITIVE (A) 12/30/2023 2303   LEUKOCYTESUR NEGATIVE 12/30/2023 2303   Sepsis Labs: @LABRCNTIP (procalcitonin:4,lacticidven:4) )No results found for this or any previous visit (from the past 240 hours).  Radiological Exams on Admission: CT ANGIO HEAD NECK W WO CM W PERF (CODE STROKE) Result Date: 12/30/2023 EXAM: CTA Head and Neck with Perfusion 12/30/2023 10:04:03 PM TECHNIQUE: CTA of the head and neck was performed with the administration of intravenous contrast. 3D postprocessing with multiplanar reconstructions and MIPs was performed to evaluate the vascular anatomy. Cerebral perfusion analysis using computed tomography with contrast administration, including post-processing of parametric maps with determination of cerebral blood flow, cerebral blood volume, mean transit time and time-to-maximum. Automated exposure control, iterative reconstruction, and/or weight based adjustment of the mA/kV was utilized to reduce the radiation dose to as low as reasonably achievable. COMPARISON: None available CLINICAL HISTORY: Neuro deficit, acute, stroke suspected. Code stroke, aphasia and slurred speech; Dr. Michaela FINDINGS: AORTIC ARCH AND ARCH VESSELS: No dissection or arterial injury. No significant stenosis of the brachiocephalic or subclavian arteries. CERVICAL CAROTID ARTERIES: Approximately 40% of the right ICA proximally. CERVICAL VERTEBRAL ARTERIES: No dissection, arterial injury, or significant stenosis. LUNGS AND MEDIASTINUM: Unremarkable. SOFT TISSUES: No acute abnormality. BONES: No acute abnormality. ANTERIOR CIRCULATION: No significant stenosis of the internal carotid arteries. No significant stenosis of the anterior cerebral arteries. No significant stenosis of the middle cerebral arteries. No aneurysm. POSTERIOR CIRCULATION: No significant stenosis of the posterior cerebral arteries. No  significant stenosis of the basilar artery. No significant stenosis of the vertebral arteries. No aneurysm. OTHER: No dural venous sinus thrombosis on this non-dedicated study. EXAM QUALITY: Exam quality is adequate with diagnostic perfusion maps. No significant motion artifact. Appropriate arterial inflow and venous outflow curves. CORE INFARCT (CBF<30% volume): 0 mL TOTAL HYPOPERFUSION (Tmax>6s volume): 10 mL Mismatch volume: 10  mL Location: High left partietal convexity and left temporal bone. IMPRESSION: 1. No acute large vessel occlusion. 2. Approximately 40% of the right proximal ICA. 3. No convincing infarct or penumbra. Reported mismatch is favored artifactual. MRI could better assess for acute infarct if clinically warranted. Electronically signed by: Gilmore Molt MD 12/30/2023 10:32 PM EDT RP Workstation: [id number]   DG Chest Portable 1 View Result Date: 12/30/2023 EXAM: 1 VIEW(S) XRAY OF THE CHEST 12/30/2023 10:24:00 PM COMPARISON: Chest x-ray 07/10/2023. CLINICAL HISTORY: ams workup. ams FINDINGS: LUNGS AND PLEURA: Increased central interstitial markings bilaterally. Increased interstitial opacities in the left lung base. No pulmonary edema. No pleural effusion. No pneumothorax. HEART AND MEDIASTINUM: No acute abnormality of the cardiac and mediastinal silhouettes. BONES AND SOFT TISSUES: No acute osseous abnormality. IMPRESSION: 1. Increased central interstitial markings bilaterally and increased interstitial opacities in the left lung base. Findings may be related to mild edema or an infectious/inflammatory process. Electronically signed by: Greig Pique MD 12/30/2023 10:29 PM EDT RP Workstation: [id number]   CT HEAD CODE STROKE WO CONTRAST Result Date: 12/30/2023 EXAM: CT HEAD WITHOUT 12/30/2023 09:50:34 PM TECHNIQUE: CT of the head was performed without the administration of intravenous contrast. Automated exposure control, iterative reconstruction, and/or weight based adjustment of the  mA/kV was utilized to reduce the radiation dose to as low as reasonably achievable. COMPARISON: None available. CLINICAL HISTORY: Neuro deficit, acute, stroke suspected. Dysarthria FINDINGS: BRAIN AND VENTRICLES: No acute intracranial hemorrhage. No mass effect or midline shift. No extra-axial fluid collection. No evidence of acute infarct. No hydrocephalus. White matter changes, compatible with chronic microvascular ischemic change. Motion limited study. ORBITS: No acute abnormality. SINUSES AND MASTOIDS: No acute abnormality. SOFT TISSUES AND SKULL: No acute skull fracture. No acute soft tissue abnormality. Other: Findings conveyed to Dr. Michaela via pager at 10:04 PM. . IMPRESSION: 1. Motion-limited study  without evidence of acute intracranial abnormality. ASPECTS 10. Electronically signed by: Gilmore Molt MD 12/30/2023 10:05 PM EDT RP Workstation: [id number]     Assessment/Plan Principal Problem:   Acute encephalopathy    Acute respiratory failure with hypercarbia likely from COPD exacerbation possibly OSA.  Will place patient on BiPAP now.  Discussed with pulmonary critical care.  Plan is to keep patient on scheduled and as needed nebulizers IV steroids antibiotics recheck ABG in few hours. Acute encephalopathy suspect likely from acute hypercarbic respiratory failure.  However since patient has significant dysarthria initially on presentation neurology has recommended getting MRI brain.  Repeat ABG in few hours. Possible pneumonia on empiric antibiotics.  Speech therapy evaluation for aspiration once patient off BiPAP. CAD status post PCI in 2023.  Patient is presently on BiPAP.  Lethargic to take anything orally.  Will keep patient on aspirin  per rectally for now.  EKG and troponins are pending. Hypertension blood pressures in the low normal.  Follow blood pressure trends. Macrocytosis check B12 and folate levels. History of rheumatoid arthritis per the chart. Diabetes mellitus type 2  mentioned in the chart check hemoglobin A1c.  Not on any home medications.  Since patient has acute hypercarbic respiratory failure with acute encephalopathy will need close monitoring further workup and more than 2 midnight stay.   DVT prophylaxis: Lovenox . Code Status: Full code confirmed with patient's wife at the bedside. Family Communication: Patient's wife. Disposition Plan: Progressive care. Consults called: Discussed with critical care and neurology. Admission status: Inpatient.

## 2023-12-30 NOTE — ED Provider Notes (Signed)
 [place] EMERGENCY DEPARTMENT AT [facility] Provider Note   CSN: [id number] Arrival date & time: 12/30/23  2137  An emergency department physician performed an initial assessment on this suspected stroke patient at 2138.  Patient presents with: Code Stroke   Nathan Nicholson is a 68 y.o. male.   HPI    Patient presents due to code stroke.  Patient began slurring speech around 3 PM today.  No other deficit was appreciated.  EMS noted the patient was leaning to the right.  Right-sided weakness for EMS.  Subsequently EMS called code stroke.  Currently, patient has a history of COPD.  On 2 to 3 L nasal cannula at baseline.  Has not been complain about any kind of fever or chills.  No chest pain or shortness of breath.  No abdominal pain.  No nausea or diarrhea.  Otherwise, has been acting normal until he woke up from his nap today and he was urinating himself   Upon exam, patient denies all complaints.  No chest pain or shortness of breath.  No abdominal pain.  No nausea vomit diarrhea.    Prior to Admission medications   Medication Sig Start Date End Date Taking? Authorizing Provider  acetaminophen  (TYLENOL ) 500 MG tablet Take 1,000 mg by mouth every 6 (six) hours as needed for moderate pain.    [provider]  albuterol  (VENTOLIN  HFA) 108 (90 Base) MCG/ACT inhaler Inhale 2 puffs into the lungs every 4 (four) hours as needed (Breathing). 04/15/21   [provider]  alendronate (FOSAMAX) 70 MG tablet Take 70 mg by mouth every Friday. Take with a full glass of water  on an empty stomach.    [provider]  amoxicillin -clavulanate (AUGMENTIN ) 875-125 MG tablet Take 1 tablet by mouth 2 (two) times daily. 10/23/23   Hope Almarie ORN, NP  aspirin  81 MG EC tablet Take 81 mg by mouth every morning.    [provider]  atorvastatin  (LIPITOR) 10 MG tablet Take 10 mg by mouth every morning.    [provider]   Budeson-Glycopyrrol-Formoterol  (BREZTRI  AEROSPHERE) 160-9-4.8 MCG/ACT AERO Inhale 2 puffs into the lungs in the morning and at bedtime. 04/20/23   Hope Almarie ORN, NP  budesonide -glycopyrrolate -formoterol  (BREZTRI  AEROSPHERE) 160-9-4.8 MCG/ACT AERO inhaler Inhale 2 puffs into the lungs in the morning and at bedtime. 11/12/23   Hope Almarie ORN, NP  Calcium  Carb-Cholecalciferol (CALCIUM  600+D3 PO) Take 1 tablet by mouth 2 (two) times daily.    [provider]  clopidogrel (PLAVIX) 75 MG tablet Take 75 mg by mouth daily. 01/23/22   [provider]  cyanocobalamin  (,VITAMIN B-12,) 1000 MCG/ML injection Inject 1,000 mcg into the muscle every 30 (thirty) days.    [provider]  empagliflozin (JARDIANCE) 10 MG TABS tablet Take 10 mg by mouth daily.    [provider]  escitalopram (LEXAPRO) 10 MG tablet Take 10 mg by mouth at bedtime.    [provider]  ferrous sulfate  325 (65 FE) MG tablet Take 325 mg by mouth daily.    [provider]  folic acid  (FOLVITE ) 1 MG tablet Take 1 mg by mouth every morning.    [provider]  guaiFENesin  (MUCINEX ) 600 MG 12 hr tablet Take 1 tablet (600 mg total) by mouth 2 (two) times daily as needed for cough or to loosen phlegm. 10/23/23   Hope Almarie ORN, NP  ipratropium-albuterol  (DUONEB) 0.5-2.5 (3) MG/3ML SOLN Take 3 mLs by nebulization every 6 (six) hours as  needed. 04/20/23   Hope Almarie ORN, NP  lisinopril -hydrochlorothiazide  (ZESTORETIC ) 20-12.5 MG tablet Take 1 tablet by mouth daily. PT TO TAKE ONLY 1/2 TABLET AT HS (PER HIS REPORT)    [provider]  meloxicam (MOBIC) 15 MG tablet Take 15 mg by mouth daily.    [provider]  methotrexate (RHEUMATREX) 2.5 MG tablet Take 10 mg by mouth every Tuesday. Caution:Chemotherapy. Protect from light.    [provider]  nebivolol (BYSTOLIC) 10 MG tablet Take 10 mg by mouth daily. 01/23/22   [provider]   nitroGLYCERIN (NITROSTAT) 0.4 MG SL tablet Place 0.4 mg under the tongue every 5 (five) minutes as needed for chest pain. Up to 3 doses. 01/23/22   [provider]  oxyCODONE -acetaminophen  (PERCOCET) 10-325 MG tablet Take 1 tablet by mouth every 8 (eight) hours as needed for pain. For only 10 days    [provider]  OXYGEN  Inhale 2 L into the lungs continuous.    [provider]  senna-docusate (SENOKOT-S) 8.6-50 MG tablet Take 1 tablet by mouth 2 (two) times daily as needed for moderate constipation. 05/13/21   Shona Terry SAILOR, DO  Skin Protectants, Misc. (EUCERIN) cream Apply 1 application. topically daily.    [provider]  Vitamin D, Ergocalciferol, (DRISDOL) 1.25 MG (50000 UNIT) CAPS capsule Take 50,000 Units by mouth every Monday.    [provider]    Allergies: No known allergies    Review of Systems  Constitutional:  Negative for chills and fever.  HENT:  Negative for ear pain and sore throat.   Eyes:  Negative for pain and visual disturbance.  Respiratory:  Negative for cough and shortness of breath.   Cardiovascular:  Negative for chest pain and palpitations.  Gastrointestinal:  Negative for abdominal pain and vomiting.  Genitourinary:  Negative for dysuria and hematuria.  Musculoskeletal:  Negative for arthralgias and back pain.  Skin:  Negative for color change and rash.  Neurological:  Negative for seizures and syncope.  All other systems reviewed and are negative.   Updated Vital Signs BP (!) 111/50   Pulse 74   Resp 20   Ht 5' 3 (1.6 m)   Wt 94.6 kg   SpO2 100%   BMI 36.94 kg/m   Physical Exam Vitals and nursing note reviewed.  Constitutional:      General: He is not in acute distress.    Appearance: He is well-developed.  HENT:     Head: Normocephalic and atraumatic.  Eyes:     Conjunctiva/sclera: Conjunctivae normal.  Cardiovascular:     Rate and Rhythm: Normal rate and regular rhythm.     Heart sounds: No  murmur heard. Pulmonary:     Effort: Pulmonary effort is normal. No respiratory distress.     Breath sounds: Normal breath sounds.  Abdominal:     Palpations: Abdomen is soft.     Tenderness: There is no abdominal tenderness.  Musculoskeletal:        General: No swelling.     Cervical back: Neck supple.  Skin:    General: Skin is warm and dry.     Capillary Refill: Capillary refill takes less than 2 seconds.  Neurological:     Mental Status: He is alert. He is disoriented.  Psychiatric:        Mood and Affect: Mood normal.     1a Level of Conscious 0[]  1[x]  2[]  3[]         1b LOC Questions 0[]   1[x]  2[]           1c LOC Commands 0[x]  1[]  2[]           2 Best Gaze 0[x]  1[]  2[]           3 Visual 0[]  1[]  2[x]  3[]         4 Facial Palsy 0[x]  1[]  2[]  3[]         5a Motor Arm - left 0[]  1[x]  2[]  3[]  4[]  UN[]     5b Motor Arm - Right 0[]  1[x]  2[]  3[]  4[]  UN[]     6a Motor Leg - Left 0[x]  1[]  2[]  3[]  4[]  UN[]     6b Motor Leg - Right 0[]  1[x]  2[]  3[]  4[]  UN[]     7 Limb Ataxia 0[x]  1[]  2[]  UN[]         8 Sensory 0[x]  1[]  2[]  UN[]         9 Best Language 0[x]  1[]  2[]  3[]         10 Dysarthria 0[]  1[x]  2[]  UN[]         11 Extinct. and Inattention 0[x]  1[]  2[]           TOTAL: 8      (all labs ordered are listed, but only abnormal results are displayed) Labs Reviewed  CBC - Abnormal; Notable for the following components:      Result Value   WBC 19.9 (*)    MCV 106.7 (*)    RDW 16.7 (*)    All other components within normal limits  DIFFERENTIAL - Abnormal; Notable for the following components:   Neutro Abs 14.1 (*)    Monocytes Absolute 1.9 (*)    Abs Immature Granulocytes 0.43 (*)    All other components within normal limits  COMPREHENSIVE METABOLIC PANEL WITH GFR - Abnormal; Notable for the following components:   Chloride 96 (*)    CO2 34 (*)    Glucose, Bld 106 (*)    Albumin 2.8 (*)    Alkaline Phosphatase 129 (*)    All other components within normal limits  URINALYSIS, ROUTINE  W REFLEX MICROSCOPIC - Abnormal; Notable for the following components:   Glucose, UA >=500 (*)    Nitrite POSITIVE (*)    Bacteria, UA MANY (*)    All other components within normal limits  CBG MONITORING, ED - Abnormal; Notable for the following components:   Glucose-Capillary 105 (*)    All other components within normal limits  CULTURE, BLOOD (ROUTINE X 2)  CULTURE, BLOOD (ROUTINE X 2)  ETHANOL  PROTIME-INR  APTT  I-STAT CHEM 8, ED    EKG: EKG Interpretation Date/Time:  Sunday December 30 2023 22:06:54 EDT Ventricular Rate:  75 PR Interval:  179 QRS Duration:  88 QT Interval:  382 QTC Calculation: 427 R Axis:   58  Text Interpretation: Sinus rhythm Borderline ST elevation, lateral leads Confirmed by Simon Rea [phone]) on 12/30/2023 10:49:53 PM  Radiology: CT ANGIO HEAD NECK W WO CM W PERF (CODE STROKE) Result Date: 12/30/2023 EXAM: CTA Head and Neck with Perfusion 12/30/2023 10:04:03 PM TECHNIQUE: CTA of the head and neck was performed with the administration of intravenous contrast. 3D postprocessing with multiplanar reconstructions and MIPs was performed to evaluate the vascular anatomy. Cerebral perfusion analysis using computed tomography with contrast administration, including post-processing of parametric maps with determination of cerebral blood flow, cerebral blood volume, mean transit time and time-to-maximum. Automated exposure control, iterative reconstruction, and/or weight based adjustment of the mA/kV was utilized to reduce the radiation dose to as low  as reasonably achievable. COMPARISON: None available CLINICAL HISTORY: Neuro deficit, acute, stroke suspected. Code stroke, aphasia and slurred speech; Dr. Michaela FINDINGS: AORTIC ARCH AND ARCH VESSELS: No dissection or arterial injury. No significant stenosis of the brachiocephalic or subclavian arteries. CERVICAL CAROTID ARTERIES: Approximately 40% of the right ICA proximally. CERVICAL VERTEBRAL ARTERIES: No  dissection, arterial injury, or significant stenosis. LUNGS AND MEDIASTINUM: Unremarkable. SOFT TISSUES: No acute abnormality. BONES: No acute abnormality. ANTERIOR CIRCULATION: No significant stenosis of the internal carotid arteries. No significant stenosis of the anterior cerebral arteries. No significant stenosis of the middle cerebral arteries. No aneurysm. POSTERIOR CIRCULATION: No significant stenosis of the posterior cerebral arteries. No significant stenosis of the basilar artery. No significant stenosis of the vertebral arteries. No aneurysm. OTHER: No dural venous sinus thrombosis on this non-dedicated study. EXAM QUALITY: Exam quality is adequate with diagnostic perfusion maps. No significant motion artifact. Appropriate arterial inflow and venous outflow curves. CORE INFARCT (CBF<30% volume): 0 mL TOTAL HYPOPERFUSION (Tmax>6s volume): 10 mL Mismatch volume: 10  mL Location: High left partietal convexity and left temporal bone. IMPRESSION: 1. No acute large vessel occlusion. 2. Approximately 40% of the right proximal ICA. 3. No convincing infarct or penumbra. Reported mismatch is favored artifactual. MRI could better assess for acute infarct if clinically warranted. Electronically signed by: Gilmore Molt MD 12/30/2023 10:32 PM EDT RP Workstation: [id number]   DG Chest Portable 1 View Result Date: 12/30/2023 EXAM: 1 VIEW(S) XRAY OF THE CHEST 12/30/2023 10:24:00 PM COMPARISON: Chest x-ray 07/10/2023. CLINICAL HISTORY: ams workup. ams FINDINGS: LUNGS AND PLEURA: Increased central interstitial markings bilaterally. Increased interstitial opacities in the left lung base. No pulmonary edema. No pleural effusion. No pneumothorax. HEART AND MEDIASTINUM: No acute abnormality of the cardiac and mediastinal silhouettes. BONES AND SOFT TISSUES: No acute osseous abnormality. IMPRESSION: 1. Increased central interstitial markings bilaterally and increased interstitial opacities in the left lung base. Findings  may be related to mild edema or an infectious/inflammatory process. Electronically signed by: Greig Pique MD 12/30/2023 10:29 PM EDT RP Workstation: [id number]   CT HEAD CODE STROKE WO CONTRAST Result Date: 12/30/2023 EXAM: CT HEAD WITHOUT 12/30/2023 09:50:34 PM TECHNIQUE: CT of the head was performed without the administration of intravenous contrast. Automated exposure control, iterative reconstruction, and/or weight based adjustment of the mA/kV was utilized to reduce the radiation dose to as low as reasonably achievable. COMPARISON: None available. CLINICAL HISTORY: Neuro deficit, acute, stroke suspected. Dysarthria FINDINGS: BRAIN AND VENTRICLES: No acute intracranial hemorrhage. No mass effect or midline shift. No extra-axial fluid collection. No evidence of acute infarct. No hydrocephalus. White matter changes, compatible with chronic microvascular ischemic change. Motion limited study. ORBITS: No acute abnormality. SINUSES AND MASTOIDS: No acute abnormality. SOFT TISSUES AND SKULL: No acute skull fracture. No acute soft tissue abnormality. Other: Findings conveyed to Dr. Michaela via pager at 10:04 PM. . IMPRESSION: 1. Motion-limited study without evidence of acute intracranial abnormality. ASPECTS 10. Electronically signed by: Gilmore Molt MD 12/30/2023 10:05 PM EDT RP Workstation: [id number]     Procedures   Medications Ordered in the ED  cefTRIAXone  (ROCEPHIN ) 2 g in sodium chloride  0.9 % 100 mL IVPB (2 g Intravenous New Bag/Given 12/30/23 2306)  azithromycin  (ZITHROMAX ) 500 mg in sodium chloride  0.9 % 250 mL IVPB (has no administration in time range)  sodium chloride  flush (NS) 0.9 % injection 3 mL (3 mLs Intravenous Given 12/30/23 2241)  iohexol  (OMNIPAQUE ) 350 MG/ML injection 75 mL (100 mLs Intravenous Contrast Given 12/30/23 2205)  sodium chloride  0.9 %  bolus 500 mL (500 mLs Intravenous New Bag/Given 12/30/23 2304)                                    Medical Decision  Making Amount and/or Complexity of Data Reviewed Labs: ordered. Radiology: ordered.     HPI:   Patient presents due to code stroke.  Patient began slurring speech around 3 PM today.  No other deficit was appreciated.  EMS noted the patient was leaning to the right.  Right-sided weakness for EMS.  Subsequently EMS called code stroke.  Currently, patient has a history of COPD.  On 2 to 3 L nasal cannula at baseline.  Has not been complain about any kind of fever or chills.  No chest pain or shortness of breath.  No abdominal pain.  No nausea or diarrhea.  Otherwise, has been acting normal until he woke up from his nap today and he was urinating himself   Upon exam, patient denies all complaints.  No chest pain or shortness of breath.  No abdominal pain.  No nausea vomit diarrhea.   MDM:   Upon exam, patient alert to self but not time or place.  NIH of 8.  Lung exam seems to be more generalized in nature.  Question whether or not this could be more encephalopathic from infectious etiology.  Patient was a stroke alert.  Acting airway.  No indication for emergent intubation.  Patient was taken to CT scan for stat head CT with neurology.  Reexamination.  Patient has a cough.  On 2 3 L which is baseline.  Will send off further laboratory workup as well as chest x-ray to rule out any kind of evidence of infectious pathology.  Reevaluation:   Upon reexamination, patient hemodynamically stable 2 to 3 L nasal cannula.  Still has generalized encephalopathy.  Once again, seems more generalized in nature.  Has some bilateral arm drift.  Some bilateral leg drift.   CT head and CTA head unremarkable.  No LVO.  Not a TNK candidate.  No thrombectomy candidate.  Neurology recommended MRI brain.  Patient does have a leukocytosis and left shift.  X-ray concerning for possible pneumonia.  Given this, start patient on ceftriaxone  azithromycin .  I think this could also explain patient's altered mental  status.  Otherwise, completely soft and benign abdomen.  No rebound guarding or tenderness.  Will obtain UA as well.   Plan on admission for encephalopathy.  MRI brain ordered.   Interventions: ceftriaxone , azithromycin   EKG Interpreted by Me: NSR   Cardiac Tele Interpreted by Me: NSR   I have independently interpreted the CXR  and CT  images and agree with the radiologist finding   Social Determinant of Health: None   Disposition and Follow Up: Admit     CRITICAL CARE Performed by: Lavonia LOISE Pat   Total critical care time: 45 minutes  Critical care time was exclusive of separately billable procedures and treating other patients.  Critical care was necessary to treat or prevent imminent or life-threatening deterioration.  Critical care was time spent personally by me on the following activities: development of treatment plan with patient and/or surrogate as well as nursing, discussions with consultants, evaluation of patient's response to treatment, examination of patient, obtaining history from patient or surrogate, ordering and performing treatments and interventions, ordering and review of laboratory studies, ordering and review of radiographic studies, pulse oximetry and re-evaluation of  patient's condition.     Final diagnoses:  Slurred speech  Disorientation  Pneumonia of left lower lobe due to infectious organism    ED Discharge Orders     None          Simon Lavonia SAILOR, MD 12/30/23 [phone]

## 2023-12-30 NOTE — ED Provider Notes (Signed)
 Received patient in turnover from Dr. Simon.  Please see their note for further details of Hx, PE.  Briefly patient is a 68 y.o. male with a Code Stroke .  Patient with acute encephalopathy, thought to be concerning for pneumonia.  Plan for hospitalist admission.   Procedures     Emil Share, DO 12/31/23 0121

## 2023-12-30 NOTE — ED Triage Notes (Signed)
 Pt coming in from home via [employer]. Pt began slurring speech at around 1500 today . Pt family did not see any other deficit at that time. EMS noted that pt is leaning to the right. Pt had right sided weakness for EMS  EMS vitals  CBG 136 HR 75 BP 110/66  Spo2 98% ra

## 2023-12-31 ENCOUNTER — Inpatient Hospital Stay (HOSPITAL_COMMUNITY)

## 2023-12-31 DIAGNOSIS — J449 Chronic obstructive pulmonary disease, unspecified: Secondary | ICD-10-CM | POA: Diagnosis not present

## 2023-12-31 DIAGNOSIS — I700 Atherosclerosis of aorta: Secondary | ICD-10-CM | POA: Diagnosis not present

## 2023-12-31 DIAGNOSIS — R918 Other nonspecific abnormal finding of lung field: Secondary | ICD-10-CM | POA: Diagnosis not present

## 2023-12-31 DIAGNOSIS — R0609 Other forms of dyspnea: Secondary | ICD-10-CM | POA: Diagnosis not present

## 2023-12-31 DIAGNOSIS — J9601 Acute respiratory failure with hypoxia: Secondary | ICD-10-CM | POA: Diagnosis not present

## 2023-12-31 DIAGNOSIS — J189 Pneumonia, unspecified organism: Secondary | ICD-10-CM | POA: Diagnosis not present

## 2023-12-31 DIAGNOSIS — I214 Non-ST elevation (NSTEMI) myocardial infarction: Secondary | ICD-10-CM | POA: Diagnosis not present

## 2023-12-31 DIAGNOSIS — R569 Unspecified convulsions: Secondary | ICD-10-CM | POA: Diagnosis not present

## 2023-12-31 DIAGNOSIS — Z7902 Long term (current) use of antithrombotics/antiplatelets: Secondary | ICD-10-CM | POA: Diagnosis not present

## 2023-12-31 DIAGNOSIS — J9602 Acute respiratory failure with hypercapnia: Secondary | ICD-10-CM | POA: Diagnosis not present

## 2023-12-31 DIAGNOSIS — E119 Type 2 diabetes mellitus without complications: Secondary | ICD-10-CM | POA: Diagnosis not present

## 2023-12-31 DIAGNOSIS — E66812 Obesity, class 2: Secondary | ICD-10-CM | POA: Diagnosis not present

## 2023-12-31 DIAGNOSIS — R4781 Slurred speech: Secondary | ICD-10-CM | POA: Diagnosis not present

## 2023-12-31 DIAGNOSIS — D649 Anemia, unspecified: Secondary | ICD-10-CM | POA: Diagnosis not present

## 2023-12-31 DIAGNOSIS — R001 Bradycardia, unspecified: Secondary | ICD-10-CM | POA: Diagnosis not present

## 2023-12-31 DIAGNOSIS — Z8249 Family history of ischemic heart disease and other diseases of the circulatory system: Secondary | ICD-10-CM | POA: Diagnosis not present

## 2023-12-31 DIAGNOSIS — R410 Disorientation, unspecified: Secondary | ICD-10-CM | POA: Diagnosis not present

## 2023-12-31 DIAGNOSIS — F17210 Nicotine dependence, cigarettes, uncomplicated: Secondary | ICD-10-CM | POA: Diagnosis not present

## 2023-12-31 DIAGNOSIS — E785 Hyperlipidemia, unspecified: Secondary | ICD-10-CM | POA: Diagnosis not present

## 2023-12-31 DIAGNOSIS — M0500 Felty's syndrome, unspecified site: Secondary | ICD-10-CM | POA: Diagnosis not present

## 2023-12-31 DIAGNOSIS — Z9981 Dependence on supplemental oxygen: Secondary | ICD-10-CM | POA: Diagnosis not present

## 2023-12-31 DIAGNOSIS — J441 Chronic obstructive pulmonary disease with (acute) exacerbation: Secondary | ICD-10-CM | POA: Diagnosis not present

## 2023-12-31 DIAGNOSIS — J9621 Acute and chronic respiratory failure with hypoxia: Secondary | ICD-10-CM | POA: Diagnosis not present

## 2023-12-31 DIAGNOSIS — R0602 Shortness of breath: Secondary | ICD-10-CM | POA: Diagnosis not present

## 2023-12-31 DIAGNOSIS — J168 Pneumonia due to other specified infectious organisms: Secondary | ICD-10-CM | POA: Diagnosis not present

## 2023-12-31 DIAGNOSIS — G9341 Metabolic encephalopathy: Secondary | ICD-10-CM | POA: Diagnosis not present

## 2023-12-31 DIAGNOSIS — Z7984 Long term (current) use of oral hypoglycemic drugs: Secondary | ICD-10-CM | POA: Diagnosis not present

## 2023-12-31 DIAGNOSIS — J9622 Acute and chronic respiratory failure with hypercapnia: Secondary | ICD-10-CM | POA: Diagnosis not present

## 2023-12-31 DIAGNOSIS — I10 Essential (primary) hypertension: Secondary | ICD-10-CM | POA: Diagnosis not present

## 2023-12-31 DIAGNOSIS — I2510 Atherosclerotic heart disease of native coronary artery without angina pectoris: Secondary | ICD-10-CM | POA: Diagnosis not present

## 2023-12-31 DIAGNOSIS — I110 Hypertensive heart disease with heart failure: Secondary | ICD-10-CM | POA: Diagnosis not present

## 2023-12-31 DIAGNOSIS — I6782 Cerebral ischemia: Secondary | ICD-10-CM | POA: Diagnosis not present

## 2023-12-31 DIAGNOSIS — R9431 Abnormal electrocardiogram [ECG] [EKG]: Secondary | ICD-10-CM | POA: Diagnosis not present

## 2023-12-31 DIAGNOSIS — Z87891 Personal history of nicotine dependence: Secondary | ICD-10-CM | POA: Diagnosis not present

## 2023-12-31 DIAGNOSIS — R4182 Altered mental status, unspecified: Secondary | ICD-10-CM | POA: Diagnosis not present

## 2023-12-31 DIAGNOSIS — Z79899 Other long term (current) drug therapy: Secondary | ICD-10-CM | POA: Diagnosis not present

## 2023-12-31 DIAGNOSIS — J440 Chronic obstructive pulmonary disease with acute lower respiratory infection: Secondary | ICD-10-CM | POA: Diagnosis not present

## 2023-12-31 DIAGNOSIS — I5043 Acute on chronic combined systolic (congestive) and diastolic (congestive) heart failure: Secondary | ICD-10-CM | POA: Diagnosis not present

## 2023-12-31 DIAGNOSIS — Z7983 Long term (current) use of bisphosphonates: Secondary | ICD-10-CM | POA: Diagnosis not present

## 2023-12-31 LAB — CBC
HCT: 51.6 % (ref 39.0–52.0)
Hemoglobin: 15 g/dL (ref 13.0–17.0)
MCH: 31.7 pg (ref 26.0–34.0)
MCHC: 29.1 g/dL — ABNORMAL LOW (ref 30.0–36.0)
MCV: 109.1 fL — ABNORMAL HIGH (ref 80.0–100.0)
Platelets: 285 K/uL (ref 150–400)
RBC: 4.73 MIL/uL (ref 4.22–5.81)
RDW: 16.5 % — ABNORMAL HIGH (ref 11.5–15.5)
WBC: 20.6 K/uL — ABNORMAL HIGH (ref 4.0–10.5)
nRBC: 0.1 % (ref 0.0–0.2)

## 2023-12-31 LAB — I-STAT VENOUS BLOOD GAS, ED
Acid-Base Excess: 9 mmol/L — ABNORMAL HIGH (ref 0.0–2.0)
Bicarbonate: 40.3 mmol/L — ABNORMAL HIGH (ref 20.0–28.0)
Calcium, Ion: 1.23 mmol/L (ref 1.15–1.40)
HCT: 45 % (ref 39.0–52.0)
Hemoglobin: 15.3 g/dL (ref 13.0–17.0)
O2 Saturation: 61 %
Potassium: 4.7 mmol/L (ref 3.5–5.1)
Sodium: 144 mmol/L (ref 135–145)
TCO2: 43 mmol/L — ABNORMAL HIGH (ref 22–32)
pCO2, Ven: 86.4 mmHg (ref 44–60)
pH, Ven: 7.276 (ref 7.25–7.43)
pO2, Ven: 38 mmHg (ref 32–45)

## 2023-12-31 LAB — TROPONIN I (HIGH SENSITIVITY)
Troponin I (High Sensitivity): 10 ng/L (ref <18)
Troponin I (High Sensitivity): 10 ng/L (ref <18)

## 2023-12-31 LAB — I-STAT ARTERIAL BLOOD GAS, ED
Acid-Base Excess: 9 mmol/L — ABNORMAL HIGH (ref 0.0–2.0)
Bicarbonate: 40.8 mmol/L — ABNORMAL HIGH (ref 20.0–28.0)
Calcium, Ion: 1.3 mmol/L (ref 1.15–1.40)
HCT: 46 % (ref 39.0–52.0)
Hemoglobin: 15.6 g/dL (ref 13.0–17.0)
O2 Saturation: 96 %
Potassium: 4.3 mmol/L (ref 3.5–5.1)
Sodium: 139 mmol/L (ref 135–145)
TCO2: 44 mmol/L — ABNORMAL HIGH (ref 22–32)
pCO2 arterial: 90.6 mmHg (ref 32–48)
pH, Arterial: 7.261 — ABNORMAL LOW (ref 7.35–7.45)
pO2, Arterial: 97 mmHg (ref 83–108)

## 2023-12-31 LAB — COMPREHENSIVE METABOLIC PANEL WITH GFR
ALT: 29 U/L (ref 0–44)
AST: 27 U/L (ref 15–41)
Albumin: 2.7 g/dL — ABNORMAL LOW (ref 3.5–5.0)
Alkaline Phosphatase: 127 U/L — ABNORMAL HIGH (ref 38–126)
Anion gap: 15 (ref 5–15)
BUN: 13 mg/dL (ref 8–23)
CO2: 29 mmol/L (ref 22–32)
Calcium: 9.1 mg/dL (ref 8.9–10.3)
Chloride: 98 mmol/L (ref 98–111)
Creatinine, Ser: 0.81 mg/dL (ref 0.61–1.24)
GFR, Estimated: >60 mL/min (ref >60)
Glucose, Bld: 103 mg/dL — ABNORMAL HIGH (ref 70–99)
Potassium: 4.8 mmol/L (ref 3.5–5.1)
Sodium: 142 mmol/L (ref 135–145)
Total Bilirubin: 0.5 mg/dL (ref 0.0–1.2)
Total Protein: 7.1 g/dL (ref 6.5–8.1)

## 2023-12-31 LAB — ECHOCARDIOGRAM COMPLETE
Area-P 1/2: 4.1 cm2
Height: 63 in
S' Lateral: 3.5 cm
Weight: 3336.88 [oz_av]

## 2023-12-31 LAB — PROTIME-INR
INR: 1 (ref 0.8–1.2)
Prothrombin Time: 13.3 s (ref 11.4–15.2)

## 2023-12-31 LAB — CBG MONITORING, ED
Glucose-Capillary: 107 mg/dL — ABNORMAL HIGH (ref 70–99)
Glucose-Capillary: 120 mg/dL — ABNORMAL HIGH (ref 70–99)
Glucose-Capillary: 158 mg/dL — ABNORMAL HIGH (ref 70–99)
Glucose-Capillary: 90 mg/dL (ref 70–99)

## 2023-12-31 LAB — GLUCOSE, CAPILLARY
Glucose-Capillary: 113 mg/dL — ABNORMAL HIGH (ref 70–99)
Glucose-Capillary: 94 mg/dL (ref 70–99)

## 2023-12-31 LAB — HEMOGLOBIN A1C
Hgb A1c MFr Bld: 6.3 % — ABNORMAL HIGH (ref 4.8–5.6)
Mean Plasma Glucose: 134.11 mg/dL

## 2023-12-31 LAB — LIPID PANEL
Cholesterol: 114 mg/dL (ref 0–200)
HDL: 35 mg/dL — ABNORMAL LOW (ref >40)
LDL Cholesterol: 65 mg/dL (ref 0–99)
Total CHOL/HDL Ratio: 3.3 ratio
Triglycerides: 72 mg/dL (ref <150)
VLDL: 14 mg/dL (ref 0–40)

## 2023-12-31 LAB — APTT: aPTT: 27 s (ref 24–36)

## 2023-12-31 LAB — AMMONIA: Ammonia: 47 umol/L — ABNORMAL HIGH (ref 9–35)

## 2023-12-31 LAB — TSH: TSH: 0.441 u[IU]/mL (ref 0.350–4.500)

## 2023-12-31 LAB — HIV ANTIBODY (ROUTINE TESTING W REFLEX): HIV Screen 4th Generation wRfx: NONREACTIVE

## 2023-12-31 MED ORDER — IPRATROPIUM-ALBUTEROL 0.5-2.5 (3) MG/3ML IN SOLN
3.0000 mL | RESPIRATORY_TRACT | Status: DC
  Administered 2023-12-31 (×2): 3 mL via RESPIRATORY_TRACT
  Filled 2023-12-31 (×2): qty 3

## 2023-12-31 MED ORDER — IPRATROPIUM-ALBUTEROL 0.5-2.5 (3) MG/3ML IN SOLN: 3.0000 mL | RESPIRATORY_TRACT | Status: DC | PRN

## 2023-12-31 MED ORDER — ASPIRIN 300 MG RE SUPP
300.0000 mg | Freq: Every day | RECTAL | Status: DC
  Administered 2023-12-31: 300 mg via RECTAL
  Filled 2023-12-31: qty 1

## 2023-12-31 MED ORDER — PERFLUTREN LIPID MICROSPHERE
1.0000 mL | INTRAVENOUS | Status: AC | PRN
  Administered 2023-12-31: 2 mL via INTRAVENOUS

## 2023-12-31 MED ORDER — SODIUM CHLORIDE 0.9 % IV SOLN
2.0000 g | INTRAVENOUS | Status: DC
  Administered 2023-12-31 – 2024-01-03 (×4): 2 g via INTRAVENOUS
  Filled 2023-12-31 (×4): qty 20

## 2023-12-31 MED ORDER — ASPIRIN 325 MG PO TABS: 325.0000 mg | ORAL_TABLET | Freq: Every day | ORAL | Status: DC

## 2023-12-31 MED ORDER — ENOXAPARIN SODIUM 60 MG/0.6ML IJ SOSY
45.0000 mg | PREFILLED_SYRINGE | INTRAMUSCULAR | Status: DC
  Administered 2023-12-31 – 2024-01-03 (×4): 45 mg via SUBCUTANEOUS
  Filled 2023-12-31 (×4): qty 0.6

## 2023-12-31 MED ORDER — LORAZEPAM 0.5 MG PO TABS: 0.5000 mg | ORAL_TABLET | Freq: Once | ORAL | Status: DC | PRN

## 2023-12-31 MED ORDER — STROKE: EARLY STAGES OF RECOVERY BOOK
Freq: Once | Status: AC
  Filled 2023-12-31: qty 1

## 2023-12-31 MED ORDER — ACETAMINOPHEN 160 MG/5ML PO SOLN: 650.0000 mg | ORAL | Status: DC | PRN

## 2023-12-31 MED ORDER — BUDESONIDE 0.5 MG/2ML IN SUSP
0.5000 mg | Freq: Two times a day (BID) | RESPIRATORY_TRACT | Status: DC
  Administered 2023-12-31 – 2024-01-04 (×9): 0.5 mg via RESPIRATORY_TRACT
  Filled 2023-12-31 (×9): qty 2

## 2023-12-31 MED ORDER — REVEFENACIN 175 MCG/3ML IN SOLN
175.0000 ug | Freq: Every day | RESPIRATORY_TRACT | Status: DC
  Administered 2023-12-31 – 2024-01-04 (×5): 175 ug via RESPIRATORY_TRACT
  Filled 2023-12-31 (×5): qty 3

## 2023-12-31 MED ORDER — METHYLPREDNISOLONE SODIUM SUCC 125 MG IJ SOLR
125.0000 mg | Freq: Once | INTRAMUSCULAR | Status: AC
  Administered 2023-12-31: 125 mg via INTRAVENOUS
  Filled 2023-12-31: qty 2

## 2023-12-31 MED ORDER — ARFORMOTEROL TARTRATE 15 MCG/2ML IN NEBU
15.0000 ug | INHALATION_SOLUTION | Freq: Two times a day (BID) | RESPIRATORY_TRACT | Status: DC
  Administered 2023-12-31 – 2024-01-04 (×9): 15 ug via RESPIRATORY_TRACT
  Filled 2023-12-31 (×8): qty 2

## 2023-12-31 MED ORDER — ALBUTEROL SULFATE (2.5 MG/3ML) 0.083% IN NEBU
2.5000 mg | INHALATION_SOLUTION | RESPIRATORY_TRACT | Status: DC | PRN
  Administered 2023-12-31: 2.5 mg via RESPIRATORY_TRACT

## 2023-12-31 MED ORDER — INSULIN ASPART 100 UNIT/ML IJ SOLN
0.0000 [IU] | Freq: Three times a day (TID) | INTRAMUSCULAR | Status: DC
  Administered 2023-12-31: 1 [IU] via SUBCUTANEOUS

## 2023-12-31 MED ORDER — AZITHROMYCIN 500 MG IV SOLR
500.0000 mg | INTRAVENOUS | Status: DC
  Administered 2024-01-01 – 2024-01-02 (×2): 500 mg via INTRAVENOUS
  Filled 2023-12-31 (×2): qty 5

## 2023-12-31 MED ORDER — LORAZEPAM 2 MG/ML IJ SOLN
0.5000 mg | Freq: Once | INTRAMUSCULAR | Status: AC | PRN
  Administered 2023-12-31: 0.5 mg via INTRAVENOUS
  Filled 2023-12-31: qty 1

## 2023-12-31 MED ORDER — SODIUM CHLORIDE 0.9 % IV SOLN: INTRAVENOUS | Status: DC

## 2023-12-31 MED ORDER — ACETAMINOPHEN 650 MG RE SUPP: 650.0000 mg | RECTAL | Status: DC | PRN

## 2023-12-31 MED ORDER — ACETAMINOPHEN 325 MG PO TABS: 650.0000 mg | ORAL_TABLET | ORAL | Status: DC | PRN

## 2023-12-31 NOTE — ED Notes (Signed)
 Vbg results to dr.dixon md

## 2023-12-31 NOTE — Progress Notes (Signed)
 Transported patient on Bipap from room 34 to room 4 with no problems.

## 2023-12-31 NOTE — Evaluation (Signed)
 Occupational Therapy Evaluation Patient Details Name: Nathan Nicholson MRN: [id number] DOB: [birth year] Today's Date: 12/31/2023   History of Present Illness   68 y.o. M presenting initially to the ED on 10/26 for increased lethargy and slurred speech.  CT head without acute findings. PMH of COPD, CAD s/p PCI, HTN, rheumatoid arthritis     Clinical Impressions Pt resting in bed, SOB, c/o no pain, wife/daughter present during session. Pt lives with wife who works during the day, PLOF wife helps with dressing/bathing, Pt ambulatory short distances with cane, not able to cook/clean, drives to friends house while wife is at work. Pt currently limited due to SOB and desaturation with activity, down to 83% with light activity, transferring to chair and back, quickly recovers with rest breaks. Pt would benefit from continued acute OT to maximize activity tolerance, recommending HHOT follow up, Pt reports he feels like he could get around at home ambulating short distances with RW.      If plan is discharge home, recommend the following:   A little help with walking and/or transfers;A little help with bathing/dressing/bathroom;Assistance with cooking/housework;Assist for transportation;Help with stairs or ramp for entrance     Functional Status Assessment   Patient has had a recent decline in their functional status and demonstrates the ability to make significant improvements in function in a reasonable and predictable amount of time.     Equipment Recommendations   None recommended by OT     Recommendations for Other Services         Precautions/Restrictions   Precautions Precautions: Fall Recall of Precautions/Restrictions: Intact Restrictions Weight Bearing Restrictions Per Provider Order: No     Mobility Bed Mobility Overal bed mobility: Needs Assistance Bed Mobility: Supine to Sit, Sit to Supine     Supine to sit: Min assist, Mod assist, HOB elevated, Used  rails Sit to supine: Min assist, Mod assist   General bed mobility comments: min/mod, increased time/effort for sitting up and turning off edge of stretcher, difficult for Pt to get back due to stretcher being high up.    Transfers Overall transfer level: Needs assistance Equipment used: Rolling walker (2 wheels) Transfers: Sit to/from Stand, Bed to chair/wheelchair/BSC Sit to Stand: Contact guard assist     Step pivot transfers: Contact guard assist     General transfer comment: CGA for STS and taking steps at bedside      Balance Overall balance assessment: Mild deficits observed, not formally tested                                         ADL either performed or assessed with clinical judgement   ADL Overall ADL's : Needs assistance/impaired Eating/Feeding: Independent   Grooming: Set up;Sitting;Standing;Supervision/safety   Upper Body Bathing: Set up;Sitting   Lower Body Bathing: Moderate assistance;Sitting/lateral leans;Sit to/from stand   Upper Body Dressing : Set up;Sitting   Lower Body Dressing: Maximal assistance;Sitting/lateral leans;Sit to/from stand   Toilet Transfer: Contact guard assist;Rolling walker (2 wheels)   Toileting- Clothing Manipulation and Hygiene: Set up;Contact guard assist;Sitting/lateral lean;Sit to/from stand       Functional mobility during ADLs: Contact guard assist;Minimal assistance;Rolling walker (2 wheels) General ADL Comments: CGA for transfers with RW, needs mod/max A for LB ADLs at baseline. Pt has fair overall BUE strength/function to complete UB ADLs.     Vision  Perception         Praxis         Pertinent Vitals/Pain Pain Assessment Pain Assessment: No/denies pain     Extremity/Trunk Assessment Upper Extremity Assessment Upper Extremity Assessment: Overall WFL for tasks assessed   Lower Extremity Assessment Lower Extremity Assessment: Defer to PT evaluation       Communication      Cognition Arousal: Alert Behavior During Therapy: WFL for tasks assessed/performed Cognition: Cognition impaired   Orientation impairments: Situation         OT - Cognition Comments: A/O x3, not aware of situation, does not remember coming to ER                         Cueing  General Comments          Exercises     Shoulder Instructions      Home Living Family/patient expects to be discharged to:: Private residence Living Arrangements: Spouse/significant other Available Help at Discharge: Family;Available PRN/intermittently Type of Home: Mobile home Home Access: Ramped entrance     Home Layout: One level     Bathroom Shower/Tub: Walk-in shower;Tub/shower unit         Home Equipment: Shower seat;Cane - single Librarian, Academic (2 wheels);Rollator (4 wheels);Wheelchair - manual   Additional Comments: Pt lives with wife who works during the day, goes to friends house during the day driving himself.      Prior Functioning/Environment Prior Level of Function : Needs assist             Mobility Comments: uses cane, no falls ADLs Comments: Pt needs help with dressing/bathing from wife, able to self feed, not able to make meals but is able to drive to his friends house.    OT Problem List: Decreased strength;Decreased activity tolerance;Impaired balance (sitting and/or standing);Decreased safety awareness;Cardiopulmonary status limiting activity   OT Treatment/Interventions: Self-care/ADL training;Therapeutic exercise;Energy conservation;DME and/or AE instruction;Therapeutic activities;Patient/family education      OT Goals(Current goals can be found in the care plan section)   Acute Rehab OT Goals Patient Stated Goal: to improve activity tolerance OT Goal Formulation: With patient/family Time For Goal Achievement: 01/14/24 Potential to Achieve Goals: Good   OT Frequency:  Min 2X/week    Co-evaluation              AM-PAC OT  6 Clicks Daily Activity     Outcome Measure Help from another person eating meals?: None Help from another person taking care of personal grooming?: A Little Help from another person toileting, which includes using toliet, bedpan, or urinal?: A Little Help from another person bathing (including washing, rinsing, drying)?: A Lot Help from another person to put on and taking off regular upper body clothing?: A Little Help from another person to put on and taking off regular lower body clothing?: A Lot 6 Click Score: 17   End of Session Equipment Utilized During Treatment: Gait belt;Rolling walker (2 wheels) Nurse Communication: Mobility status  Activity Tolerance: Patient tolerated treatment well Patient left: in bed;with call bell/phone within reach;with family/visitor present  OT Visit Diagnosis: Unsteadiness on feet (R26.81);Other abnormalities of gait and mobility (R26.89);Muscle weakness (generalized) (M62.81)                Time: 8463-8397 OT Time Calculation (min): 26 min Charges:  OT General Charges $OT Visit: 1 Visit OT Evaluation $OT Eval Low Complexity: 1 Low OT Treatments $Self Care/Home Management : 8-22 mins  [address], OTR/L   Elouise JONELLE Bott 12/31/2023, 4:12 PM

## 2023-12-31 NOTE — Progress Notes (Signed)
 PROGRESS NOTE Nathan Nicholson  FMW:[id number] DOB: 1955/09/21 DOA: 12/30/2023 PCP: Pandora Therisa RAMAN, NP  Brief Narrative/Hospital Course: Nathan Nicholson is a 68 y.o. male with PMH of f COPD, CAD status post PCI in 2023, hypertension, rheumatoid arthritis was brought to the ED for increasing lethargic sleepy and also having slurred speech.at baseline is largely bedbound but able to usually hold on a conversation. In the ZI:ozuyjmhpr. CT head>>nothing acute CT angiogram of the head and neck>>no LVO. Neurology on-call was consulted.  MRI brain is pending. ABG shows pH of 7.26 pCO2 of 90.  WBC count is 19.9.  Chest x-ray shows possible infiltrates.  Given the hypercarbic respiratory failure patient is placed on BiPAP and started on IV antibiotics for pneumonia admitted for further workup of acute hypercarbic respiratory failure with acute encephalopathy.  Subjective: Seen and examined today He is tremulous but is more alert awake and he follows commands Overnight afebrile on Bipape, VSS, Labs  VBG pending-subsequently resulted pH 7.27 pCO2 86 nh3 47, TSH normal, ammonia troponin neg x 2  ldl 65, albumin 2.7, cbc similar leukocytosis 20.6 slightly up bmp normal He is on BIPAP At home on [place] oxygen  and drives at baseline  Assessment and plan:  Acute respiratory failure with hypercarbia Chronic hypoxic respiratory failure on home oxygen  Acute COPD exacerbation Possibly OSA: Presentation concerning for COPD exacerbation with hypercapnia possibly underlying OSA.  Initial pCO2 in 90s, placed on BiPAP, repeat VBG shows still hypercapnic at 86- I have consulted PCCM as CO2 remains elevated on repeat VBG although he is mentating fine, continue on BiPAP, n.p.o. for now  Continue bronchodilators.  Has diminished breath sounds bilaterally no obvious wheezing.  Community-acquired pneumonia: Cxr reviewed. Wbc up in 20k, Continue ceftriaxone /azithromycin ,follow-up blood culture urine antigens.   Acute  metabolic encephalopathy: Likely from hypercapnia, CT brain CT angio negative MRI brain pending.  TSH and ammonia unremarkable Stroke workup initiated per neurology LDL 65  CAD status post PCI in 2023: Serial troponin negative x 2.  Continue aspirin  per rectally for now.  Hypertension  Stable  Macrocytosis: Monitor hb.  History of rheumatoid arthritis per the chart.  Diabetes mellitus type 2: A1c stable, cbg stable.  Continue SSI Recent Labs  Lab 12/30/23 2143 12/31/23 0033 12/31/23 0352 12/31/23 0743  GLUCAP 105* 90  --  107*  HGBA1C  --   --  6.3*  --     Class II Obesity w/ Body mass index is 36.94 kg/m.: Will benefit with PCP follow-up, weight loss,healthy lifestyle and outpatient sleep eval if not done.  Mobility: PT Orders: Active  PT Follow up Rec:    DVT prophylaxis:  Code Status:   Code Status: Full Code Family Communication: plan of care discussed with patient/cousin at bedside. Patient status is: Remains hospitalized because of severity of illness Level of care: Progressive   Dispo: The patient is from: HOME W/ WIFE, able to walk and get ot bathroom at baseline             Anticipated disposition: TBD Objective: Vitals last 24 hrs: Vitals:   12/31/23 0530 12/31/23 0759 12/31/23 0804 12/31/23 0901  BP: (!) 115/43   113/74  Pulse: 61 71  99  Resp: 16 16  18   Temp:   (!) 97.2 F (36.2 C)   TempSrc:   Axillary   SpO2: 100% 99%  94%  Weight:      Height:        Physical Examination: General exam: alert awake, oriented,  older than stated age HEENT:Oral mucosa moist, Ear/Nose WNL grossly Respiratory system: Bilaterally diminished BS, mild use of accessory muscle Cardiovascular system: S1 & S2 +, No JVD. Gastrointestinal system: Abdomen soft,NT,ND, BS+ Nervous System: Alert, awake, moving all extremities,and following commands. Extremities: extremities warm, leg edema neg Skin: Warm, no rashes MSK: Normal muscle bulk,tone, power   Medications  reviewed:  Scheduled Meds:  [START ON 01/01/2024]  stroke: early stages of recovery book   Does not apply Once   aspirin   300 mg Rectal Daily   Or   aspirin   325 mg Oral Daily   enoxaparin  (LOVENOX ) injection  45 mg Subcutaneous Q24H   insulin  aspart  0-6 Units Subcutaneous TID WC   ipratropium-albuterol   3 mL Nebulization Q4H   Continuous Infusions:  sodium chloride  40 mL/hr at 12/31/23 0402   [START ON 01/01/2024] azithromycin      cefTRIAXone  (ROCEPHIN )  IV     Diet: Diet Order             Diet NPO time specified  Diet effective now                    Data Reviewed: I have personally reviewed following labs and imaging studies ( see epic result tab) CBC: Recent Labs  Lab 12/30/23 2152 12/31/23 0219 12/31/23 0352 12/31/23 0753  WBC 19.9*  --  20.6*  --   NEUTROABS 14.1*  --   --   --   HGB 15.4 15.6 15.0 15.3  HCT 51.1 46.0 51.6 45.0  MCV 106.7*  --  109.1*  --   PLT PLATELET CLUMPS NOTED ON SMEAR, COUNT APPEARS ADEQUATE  --  285  --    CMP: Recent Labs  Lab 12/30/23 2152 12/31/23 0219 12/31/23 0352 12/31/23 0753  NA 142 139 142 144  K 4.8 4.3 4.8 4.7  CL 96*  --  98  --   CO2 34*  --  29  --   GLUCOSE 106*  --  103*  --   BUN 13  --  13  --   CREATININE 0.98  --  0.81  --   CALCIUM  9.1  --  9.1  --    GFR: Estimated Creatinine Clearance: 90.1 mL/min (by C-G formula based on SCr of 0.81 mg/dL). Recent Labs  Lab 12/30/23 2152 12/31/23 0352  AST 29 27  ALT 30 29  ALKPHOS 129* 127*  BILITOT 0.7 0.5  PROT 7.1 7.1  ALBUMIN 2.8* 2.7*   No results for input(s): LIPASE, AMYLASE in the last 168 hours.  Recent Labs  Lab 12/31/23 0553  AMMONIA 47*   Coagulation Profile:  Recent Labs  Lab 12/30/23 2303 12/31/23 0352  INR SPECIMEN CLOTTED 1.0   Unresulted Labs (From admission, onward)     Start     Ordered   01/07/24 0500  Creatinine, serum  (enoxaparin  (LOVENOX )    CrCl >/= 30 ml/min)  Weekly,   R     Comments: while on enoxaparin   therapy    12/31/23 0315   01/01/24 0500  Basic metabolic panel with GFR  Daily,   R      12/31/23 0726   01/01/24 0500  CBC  Daily,   R      12/31/23 0726   12/31/23 0104  Blood gas, arterial  ONCE - STAT,   STAT        12/31/23 0103           Antimicrobials/Microbiology: Anti-infectives (From admission,  onward)    Start     Dose/Rate Route Frequency Ordered Stop   01/01/24 0000  azithromycin  (ZITHROMAX ) 500 mg in sodium chloride  0.9 % 250 mL IVPB        500 mg 250 mL/hr over 60 Minutes Intravenous Every 24 hours 12/31/23 0312     12/31/23 2200  cefTRIAXone  (ROCEPHIN ) 2 g in sodium chloride  0.9 % 100 mL IVPB        2 g 200 mL/hr over 30 Minutes Intravenous Every 24 hours 12/31/23 0311     12/30/23 2300  cefTRIAXone  (ROCEPHIN ) 2 g in sodium chloride  0.9 % 100 mL IVPB        2 g 200 mL/hr over 30 Minutes Intravenous  Once 12/30/23 2249 12/31/23 0037   12/30/23 2300  azithromycin  (ZITHROMAX ) 500 mg in sodium chloride  0.9 % 250 mL IVPB        500 mg 250 mL/hr over 60 Minutes Intravenous  Once 12/30/23 2249 12/31/23 0218         Component Value Date/Time   SDES BLOOD RIGHT HAND 12/30/2023 2303   SDES BLOOD LEFT ARM 12/30/2023 2303   SPECREQUEST  12/30/2023 2303    BOTTLES DRAWN AEROBIC AND ANAEROBIC Blood Culture results may not be optimal due to an inadequate volume of blood received in culture bottles   SPECREQUEST  12/30/2023 2303    BOTTLES DRAWN AEROBIC AND ANAEROBIC Blood Culture results may not be optimal due to an inadequate volume of blood received in culture bottles   CULT  12/30/2023 2303    NO GROWTH < 12 HOURS Performed at [facility], [address] [place], [place] [address]    CULT  12/30/2023 2303    NO GROWTH < 12 HOURS Performed at [facility], [address] [place], [place] [address]    REPTSTATUS PENDING 12/30/2023 2303   REPTSTATUS PENDING 12/30/2023 2303    Procedures:  Mennie LAMY, MD [facility] 12/31/2023, 11:03 AM

## 2023-12-31 NOTE — ED Notes (Signed)
Report given to Madeline RN

## 2023-12-31 NOTE — Hospital Course (Addendum)
 Nathan Nicholson is a 68 y.o. male with PMH of f COPD, CAD status post PCI in 2023, hypertension, rheumatoid arthritis was brought to the ED for increasing lethargic sleepy and also having slurred speech.at baseline is largely bedbound but able to usually hold on a conversation. In the ZI:ozuyjmhpr. CT head>>nothing acute CT angiogram of the head and neck>>no LVO. Neurology on-call was consulted.  MRI brain is pending. ABG shows pH of 7.26 pCO2 of 90.  WBC count is 19.9.  Chest x-ray shows possible infiltrates.  Given the hypercarbic respiratory failure patient is placed on BiPAP and started on IV antibiotics for pneumonia admitted for further workup of acute hypercarbic respiratory failure with acute encephalopathy.  Subjective: Seen and examined today He is tremulous but is more alert awake and he follows commands Overnight afebrile on Bipape, VSS, Labs  VBG pending-subsequently resulted pH 7.27 pCO2 86 nh3 47, TSH normal, ammonia troponin neg x 2  ldl 65, albumin 2.7, cbc similar leukocytosis 20.6 slightly up bmp normal He is on BIPAP At home on [place] oxygen  and drives at baseline  Assessment and plan:  Acute respiratory failure with hypercarbia Chronic hypoxic respiratory failure on home oxygen  Acute COPD exacerbation Possibly OSA: Presentation concerning for COPD exacerbation with hypercapnia possibly underlying OSA.  Initial pCO2 in 90s, placed on BiPAP, repeat VBG shows still hypercapnic at 86- I have consulted PCCM as CO2 remains elevated on repeat VBG although he is mentating fine, continue on BiPAP, n.p.o. for now  Continue bronchodilators.  Has diminished breath sounds bilaterally no obvious wheezing.  Community-acquired pneumonia: Cxr reviewed. Wbc up in 20k, Continue ceftriaxone /azithromycin ,follow-up blood culture urine antigens.   Acute metabolic encephalopathy: Likely from hypercapnia, CT brain CT angio negative MRI brain pending.  TSH and ammonia unremarkable Stroke workup  initiated per neurology LDL 65  CAD status post PCI in 2023: Serial troponin negative x 2.  Continue aspirin  per rectally for now.  Hypertension  Stable  Macrocytosis: Monitor hb.  History of rheumatoid arthritis per the chart.  Diabetes mellitus type 2: A1c stable, cbg stable.  Continue SSI Recent Labs  Lab 12/30/23 2143 12/31/23 0033 12/31/23 0352 12/31/23 0743  GLUCAP 105* 90  --  107*  HGBA1C  --   --  6.3*  --     Class II Obesity w/ Body mass index is 36.94 kg/m.: Will benefit with PCP follow-up, weight loss,healthy lifestyle and outpatient sleep eval if not done.  Mobility: PT Orders: Active  PT Follow up Rec:    DVT prophylaxis:  Code Status:   Code Status: Full Code Family Communication: plan of care discussed with patient/cousin at bedside. Patient status is: Remains hospitalized because of severity of illness Level of care: Progressive   Dispo: The patient is from: HOME W/ WIFE, able to walk and get ot bathroom at baseline             Anticipated disposition: TBD Objective: Vitals last 24 hrs: Vitals:   12/31/23 0530 12/31/23 0759 12/31/23 0804 12/31/23 0901  BP: (!) 115/43   113/74  Pulse: 61 71  99  Resp: 16 16  18   Temp:   (!) 97.2 F (36.2 C)   TempSrc:   Axillary   SpO2: 100% 99%  94%  Weight:      Height:        Physical Examination: General exam: alert awake, oriented, older than stated age HEENT:Oral mucosa moist, Ear/Nose WNL grossly Respiratory system: Bilaterally diminished BS, mild use of accessory muscle  Cardiovascular system: S1 & S2 +, No JVD. Gastrointestinal system: Abdomen soft,NT,ND, BS+ Nervous System: Alert, awake, moving all extremities,and following commands. Extremities: extremities warm, leg edema neg Skin: Warm, no rashes MSK: Normal muscle bulk,tone, power   Medications reviewed:  Scheduled Meds:  [START ON 01/01/2024]  stroke: early stages of recovery book   Does not apply Once   aspirin   300 mg Rectal  Daily   Or   aspirin   325 mg Oral Daily   enoxaparin  (LOVENOX ) injection  45 mg Subcutaneous Q24H   insulin  aspart  0-6 Units Subcutaneous TID WC   ipratropium-albuterol   3 mL Nebulization Q4H   Continuous Infusions:  sodium chloride  40 mL/hr at 12/31/23 0402   [START ON 01/01/2024] azithromycin      cefTRIAXone  (ROCEPHIN )  IV     Diet: Diet Order             Diet NPO time specified  Diet effective now

## 2023-12-31 NOTE — ED Notes (Signed)
 Notified provider of VBG result.

## 2023-12-31 NOTE — Evaluation (Signed)
 Physical Therapy Evaluation Patient Details Name: Nathan Nicholson MRN: [id number] DOB: Mar 28, 1955 Today's Date: 12/31/2023  History of Present Illness  68 y.o. M presenting initially to the ED on 10/26 for increased lethargy and slurred speech.  CT head without acute findings and MRI pending. Pt with acute respiratory failure with hypercarbia and placed on bipap. Pt with PNA and metabolic encephalopathy. PMH of COPD, CAD s/p PCI, HTN, rheumatoid arthritis  Clinical Impression  Pt admitted with above diagnosis and presents to PT with functional limitations due to deficits listed below (See PT problem list). Pt needs skilled PT to maximize independence and safety. Pt lives with wife who works during the day. Currently he is primarily limited by respiratory status. Hopeful that as respiratory status improve mobility and activity tolerance will improve.           If plan is discharge home, recommend the following: A little help with walking and/or transfers;A little help with bathing/dressing/bathroom;Assistance with cooking/housework;Assist for transportation;Help with stairs or ramp for entrance   Can travel by private vehicle        Equipment Recommendations None recommended by PT  Recommendations for Other Services       Functional Status Assessment Patient has had a recent decline in their functional status and demonstrates the ability to make significant improvements in function in a reasonable and predictable amount of time.     Precautions / Restrictions Precautions Precautions: Fall Recall of Precautions/Restrictions: Intact Restrictions Weight Bearing Restrictions Per Provider Order: No      Mobility  Bed Mobility Overal bed mobility: Needs Assistance Bed Mobility: Supine to Sit     Supine to sit: Mod assist, HOB elevated, Used rails     General bed mobility comments: Assist to elevate trunk into sitting and bring hips to EOB    Transfers Overall transfer  level: Needs assistance Equipment used: Rollator (4 wheels) Transfers: Sit to/from Stand, Bed to chair/wheelchair/BSC Sit to Stand: Contact guard assist   Step pivot transfers: Contact guard assist       General transfer comment: Assist for safety and lines    Ambulation/Gait Ambulation/Gait assistance: Contact guard assist, +2 safety/equipment Gait Distance (Feet): 25 Feet Assistive device: Rollator (4 wheels) Gait Pattern/deviations: Step-through pattern, Decreased step length - right, Decreased step length - left, Shuffle, Trunk flexed Gait velocity: decr Gait velocity interpretation: <1.31 ft/sec, indicative of household ambulator   General Gait Details: Assist for safety and [employer]     Tilt Bed    Modified Rankin (Stroke Patients Only)       Balance Overall balance assessment: Needs assistance Sitting-balance support: No upper extremity supported, Feet supported Sitting balance-Leahy Scale: Fair     Standing balance support: No upper extremity supported, During functional activity, Single extremity supported, Bilateral upper extremity supported Standing balance-Leahy Scale: Poor Standing balance comment: Preferred UE support                             Pertinent Vitals/Pain Pain Assessment Pain Assessment: No/denies pain    Home Living Family/patient expects to be discharged to:: Private residence Living Arrangements: Spouse/significant other Available Help at Discharge: Family;Available PRN/intermittently Type of Home: Mobile home Home Access: Ramped entrance       Home Layout: One level Home Equipment: Shower seat;Cane - single point;Rolling Walker (2 wheels);Rollator (4 wheels);Wheelchair - manual Additional Comments: Pt lives  with wife who works during the day, goes to friends house during the day driving himself.    Prior Function Prior Level of Function : Needs assist              Mobility Comments: uses cane, no falls ADLs Comments: Pt needs help with dressing/bathing from wife, able to self feed, not able to make meals but is able to drive to his friends house.     Extremity/Trunk Assessment   Upper Extremity Assessment Upper Extremity Assessment: Defer to OT evaluation    Lower Extremity Assessment Lower Extremity Assessment: Generalized weakness       Communication   Communication Communication: No apparent difficulties    Cognition Arousal: Alert Behavior During Therapy: WFL for tasks assessed/performed, Anxious   PT - Cognitive impairments: No apparent impairments                         Following commands: Intact       Cueing Cueing Techniques: Verbal cues     General Comments General comments (skin integrity, edema, etc.): Pt on 4L O2 at rest with SpO2 at 94%. Amb with SpO2 decr to 78% on 4L. Incr to 6L. Returned to 90%  with 4 minute rest and returned O2 to 4L with pt resting in recliner    Exercises     Assessment/Plan    PT Assessment Patient needs continued PT services  PT Problem List Decreased strength;Decreased activity tolerance;Decreased balance;Decreased mobility;Cardiopulmonary status limiting activity       PT Treatment Interventions DME instruction;Gait training;Functional mobility training;Therapeutic activities;Therapeutic exercise;Balance training;Patient/family education    PT Goals (Current goals can be found in the Care Plan section)  Acute Rehab PT Goals Patient Stated Goal: return home PT Goal Formulation: With patient Time For Goal Achievement: 01/14/24 Potential to Achieve Goals: Good    Frequency Min 2X/week     Co-evaluation               AM-PAC PT 6 Clicks Mobility  Outcome Measure Help needed turning from your back to your side while in a flat bed without using bedrails?: A Lot Help needed moving from lying on your back to sitting on the side of a flat bed without using  bedrails?: A Lot Help needed moving to and from a bed to a chair (including a wheelchair)?: A Little Help needed standing up from a chair using your arms (e.g., wheelchair or bedside chair)?: A Little Help needed to walk in hospital room?: A Little Help needed climbing 3-5 steps with a railing? : A Lot 6 Click Score: 15    End of Session Equipment Utilized During Treatment: Gait belt;Oxygen  Activity Tolerance: Patient limited by fatigue;Other (comment) (Dyspnea) Patient left: in chair;with call bell/phone within reach;with family/visitor present Nurse Communication: Mobility status PT Visit Diagnosis: Other abnormalities of gait and mobility (R26.89);Muscle weakness (generalized) (M62.81);Difficulty in walking, not elsewhere classified (R26.2)    Time: 8295-8263 PT Time Calculation (min) (ACUTE ONLY): 32 min   Charges:   PT Evaluation $PT Eval Moderate Complexity: 1 Mod PT Treatments $Gait Training: 8-22 mins PT General Charges $$ ACUTE PT VISIT: 1 Visit         [facility] PT Acute Rehabilitation Services Office [phone]   [facility] ORN [facility] 12/31/2023, 7:07 PM

## 2023-12-31 NOTE — ED Notes (Signed)
 Spoke with pt's daughter Harlene on the phone. Update given with pt's permission.

## 2023-12-31 NOTE — Plan of Care (Signed)
 MRI remains pending.  Will follow imaging.  Eligio Lav, MD Neurology

## 2023-12-31 NOTE — ED Notes (Signed)
 Family at bedside.

## 2023-12-31 NOTE — Consult Note (Addendum)
 Primary MD : Franky Haley ([facility])  Requesting MD : Franky Haley ([facility])  Reason for consult : Hypercarbia, respiratory evaluation   History : Nathan Nicholson 68 y.o. M with hx of COPD, CAD s/p PCI, HTN, rheumatoid arthritis presenting initially to the ED on 10/26 for increased lethargy and slurred speech.  CT head without acute findings.  Initial ABG with pH 7.26, pCO2 90. Found to have leukocytosis and bilateral interstitial markings on CXR.  Admitted to [facility] team overnight for further workup of hypercarbic respiratory failure and encephalopathy, placed on BiPAP and started on IV antibiotics for pneumonia.  Repeat blood gas this morning VBG with pH 7.27, pCO2 86. PCCM consulted for continued hypercarbia and respiratory evaluation.  Per family, patient without recent acute sick symptoms, no increase or change in sputum.  No known sick contacts.  Patient was at baseline on Saturday, then woke up with sleepiness, slurred speech, and episode of involuntary urination on Sunday, was then brought to the ED.  Baseline on 2 L of oxygen  at home.  No new medications recently, has not taken Percocet since back surgery months ago.  No known history of OSA.   Physical Exam Vitals:   12/31/23 0804 12/31/23 0901  BP:  113/74  Pulse:  99  Resp:  18  Temp: (!) 97.2 F (36.2 C)   SpO2:  94%   General: No acute distress.  CV: S1/S2.  Pulm: Bipap in place. Expiratory wheezing bilaterally. Equal air movement bilaterally. No respiratory distress.  Skin:  Warm, dry. Neuro: Alert and interactive. CN grossly intact. Moves extremities equally.    Labs VBG: pH 7.276, pCO2 86.4, Bicarb 40 WBC: 19.9 > 20.6 Cr: 0.81   Imaging CXR 10/27: Increased central interstitial markings bilaterally and increased interstitial opacities in the left lung base. CT Head 10/27: No acute process  Assessment: Nathan Nicholson 68 y.o. hx of COPD, CAD s/p PCI, HTN, rheumatoid arthritis  admitted for hypercarbic respiratory failure and acute encephalopathy with CT head negative for stroke.  Given blood gas findings and known COPD, suspect patient with chronic hypercarbia secondary to obstructive lung disease.  On exam, patient with mild wheezing throughout, not obtunded, overall breathing comfortably on BiPAP.  VBG with improving pH and CO2 compared to ABG on arrival when accounting for venous vs arterial sample.  At this time, will continue treatment for CAP.  PLAN : - Triple therapy nebulizers (Pulmicort , Brovana, Yupelri) - Albuterol  nebs as needed - Supplemental O2 with BiPAP as needed - Consider speech and swallow eval - Continue ceftriaxone , azithromycin  for CAP - Consider sleep study outpatient - Patient remains stable for hospital level care

## 2024-01-01 ENCOUNTER — Encounter (HOSPITAL_COMMUNITY): Payer: Self-pay | Admitting: Internal Medicine

## 2024-01-01 ENCOUNTER — Inpatient Hospital Stay (HOSPITAL_COMMUNITY)

## 2024-01-01 DIAGNOSIS — E785 Hyperlipidemia, unspecified: Secondary | ICD-10-CM | POA: Diagnosis not present

## 2024-01-01 DIAGNOSIS — Z87891 Personal history of nicotine dependence: Secondary | ICD-10-CM

## 2024-01-01 DIAGNOSIS — R4182 Altered mental status, unspecified: Secondary | ICD-10-CM | POA: Diagnosis not present

## 2024-01-01 DIAGNOSIS — I10 Essential (primary) hypertension: Secondary | ICD-10-CM | POA: Diagnosis not present

## 2024-01-01 DIAGNOSIS — R001 Bradycardia, unspecified: Secondary | ICD-10-CM | POA: Diagnosis not present

## 2024-01-01 DIAGNOSIS — R569 Unspecified convulsions: Secondary | ICD-10-CM | POA: Diagnosis not present

## 2024-01-01 DIAGNOSIS — J449 Chronic obstructive pulmonary disease, unspecified: Secondary | ICD-10-CM

## 2024-01-01 DIAGNOSIS — J9602 Acute respiratory failure with hypercapnia: Secondary | ICD-10-CM | POA: Diagnosis not present

## 2024-01-01 DIAGNOSIS — J9601 Acute respiratory failure with hypoxia: Secondary | ICD-10-CM | POA: Diagnosis not present

## 2024-01-01 DIAGNOSIS — R918 Other nonspecific abnormal finding of lung field: Secondary | ICD-10-CM | POA: Diagnosis not present

## 2024-01-01 DIAGNOSIS — R0602 Shortness of breath: Secondary | ICD-10-CM | POA: Diagnosis not present

## 2024-01-01 LAB — MRSA NEXT GEN BY PCR, NASAL: MRSA by PCR Next Gen: NOT DETECTED

## 2024-01-01 LAB — CBC
HCT: 38.6 % — ABNORMAL LOW (ref 39.0–52.0)
Hemoglobin: 12.3 g/dL — ABNORMAL LOW (ref 13.0–17.0)
MCH: 31.9 pg (ref 26.0–34.0)
MCHC: 31.9 g/dL (ref 30.0–36.0)
MCV: 100 fL (ref 80.0–100.0)
Platelets: 214 K/uL (ref 150–400)
RBC: 3.86 MIL/uL — ABNORMAL LOW (ref 4.22–5.81)
RDW: 15.9 % — ABNORMAL HIGH (ref 11.5–15.5)
WBC: 21.2 K/uL — ABNORMAL HIGH (ref 4.0–10.5)
nRBC: 0.1 % (ref 0.0–0.2)

## 2024-01-01 LAB — BASIC METABOLIC PANEL WITH GFR
Anion gap: 9 (ref 5–15)
BUN: 22 mg/dL (ref 8–23)
CO2: 33 mmol/L — ABNORMAL HIGH (ref 22–32)
Calcium: 8.5 mg/dL — ABNORMAL LOW (ref 8.9–10.3)
Chloride: 97 mmol/L — ABNORMAL LOW (ref 98–111)
Creatinine, Ser: 0.81 mg/dL (ref 0.61–1.24)
GFR, Estimated: >60 mL/min (ref >60)
Glucose, Bld: 111 mg/dL — ABNORMAL HIGH (ref 70–99)
Potassium: 3.7 mmol/L (ref 3.5–5.1)
Sodium: 139 mmol/L (ref 135–145)

## 2024-01-01 LAB — BLOOD GAS, VENOUS
Acid-Base Excess: 15.5 mmol/L — ABNORMAL HIGH (ref 0.0–2.0)
Bicarbonate: 42.8 mmol/L — ABNORMAL HIGH (ref 20.0–28.0)
O2 Saturation: 70 %
Patient temperature: 37
pCO2, Ven: 63 mmHg — ABNORMAL HIGH (ref 44–60)
pH, Ven: 7.44 — ABNORMAL HIGH (ref 7.25–7.43)
pO2, Ven: 37 mmHg (ref 32–45)

## 2024-01-01 LAB — GLUCOSE, CAPILLARY
Glucose-Capillary: 92 mg/dL (ref 70–99)
Glucose-Capillary: 92 mg/dL (ref 70–99)
Glucose-Capillary: 112 mg/dL — ABNORMAL HIGH (ref 70–99)
Glucose-Capillary: 105 mg/dL — ABNORMAL HIGH (ref 70–99)
Glucose-Capillary: 104 mg/dL — ABNORMAL HIGH (ref 70–99)

## 2024-01-01 LAB — PROCALCITONIN: Procalcitonin: <0.1 ng/mL

## 2024-01-01 LAB — TSH: TSH: 0.378 u[IU]/mL (ref 0.350–4.500)

## 2024-01-01 LAB — T4, FREE: Free T4: 0.75 ng/dL (ref 0.61–1.12)

## 2024-01-01 LAB — C-REACTIVE PROTEIN: CRP: 2.8 mg/dL — ABNORMAL HIGH (ref <1.0)

## 2024-01-01 MED ORDER — FUROSEMIDE 10 MG/ML IJ SOLN
40.0000 mg | Freq: Once | INTRAMUSCULAR | Status: AC
  Administered 2024-01-01: 40 mg via INTRAVENOUS
  Filled 2024-01-01: qty 4

## 2024-01-01 MED ORDER — POTASSIUM CHLORIDE 20 MEQ PO PACK
40.0000 meq | PACK | Freq: Once | ORAL | Status: AC
  Administered 2024-01-01: 40 meq via ORAL
  Filled 2024-01-01: qty 2

## 2024-01-01 MED ORDER — GERHARDT'S BUTT CREAM
TOPICAL_CREAM | Freq: Two times a day (BID) | CUTANEOUS | Status: DC
  Administered 2024-01-03 – 2024-01-04 (×4): 1 via TOPICAL
  Filled 2024-01-01 (×2): qty 60

## 2024-01-01 MED ORDER — ASPIRIN 81 MG PO TBEC
81.0000 mg | DELAYED_RELEASE_TABLET | Freq: Every morning | ORAL | Status: DC
  Administered 2024-01-01: 81 mg via ORAL
  Filled 2024-01-01: qty 1

## 2024-01-01 MED ORDER — NITROGLYCERIN 0.4 MG SL SUBL: 0.4000 mg | SUBLINGUAL_TABLET | SUBLINGUAL | Status: DC | PRN

## 2024-01-01 MED ORDER — FOLIC ACID 1 MG PO TABS
1.0000 mg | ORAL_TABLET | Freq: Every morning | ORAL | Status: DC
  Administered 2024-01-01 – 2024-01-04 (×4): 1 mg via ORAL
  Filled 2024-01-01 (×4): qty 1

## 2024-01-01 MED ORDER — NEBIVOLOL HCL 10 MG PO TABS
10.0000 mg | ORAL_TABLET | Freq: Every day | ORAL | Status: DC
  Administered 2024-01-01: 10 mg via ORAL
  Filled 2024-01-01: qty 1

## 2024-01-01 MED ORDER — ATORVASTATIN CALCIUM 80 MG PO TABS
80.0000 mg | ORAL_TABLET | Freq: Every day | ORAL | Status: DC
  Administered 2024-01-01 – 2024-01-03 (×3): 80 mg via ORAL
  Filled 2024-01-01 (×3): qty 1

## 2024-01-01 MED ORDER — CLOPIDOGREL BISULFATE 75 MG PO TABS
75.0000 mg | ORAL_TABLET | Freq: Every day | ORAL | Status: DC
  Administered 2024-01-01 – 2024-01-04 (×4): 75 mg via ORAL
  Filled 2024-01-01 (×4): qty 1

## 2024-01-01 MED ORDER — CYANOCOBALAMIN 1000 MCG/ML IJ SOLN
1000.0000 ug | INTRAMUSCULAR | Status: DC
  Administered 2024-01-01: 1000 ug via INTRAMUSCULAR
  Filled 2024-01-01: qty 1

## 2024-01-01 NOTE — TOC Initial Note (Signed)
 Transition of Care [facility]) - Initial/Assessment Note    Patient Details  Name: Nathan Nicholson MRN: [id number] Date of Birth: 1955-12-23  Transition of Care [facility]) CM/SW Contact:    Marval Gell, RN Phone Number: 01/01/2024, 2:30 PM  Clinical Narrative:                  Spoke w patient and spouse at the bedside.  Patient has home oxygen  through [employer].  MD requested set up of home Bipap/ NIV. Contacted [employer] to start the referral .  Discussed home health needs, and referral accepted by [facility]  for PT OT RN.     Expected Discharge Plan: Home w Home Health Services Barriers to Discharge: Continued Medical Work up   Patient Goals and CMS Choice Patient states their goals for this hospitalization and ongoing recovery are:: to go home CMS Medicare.gov Compare Post Acute Care list provided to:: Patient Choice offered to / list presented to : Patient, Spouse      Expected Discharge Plan and Services   Discharge Planning Services: CM Consult   Living arrangements for the past 2 months: Single Family Home                 DME Arranged: NIV, Bipap DME Agency: [employer] Date DME Agency Contacted: 01/01/24 Time DME Agency Contacted: [id number] Representative spoke with at DME Agency: Ethan [phone] HH Arranged: PT, OT, RN HH Agency: [facility] Date [facility] Agency Contacted: 01/01/24 Time HH Agency Contacted: 1430 Representative spoke with at [facility] Agency: Amy  Prior Living Arrangements/Services Living arrangements for the past 2 months: Single Family Home Lives with:: Spouse              Current home services: DME    Activities of Daily Living   ADL Screening (condition at time of admission) Independently performs ADLs?: Yes (appropriate for developmental age) Is the patient deaf or have difficulty hearing?: No Does the patient have difficulty seeing, even when wearing glasses/contacts?: No Does the patient have difficulty concentrating, remembering, or making  decisions?: No  Permission Sought/Granted                  Emotional Assessment              Admission diagnosis:  Slurred speech [R47.81] Disorientation [R41.0] COPD with acute exacerbation (HCC) [J44.1] Pneumonia of left lower lobe due to infectious organism [J18.9] Patient Active Problem List   Diagnosis Date Noted   Acute respiratory failure with hypoxia and hypercarbia (HCC) 12/31/2023   Pneumonia of left lower lobe due to infectious organism 12/31/2023   COPD with acute exacerbation (HCC) 12/31/2023   Acute encephalopathy 12/30/2023   CAD in native artery 06/01/2023   History of heart artery stent 06/01/2023   Acute systolic CHF (congestive heart failure) (HCC) 01/01/2022   Acute on chronic respiratory failure with hypoxia and hypercapnia (HCC) 12/26/2021   NSTEMI (non-ST elevated myocardial infarction) (HCC) 12/26/2021   Ventricular fibrillation (HCC) 12/26/2021   HCAP (healthcare-associated pneumonia) 07/28/2021   Chronic respiratory failure with hypoxia (HCC) 07/28/2021   Drug induced constipation 05/08/2021   Hypokalemia 05/07/2021   Hypomagnesemia 05/07/2021   Hyponatremia 05/07/2021   Obesity (BMI 30-39.9) 05/06/2021   Unintentional weight loss 05/05/2021   AKI (acute kidney injury) 05/05/2021   DM2 (diabetes mellitus, type 2) (HCC) 05/04/2021   Leukocytosis 05/04/2021   Benign essential HTN 05/04/2021   Tobacco use 05/04/2021   Low back pain 05/04/2021   COPD (chronic obstructive pulmonary disease) (  HCC) 05/04/2021   HLD (hyperlipidemia) 05/04/2021   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 [place] [address] Phone: [phone] Fax: [phone]  [facility] - [place], [place] - [address] [facility] [address] [employer] [place]  [place] [address] Phone: [phone] Fax: [phone]     Social Drivers of Health (SDOH) Social History: SDOH Screenings   Food Insecurity: No Food Insecurity (12/31/2023)  Housing: Low Risk  (12/31/2023)  Transportation Needs: No Transportation Needs (12/31/2023)  Utilities: Not At Risk (12/31/2023)  Alcohol Screen: Low Risk  (08/10/2022)  Depression (PHQ2-9): Low Risk  (08/10/2022)  Financial Resource Strain: Low Risk  (08/10/2022)  Physical Activity: Insufficiently Active (08/10/2022)  Social Connections: Socially Isolated (12/31/2023)  Stress: No Stress Concern Present (07/27/2021)  Tobacco Use: High Risk (12/30/2023)   SDOH Interventions:     Readmission Risk Interventions     No data to display

## 2024-01-01 NOTE — Plan of Care (Signed)
 On-call neurology note  Signed out by my colleague to follow-up on MRI, which was done to evaluate for any evidence of stroke since the patient presented as a code stroke with dysarthria.  There were multiple metabolic reasons contributing to the encephalopathy, likely leading to dysarthria.  MRI is negative for stroke.  No further inpatient neurological workup at this time.  Correction of toxic metabolic derangements per primary team as you are. Inpatient neurology will be available as needed  Plan relayed to Dr. Dennise Eligio Lav, MD Neurology

## 2024-01-01 NOTE — Progress Notes (Signed)
 Pt placed on BiPap     EPAP 8   IPAP 12    2L O2 bled in Overnight Pulse oximetry study started at 22:49

## 2024-01-01 NOTE — Progress Notes (Signed)
 Patient continues to exhibit signs of hypercapnia associated with chronic respiratory failure secondary to severe COPD. Patient has OSA and CPAP has been considered and ruled out. Patient requires the use of BiPAP both at sleep and in the daytime to help with exacerbation periods. The use of the BiPAP will treat both the patients high PCO2 levels and can reduce the risk of exacerbations and future hospitalizations. The patient will need these advanced settings in conjunction with their current medication regimen. Failure to have BiPAP available for use over a 24 hour period could lead to death. Patient is able to clear airway and manage secretions.

## 2024-01-01 NOTE — Progress Notes (Signed)
 68 year old heavy ex-smoker with COPD who follows with my partner Dr. Kara in clinic. He he also has obesity and kyphosis and required a kyphoplasty recently. He presented to the ED with lethargy and slurred speech head CT negative for acute stroke. ABG showed acute on chronic respiratory acidosis 7.26/90. Labs showed leukocytosis and chest x-ray showed interstitial infiltrates predominantly on the right    Vitals:   01/01/24 0800 01/01/24 1134  BP:  (!) 144/58  Pulse: (!) 56 64  Resp: 16   Temp:  98.1 F (36.7 C)  SpO2: 92% 94%   Used BiPAP overnight. Feels much improved  MRI negative  On exam -faint scattered rhonchi, no accessory muscle use, alert oriented x 3, S1-S2 regular, no edema, no asterixis.  Repeat VBG 7.4/63 WBC remains elevated 21K, negative procalcitonin.  Impression/plan  Acute on chronic hypercarbic respiratory failure COPD without exacerbation Acute encephalopathy resolved, stroke has been ruled out, EEG negative Community-acquired pneumonia   -Complete antibiotics -Consider nocturnal NIV -for now can have 2 L oxygen  blended into NIV - Ultimately will need sleep study as outpatient to decide PAP/O2 combination He will follow-up as outpatient with his primary pulmonologist Dr. Kara  PCCM available as needed  Harden GAILS. Jude MD

## 2024-01-01 NOTE — Progress Notes (Signed)
 EEG complete, results are pending.

## 2024-01-01 NOTE — Consult Note (Addendum)
 Cardiology Consultation   Patient ID: Nathan Nicholson MRN: [id number]; DOB: 03/19/1955  Admit date: 12/30/2023 Date of Consult: 01/01/2024  PCP:  Pandora Therisa RAMAN, NP   [facility] Providers Cardiologist:  Oneil Parchment, MD      Patient Profile: Nathan Nicholson is a 68 y.o. male with a hx of tobacco abuse, hypertension, hyperlipidemia, aortic atherosclerosis, COPD, obesity, PVCs, HFimpEF, and CAD s/p PCI to LAD on 12/2021 who is being seen 01/01/2024 for the evaluation of bradycardia and pauses at the request of Lavada Stank MD.  History of Present Illness: Nathan Nicholson is a 68 year old male with prior cardiac history listed below.  The patient was previously followed by cardiology at [facility].  On 12/2021 the patient had an anterior NSTEMI.  While awaiting bed placement he went into V-fib cardiac arrest and required CPR.  ROSC was achieved within 2 minutes. He underwent emergent cardiac catheterization and received a stent to his LAD.  In that hospitalization the patient went into cardiogenic shock and required an Impella.  Over time the patient improved and was able to be discharged home.  Patient presented to the emergency department for slurred speech and right-sided weakness.  A brain MRI was done and was negative for acute stroke.  He was found to have community-acquired pneumonia.  It was suspected mental changes are due to metabolic encephalopathy.  Cardiology was consulted due to concerns of nocturnal bradycardia.  Denied any alcohol use, tobacco use, illicit substance use.  Denies any chest pain, lightheadedness, dizziness, syncope, lower extremity edema, or shortness of breath.  He is chronically on 2 L of oxygen  at home for his OSA.  Reported that he does have a significant amount of snoring but denies daytime somnolence or poor sleep quality.  Reported that he wore the CPAP last night and did not like wearing it.  On interview patient reported that he came to the emergency  department for altered mental status and speech changes.  On interview patient was alert and orientated and able to have a conversation.   Labs showed high-sensitivity troponins negative x 2, sodium of 139, potassium of 3.7, chloride of 97, elevated bicarb of 33, creatinine of 0.81, BUN of 22, hypocalcemia of 8.5, leukocytosis with a WBC count of 21.2, anemia with a hemoglobin of 12.3, and normal TSH of 0.378.  EKG showed normal sinus rhythm with a rate of 57 and inferior lateral ST elevation.  That does not meet criteria for STEMI and is present on prior EKGs.  Chest x-ray showed Increased patchy opacity in the hypoexpanded left base, possibly representing asymmetric atelectasis or consolidation.  Echo showed a normal LVEF of 55 to 60%, no RWMA, no LVH, G1 DD, and grossly normal valve function.  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 Karolynn by hematologist Dr. Cloretta   Myocardial infarction [facility])    Oxygen  dependent    2L via [facility]   Pneumonia    Rheumatoid arthritis (HCC)     Past Surgical History:  Procedure Laterality Date   FOOT SURGERY     left   HERNIA REPAIR     IR KYPHO EA ADDL LEVEL THORACIC OR LUMBAR  06/17/2021   IR KYPHO EA  ADDL LEVEL THORACIC OR LUMBAR  07/12/2021   IR KYPHO LUMBAR INC FX REDUCE BONE BX UNI/BIL CANNULATION INC/IMAGING  07/11/2021   IR KYPHO LUMBAR INC FX REDUCE BONE BX UNI/BIL CANNULATION INC/IMAGING  10/05/2021   IR KYPHO THORACIC WITH BONE BIOPSY  06/17/2021   IR KYPHO THORACIC WITH BONE BIOPSY  08/15/2023   IR RADIOLOGIST EVAL & MGMT  05/18/2021   IR RADIOLOGIST EVAL & MGMT  08/08/2023   MULTIPLE TOOTH EXTRACTIONS     ALL TEETH EXT, DENTURES   RADIOLOGY WITH ANESTHESIA N/A 06/17/2021   Procedure: KYPHOPLASTY;   Surgeon: de Macedo Rodrigues, Katyucia, MD;  Location: [facility] OR;  Service: Radiology;  Laterality: N/A;   RADIOLOGY WITH ANESTHESIA N/A 07/11/2021   Procedure: KYPHOPLASTY;  Surgeon: de Macedo Rodrigues, Katyucia, MD;  Location: [facility] OR;  Service: Radiology;  Laterality: N/A;   RADIOLOGY WITH ANESTHESIA N/A 10/05/2021   Procedure: L3 Kyphoplasty;  Surgeon: de Macedo Rodrigues, Katyucia, MD;  Location: [facility] OR;  Service: Radiology;  Laterality: N/A;   SPLENECTOMY     TOOTH EXTRACTION     TOTAL HIP ARTHROPLASTY Right 08/17/2020   Procedure: TOTAL HIP ARTHROPLASTY ANTERIOR APPROACH;  Surgeon: Ernie Cough, MD;  Location: [facility] ORS;  Service: Orthopedics;  Laterality: Right;  70 min     Home Medications:  Prior to Admission medications   Medication Sig Start Date End Date Taking? Authorizing Provider  acetaminophen  (TYLENOL ) 500 MG tablet Take 1,000 mg by mouth daily as needed for moderate pain (pain score 4-6) or mild pain (pain score 1-3).   Yes [provider]  albuterol  (VENTOLIN  HFA) 108 (90 Base) MCG/ACT inhaler Inhale 2 puffs into the lungs every 4 (four) hours as needed for wheezing or shortness of breath (Breathing). 04/15/21  Yes [provider]  alendronate (FOSAMAX) 70 MG tablet Take 70 mg by mouth once a week.   Yes [provider]  aspirin  81 MG EC tablet Take 81 mg by mouth every morning.   Yes [provider]  atorvastatin  (LIPITOR) 80 MG tablet Take 80 mg by mouth at bedtime.   Yes [provider]  Budeson-Glycopyrrol-Formoterol  (BREZTRI  AEROSPHERE) 160-9-4.8 MCG/ACT AERO Inhale 2 puffs into the lungs in the morning and at bedtime. 04/20/23  Yes Hope Almarie ORN, NP  Calcium  Carb-Cholecalciferol (CALCIUM  600+D3 PO) Take 1 tablet by mouth at bedtime.   Yes [provider]  clopidogrel (PLAVIX) 75 MG tablet Take 75 mg by mouth daily. 01/23/22  Yes [provider]  cyanocobalamin  (,VITAMIN B-12,) 1000 MCG/ML injection Inject 1,000 mcg  into the muscle every 30 (thirty) days.   Yes [provider]  empagliflozin (JARDIANCE) 10 MG TABS tablet Take 10 mg by mouth daily.   Yes [provider]  escitalopram (LEXAPRO) 10 MG tablet Take 10 mg by mouth daily.   Yes [provider]  ferrous sulfate  325 (65 FE) MG tablet Take 325 mg by mouth once a week.   Yes [provider]  folic acid  (FOLVITE ) 1 MG tablet Take 1 mg by mouth every morning.   Yes [provider]  hydrOXYzine (ATARAX) 10 MG tablet Take 10 mg by mouth 3 (three) times daily as needed for anxiety.   Yes [provider]  ipratropium-albuterol  (DUONEB) 0.5-2.5 (3) MG/3ML SOLN Take 3 mLs by nebulization every 6 (six) hours as needed. Patient taking differently: Take 3 mLs by nebulization in the morning and at bedtime. 04/20/23  Yes Hope Almarie ORN, NP  methotrexate (RHEUMATREX) 2.5 MG tablet  Take 10 mg by mouth every Tuesday. Caution:Chemotherapy. Protect from light.   Yes [provider]  nebivolol (BYSTOLIC) 10 MG tablet Take 10 mg by mouth daily. 01/23/22  Yes [provider]  nitroGLYCERIN (NITROSTAT) 0.4 MG SL tablet Place 0.4 mg under the tongue every 5 (five) minutes as needed for chest pain. Up to 3 doses. 01/23/22  Yes [provider]  OXYGEN  Inhale 2 L into the lungs continuous.   Yes [provider]  Vitamin D, Ergocalciferol, (DRISDOL) 1.25 MG (50000 UNIT) CAPS capsule Take 50,000 Units by mouth every Monday.   Yes [provider]  amoxicillin -clavulanate (AUGMENTIN ) 875-125 MG tablet Take 1 tablet by mouth 2 (two) times daily. Patient not taking: Reported on 01/01/2024 10/23/23   Hope Almarie ORN, NP  guaiFENesin  (MUCINEX ) 600 MG 12 hr tablet Take 1 tablet (600 mg total) by mouth 2 (two) times daily as needed for cough or to loosen phlegm. Patient not taking: Reported on 01/01/2024 10/23/23   Hope Almarie ORN, NP  lisinopril -hydrochlorothiazide  (ZESTORETIC ) 20-12.5  MG tablet Take 1 tablet by mouth daily. PT TO TAKE ONLY 1/2 TABLET AT HS (PER HIS REPORT) Patient not taking: Reported on 01/01/2024    [provider]  oxyCODONE -acetaminophen  (PERCOCET) 10-325 MG tablet Take 1 tablet by mouth every 8 (eight) hours as needed for pain. For only 10 days Patient not taking: Reported on 01/01/2024    [provider]  predniSONE  (DELTASONE ) 10 MG tablet Take 10 mg by mouth 2 (two) times daily. Patient not taking: Reported on 01/01/2024 11/09/23   [provider]  senna-docusate (SENOKOT-S) 8.6-50 MG tablet Take 1 tablet by mouth 2 (two) times daily as needed for moderate constipation. Patient not taking: Reported on 01/01/2024 05/13/21   Shona Terry SAILOR, DO    Scheduled Meds:  arformoterol  15 mcg Nebulization BID   aspirin  EC  81 mg Oral q morning   atorvastatin   80 mg Oral QHS   budesonide  (PULMICORT ) nebulizer solution  0.5 mg Nebulization BID   clopidogrel  75 mg Oral Daily   cyanocobalamin   1,000 mcg Intramuscular Q30 days   enoxaparin  (LOVENOX ) injection  45 mg Subcutaneous Q24H   folic acid   1 mg Oral q morning   Gerhardt's butt cream   Topical BID   insulin  aspart  0-6 Units Subcutaneous TID WC   nebivolol  10 mg Oral Daily   revefenacin  175 mcg Nebulization Daily   Continuous Infusions:  azithromycin  500 mg (01/01/24 0032)   cefTRIAXone  (ROCEPHIN )  IV Stopped (01/01/24 0818)   PRN Meds: acetaminophen  **OR** acetaminophen  (TYLENOL ) oral liquid 160 mg/5 mL **OR** acetaminophen , albuterol , nitroGLYCERIN  Allergies:   No Known Allergies  Social History:   Social History   Socioeconomic History   Marital status: Married    Spouse name: Tysen Roesler   Number of children: 2   Years of education: Not on file   Highest education level: Not on file  Occupational History   Not on file  Tobacco Use   Smoking status: Every Day    Current packs/day: 0.00    Average packs/day: 0.5 packs/day for 40.0 years (20.0 ttl pk-yrs)     Types: Cigarettes    Start date: 06/28/1981    Last attempt to quit: 06/28/2021    Years since quitting: 2.5   Smokeless tobacco: Never   Tobacco comments:    Pt smokes 3-4 cigs a day. AB, CMA 07-20-23  Vaping Use   Vaping status: Never Used  Substance and Sexual Activity   Alcohol use: Not Currently    Comment: rare   Drug use: Never   Sexual activity: Not Currently  Other Topics Concern   Not on file  Social History Narrative   Not on file   Social Drivers of Health   Financial Resource Strain: Low Risk  (08/10/2022)   Overall Financial Resource Strain (CARDIA)    Difficulty of Paying Living Expenses: Not hard at all  Food Insecurity: No Food Insecurity (12/31/2023)   Hunger Vital Sign    Worried About Running Out of Food in the Last Year: Never true    Ran Out of Food in the Last Year: Never true  Transportation Needs: No Transportation Needs (12/31/2023)   PRAPARE - Administrator, Civil Service (Medical): No    Lack of Transportation (Non-Medical): No  Physical Activity: Insufficiently Active (08/10/2022)   Exercise Vital Sign    Days of Exercise per Week: 7 days    Minutes of Exercise per Session: 10 min  Stress: No Stress Concern Present (07/27/2021)   [employer] of Occupational Health - Occupational Stress Questionnaire    Feeling of Stress : Not at all  Social Connections: Socially Isolated (12/31/2023)   Social Connection and Isolation Panel    Frequency of Communication with Friends and Family: Once a week    Frequency of Social Gatherings with Friends and Family: Once a week    Attends Religious Services: Never    Database Administrator or Organizations: No    Attends Banker Meetings: Never    Marital Status: Married  Catering Manager Violence: Not At Risk (12/31/2023)   Humiliation, Afraid, Rape, and Kick questionnaire    Fear of Current or Ex-Partner: No    Emotionally Abused: No    Physically Abused: No    Sexually  Abused: No    Family History:    Family History  Problem Relation Age of Onset   Heart attack Brother      ROS:  Please see the history of present illness.   All other ROS reviewed and negative.     Physical Exam/Data: Vitals:   01/01/24 0302 01/01/24 0734 01/01/24 0800 01/01/24 1134  BP: 112/62 (!) 112/55  (!) 144/58  Pulse:  65 (!) 56 64  Resp: 18  16   Temp: 98.8 F (37.1 C) 97.8 F (36.6 C)  98.1 F (36.7 C)  TempSrc: Axillary Oral  Oral  SpO2:  92% 92% 94%  Weight:      Height:        Intake/Output Summary (Last 24 hours) at 01/01/2024 1142 Last data filed at 01/01/2024 0400 Gross per 24 hour  Intake 956.35 ml  Output 400 ml  Net 556.35 ml      12/30/2023   10:28 PM 12/30/2023    9:44 PM 10/23/2023   11:10 AM  Last 3 Weights  Weight (lbs) 208 lb 8.9 oz 208 lb 8.9 oz 204 lb  Weight (kg) 94.6 kg 94.6 kg 92.534 kg     Body mass index is 36.94 kg/m.  General:  Well nourished, well developed, overweight male in no acute distress.  Alert and orientated x 4 on 2 L nasal cannula. HEENT: normal Neck: no JVD.  Patient does have a large neck circumference. Vascular: No carotid bruits; Distal pulses 2+ bilaterally Cardiac:  normal S1, S2; RRR; no murmur  Lungs: Wheezing heard throughout the lungs.  Crackles heard on left lower lung lobe Abd:  soft, nontender, no hepatomegaly. Ext: no edema Musculoskeletal:  No deformities. Skin: warm and dry  Neuro:   no focal abnormalities noted Psych:  Normal affect   EKG:  The EKG was personally reviewed and demonstrates:  normal sinus rhythm with a rate of 57 and inferior lateral ST elevation.  That does not meet criteria for STEMI and is present on prior EKGs. Telemetry:  Telemetry was personally reviewed and demonstrates: Normal sinus rhythm with heart rates in the 50s to 70s.  Patient does appear to have some wandering atrial pacer.  Lowest heart rates get is in the 40's had some pauses of about 2.5 seconds  overnight  Relevant CV Studies: Echo yesterday showed IMPRESSIONS     1. Left ventricular ejection fraction, by estimation, is 55 to 60%. The  left ventricle has normal function. The left ventricle has no regional  wall motion abnormalities. Left ventricular diastolic parameters are  consistent with Grade I diastolic  dysfunction (impaired relaxation).   2. Right ventricular systolic function is normal. The right ventricular  size is normal.   3. The mitral valve is normal in structure. Trivial mitral valve  regurgitation. No evidence of mitral stenosis.   4. The aortic valve is normal in structure. Aortic valve regurgitation is  not visualized. Aortic valve sclerosis is present, with no evidence of  aortic valve stenosis.   5. The inferior vena cava is normal in size with greater than 50%  respiratory variability, suggesting right atrial pressure of 3 mmHg.    Laboratory Data: High Sensitivity Troponin:   Recent Labs  Lab 12/31/23 0352 12/31/23 0553  TROPONINIHS 10 10     Chemistry Recent Labs  Lab 12/30/23 2152 12/31/23 0219 12/31/23 0352 12/31/23 0753 01/01/24 0349  NA 142   < > 142 144 139  K 4.8   < > 4.8 4.7 3.7  CL 96*  --  98  --  97*  CO2 34*  --  29  --  33*  GLUCOSE 106*  --  103*  --  111*  BUN 13  --  13  --  22  CREATININE 0.98  --  0.81  --  0.81  CALCIUM  9.1  --  9.1  --  8.5*  GFRNONAA >60  --  >60  --  >60  ANIONGAP 12  --  15  --  9   < > = values in this interval not displayed.    Recent Labs  Lab 12/30/23 2152 12/31/23 0352  PROT 7.1 7.1  ALBUMIN 2.8* 2.7*  AST 29 27  ALT 30 29  ALKPHOS 129* 127*  BILITOT 0.7 0.5   Lipids  Recent Labs  Lab 12/31/23 0352  CHOL 114  TRIG 72  HDL 35*  LDLCALC 65  CHOLHDL 3.3    Hematology Recent Labs  Lab 12/30/23 2152 12/31/23 0219 12/31/23 0352 12/31/23 0753 01/01/24 0349  WBC 19.9*  --  20.6*  --  21.2*  RBC 4.79  --  4.73  --  3.86*  HGB 15.4   < > 15.0 15.3 12.3*  HCT 51.1   < >  51.6 45.0 38.6*  MCV 106.7*  --  109.1*  --  100.0  MCH 32.2  --  31.7  --  31.9  MCHC 30.1  --  29.1*  --  31.9  RDW 16.7*  --  16.5*  --  15.9*  PLT PLATELET CLUMPS NOTED ON SMEAR, COUNT APPEARS ADEQUATE  --  285  --  214   < > = values in this interval not displayed.   Thyroid   Recent Labs  Lab 01/01/24 0349 01/01/24 0641  TSH  --  0.378  FREET4 0.75  --     BNPNo results for input(s): BNP, PROBNP in the last 168 hours.  DDimer No results for input(s): DDIMER in the last 168 hours.  Radiology/Studies:  DG Chest Port 1 View Result Date: 01/01/2024 EXAM: 1 VIEW(S) XRAY OF THE CHEST 01/01/2024 06:48:00 AM COMPARISON: Portable chest 12/30/2023. CLINICAL HISTORY: SOB (shortness of breath); Pt has hx of COPD and htn. FINDINGS: LUNGS AND PLEURA: The lungs are mildly emphysematous. There is decreased inspiration today with increased patchy opacity in the hypoexpanded left base, which could represent asymmetric atelectasis or consolidation. The remaining lungs appear generally clear. No pulmonary edema. No substantial pleural effusion. No pneumothorax. HEART AND MEDIASTINUM: There is mild cardiomegaly. No vascular congestion is seen. The mediastinum is stable. BONES AND SOFT TISSUES: No acute osseous abnormality. IMPRESSION: 1. Increased patchy opacity in the hypoexpanded left base, possibly representing asymmetric atelectasis or consolidation. Electronically signed by: Francis Quam MD 01/01/2024 07:04 AM EDT RP Workstation: [id number]   MR BRAIN WO CONTRAST Result Date: 12/31/2023 EXAM: MRI BRAIN WITHOUT CONTRAST 12/31/2023 10:38:21 PM TECHNIQUE: Multiplanar multisequence MRI of the head/brain was performed without the administration of intravenous contrast. COMPARISON: Comparison with prior CT from 12/30/2023. CLINICAL HISTORY: FINDINGS: LIMITATIONS: Examination severely degraded by motion artifact. BRAIN AND VENTRICLES: No acute infarct. No intracranial hemorrhage. No mass. No midline  shift. No hydrocephalus. T2/FLAIR hypertensity involving the periventricular white matter, most likely related to chronic small vessel ischemic disease, mild in nature. Few small remote lacunar infarcts noted about the right basal ganglia and right thalamus. The sella is unremarkable. Normal flow voids. ORBITS: No acute abnormality. SINUSES AND MASTOIDS: No acute abnormality. BONES AND SOFT TISSUES: Normal marrow signal. No acute soft tissue abnormality. Plagiocephaly noted. IMPRESSION: 1. Motion degraded exam. 2. No acute intracranial abnormality. 3. Mild chronic microvascular ischemic disease with a few small remote lacunar infarcts about the right basal ganglia and right thalamus. Electronically signed by: Morene Hoard MD 12/31/2023 11:16 PM EDT RP Workstation: [id number]   ECHOCARDIOGRAM COMPLETE Result Date: 12/31/2023    ECHOCARDIOGRAM REPORT   Patient Name:   Nathan Nicholson Date of Exam: 12/31/2023 Medical Rec #:  [id number]        Height:       63.0 in Accession #:    [id number]       Weight:       208.6 lb Date of Birth:  1955/05/22        BSA:          1.968 m Patient Age:    68 years         BP:           110/78 mmHg Patient Gender: M                HR:           83 bpm. Exam Location:  Inpatient Procedure: 2D Echo and Intracardiac Opacification Agent (Both Spectral and Color            Flow Doppler were utilized during procedure). Indications:    Dyspnea  History:        Patient has prior history of Echocardiogram examinations. CHF,                 CAD, COPD; Risk Factors:Hypertension.  Sonographer:  Charmaine Gaskins Referring Phys: [phone] REDIA LOISE CLEAVER  Sonographer Comments: Technically challenging study due to limited acoustic windows and Technically difficult study due to poor echo windows. Image acquisition challenging due to patient body habitus, Image acquisition challenging due to COPD and Image acquisition challenging due to respiratory motion. IMPRESSIONS  1. Left ventricular  ejection fraction, by estimation, is 55 to 60%. The left ventricle has normal function. The left ventricle has no regional wall motion abnormalities. Left ventricular diastolic parameters are consistent with Grade I diastolic dysfunction (impaired relaxation).  2. Right ventricular systolic function is normal. The right ventricular size is normal.  3. The mitral valve is normal in structure. Trivial mitral valve regurgitation. No evidence of mitral stenosis.  4. The aortic valve is normal in structure. Aortic valve regurgitation is not visualized. Aortic valve sclerosis is present, with no evidence of aortic valve stenosis.  5. The inferior vena cava is normal in size with greater than 50% respiratory variability, suggesting right atrial pressure of 3 mmHg. FINDINGS  Left Ventricle: Left ventricular ejection fraction, by estimation, is 55 to 60%. The left ventricle has normal function. The left ventricle has no regional wall motion abnormalities. Definity  contrast agent was given IV to delineate the left ventricular  endocardial borders. The left ventricular internal cavity size was normal in size. There is no left ventricular hypertrophy. Left ventricular diastolic parameters are consistent with Grade I diastolic dysfunction (impaired relaxation). Right Ventricle: The right ventricular size is normal. No increase in right ventricular wall thickness. Right ventricular systolic function is normal. Left [facility]: Left atrial size was normal in size. Right [facility]: Right atrial size was normal in size. Pericardium: There is no evidence of pericardial effusion. Presence of epicardial fat layer. Mitral Valve: The mitral valve is normal in structure. Trivial mitral valve regurgitation. No evidence of mitral valve stenosis. Tricuspid Valve: The tricuspid valve is normal in structure. Tricuspid valve regurgitation is not demonstrated. No evidence of tricuspid stenosis. Aortic Valve: The aortic valve is normal in structure.  Aortic valve regurgitation is not visualized. Aortic valve sclerosis is present, with no evidence of aortic valve stenosis. Pulmonic Valve: The pulmonic valve was normal in structure. Pulmonic valve regurgitation is not visualized. No evidence of pulmonic stenosis. Aorta: The aortic root is normal in size and structure. Venous: The inferior vena cava is normal in size with greater than 50% respiratory variability, suggesting right atrial pressure of 3 mmHg. IAS/Shunts: No atrial level shunt detected by color flow Doppler.  LEFT VENTRICLE PLAX 2D LVIDd:         5.50 cm   Diastology LVIDs:         3.50 cm   LV e' medial:    7.18 cm/s LV PW:         0.90 cm   LV E/e' medial:  14.2 LV IVS:        1.00 cm   LV e' lateral:   8.38 cm/s LVOT diam:     2.40 cm   LV E/e' lateral: 12.2 LVOT Area:     4.52 cm  RIGHT VENTRICLE RV Basal diam:  3.10 cm RV Mid diam:    1.90 cm RV S prime:     13.70 cm/s LEFT ATRIUM             Index        RIGHT ATRIUM           Index LA diam:        3.60 cm  1.83 cm/m   RA Area:     19.40 cm LA Vol (A2C):   58.7 ml 29.82 ml/m  RA Volume:   55.00 ml  27.94 ml/m LA Vol (A4C):   44.7 ml 22.71 ml/m LA Biplane Vol: 51.0 ml 25.91 ml/m   AORTA Ao Root diam: 3.50 cm Ao Asc diam:  3.20 cm MITRAL VALVE MV Area (PHT): 4.10 cm     SHUNTS MV Decel Time: 185 msec     Systemic Diam: 2.40 cm MV E velocity: 102.00 cm/s MV A velocity: 116.00 cm/s MV E/A ratio:  0.88 Kardie Tobb DO Electronically signed by Dub Huntsman DO Signature Date/Time: 12/31/2023/1:33:16 PM    Final    CT ANGIO HEAD NECK W WO CM W PERF (CODE STROKE) Result Date: 12/30/2023 EXAM: CTA Head and Neck with Perfusion 12/30/2023 10:04:03 PM TECHNIQUE: CTA of the head and neck was performed with the administration of intravenous contrast. 3D postprocessing with multiplanar reconstructions and MIPs was performed to evaluate the vascular anatomy. Cerebral perfusion analysis using computed tomography with contrast administration, including  post-processing of parametric maps with determination of cerebral blood flow, cerebral blood volume, mean transit time and time-to-maximum. Automated exposure control, iterative reconstruction, and/or weight based adjustment of the mA/kV was utilized to reduce the radiation dose to as low as reasonably achievable. COMPARISON: None available CLINICAL HISTORY: Neuro deficit, acute, stroke suspected. Code stroke, aphasia and slurred speech; Dr. Michaela FINDINGS: AORTIC ARCH AND ARCH VESSELS: No dissection or arterial injury. No significant stenosis of the brachiocephalic or subclavian arteries. CERVICAL CAROTID ARTERIES: Approximately 40% of the right ICA proximally. CERVICAL VERTEBRAL ARTERIES: No dissection, arterial injury, or significant stenosis. LUNGS AND MEDIASTINUM: Unremarkable. SOFT TISSUES: No acute abnormality. BONES: No acute abnormality. ANTERIOR CIRCULATION: No significant stenosis of the internal carotid arteries. No significant stenosis of the anterior cerebral arteries. No significant stenosis of the middle cerebral arteries. No aneurysm. POSTERIOR CIRCULATION: No significant stenosis of the posterior cerebral arteries. No significant stenosis of the basilar artery. No significant stenosis of the vertebral arteries. No aneurysm. OTHER: No dural venous sinus thrombosis on this non-dedicated study. EXAM QUALITY: Exam quality is adequate with diagnostic perfusion maps. No significant motion artifact. Appropriate arterial inflow and venous outflow curves. CORE INFARCT (CBF<30% volume): 0 mL TOTAL HYPOPERFUSION (Tmax>6s volume): 10 mL Mismatch volume: 10  mL Location: High left partietal convexity and left temporal bone. IMPRESSION: 1. No acute large vessel occlusion. 2. Approximately 40% of the right proximal ICA. 3. No convincing infarct or penumbra. Reported mismatch is favored artifactual. MRI could better assess for acute infarct if clinically warranted. Electronically signed by: Gilmore Molt  MD 12/30/2023 10:32 PM EDT RP Workstation: [id number]   DG Chest Portable 1 View Result Date: 12/30/2023 EXAM: 1 VIEW(S) XRAY OF THE CHEST 12/30/2023 10:24:00 PM COMPARISON: Chest x-ray 07/10/2023. CLINICAL HISTORY: ams workup. ams FINDINGS: LUNGS AND PLEURA: Increased central interstitial markings bilaterally. Increased interstitial opacities in the left lung base. No pulmonary edema. No pleural effusion. No pneumothorax. HEART AND MEDIASTINUM: No acute abnormality of the cardiac and mediastinal silhouettes. BONES AND SOFT TISSUES: No acute osseous abnormality. IMPRESSION: 1. Increased central interstitial markings bilaterally and increased interstitial opacities in the left lung base. Findings may be related to mild edema or an infectious/inflammatory process. Electronically signed by: Greig Pique MD 12/30/2023 10:29 PM EDT RP Workstation: [id number]   CT HEAD CODE STROKE WO CONTRAST Result Date: 12/30/2023 EXAM: CT HEAD WITHOUT 12/30/2023 09:50:34 PM TECHNIQUE: CT of the head was performed without  the administration of intravenous contrast. Automated exposure control, iterative reconstruction, and/or weight based adjustment of the mA/kV was utilized to reduce the radiation dose to as low as reasonably achievable. COMPARISON: None available. CLINICAL HISTORY: Neuro deficit, acute, stroke suspected. Dysarthria FINDINGS: BRAIN AND VENTRICLES: No acute intracranial hemorrhage. No mass effect or midline shift. No extra-axial fluid collection. No evidence of acute infarct. No hydrocephalus. White matter changes, compatible with chronic microvascular ischemic change. Motion limited study. ORBITS: No acute abnormality. SINUSES AND MASTOIDS: No acute abnormality. SOFT TISSUES AND SKULL: No acute skull fracture. No acute soft tissue abnormality. Other: Findings conveyed to Dr. Michaela via pager at 10:04 PM. . IMPRESSION: 1. Motion-limited study without evidence of acute intracranial abnormality. ASPECTS 10.  Electronically signed by: Gilmore Molt MD 12/30/2023 10:05 PM EDT RP Workstation: [id number]     Assessment and Plan:  JIVAN SYMANSKI is a 68 y.o. male with a hx of tobacco abuse, hypertension, hyperlipidemia, aortic atherosclerosis, felty's syndrome s/p splenectomy, COPD, obesity, PVCs, and CAD s/p PCI to LAD on 12/2021 who is being seen 01/01/2024 for the evaluation of bradycardia at the request of Lavada Stank MD.  Altered mental status Community-acquired pneumonia Patient presented to the emergency department for slurred speech and right-sided weakness.  A brain MRI was done and was negative for acute stroke.  He was found to have community-acquired pneumonia.  It was suspected mental changes are due to metabolic encephalopathy.  Management per primary   Nocturnal pauses Overnight patient had nocturnal pauses that have lasted between 2 and 3 seconds.  I did not see any pauses that lasted longer than 3 seconds.  Patient denies any symptoms such as syncope or lightheadedness. The patient reported that he does snore at night but denies poor sleep quality or daytime somnolence.  Given obesity with a BMI of 36 and large neck circumference on exam suspect that patient likely has OSA  Last night the patient wore a CPAP and did not like wearing it. Recommend outpatient sleep study. Stop Bystolic 10mg  daily Avoid AV nodal agents.   CAD s/p PCI to LAD on 12/2021  Hyperlipidemia On 12/2021 the patient had an anterior NSTEMI.  While awaiting bed placement he went into V-fib cardiac arrest and required CPR.  ROSC was achieved within 2 minutes. He underwent emergent cardiac catheterization and received a stent to his LAD.  In that hospitalization the patient went into cardiogenic shock and required an Impella.  Over time the patient improved and was able to be discharged home. This hospitalization patient denies chest pain or shortness of breath.   High-sensitivity troponins negative x  2. EKG showed normal sinus rhythm with a rate of 57 and inferior lateral ST elevation.  That does not meet criteria for STEMI and is present on prior EKGs.  I suspect these are residual from MI in 2023. Echo showed a normal LVEF of 55 to 60%, no RWMA, no LVH, G1 DD, and grossly normal valve function. stop aspirin  81 mg daily. continue Plavix 75mg  daily Continue atorvastatin  80 mg daily   Hypertension Blood pressure is labile but overall well-managed. Stop Bystolic 10mg  daily. May consider starting losartan 50 mg daily.  Otherwise management per primary  Risk Assessment/Risk Scores:    [facility] will sign off.   The patient is ready for discharge today from a cardiac standpoint. Medication Recommendations:  as above Other recommendations (labs, testing, etc):  sleep study Follow up as an outpatient:  follow [facility] with [facility]  For  questions or updates, please contact [facility] Please consult [email or web address] for contact info under   Signed, Morse Clause, PA-C  01/01/2024 11:42 AM  History and all data above reviewed.  I personally took the history today, performed the physical exam and formulated the assessment and plan.  The patient presents with AMS and is being treated for a CAP.  He lives at home with his wife and reports that he wears 2l [place] O2 all of the time.  He sleeps with his head slightly raised.  He has had no chest pain, PND or orthopnea.  He does snore but his wife does not report apnea.  He has had no palpitations, syncope or presyncope.   I reviewed all relevant tests and studies.  Patient examined.  I agree with the findings as above.  The patient exam reveals COR:RRR  ,  Lungs:   Diffuse expiratory wheezing   ,  Abd: Morbidly obese, Positive bowel sounds, no rebound no guarding , Ext No edema   .   All available labs, radiology testing, previous records reviewed. Agree with documented assessment and plan.   Bradycardia:  Nocturnal and likely related  mostly to sleep apnea.  He also has been on beta blocker.  He has no symptoms related and no syncope/presyncope.  No indication for pacing.  I agree with holding the beta blocker and with treating presumed sleep apnea.  We can monitor as an outpatient.  I would suggest that after he has been off the beta blocker for a few weeks and is established on CPAP that his primary cardiologists (not in our group) can follow up with a ZIO.    CAD:  He does not need DAPT at this point and I would suggest stopping the ASA.    Lynwood Omah Dewalt  4:14 PM  01/01/2024

## 2024-01-01 NOTE — Consult Note (Signed)
 WOC Nurse Consult Note: Reason for Consult: open wound abdomen and buttocks  Wound type: 1. Full thickness abdomen ? Old non healing surgical wound vs other red moist  2.  Moisture Associated Skin Damage to buttocks and intergluteal cleft; linear red moist area intergluteal cleft  3.  Intertriginous dermatitis underneath pannus erythema with scattered partial thickness skin loss  ICD-10 CM Codes for Irritant Dermatitis L24A0 - Due to friction or contact with body fluids; unspecified  L30.4  - Erythema intertrigo. Also used for abrasion of the hand, chafing of the skin, dermatitis due to sweating and friction, friction dermatitis, friction eczema, and genital/thigh intertrigo.  Pressure Injury POA: NA not pressure  Measurement: see nursing flowsheet  Wound bed: as above  Drainage (amount, consistency, odor) see nursing flowsheet  Periwound: Dressing procedure/placement/frequency:  Cleanse mid abdominal wound with NS, apply silver hydrofiber (Aquacel AG Lawson #[id number]) to wound bed daily and secure with silicone foam or ABD pad and tape whichever is preferred.  Cleanse intergluteal cleft/coccyx wound with NS, apply a strip of silver hydrofiber Soila [phone]) to wound bed daily and secure with silicone foam.  Cleanse underneath pannus/abdominal folds with soap and water , dry and apply Interdry AG as follows:  Order Gerlean # [phone] Measure and cut length of InterDry to fit in skin folds that have skin breakdown Tuck InterDry fabric into skin folds in a single layer, allow for 2 inches of overhang from skin edges to allow for wicking to occur May remove to bathe; dry area thoroughly and then tuck into affected areas again Do not apply any creams or ointments when using InterDry DO NOT THROW AWAY FOR 5 DAYS unless soiled with stool DO NOT [facility] product, this will inactivate the silver in the material  New sheet of Interdry should be applied after 5 days of use if patient continues to have skin  breakdown   Will order Gerhardt's Butt Cream for buttocks 2 times daily and prn soiling.    POC discussed with bedside nurse. Appreciate R. Saddler, RN assistance with this consult.   WOC team will not follow Re-consult if further needs arise.   Thank you,    Powell Bar MSN, RN-BC, [employer]

## 2024-01-01 NOTE — Progress Notes (Signed)
 Patient wears BiPAP at bedtime with an IPAP of 12 and EPAP of 8.

## 2024-01-01 NOTE — Evaluation (Addendum)
 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 Karolynn by hematologist Dr. Cloretta   Myocardial infarction [facility])    Oxygen  dependent    2L via [place]   Pneumonia    Rheumatoid arthritis (HCC)    Past Surgical History:  Past Surgical History:  Procedure Laterality Date   FOOT SURGERY     left   HERNIA REPAIR     IR KYPHO EA ADDL LEVEL THORACIC OR LUMBAR  06/17/2021   IR KYPHO EA ADDL LEVEL THORACIC OR LUMBAR  07/12/2021   IR KYPHO LUMBAR INC FX REDUCE BONE BX UNI/BIL CANNULATION INC/IMAGING  07/11/2021   IR KYPHO LUMBAR INC FX REDUCE BONE BX UNI/BIL CANNULATION INC/IMAGING  10/05/2021   IR KYPHO THORACIC WITH BONE BIOPSY  06/17/2021   IR KYPHO THORACIC WITH BONE BIOPSY  08/15/2023   IR RADIOLOGIST EVAL & MGMT  05/18/2021   IR RADIOLOGIST EVAL & MGMT  08/08/2023   MULTIPLE TOOTH EXTRACTIONS     ALL TEETH EXT, DENTURES   RADIOLOGY WITH ANESTHESIA N/A 06/17/2021   Procedure: KYPHOPLASTY;  Surgeon: de Macedo Rodrigues, Katyucia, MD;  Location: [facility] OR;  Service: Radiology;  Laterality: N/A;   RADIOLOGY WITH ANESTHESIA N/A 07/11/2021   Procedure: KYPHOPLASTY;  Surgeon: de Macedo Rodrigues, Katyucia, MD;  Location: [facility] OR;  Service: Radiology;  Laterality: N/A;   RADIOLOGY WITH ANESTHESIA N/A 10/05/2021   Procedure: L3 Kyphoplasty;  Surgeon: de Macedo Rodrigues, Katyucia, MD;  Location: [facility] OR;  Service: Radiology;  Laterality: N/A;   SPLENECTOMY     TOOTH EXTRACTION     TOTAL HIP  ARTHROPLASTY Right 08/17/2020   Procedure: TOTAL HIP ARTHROPLASTY ANTERIOR APPROACH;  Surgeon: Ernie Cough, MD;  Location: [facility] ORS;  Service: Orthopedics;  Laterality: Right;  70 min   HPI:  68 y.o. M presenting initially to the ED on 10/26 for increased lethargy and slurred speech.  CT head without acute findings and MRI no acute intracranial abnormality, mild chronic microvascular ischemic disease with a few small remote lacunar infarcts about the right basal ganglia and right thalamus, Pt with acute respiratory failure with hypercarbia and placed on bipap. Pt with PNA and metabolic encephalopathy. PMH of COPD, CAD s/p PCI, HTN, rheumatoid arthritis.    Assessment / Plan / Recommendation  Clinical Impression  Pt alert, mentation appears to be at baseline per pt and per MD improved from admission. He denies prior swallow difficulty. His respiratory status was stable on arrival, no SOB and this continued throughout assessment. He is endentulous, dentures not present and states he can chew without them. He consumed between 2-3 oz of water  via straw continuously without s/s aspiration. Oral transit and swallow initiation appeared timely per clinical judgement. Masticated multiple pieces cracker timely without residue. Work of breathing continued to be stable. He did have one delayed cough at end of assessment and pt states he sometimes coughs at the end of meals however not freqauently. He denies GERD esophageal involvement  present this can cause post prandial coughing. He is at mild aspiration (silent) risk due to hx of COPD. MD arrived and stated he has low suspicion of aspiration however unable to determine at bedside.  Discussed various textures and pt agreed with initiating regular texture and although masticating efficiently without dentures, encouraged pt to ask wife to bring them in (pt agreed). Thin liquids and pills with water . Encouraged pt to stay upright after meals. ST will follow up for safety  and efficiency and if needs instrumental assessment to further assess pharyngeal swallow (with current pna and COPD) would recommend at that time. SLP Visit Diagnosis: Dysphagia, unspecified (R13.10)   ADDENDUM: Pt's speech-language-cognition screened. Per notes/MD he appears back to baseline. No new neuro deficits on MRI. Speech clear, language appropriate and cognition appropriate during swallow assessment. Pt does not have concerns and full assessment not needed.     Aspiration Risk  Mild aspiration risk    Diet Recommendation Regular;Thin liquid    Liquid Administration via: Straw;Cup Supervision: Patient able to self feed Compensations: Slow rate;Small sips/bites Postural Changes: Seated upright at 90 degrees;Remain upright for at least 30 minutes after po intake    Other  Recommendations Oral Care Recommendations: Oral care BID     Assistance Recommended at Discharge    Functional Status Assessment Patient has had a recent decline in their functional status and demonstrates the ability to make significant improvements in function in a reasonable and predictable amount of time.  Frequency and Duration min 1 x/week  1 week       Prognosis Prognosis for improved oropharyngeal function: Good      Swallow Study   General HPI: 68 y.o. M presenting initially to the ED on 10/26 for increased lethargy and slurred speech.  CT head without acute findings and MRI no acute intracranial abnormality, mild chronic microvascular ischemic disease with a few small remote lacunar infarcts about the right basal ganglia and right thalamus, Pt with acute respiratory failure with hypercarbia and placed on bipap. Pt with PNA and metabolic encephalopathy. PMH of COPD, CAD s/p PCI, HTN, rheumatoid arthritis. Type of Study: Bedside Swallow Evaluation Previous Swallow Assessment:  (none) Diet Prior to this Study: NPO Temperature Spikes Noted: No Respiratory Status: Nasal cannula History of Recent  Intubation: No Behavior/Cognition: Alert;Cooperative;Pleasant mood Oral Cavity Assessment: Within Functional Limits Oral Care Completed by SLP: No Oral Cavity - Dentition: Dentures, not available (wears when he eats but can eat without them) Vision: Functional for self-feeding Self-Feeding Abilities: Able to feed self Patient Positioning: Upright in bed Baseline Vocal Quality: Normal Volitional Cough: Strong Volitional Swallow: Able to elicit    Oral/Motor/Sensory Function Overall Oral Motor/Sensory Function: Within functional limits   Ice Chips Ice chips: Not tested   Thin Liquid Thin Liquid: Within functional limits Presentation: Cup;Straw    Nectar Thick Nectar Thick Liquid: Not tested   Honey Thick Honey Thick Liquid: Not tested   Puree Puree: Within functional limits   Solid     Solid: Within functional limits      Erma Joubert, Olam Bull 01/01/2024,10:11 AM

## 2024-01-01 NOTE — Procedures (Signed)
 Patient Name: MCKENNA BORUFF  MRN: [id number]  Epilepsy Attending: Arlin MALVA Krebs  Referring Physician/Provider: Dennise Lavada POUR, MD  Date: 01/01/2024 Duration: 23.50 mins  Patient history: 68yo M with ams. EEG to evaluate for seizure  Level of alertness: Awake  AEDs during EEG study: None  Technical aspects: This EEG study was done with scalp electrodes positioned according to the 10-20 International system of electrode placement. Electrical activity was reviewed with band pass filter of 1-70Hz , sensitivity of 7 uV/mm, display speed of 46mm/sec with a 60Hz  notched filter applied as appropriate. EEG data were recorded continuously and digitally stored.  Video monitoring was available and reviewed as appropriate.  Description: The posterior dominant rhythm consists of 8 Hz activity of moderate voltage (25-35 uV) seen predominantly in posterior head regions, symmetric and reactive to eye opening and eye closing. Hyperventilation and photic stimulation were not performed.     IMPRESSION: This study is within normal limits. No seizures or epileptiform discharges were seen throughout the recording.  A normal interictal EEG does not exclude the diagnosis of epilepsy.   Rease Swinson O Miguelangel Korn

## 2024-01-01 NOTE — Progress Notes (Signed)
 Per RN, check back in 30 mins, pt is in need of bed change.

## 2024-01-01 NOTE — Progress Notes (Signed)
 PROGRESS NOTE                                                                                                                                                                                                             Patient Demographics:    Nathan Nicholson, is a 68 y.o. male, DOB - 1955-12-08, FMW:[id number]  Outpatient Primary MD for the patient is Pandora Therisa RAMAN, NP    LOS - 1  Admit date - 12/30/2023    Chief Complaint  Patient presents with   Code Stroke       Brief Narrative (HPI from H&P)     68 y.o. male with PMH of f COPD, CAD status post PCI in 2023, hypertension, rheumatoid arthritis was brought to the ED for increasing lethargic sleepy and also having slurred speech.at baseline is largely bedbound but able to usually hold on a conversation. In the ZI:ozuyjmhpr. CT head>>nothing acute CT angiogram of the head and neck>>no LVO. Neurology on-call was consulted.  MRI brain is pending. ABG shows pH of 7.26 pCO2 of 90.  WBC count is 19.9.  Chest x-ray shows possible infiltrates.  Given the hypercarbic respiratory failure patient is placed on BiPAP and started on IV antibiotics for pneumonia with acute encephalopathy and acute on chronic hypercarbic and hypoxic respiratory failure.   Subjective:    Augusta Sprang today has, No headache, No chest pain, No abdominal pain - No Nausea, No new weakness tingling or numbness, mild cough, no SOB.   Assessment  & Plan :   Acute respiratory failure with acute hypercarbia and acute on chronic hypoxia, on 2 L nasal cannula oxygen  at home, not on CPAP at night, does have clinical evidence of pneumonia, also uses narcotics at home and likely has undiagnosed OHS and OSA as well.  Metabolic encephalopathy   Does have pneumonia upon admission, did have acute hypercarbic and hypoxic respiratory failure in the ER, likely some element of undiagnosed OSA OHS, currently on nighttime BiPAP,  nasal cannula oxygen  and daytime, antibiotics, nebulizer treatments, mentation and respiratory status much improved.  Continue treating for pneumonia nighttime BiPAP, outpatient sleep study will need to be discharged on BiPAP nighttime.  Also has some crackles on exam will give her trial of Lasix.  Has been seen by pulmonary in the ER as well.   Community-acquired pneumonia:  Continue antibiotics, follow cultures, follow inflammatory markers will check CRP and procalcitonin.   Acute metabolic encephalopathy:  Due to above, CT and MRI brain unremarkable, case discussed with neurology nothing else to offer.  Stable TSH and ammonia.   Mild acute on chronic diastolic CHF EF 60%.  Trial of Lasix and monitor.    CAD status post PCI in 2023:  Serial troponin negative x 2.  Continue aspirin  per rectally for now.   Carotid artery disease.  On CTA noted to have >> Approximately 40% of the right proximal ICA, resume DAPT, continue home dose statin.  Outpatient vascular surgery follow-up postdischarge.  Hypertension   Stable   Macrocytosis:  Monitor hb.   History of rheumatoid arthritis  per the chart.   Obesity.  BMI 36.  Follow-up with PCP.    Diabetes mellitus type 2:  A1c stable, cbg stable.  Continue SSI         Condition - Extremely Guarded  Family Communication  : Called wife on 01/01/2024 at 9:40 AM detailed message left on her cell phone. [phone]   Code Status :  Full  Consults  :   PCCM, Neuro  PUD Prophylaxis :     Procedures  :     EEG  TTE  1. Left ventricular ejection fraction, by estimation, is 55 to 60%. The left ventricle has normal function. The left ventricle has no regional wall motion abnormalities. Left ventricular diastolic parameters are consistent with Grade I diastolic dysfunction (impaired relaxation).  2. Right ventricular systolic function is normal. The right ventricular size is normal.  3. The mitral valve is normal in structure. Trivial mitral valve  regurgitation. No evidence of mitral stenosis.  4. The aortic valve is normal in structure. Aortic valve regurgitation is not visualized. Aortic valve sclerosis is present, with no evidence of aortic valve stenosis.  5. The inferior vena cava is normal in size with greater than 50% respiratory variability, suggesting right atrial pressure of 3 mmHg.   MRI - 1. Motion degraded exam. 2. No acute intracranial abnormality. 3. Mild chronic microvascular ischemic disease with a few small remote lacunar infarcts about the right basal ganglia and right thalamus  CTA  1. No acute large vessel occlusion. 2. Approximately 40% of the right proximal ICA. 3. No convincing infarct or penumbra      Disposition Plan  :    Status is: Inpatient   DVT Prophylaxis  :  Lovenox     Lab Results  Component Value Date   PLT 214 01/01/2024    Diet :  Diet Order             Diet regular Room service appropriate? Yes with Assist; Fluid consistency: Thin  Diet effective now                    Inpatient Medications  Scheduled Meds:   stroke: early stages of recovery book   Does not apply Once   arformoterol  15 mcg Nebulization BID   aspirin  EC  81 mg Oral q morning   atorvastatin   80 mg Oral QHS   budesonide  (PULMICORT ) nebulizer solution  0.5 mg Nebulization BID   clopidogrel  75 mg Oral Daily   cyanocobalamin   1,000 mcg Intramuscular Q30 days   enoxaparin  (LOVENOX ) injection  45 mg Subcutaneous Q24H   folic acid   1 mg Oral q morning   furosemide  40 mg Intravenous Once   Gerhardt's butt cream   Topical BID  insulin  aspart  0-6 Units Subcutaneous TID WC   nebivolol  10 mg Oral Daily   potassium chloride   40 mEq Oral Once   revefenacin  175 mcg Nebulization Daily   Continuous Infusions:  azithromycin  500 mg (01/01/24 0032)   cefTRIAXone  (ROCEPHIN )  IV Stopped (01/01/24 0818)   PRN Meds:.acetaminophen  **OR** acetaminophen  (TYLENOL ) oral liquid 160 mg/5 mL **OR** acetaminophen , albuterol ,  nitroGLYCERIN    Objective:   Vitals:   01/01/24 0200 01/01/24 0302 01/01/24 0734 01/01/24 0800  BP: 107/62 112/62 (!) 112/55   Pulse:   65   Resp: 14 18    Temp:  98.8 F (37.1 C) 97.8 F (36.6 C)   TempSrc:  Axillary Oral   SpO2:   92% 92%  Weight:      Height:        Wt Readings from Last 3 Encounters:  12/30/23 94.6 kg  10/23/23 92.5 kg  08/15/23 92.1 kg     Intake/Output Summary (Last 24 hours) at 01/01/2024 1015 Last data filed at 01/01/2024 0400 Gross per 24 hour  Intake 956.35 ml  Output 400 ml  Net 556.35 ml     Physical Exam  Awake Alert, No new F.N deficits, Normal affect [place].AT,PERRAL Supple Neck, No JVD,   Symmetrical Chest wall movement, Good air movement bilaterally, few bibasilar rales RRR,No Gallops,Rubs or new Murmurs,  +ve B.Sounds, Abd Soft, No tenderness,   No Cyanosis, Clubbing or edema      Data Review:    Recent Labs  Lab 12/30/23 2152 12/31/23 0219 12/31/23 0352 12/31/23 0753 01/01/24 0349  WBC 19.9*  --  20.6*  --  21.2*  HGB 15.4 15.6 15.0 15.3 12.3*  HCT 51.1 46.0 51.6 45.0 38.6*  PLT PLATELET CLUMPS NOTED ON SMEAR, COUNT APPEARS ADEQUATE  --  285  --  214  MCV 106.7*  --  109.1*  --  100.0  MCH 32.2  --  31.7  --  31.9  MCHC 30.1  --  29.1*  --  31.9  RDW 16.7*  --  16.5*  --  15.9*  LYMPHSABS 3.2  --   --   --   --   MONOABS 1.9*  --   --   --   --   EOSABS 0.1  --   --   --   --   BASOSABS 0.1  --   --   --   --     Recent Labs  Lab 12/30/23 2152 12/30/23 2303 12/31/23 0219 12/31/23 0352 12/31/23 0553 12/31/23 0753 01/01/24 0349 01/01/24 0641  NA 142  --  139 142  --  144 139  --   K 4.8  --  4.3 4.8  --  4.7 3.7  --   CL 96*  --   --  98  --   --  97*  --   CO2 34*  --   --  29  --   --  33*  --   ANIONGAP 12  --   --  15  --   --  9  --   GLUCOSE 106*  --   --  103*  --   --  111*  --   BUN 13  --   --  13  --   --  22  --   CREATININE 0.98  --   --  0.81  --   --  0.81  --   AST 29  --   --  27  --    --   --   --   ALT 30  --   --  29  --   --   --   --   ALKPHOS 129*  --   --  127*  --   --   --   --   BILITOT 0.7  --   --  0.5  --   --   --   --   ALBUMIN 2.8*  --   --  2.7*  --   --   --   --   INR  --  SPECIMEN CLOTTED  --  1.0  --   --   --   --   TSH  --   --   --   --  0.441  --   --  0.378  HGBA1C  --   --   --  6.3*  --   --   --   --   AMMONIA  --   --   --   --  47*  --   --   --   CALCIUM  9.1  --   --  9.1  --   --  8.5*  --       Recent Labs  Lab 12/30/23 2152 12/30/23 2303 12/31/23 0352 12/31/23 0553 01/01/24 0349 01/01/24 0641  INR  --  SPECIMEN CLOTTED 1.0  --   --   --   TSH  --   --   --  0.441  --  0.378  HGBA1C  --   --  6.3*  --   --   --   AMMONIA  --   --   --  47*  --   --   CALCIUM  9.1  --  9.1  --  8.5*  --    Lab Results  Component Value Date   CHOL 114 12/31/2023   HDL 35 (L) 12/31/2023   LDLCALC 65 12/31/2023   TRIG 72 12/31/2023   CHOLHDL 3.3 12/31/2023    Lab Results  Component Value Date   HGBA1C 6.3 (H) 12/31/2023   Recent Labs    01/01/24 0349 01/01/24 0641  TSH  --  0.378  FREET4 0.75  --     Micro Results Recent Results (from the past 240 hours)  Blood culture (routine x 2)     Status: None (Preliminary result)   Collection Time: 12/30/23 11:03 PM   Specimen: BLOOD RIGHT HAND  Result Value Ref Range Status   Specimen Description BLOOD RIGHT HAND  Final   Special Requests   Final    BOTTLES DRAWN AEROBIC AND ANAEROBIC Blood Culture results may not be optimal due to an inadequate volume of blood received in culture bottles   Culture   Final    NO GROWTH 2 DAYS Performed at [facility], [address] [place], [place] [address]    Report Status PENDING  Incomplete  Blood culture (routine x 2)     Status: None (Preliminary result)   Collection Time: 12/30/23 11:03 PM   Specimen: BLOOD LEFT ARM  Result Value Ref Range Status   Specimen Description BLOOD LEFT ARM  Final   Special Requests   Final     BOTTLES DRAWN AEROBIC AND ANAEROBIC Blood Culture results may not be optimal due to an inadequate volume of blood received in culture bottles   Culture   Final    NO GROWTH 2  DAYS Performed at [facility], [address], [place], [place] [address]    Report Status PENDING  Incomplete  MRSA Next Gen by PCR, Nasal     Status: None   Collection Time: 01/01/24  5:57 AM   Specimen: Nasal Mucosa; Nasal Swab  Result Value Ref Range Status   MRSA by PCR Next Gen NOT DETECTED NOT DETECTED Final    Comment: (NOTE) The GeneXpert MRSA Assay (FDA approved for NASAL specimens only), is one component of a comprehensive MRSA colonization surveillance program. It is not intended to diagnose MRSA infection nor to guide or monitor treatment for MRSA infections. Test performance is not FDA approved in patients less than 31 years old. Performed at [facility], [address], [place], [place] [address]     Radiology Report DG Chest Port 1 View Result Date: 01/01/2024 EXAM: 1 VIEW(S) XRAY OF THE CHEST 01/01/2024 06:48:00 AM COMPARISON: Portable chest 12/30/2023. CLINICAL HISTORY: SOB (shortness of breath); Pt has hx of COPD and htn. FINDINGS: LUNGS AND PLEURA: The lungs are mildly emphysematous. There is decreased inspiration today with increased patchy opacity in the hypoexpanded left base, which could represent asymmetric atelectasis or consolidation. The remaining lungs appear generally clear. No pulmonary edema. No substantial pleural effusion. No pneumothorax. HEART AND MEDIASTINUM: There is mild cardiomegaly. No vascular congestion is seen. The mediastinum is stable. BONES AND SOFT TISSUES: No acute osseous abnormality. IMPRESSION: 1. Increased patchy opacity in the hypoexpanded left base, possibly representing asymmetric atelectasis or consolidation. Electronically signed by: Francis Quam MD 01/01/2024 07:04 AM EDT RP Workstation: [id number]   MR BRAIN WO CONTRAST Result Date:  12/31/2023 EXAM: MRI BRAIN WITHOUT CONTRAST 12/31/2023 10:38:21 PM TECHNIQUE: Multiplanar multisequence MRI of the head/brain was performed without the administration of intravenous contrast. COMPARISON: Comparison with prior CT from 12/30/2023. CLINICAL HISTORY: FINDINGS: LIMITATIONS: Examination severely degraded by motion artifact. BRAIN AND VENTRICLES: No acute infarct. No intracranial hemorrhage. No mass. No midline shift. No hydrocephalus. T2/FLAIR hypertensity involving the periventricular white matter, most likely related to chronic small vessel ischemic disease, mild in nature. Few small remote lacunar infarcts noted about the right basal ganglia and right thalamus. The sella is unremarkable. Normal flow voids. ORBITS: No acute abnormality. SINUSES AND MASTOIDS: No acute abnormality. BONES AND SOFT TISSUES: Normal marrow signal. No acute soft tissue abnormality. Plagiocephaly noted. IMPRESSION: 1. Motion degraded exam. 2. No acute intracranial abnormality. 3. Mild chronic microvascular ischemic disease with a few small remote lacunar infarcts about the right basal ganglia and right thalamus. Electronically signed by: Morene Hoard MD 12/31/2023 11:16 PM EDT RP Workstation: [id number]   ECHOCARDIOGRAM COMPLETE Result Date: 12/31/2023    ECHOCARDIOGRAM REPORT   Patient Name:   KRRISH FREUND Date of Exam: 12/31/2023 Medical Rec #:  [id number]        Height:       63.0 in Accession #:    [id number]       Weight:       208.6 lb Date of Birth:  September 20, 1955        BSA:          1.968 m Patient Age:    68 years         BP:           110/78 mmHg Patient Gender: M                HR:           83 bpm. Exam  Location:  Inpatient Procedure: 2D Echo and Intracardiac Opacification Agent (Both Spectral and Color            Flow Doppler were utilized during procedure). Indications:    Dyspnea  History:        Patient has prior history of Echocardiogram examinations. CHF,                 CAD, COPD; Risk  Factors:Hypertension.  Sonographer:    Charmaine Gaskins Referring Phys: [phone] REDIA LOISE CLEAVER  Sonographer Comments: Technically challenging study due to limited acoustic windows and Technically difficult study due to poor echo windows. Image acquisition challenging due to patient body habitus, Image acquisition challenging due to COPD and Image acquisition challenging due to respiratory motion. IMPRESSIONS  1. Left ventricular ejection fraction, by estimation, is 55 to 60%. The left ventricle has normal function. The left ventricle has no regional wall motion abnormalities. Left ventricular diastolic parameters are consistent with Grade I diastolic dysfunction (impaired relaxation).  2. Right ventricular systolic function is normal. The right ventricular size is normal.  3. The mitral valve is normal in structure. Trivial mitral valve regurgitation. No evidence of mitral stenosis.  4. The aortic valve is normal in structure. Aortic valve regurgitation is not visualized. Aortic valve sclerosis is present, with no evidence of aortic valve stenosis.  5. The inferior vena cava is normal in size with greater than 50% respiratory variability, suggesting right atrial pressure of 3 mmHg. FINDINGS  Left Ventricle: Left ventricular ejection fraction, by estimation, is 55 to 60%. The left ventricle has normal function. The left ventricle has no regional wall motion abnormalities. Definity  contrast agent was given IV to delineate the left ventricular  endocardial borders. The left ventricular internal cavity size was normal in size. There is no left ventricular hypertrophy. Left ventricular diastolic parameters are consistent with Grade I diastolic dysfunction (impaired relaxation). Right Ventricle: The right ventricular size is normal. No increase in right ventricular wall thickness. Right ventricular systolic function is normal. Left Atrium: Left atrial size was normal in size. Right Atrium: Right atrial size was normal in  size. Pericardium: There is no evidence of pericardial effusion. Presence of epicardial fat layer. Mitral Valve: The mitral valve is normal in structure. Trivial mitral valve regurgitation. No evidence of mitral valve stenosis. Tricuspid Valve: The tricuspid valve is normal in structure. Tricuspid valve regurgitation is not demonstrated. No evidence of tricuspid stenosis. Aortic Valve: The aortic valve is normal in structure. Aortic valve regurgitation is not visualized. Aortic valve sclerosis is present, with no evidence of aortic valve stenosis. Pulmonic Valve: The pulmonic valve was normal in structure. Pulmonic valve regurgitation is not visualized. No evidence of pulmonic stenosis. Aorta: The aortic root is normal in size and structure. Venous: The inferior vena cava is normal in size with greater than 50% respiratory variability, suggesting right atrial pressure of 3 mmHg. IAS/Shunts: No atrial level shunt detected by color flow Doppler.  LEFT VENTRICLE PLAX 2D LVIDd:         5.50 cm   Diastology LVIDs:         3.50 cm   LV e' medial:    7.18 cm/s LV PW:         0.90 cm   LV E/e' medial:  14.2 LV IVS:        1.00 cm   LV e' lateral:   8.38 cm/s LVOT diam:     2.40 cm   LV E/e' lateral: 12.2 LVOT Area:  4.52 cm  RIGHT VENTRICLE RV Basal diam:  3.10 cm RV Mid diam:    1.90 cm RV S prime:     13.70 cm/s LEFT ATRIUM             Index        RIGHT ATRIUM           Index LA diam:        3.60 cm 1.83 cm/m   RA Area:     19.40 cm LA Vol (A2C):   58.7 ml 29.82 ml/m  RA Volume:   55.00 ml  27.94 ml/m LA Vol (A4C):   44.7 ml 22.71 ml/m LA Biplane Vol: 51.0 ml 25.91 ml/m   AORTA Ao Root diam: 3.50 cm Ao Asc diam:  3.20 cm MITRAL VALVE MV Area (PHT): 4.10 cm     SHUNTS MV Decel Time: 185 msec     Systemic Diam: 2.40 cm MV E velocity: 102.00 cm/s MV A velocity: 116.00 cm/s MV E/A ratio:  0.88 Kardie Tobb DO Electronically signed by Dub Huntsman DO Signature Date/Time: 12/31/2023/1:33:16 PM    Final    CT ANGIO  HEAD NECK W WO CM W PERF (CODE STROKE) Result Date: 12/30/2023 EXAM: CTA Head and Neck with Perfusion 12/30/2023 10:04:03 PM TECHNIQUE: CTA of the head and neck was performed with the administration of intravenous contrast. 3D postprocessing with multiplanar reconstructions and MIPs was performed to evaluate the vascular anatomy. Cerebral perfusion analysis using computed tomography with contrast administration, including post-processing of parametric maps with determination of cerebral blood flow, cerebral blood volume, mean transit time and time-to-maximum. Automated exposure control, iterative reconstruction, and/or weight based adjustment of the mA/kV was utilized to reduce the radiation dose to as low as reasonably achievable. COMPARISON: None available CLINICAL HISTORY: Neuro deficit, acute, stroke suspected. Code stroke, aphasia and slurred speech; Dr. Michaela FINDINGS: AORTIC ARCH AND ARCH VESSELS: No dissection or arterial injury. No significant stenosis of the brachiocephalic or subclavian arteries. CERVICAL CAROTID ARTERIES: Approximately 40% of the right ICA proximally. CERVICAL VERTEBRAL ARTERIES: No dissection, arterial injury, or significant stenosis. LUNGS AND MEDIASTINUM: Unremarkable. SOFT TISSUES: No acute abnormality. BONES: No acute abnormality. ANTERIOR CIRCULATION: No significant stenosis of the internal carotid arteries. No significant stenosis of the anterior cerebral arteries. No significant stenosis of the middle cerebral arteries. No aneurysm. POSTERIOR CIRCULATION: No significant stenosis of the posterior cerebral arteries. No significant stenosis of the basilar artery. No significant stenosis of the vertebral arteries. No aneurysm. OTHER: No dural venous sinus thrombosis on this non-dedicated study. EXAM QUALITY: Exam quality is adequate with diagnostic perfusion maps. No significant motion artifact. Appropriate arterial inflow and venous outflow curves. CORE INFARCT (CBF<30%  volume): 0 mL TOTAL HYPOPERFUSION (Tmax>6s volume): 10 mL Mismatch volume: 10  mL Location: High left partietal convexity and left temporal bone. IMPRESSION: 1. No acute large vessel occlusion. 2. Approximately 40% of the right proximal ICA. 3. No convincing infarct or penumbra. Reported mismatch is favored artifactual. MRI could better assess for acute infarct if clinically warranted. Electronically signed by: Gilmore Molt MD 12/30/2023 10:32 PM EDT RP Workstation: [id number]   DG Chest Portable 1 View Result Date: 12/30/2023 EXAM: 1 VIEW(S) XRAY OF THE CHEST 12/30/2023 10:24:00 PM COMPARISON: Chest x-ray 07/10/2023. CLINICAL HISTORY: ams workup. ams FINDINGS: LUNGS AND PLEURA: Increased central interstitial markings bilaterally. Increased interstitial opacities in the left lung base. No pulmonary edema. No pleural effusion. No pneumothorax. HEART AND MEDIASTINUM: No acute abnormality of the cardiac and mediastinal silhouettes.  BONES AND SOFT TISSUES: No acute osseous abnormality. IMPRESSION: 1. Increased central interstitial markings bilaterally and increased interstitial opacities in the left lung base. Findings may be related to mild edema or an infectious/inflammatory process. Electronically signed by: Greig Pique MD 12/30/2023 10:29 PM EDT RP Workstation: [id number]   CT HEAD CODE STROKE WO CONTRAST Result Date: 12/30/2023 EXAM: CT HEAD WITHOUT 12/30/2023 09:50:34 PM TECHNIQUE: CT of the head was performed without the administration of intravenous contrast. Automated exposure control, iterative reconstruction, and/or weight based adjustment of the mA/kV was utilized to reduce the radiation dose to as low as reasonably achievable. COMPARISON: None available. CLINICAL HISTORY: Neuro deficit, acute, stroke suspected. Dysarthria FINDINGS: BRAIN AND VENTRICLES: No acute intracranial hemorrhage. No mass effect or midline shift. No extra-axial fluid collection. No evidence of acute infarct. No  hydrocephalus. White matter changes, compatible with chronic microvascular ischemic change. Motion limited study. ORBITS: No acute abnormality. SINUSES AND MASTOIDS: No acute abnormality. SOFT TISSUES AND SKULL: No acute skull fracture. No acute soft tissue abnormality. Other: Findings conveyed to Dr. Michaela via pager at 10:04 PM. . IMPRESSION: 1. Motion-limited study without evidence of acute intracranial abnormality. ASPECTS 10. Electronically signed by: Gilmore Molt MD 12/30/2023 10:05 PM EDT RP Workstation: [id number]     Signature  -   Lavada Stank M.D on 01/01/2024 at 10:15 AM   -  To page go to [email or web address]

## 2024-01-02 DIAGNOSIS — J9602 Acute respiratory failure with hypercapnia: Secondary | ICD-10-CM | POA: Diagnosis not present

## 2024-01-02 DIAGNOSIS — J9601 Acute respiratory failure with hypoxia: Secondary | ICD-10-CM | POA: Diagnosis not present

## 2024-01-02 LAB — C-REACTIVE PROTEIN: CRP: 1.4 mg/dL — ABNORMAL HIGH (ref <1.0)

## 2024-01-02 LAB — CBC WITH DIFFERENTIAL/PLATELET
Abs Immature Granulocytes: 0.1 K/uL — ABNORMAL HIGH (ref 0.00–0.07)
Basophils Absolute: 0.1 K/uL (ref 0.0–0.1)
Basophils Relative: 1 %
Eosinophils Absolute: 0.1 K/uL (ref 0.0–0.5)
Eosinophils Relative: 1 %
HCT: 41.7 % (ref 39.0–52.0)
Hemoglobin: 13.4 g/dL (ref 13.0–17.0)
Immature Granulocytes: 1 %
Lymphocytes Relative: 27 %
Lymphs Abs: 4.8 K/uL — ABNORMAL HIGH (ref 0.7–4.0)
MCH: 31.8 pg (ref 26.0–34.0)
MCHC: 32.1 g/dL (ref 30.0–36.0)
MCV: 99 fL (ref 80.0–100.0)
Monocytes Absolute: 1.8 K/uL — ABNORMAL HIGH (ref 0.1–1.0)
Monocytes Relative: 10 %
Neutro Abs: 10.9 K/uL — ABNORMAL HIGH (ref 1.7–7.7)
Neutrophils Relative %: 60 %
Platelets: 243 K/uL (ref 150–400)
RBC: 4.21 MIL/uL — ABNORMAL LOW (ref 4.22–5.81)
RDW: 16.4 % — ABNORMAL HIGH (ref 11.5–15.5)
Smear Review: NORMAL
WBC: 17.8 K/uL — ABNORMAL HIGH (ref 4.0–10.5)
nRBC: 0.2 % (ref 0.0–0.2)

## 2024-01-02 LAB — GLUCOSE, CAPILLARY
Glucose-Capillary: 110 mg/dL — ABNORMAL HIGH (ref 70–99)
Glucose-Capillary: 112 mg/dL — ABNORMAL HIGH (ref 70–99)
Glucose-Capillary: 121 mg/dL — ABNORMAL HIGH (ref 70–99)
Glucose-Capillary: 171 mg/dL — ABNORMAL HIGH (ref 70–99)
Glucose-Capillary: 68 mg/dL — ABNORMAL LOW (ref 70–99)
Glucose-Capillary: 82 mg/dL (ref 70–99)
Glucose-Capillary: 89 mg/dL (ref 70–99)
Glucose-Capillary: 95 mg/dL (ref 70–99)

## 2024-01-02 LAB — BASIC METABOLIC PANEL WITH GFR
Anion gap: 8 (ref 5–15)
BUN: 22 mg/dL (ref 8–23)
CO2: 38 mmol/L — ABNORMAL HIGH (ref 22–32)
Calcium: 9 mg/dL (ref 8.9–10.3)
Chloride: 93 mmol/L — ABNORMAL LOW (ref 98–111)
Creatinine, Ser: 0.91 mg/dL (ref 0.61–1.24)
GFR, Estimated: >60 mL/min (ref >60)
Glucose, Bld: 93 mg/dL (ref 70–99)
Potassium: 3.5 mmol/L (ref 3.5–5.1)
Sodium: 139 mmol/L (ref 135–145)

## 2024-01-02 LAB — PROCALCITONIN: Procalcitonin: 0.1 ng/mL

## 2024-01-02 LAB — MAGNESIUM: Magnesium: 1.7 mg/dL (ref 1.7–2.4)

## 2024-01-02 MED ORDER — AZITHROMYCIN 500 MG PO TABS
500.0000 mg | ORAL_TABLET | Freq: Every day | ORAL | Status: AC
  Administered 2024-01-03 – 2024-01-04 (×2): 500 mg via ORAL
  Filled 2024-01-02 (×2): qty 1

## 2024-01-02 MED ORDER — FUROSEMIDE 20 MG PO TABS
20.0000 mg | ORAL_TABLET | Freq: Every day | ORAL | Status: DC
  Administered 2024-01-02 – 2024-01-04 (×3): 20 mg via ORAL
  Filled 2024-01-02 (×3): qty 1

## 2024-01-02 MED ORDER — POTASSIUM CHLORIDE CRYS ER 20 MEQ PO TBCR
40.0000 meq | EXTENDED_RELEASE_TABLET | Freq: Once | ORAL | Status: AC
  Administered 2024-01-02: 40 meq via ORAL
  Filled 2024-01-02: qty 2

## 2024-01-02 NOTE — Progress Notes (Signed)
 Physical Therapy Treatment Patient Details Name: Nathan Nicholson MRN: [id number] DOB: Jul 30, 1955 Today's Date: 01/02/2024   History of Present Illness 68 y.o. M presenting initially to the ED on 10/26 for increased lethargy and slurred speech.  CT head/MRI unremarkable. Pt with acute respiratory failure with hypercarbia and placed on bipap. Pt with PNA and metabolic encephalopathy. PMH of COPD, CAD s/p PCI, HTN, rheumatoid arthritis   PT Comments  Pt received in recliner and agreeable to PT session. Pt wanted to focus on gait training in today's session. Pt was supervision for all mobility with ability to ambulate 152ft with rollator. Frequent cueing to engage rollator brakes prior to sit<>stand transitions. Pt met all acute PT goals with no further acute PT needs. Recommend pt continue to ambulate and mobilize with mobility specialists/staff. Acute PT signing off. Please re-consult if new needs arise.    If plan is discharge home, recommend the following: A little help with walking and/or transfers;A little help with bathing/dressing/bathroom;Assistance with cooking/housework;Assist for transportation;Help with stairs or ramp for entrance   Can travel by private vehicle      Yes  Equipment Recommendations  None recommended by PT       Precautions / Restrictions Precautions Precautions: Fall Recall of Precautions/Restrictions: Intact Restrictions Weight Bearing Restrictions Per Provider Order: No     Mobility  Bed Mobility Overal bed mobility: Needs Assistance Bed Mobility: Sit to Supine    Sit to supine: Supervision   General bed mobility comments: for safety    Transfers Overall transfer level: Needs assistance Equipment used: Rollator (4 wheels) Transfers: Sit to/from Stand Sit to Stand: Supervision    General transfer comment: for safety; cues for locking rollator brakes before sitting    Ambulation/Gait Ambulation/Gait assistance: Supervision Gait Distance (Feet):  160 Feet Assistive device: Rollator (4 wheels) Gait Pattern/deviations: Step-through pattern, Decreased step length - right, Decreased step length - left, Shuffle, Trunk flexed Gait velocity: decr     General Gait Details: no physical assist, supervision for line management      Balance Overall balance assessment: Needs assistance Sitting-balance support: No upper extremity supported, Feet supported Sitting balance-Leahy Scale: Fair     Standing balance support: No upper extremity supported, During functional activity, Single extremity supported, Bilateral upper extremity supported Standing balance-Leahy Scale: Poor Standing balance comment: Preferred UE support     Communication Communication Communication: No apparent difficulties  Cognition Arousal: Alert Behavior During Therapy: WFL for tasks assessed/performed, Anxious   PT - Cognitive impairments: No apparent impairments    Following commands: Intact      Cueing Cueing Techniques: Verbal cues     General Comments General comments (skin integrity, edema, etc.): pt on 2.5 L supplementary O2 via [place] at rest; needing up to 3 during ambulation      Pertinent Vitals/Pain Pain Assessment Pain Assessment: No/denies pain     PT Goals (current goals can now be found in the care plan section) Acute Rehab PT Goals PT Goal Formulation: With patient Time For Goal Achievement: 01/14/24 Potential to Achieve Goals: Good Progress towards PT goals: Goals met/education completed, patient discharged from PT    Frequency    Min 2X/week       AM-PAC PT 6 Clicks Mobility   Outcome Measure  Help needed turning from your back to your side while in a flat bed without using bedrails?: A Little Help needed moving from lying on your back to sitting on the side of a flat bed without using bedrails?: A  Little Help needed moving to and from a bed to a chair (including a wheelchair)?: A Little Help needed standing up from a chair  using your arms (e.g., wheelchair or bedside chair)?: A Little Help needed to walk in hospital room?: A Little Help needed climbing 3-5 steps with a railing? : A Little 6 Click Score: 18    End of Session Equipment Utilized During Treatment: Oxygen  Activity Tolerance: Patient tolerated treatment well Patient left: in bed;with call bell/phone within reach;with family/visitor present Nurse Communication: Mobility status PT Visit Diagnosis: Other abnormalities of gait and mobility (R26.89);Muscle weakness (generalized) (M62.81);Difficulty in walking, not elsewhere classified (R26.2)     Time: 8376-8361 PT Time Calculation (min) (ACUTE ONLY): 15 min  Charges:    $Gait Training: 8-22 mins PT General Charges $$ ACUTE PT VISIT: 1 Visit                    Kate ORN, PT, DPT Secure Chat Preferred  Rehab Office [phone]   Kate BRAVO Wendolyn 01/02/2024, 4:58 PM

## 2024-01-02 NOTE — TOC Progression Note (Addendum)
 Transition of Care [facility]) - Progression Note    Patient Details  Name: Nathan Nicholson MRN: [id number] Date of Birth: Jun 02, 1955  [facility] CM/SW Contact  Marval Gell, RN Phone Number: 01/02/2024, 9:40 AM  Clinical Narrative:     Beatris lelon Rattan of [employer] about status of BiPAP. Waiting on overnight oxymetry results (ONO). Spoke w [place] RT who will get results printed and chat me when done so I can send to [place].   11:50 scanned ONO to [facility]   Expected Discharge Plan: Home w Home Health Services Barriers to Discharge: Continued Medical Work up               Expected Discharge Plan and Services   Discharge Planning Services: CM Consult   Living arrangements for the past 2 months: Single Family Home                 DME Arranged: NIV, Bipap DME Agency: [facility] Date DME Agency Contacted: 01/01/24 Time DME Agency Contacted: [id number] Representative spoke with at DME Agency: Rattan [phone] HH Arranged: PT, OT, RN [facility] Agency: [facility] Date [facility] Agency Contacted: 01/01/24 Time HH Agency Contacted: [phone] Representative spoke with at [facility] Agency: Amy   Social Drivers of Health (SDOH) Interventions SDOH Screenings   Food Insecurity: No Food Insecurity (12/31/2023)  Housing: Low Risk  (12/31/2023)  Transportation Needs: No Transportation Needs (12/31/2023)  Utilities: Not At Risk (12/31/2023)  Alcohol Screen: Low Risk  (08/10/2022)  Depression (PHQ2-9): Low Risk  (08/10/2022)  Financial Resource Strain: Low Risk  (08/10/2022)  Physical Activity: Insufficiently Active (08/10/2022)  Social Connections: Socially Isolated (12/31/2023)  Stress: No Stress Concern Present (07/27/2021)  Tobacco Use: High Risk (12/30/2023)    Readmission Risk Interventions     No data to display

## 2024-01-02 NOTE — Progress Notes (Signed)
 Speech Language Pathology Treatment: Dysphagia  Patient Details Name: Nathan Nicholson MRN: [id number] DOB: February 23, 1956 Today's Date: 01/02/2024 Time: 0820-0830 SLP Time Calculation (min) (ACUTE ONLY): 10 min  Assessment / Plan / Recommendation Clinical Impression  Pt just completed nebulizer treatment o SLP arrival and noted to cough once prior to po's. Observed with graham cracker with timely mastication and clearance. States little harder to chew without dentures but can eat without them and doesn't want to have them brought to hospital. Sips thin via straw consumed. There were no immediate s/s aspiration but he did have one delayed cough. He denied coughing with po's since yesterday. Discussed relationship between COPD, higher aspiration risk (usually silent), cough noted today, yesterday and current pna. Educated re: recommendation of MBS which would assess oropharyngeal swallow and determine airway integrity. Pt politely declined MBS stating he is not concerned with his swallow. Yesterday he stated he coughs after eating which could involve esophagus. He states he does not get short of breath when eating, however educated him to take rest breaks when eating and remain upright. Discussed if he is admitted in the future with pna, MBS would be again be warranted. Continue regular texture, thin liquids. ST will sign off.   HPI HPI: 68 y.o. M presenting initially to the ED on 10/26 for increased lethargy and slurred speech.  CT head without acute findings and MRI no acute intracranial abnormality, mild chronic microvascular ischemic disease with a few small remote lacunar infarcts about the right basal ganglia and right thalamus, Pt with acute respiratory failure with hypercarbia and placed on bipap. Pt with PNA and metabolic encephalopathy. PMH of COPD, CAD s/p PCI, HTN, rheumatoid arthritis.      SLP Plan  All goals met;Discharge SLP treatment due to (comment)          Recommendations   Diet recommendations: Regular;Thin liquid Liquids provided via: Cup;Straw Medication Administration: Whole meds with liquid Supervision: Patient able to self feed Compensations: Slow rate;Small sips/bites Postural Changes and/or Swallow Maneuvers: Seated upright 90 degrees;Upright 30-60 min after meal                  Oral care BID     Dysphagia, unspecified (R13.10)     All goals met;Discharge SLP treatment due to (comment)     Dustin Olam Bull  01/02/2024, 8:34 AM

## 2024-01-02 NOTE — Progress Notes (Signed)
 PROGRESS NOTE                                                                                                                                                                                                             Patient Demographics:    Nathan Nicholson, is a 68 y.o. male, DOB - October 05, 1955, FMW:[id number]  Outpatient Primary MD for the patient is Pandora Therisa RAMAN, NP    LOS - 2  Admit date - 12/30/2023    Chief Complaint  Patient presents with   Code Stroke       Brief Narrative (HPI from H&P)      -68 y.o. male with PMH of f COPD, CAD status post PCI in 2023, hypertension, rheumatoid arthritis was brought to the ED for increasing lethargic sleepy and also having slurred speech.at baseline is largely bedbound but able to usually hold on a conversation.  Was found to be lethargic in ED, CT head with no acute finding, CTA head and neck with no LVO, found with evidence of CO2 retention pH of 7.26 pCO2 of 90.  WBC count is 19.9.  Chest x-ray shows possible infiltrates.  Given the hypercarbic respiratory failure patient is placed on BiPAP and started on IV antibiotics for pneumonia with acute encephalopathy and acute on chronic hypercarbic and hypoxic respiratory failure.   Subjective:    Nathan Nicholson today was compliant with BiPAP overnight, he still reports some cough.     Assessment  & Plan :   Acute respiratory failure with acute hypercarbia and acute on chronic hypoxia, on 2 L nasal cannula oxygen  at home, not on CPAP at night, does have clinical evidence of pneumonia, also uses narcotics at home and likely has undiagnosed OHS and OSA as well.  Metabolic encephalopathy   Does have pneumonia upon admission, did have acute hypercarbic and hypoxic respiratory failure in the ER, likely some element of undiagnosed OSA OHS, currently on nighttime BiPAP, nasal cannula oxygen  and daytime, antibiotics, nebulizer treatments,  mentation and respiratory status much improved.  Continue treating for pneumonia nighttime BiPAP, outpatient sleep study will need to be discharged on BiPAP nighttime.   - Will need BiPAP on discharge  - Diurese as needed . - Pulmonary input greatly appreciated, will need to follow-up with primary pulmonary [facility] as an outpatient .    Community-acquired pneumonia:   -  Continue with azithromycin  and Rocephin   - Courage to use incentive spirometry a flutter valve     Acute metabolic encephalopathy:  Due to above, CT and MRI brain unremarkable, case discussed with neurology nothing else to offer.  Stable TSH and ammonia.   Mild acute on chronic diastolic CHF EF 60%.  Trial of Lasix and monitor.    CAD status post PCI in 2023:  Serial troponin negative x 2.  Continue aspirin  per rectally for now.   Carotid artery disease.  On CTA noted to have >> Approximately 40% of the right proximal ICA, resume DAPT, continue home dose statin.  Outpatient vascular surgery follow-up postdischarge.  Hypertension   Stable   Macrocytosis:  Monitor hb.   History of rheumatoid arthritis  per the chart.   Obesity.  BMI 36.  Follow-up with PCP.    Diabetes mellitus type 2:  A1c stable, cbg stable.  Continue SSI         Condition - Extremely Guarded  Family Communication  : None at bedside   Code Status :  Full  Consults  :   PCCM, Neuro  PUD Prophylaxis :     Procedures  :     EEG  TTE  1. Left ventricular ejection fraction, by estimation, is 55 to 60%. The left ventricle has normal function. The left ventricle has no regional wall motion abnormalities. Left ventricular diastolic parameters are consistent with Grade I diastolic dysfunction (impaired relaxation).  2. Right ventricular systolic function is normal. The right ventricular size is normal.  3. The mitral valve is normal in structure. Trivial mitral valve regurgitation. No evidence of mitral stenosis.  4. The aortic valve is normal in  structure. Aortic valve regurgitation is not visualized. Aortic valve sclerosis is present, with no evidence of aortic valve stenosis.  5. The inferior vena cava is normal in size with greater than 50% respiratory variability, suggesting right atrial pressure of 3 mmHg.   MRI - 1. Motion degraded exam. 2. No acute intracranial abnormality. 3. Mild chronic microvascular ischemic disease with a few small remote lacunar infarcts about the right basal ganglia and right thalamus  CTA  1. No acute large vessel occlusion. 2. Approximately 40% of the right proximal ICA. 3. No convincing infarct or penumbra      Disposition Plan  :    Status is: Inpatient   DVT Prophylaxis  :  Lovenox     Lab Results  Component Value Date   PLT 243 01/02/2024    Diet :  Diet Order             Diet regular Room service appropriate? Yes with Assist; Fluid consistency: Thin  Diet effective now                    Inpatient Medications  Scheduled Meds:  arformoterol  15 mcg Nebulization BID   atorvastatin   80 mg Oral QHS   budesonide  (PULMICORT ) nebulizer solution  0.5 mg Nebulization BID   clopidogrel  75 mg Oral Daily   cyanocobalamin   1,000 mcg Intramuscular Q30 days   enoxaparin  (LOVENOX ) injection  45 mg Subcutaneous Q24H   folic acid   1 mg Oral q morning   Gerhardt's butt cream   Topical BID   insulin  aspart  0-6 Units Subcutaneous TID WC   revefenacin  175 mcg Nebulization Daily   Continuous Infusions:  azithromycin  Stopped (01/02/24 0701)   cefTRIAXone  (ROCEPHIN )  IV Stopped (01/02/24 0701)   PRN Meds:.acetaminophen  **  OR** acetaminophen  (TYLENOL ) oral liquid 160 mg/5 mL **OR** acetaminophen , albuterol , nitroGLYCERIN    Objective:   Vitals:   01/02/24 0014 01/02/24 0500 01/02/24 0747 01/02/24 0807  BP: 124/74 110/66 122/77   Pulse: 66 62 75   Resp:      Temp: 98.4 F (36.9 C) (!) 97 F (36.1 C) 98.3 F (36.8 C)   TempSrc: Axillary Axillary Oral   SpO2:   91% 93%  Weight:       Height:        Wt Readings from Last 3 Encounters:  12/30/23 94.6 kg  10/23/23 92.5 kg  08/15/23 92.1 kg     Intake/Output Summary (Last 24 hours) at 01/02/2024 1207 Last data filed at 01/02/2024 0900 Gross per 24 hour  Intake 240 ml  Output 1050 ml  Net -810 ml     Physical Exam    Awake Alert, Oriented X 3, No new F.N deficits, Normal affect Symmetrical Chest wall movement, Good air movement bilaterally RRR,No Gallops,Rubs or new Murmurs, No Parasternal Heave +ve B.Sounds, Abd Soft, No tenderness, No rebound - guarding or rigidity. No Cyanosis, Clubbing or edema, No new Rash or bruise        Data Review:    Recent Labs  Lab 12/30/23 2152 12/31/23 0219 12/31/23 0352 12/31/23 0753 01/01/24 0349 01/02/24 0426  WBC 19.9*  --  20.6*  --  21.2* 17.8*  HGB 15.4 15.6 15.0 15.3 12.3* 13.4  HCT 51.1 46.0 51.6 45.0 38.6* 41.7  PLT PLATELET CLUMPS NOTED ON SMEAR, COUNT APPEARS ADEQUATE  --  285  --  214 243  MCV 106.7*  --  109.1*  --  100.0 99.0  MCH 32.2  --  31.7  --  31.9 31.8  MCHC 30.1  --  29.1*  --  31.9 32.1  RDW 16.7*  --  16.5*  --  15.9* 16.4*  LYMPHSABS 3.2  --   --   --   --  4.8*  MONOABS 1.9*  --   --   --   --  1.8*  EOSABS 0.1  --   --   --   --  0.1  BASOSABS 0.1  --   --   --   --  0.1    Recent Labs  Lab 12/30/23 2152 12/30/23 2303 12/31/23 0219 12/31/23 0352 12/31/23 0553 12/31/23 0753 01/01/24 0349 01/01/24 0641 01/02/24 0426  NA 142  --  139 142  --  144 139  --  139  K 4.8  --  4.3 4.8  --  4.7 3.7  --  3.5  CL 96*  --   --  98  --   --  97*  --  93*  CO2 34*  --   --  29  --   --  33*  --  38*  ANIONGAP 12  --   --  15  --   --  9  --  8  GLUCOSE 106*  --   --  103*  --   --  111*  --  93  BUN 13  --   --  13  --   --  22  --  22  CREATININE 0.98  --   --  0.81  --   --  0.81  --  0.91  AST 29  --   --  27  --   --   --   --   --   ALT  30  --   --  29  --   --   --   --   --   ALKPHOS 129*  --   --  127*  --   --   --   --    --   BILITOT 0.7  --   --  0.5  --   --   --   --   --   ALBUMIN 2.8*  --   --  2.7*  --   --   --   --   --   CRP  --   --   --   --   --   --   --  2.8* 1.4*  PROCALCITON  --   --   --   --   --   --   --  <0.10 0.10  INR  --  SPECIMEN CLOTTED  --  1.0  --   --   --   --   --   TSH  --   --   --   --  0.441  --   --  0.378  --   HGBA1C  --   --   --  6.3*  --   --   --   --   --   AMMONIA  --   --   --   --  47*  --   --   --   --   MG  --   --   --   --   --   --   --   --  1.7  CALCIUM  9.1  --   --  9.1  --   --  8.5*  --  9.0      Recent Labs  Lab 12/30/23 2152 12/30/23 2303 12/31/23 0352 12/31/23 0553 01/01/24 0349 01/01/24 0641 01/02/24 0426  CRP  --   --   --   --   --  2.8* 1.4*  PROCALCITON  --   --   --   --   --  <0.10 0.10  INR  --  SPECIMEN CLOTTED 1.0  --   --   --   --   TSH  --   --   --  0.441  --  0.378  --   HGBA1C  --   --  6.3*  --   --   --   --   AMMONIA  --   --   --  47*  --   --   --   MG  --   --   --   --   --   --  1.7  CALCIUM  9.1  --  9.1  --  8.5*  --  9.0   Lab Results  Component Value Date   CHOL 114 12/31/2023   HDL 35 (L) 12/31/2023   LDLCALC 65 12/31/2023   TRIG 72 12/31/2023   CHOLHDL 3.3 12/31/2023    Lab Results  Component Value Date   HGBA1C 6.3 (H) 12/31/2023   Recent Labs    01/01/24 0349 01/01/24 0641  TSH  --  0.378  FREET4 0.75  --     Micro Results Recent Results (from the past 240 hours)  Blood culture (routine x 2)     Status: None (Preliminary result)   Collection Time: 12/30/23 11:03 PM   Specimen: BLOOD RIGHT HAND  Result Value Ref Range Status   Specimen  Description BLOOD RIGHT HAND  Final   Special Requests   Final    BOTTLES DRAWN AEROBIC AND ANAEROBIC Blood Culture results may not be optimal due to an inadequate volume of blood received in culture bottles   Culture   Final    NO GROWTH 3 DAYS Performed at [facility], [address], [place], [place] [address]    Report Status PENDING   Incomplete  Blood culture (routine x 2)     Status: None (Preliminary result)   Collection Time: 12/30/23 11:03 PM   Specimen: BLOOD LEFT ARM  Result Value Ref Range Status   Specimen Description BLOOD LEFT ARM  Final   Special Requests   Final    BOTTLES DRAWN AEROBIC AND ANAEROBIC Blood Culture results may not be optimal due to an inadequate volume of blood received in culture bottles   Culture   Final    NO GROWTH 3 DAYS Performed at [facility], [address] [place], [place] [address]    Report Status PENDING  Incomplete  MRSA Next Gen by PCR, Nasal     Status: None   Collection Time: 01/01/24  5:57 AM   Specimen: Nasal Mucosa; Nasal Swab  Result Value Ref Range Status   MRSA by PCR Next Gen NOT DETECTED NOT DETECTED Final    Comment: (NOTE) The GeneXpert MRSA Assay (FDA approved for NASAL specimens only), is one component of a comprehensive MRSA colonization surveillance program. It is not intended to diagnose MRSA infection nor to guide or monitor treatment for MRSA infections. Test performance is not FDA approved in patients less than 78 years old. Performed at [facility], [address] [place], [place] [address]     Radiology Report EEG adult Result Date: 01/01/2024 Shelton Arlin KIDD, MD     01/01/2024  2:02 PM Patient Name: Nathan Nicholson MRN: [id number] Epilepsy Attending: Arlin KIDD Shelton Referring Physician/Provider: Dennise Lavada POUR, MD Date: 01/01/2024 Duration: 23.50 mins Patient history: 68yo M with ams. EEG to evaluate for seizure Level of alertness: Awake AEDs during EEG study: None Technical aspects: This EEG study was done with scalp electrodes positioned according to the 10-20 International system of electrode placement. Electrical activity was reviewed with band pass filter of 1-70Hz , sensitivity of 7 uV/mm, display speed of 30mm/sec with a 60Hz  notched filter applied as appropriate. EEG data were recorded continuously and digitally stored.   Video monitoring was available and reviewed as appropriate. Description: The posterior dominant rhythm consists of 8 Hz activity of moderate voltage (25-35 uV) seen predominantly in posterior head regions, symmetric and reactive to eye opening and eye closing. Hyperventilation and photic stimulation were not performed.   IMPRESSION: This study is within normal limits. No seizures or epileptiform discharges were seen throughout the recording. A normal interictal EEG does not exclude the diagnosis of epilepsy. Arlin KIDD Shelton   DG Chest Port 1 View Result Date: 01/01/2024 EXAM: 1 VIEW(S) XRAY OF THE CHEST 01/01/2024 06:48:00 AM COMPARISON: Portable chest 12/30/2023. CLINICAL HISTORY: SOB (shortness of breath); Pt has hx of COPD and htn. FINDINGS: LUNGS AND PLEURA: The lungs are mildly emphysematous. There is decreased inspiration today with increased patchy opacity in the hypoexpanded left base, which could represent asymmetric atelectasis or consolidation. The remaining lungs appear generally clear. No pulmonary edema. No substantial pleural effusion. No pneumothorax. HEART AND MEDIASTINUM: There is mild cardiomegaly. No vascular congestion is seen. The mediastinum is stable. BONES AND SOFT TISSUES: No  acute osseous abnormality. IMPRESSION: 1. Increased patchy opacity in the hypoexpanded left base, possibly representing asymmetric atelectasis or consolidation. Electronically signed by: Francis Quam MD 01/01/2024 07:04 AM EDT RP Workstation: [id number]   MR BRAIN WO CONTRAST Result Date: 12/31/2023 EXAM: MRI BRAIN WITHOUT CONTRAST 12/31/2023 10:38:21 PM TECHNIQUE: Multiplanar multisequence MRI of the head/brain was performed without the administration of intravenous contrast. COMPARISON: Comparison with prior CT from 12/30/2023. CLINICAL HISTORY: FINDINGS: LIMITATIONS: Examination severely degraded by motion artifact. BRAIN AND VENTRICLES: No acute infarct. No intracranial hemorrhage. No mass. No midline  shift. No hydrocephalus. T2/FLAIR hypertensity involving the periventricular white matter, most likely related to chronic small vessel ischemic disease, mild in nature. Few small remote lacunar infarcts noted about the right basal ganglia and right thalamus. The sella is unremarkable. Normal flow voids. ORBITS: No acute abnormality. SINUSES AND MASTOIDS: No acute abnormality. BONES AND SOFT TISSUES: Normal marrow signal. No acute soft tissue abnormality. Plagiocephaly noted. IMPRESSION: 1. Motion degraded exam. 2. No acute intracranial abnormality. 3. Mild chronic microvascular ischemic disease with a few small remote lacunar infarcts about the right basal ganglia and right thalamus. Electronically signed by: Morene Hoard MD 12/31/2023 11:16 PM EDT RP Workstation: [id number]   ECHOCARDIOGRAM COMPLETE Result Date: 12/31/2023    ECHOCARDIOGRAM REPORT   Patient Name:   Nathan Nicholson Date of Exam: 12/31/2023 Medical Rec #:  [id number]        Height:       63.0 in Accession #:    [id number]       Weight:       208.6 lb Date of Birth:  [birth year]        BSA:          1.968 m Patient Age:    68 years         BP:           110/78 mmHg Patient Gender: M                HR:           83 bpm. Exam Location:  Inpatient Procedure: 2D Echo and Intracardiac Opacification Agent (Both Spectral and Color            Flow Doppler were utilized during procedure). Indications:    Dyspnea  History:        Patient has prior history of Echocardiogram examinations. CHF,                 CAD, COPD; Risk Factors:Hypertension.  Sonographer:    Charmaine Gaskins Referring Phys: [phone] REDIA LOISE CLEAVER  Sonographer Comments: Technically challenging study due to limited acoustic windows and Technically difficult study due to poor echo windows. Image acquisition challenging due to patient body habitus, Image acquisition challenging due to COPD and Image acquisition challenging due to respiratory motion. IMPRESSIONS  1. Left ventricular  ejection fraction, by estimation, is 55 to 60%. The left ventricle has normal function. The left ventricle has no regional wall motion abnormalities. Left ventricular diastolic parameters are consistent with Grade I diastolic dysfunction (impaired relaxation).  2. Right ventricular systolic function is normal. The right ventricular size is normal.  3. The mitral valve is normal in structure. Trivial mitral valve regurgitation. No evidence of mitral stenosis.  4. The aortic valve is normal in structure. Aortic valve regurgitation is not visualized. Aortic valve sclerosis is present, with no evidence of aortic valve stenosis.  5. The inferior vena cava is normal in size with greater than 50%  respiratory variability, suggesting right atrial pressure of 3 mmHg. FINDINGS  Left Ventricle: Left ventricular ejection fraction, by estimation, is 55 to 60%. The left ventricle has normal function. The left ventricle has no regional wall motion abnormalities. Definity  contrast agent was given IV to delineate the left ventricular  endocardial borders. The left ventricular internal cavity size was normal in size. There is no left ventricular hypertrophy. Left ventricular diastolic parameters are consistent with Grade I diastolic dysfunction (impaired relaxation). Right Ventricle: The right ventricular size is normal. No increase in right ventricular wall thickness. Right ventricular systolic function is normal. Left Atrium: Left atrial size was normal in size. Right Atrium: Right atrial size was normal in size. Pericardium: There is no evidence of pericardial effusion. Presence of epicardial fat layer. Mitral Valve: The mitral valve is normal in structure. Trivial mitral valve regurgitation. No evidence of mitral valve stenosis. Tricuspid Valve: The tricuspid valve is normal in structure. Tricuspid valve regurgitation is not demonstrated. No evidence of tricuspid stenosis. Aortic Valve: The aortic valve is normal in structure.  Aortic valve regurgitation is not visualized. Aortic valve sclerosis is present, with no evidence of aortic valve stenosis. Pulmonic Valve: The pulmonic valve was normal in structure. Pulmonic valve regurgitation is not visualized. No evidence of pulmonic stenosis. Aorta: The aortic root is normal in size and structure. Venous: The inferior vena cava is normal in size with greater than 50% respiratory variability, suggesting right atrial pressure of 3 mmHg. IAS/Shunts: No atrial level shunt detected by color flow Doppler.  LEFT VENTRICLE PLAX 2D LVIDd:         5.50 cm   Diastology LVIDs:         3.50 cm   LV e' medial:    7.18 cm/s LV PW:         0.90 cm   LV E/e' medial:  14.2 LV IVS:        1.00 cm   LV e' lateral:   8.38 cm/s LVOT diam:     2.40 cm   LV E/e' lateral: 12.2 LVOT Area:     4.52 cm  RIGHT VENTRICLE RV Basal diam:  3.10 cm RV Mid diam:    1.90 cm RV S prime:     13.70 cm/s LEFT ATRIUM             Index        RIGHT ATRIUM           Index LA diam:        3.60 cm 1.83 cm/m   RA Area:     19.40 cm LA Vol (A2C):   58.7 ml 29.82 ml/m  RA Volume:   55.00 ml  27.94 ml/m LA Vol (A4C):   44.7 ml 22.71 ml/m LA Biplane Vol: 51.0 ml 25.91 ml/m   AORTA Ao Root diam: 3.50 cm Ao Asc diam:  3.20 cm MITRAL VALVE MV Area (PHT): 4.10 cm     SHUNTS MV Decel Time: 185 msec     Systemic Diam: 2.40 cm MV E velocity: 102.00 cm/s MV A velocity: 116.00 cm/s MV E/A ratio:  0.88 Kardie Tobb DO Electronically signed by Dub Huntsman DO Signature Date/Time: 12/31/2023/1:33:16 PM    Final      Signature  GLENWOOD Brayton Lye M.D on 01/02/2024 at 12:07 PM   -  To page go to [email or web address]

## 2024-01-02 NOTE — Progress Notes (Signed)
 Occupational Therapy Treatment Patient Details Name: Nathan Nicholson MRN: [id number] DOB: June 18, 1955 Today's Date: 01/02/2024   History of present illness 68 y.o. M presenting initially to the ED on 10/26 for increased lethargy and slurred speech.  CT head without acute findings and MRI pending. Pt with acute respiratory failure with hypercarbia and placed on bipap. Pt with PNA and metabolic encephalopathy. PMH of COPD, CAD s/p PCI, HTN, rheumatoid arthritis   OT comments  Focus session on optimizing activity tolerance and challenging pt ability to self pace activity/implement rest breaks. Pt deferred performing BADL, but was agreeable to ambulation. OT providing indirect cues to facilitate pt self monitoring and pt self implemented one rest break with seated rest via rollator. Will continue to follow per POC.       If plan is discharge home, recommend the following:  A little help with walking and/or transfers;A little help with bathing/dressing/bathroom;Assistance with cooking/housework;Assist for transportation;Help with stairs or ramp for entrance   Equipment Recommendations  None recommended by OT    Recommendations for Other Services      Precautions / Restrictions Precautions Precautions: Fall Recall of Precautions/Restrictions: Intact Restrictions Weight Bearing Restrictions Per Provider Order: No       Mobility Bed Mobility               General bed mobility comments: OOB in chair    Transfers Overall transfer level: Needs assistance Equipment used: Rollator (4 wheels) Transfers: Sit to/from Stand Sit to Stand: Supervision           General transfer comment: for safety; cues for locking rollator brakes before sitting     Balance Overall balance assessment: Needs assistance Sitting-balance support: No upper extremity supported, Feet supported Sitting balance-Leahy Scale: Fair     Standing balance support: No upper extremity supported, During  functional activity, Single extremity supported, Bilateral upper extremity supported Standing balance-Leahy Scale: Poor Standing balance comment: Preferred UE support                           ADL either performed or assessed with clinical judgement   ADL Overall ADL's : Needs assistance/impaired Eating/Feeding: Independent Eating/Feeding Details (indicate cue type and reason): finishing lunch on arrival                     Toilet Transfer: Contact guard assist;Rollator (4 wheels) Toilet Transfer Details (indicate cue type and reason): approaching supervision           General ADL Comments: focus session on optimizing activity tolerance with goal of 5+ mins OOB ADL or mobility. Pt opted for ambulation in hall. demonstrating fair ability to appropriately self initiate and time a rest break    Extremity/Trunk Assessment Upper Extremity Assessment Upper Extremity Assessment: Generalized weakness   Lower Extremity Assessment Lower Extremity Assessment: Defer to PT evaluation        Vision       Perception     Praxis     Communication Communication Communication: No apparent difficulties   Cognition Arousal: Alert Behavior During Therapy: WFL for tasks assessed/performed, Anxious Cognition: No apparent impairments                               Following commands: Intact        Cueing   Cueing Techniques: Verbal cues  Exercises      Shoulder Instructions  General Comments pt on 2.5 L supplementary O2 via [place] at rest; needing up to 3 during ambulation    Pertinent Vitals/ Pain       Pain Assessment Pain Assessment: No/denies pain  Home Living                                          Prior Functioning/Environment              Frequency  Min 2X/week        Progress Toward Goals  OT Goals(current goals can now be found in the care plan section)  Progress towards OT goals: Progressing toward  goals  Acute Rehab OT Goals OT Goal Formulation: With patient Time For Goal Achievement: 01/14/24 Potential to Achieve Goals: Good ADL Goals Pt Will Perform Upper Body Dressing: with modified independence Pt Will Transfer to Toilet: with modified independence Pt Will Perform Toileting - Clothing Manipulation and hygiene: with modified independence Additional ADL Goal #1: Pt will be able to tolerate >5 minutes of OOB activity to maximize activity tolerance and safety with OOB activities  Plan      Co-evaluation                 AM-PAC OT 6 Clicks Daily Activity     Outcome Measure   Help from another person eating meals?: None Help from another person taking care of personal grooming?: A Little Help from another person toileting, which includes using toliet, bedpan, or urinal?: A Little Help from another person bathing (including washing, rinsing, drying)?: A Lot Help from another person to put on and taking off regular upper body clothing?: A Little Help from another person to put on and taking off regular lower body clothing?: A Lot 6 Click Score: 17    End of Session Equipment Utilized During Treatment: Gait belt;Rollator (4 wheels)  OT Visit Diagnosis: Unsteadiness on feet (R26.81);Other abnormalities of gait and mobility (R26.89);Muscle weakness (generalized) (M62.81)   Activity Tolerance Patient tolerated treatment well   Patient Left in bed;with call bell/phone within reach;with family/visitor present   Nurse Communication Mobility status        Time: 8694-8675 OT Time Calculation (min): 19 min  Charges: OT General Charges $OT Visit: 1 Visit OT Treatments $Therapeutic Activity: 8-22 mins  Elma JONETTA Penner, OTD, OTR/L [facility] Acute Rehabilitation Office: [phone]   Elma JONETTA Penner 01/02/2024, 1:32 PM

## 2024-01-03 DIAGNOSIS — J9601 Acute respiratory failure with hypoxia: Secondary | ICD-10-CM | POA: Diagnosis not present

## 2024-01-03 DIAGNOSIS — J9602 Acute respiratory failure with hypercapnia: Secondary | ICD-10-CM | POA: Diagnosis not present

## 2024-01-03 LAB — CBC WITH DIFFERENTIAL/PLATELET
Abs Immature Granulocytes: 0.11 K/uL — ABNORMAL HIGH (ref 0.00–0.07)
Basophils Absolute: 0.1 K/uL (ref 0.0–0.1)
Basophils Relative: 1 %
Eosinophils Absolute: 0.2 K/uL (ref 0.0–0.5)
Eosinophils Relative: 1 %
HCT: 41.4 % (ref 39.0–52.0)
Hemoglobin: 13.3 g/dL (ref 13.0–17.0)
Immature Granulocytes: 1 %
Lymphocytes Relative: 29 %
Lymphs Abs: 4.5 K/uL — ABNORMAL HIGH (ref 0.7–4.0)
MCH: 31.7 pg (ref 26.0–34.0)
MCHC: 32.1 g/dL (ref 30.0–36.0)
MCV: 98.6 fL (ref 80.0–100.0)
Monocytes Absolute: 1.8 K/uL — ABNORMAL HIGH (ref 0.1–1.0)
Monocytes Relative: 12 %
Neutro Abs: 8.9 K/uL — ABNORMAL HIGH (ref 1.7–7.7)
Neutrophils Relative %: 56 %
Platelets: 229 K/uL (ref 150–400)
RBC: 4.2 MIL/uL — ABNORMAL LOW (ref 4.22–5.81)
RDW: 16.2 % — ABNORMAL HIGH (ref 11.5–15.5)
WBC: 15.6 K/uL — ABNORMAL HIGH (ref 4.0–10.5)
nRBC: 0.2 % (ref 0.0–0.2)

## 2024-01-03 LAB — BASIC METABOLIC PANEL WITH GFR
Anion gap: 11 (ref 5–15)
BUN: 19 mg/dL (ref 8–23)
CO2: 36 mmol/L — ABNORMAL HIGH (ref 22–32)
Calcium: 8.8 mg/dL — ABNORMAL LOW (ref 8.9–10.3)
Chloride: 94 mmol/L — ABNORMAL LOW (ref 98–111)
Creatinine, Ser: 0.73 mg/dL (ref 0.61–1.24)
GFR, Estimated: >60 mL/min (ref >60)
Glucose, Bld: 100 mg/dL — ABNORMAL HIGH (ref 70–99)
Potassium: 3.6 mmol/L (ref 3.5–5.1)
Sodium: 141 mmol/L (ref 135–145)

## 2024-01-03 LAB — BRAIN NATRIURETIC PEPTIDE: B Natriuretic Peptide: 61.7 pg/mL (ref 0.0–100.0)

## 2024-01-03 LAB — PROCALCITONIN: Procalcitonin: <0.1 ng/mL

## 2024-01-03 LAB — GLUCOSE, CAPILLARY
Glucose-Capillary: 110 mg/dL — ABNORMAL HIGH (ref 70–99)
Glucose-Capillary: 97 mg/dL (ref 70–99)
Glucose-Capillary: 124 mg/dL — ABNORMAL HIGH (ref 70–99)
Glucose-Capillary: 91 mg/dL (ref 70–99)
Glucose-Capillary: 191 mg/dL — ABNORMAL HIGH (ref 70–99)

## 2024-01-03 LAB — C-REACTIVE PROTEIN: CRP: 0.8 mg/dL (ref <1.0)

## 2024-01-03 LAB — MAGNESIUM: Magnesium: 1.7 mg/dL (ref 1.7–2.4)

## 2024-01-03 MED ORDER — INSULIN ASPART 100 UNIT/ML IJ SOLN: 0.0000 [IU] | Freq: Three times a day (TID) | INTRAMUSCULAR | Status: DC

## 2024-01-03 NOTE — Plan of Care (Signed)
  Problem: Coping: Goal: Ability to adjust to condition or change in health will improve Outcome: Progressing   Problem: Fluid Volume: Goal: Ability to maintain a balanced intake and output will improve Outcome: Progressing   Problem: Health Behavior/Discharge Planning: Goal: Ability to identify and utilize available resources and services will improve Outcome: Progressing Goal: Ability to manage health-related needs will improve Outcome: Progressing   Problem: Metabolic: Goal: Ability to maintain appropriate glucose levels will improve Outcome: Progressing   Problem: Nutritional: Goal: Maintenance of adequate nutrition will improve Outcome: Progressing Goal: Progress toward achieving an optimal weight will improve Outcome: Progressing   Problem: Skin Integrity: Goal: Risk for impaired skin integrity will decrease Outcome: Progressing   Problem: Tissue Perfusion: Goal: Adequacy of tissue perfusion will improve Outcome: Progressing   Problem: Health Behavior/Discharge Planning: Goal: Ability to manage health-related needs will improve Outcome: Progressing Goal: Goals will be collaboratively established with patient/family Outcome: Progressing   Problem: Self-Care: Goal: Ability to participate in self-care as condition permits will improve Outcome: Progressing   Problem: Nutrition: Goal: Dietary intake will improve Outcome: Progressing

## 2024-01-03 NOTE — Progress Notes (Signed)
 PROGRESS NOTE                                                                                                                                                                                                             Patient Demographics:    Nathan Nicholson, is a 68 y.o. male, DOB - 09/22/1955, FMW:[id number]  Outpatient Primary MD for the patient is Pandora Therisa RAMAN, NP    LOS - 3  Admit date - 12/30/2023    Chief Complaint  Patient presents with   Code Stroke       Brief Narrative (HPI from H&P)      -68 y.o. male with PMH of f COPD, CAD status post PCI in 2023, hypertension, rheumatoid arthritis was brought to the ED for increasing lethargic sleepy and also having slurred speech.at baseline is largely bedbound but able to usually hold on a conversation.  Was found to be lethargic in ED, CT head with no acute finding, CTA head and neck with no LVO, found with evidence of CO2 retention pH of 7.26 pCO2 of 90.  WBC count is 19.9.  Chest x-ray shows possible infiltrates.  Given the hypercarbic respiratory failure patient is placed on BiPAP and started on IV antibiotics for pneumonia with acute encephalopathy and acute on chronic hypercarbic and hypoxic respiratory failure.   Subjective:    Nathan Nicholson denies any complaints, he was compliant with BiPAP overnight    Assessment  & Plan :   Acute respiratory failure with acute hypercarbia and acute on chronic hypoxia, on 2 L nasal cannula oxygen  at home, not on CPAP at night, does have clinical evidence of pneumonia, also uses narcotics at home and likely has undiagnosed OHS and OSA as well.  Metabolic encephalopathy   Does have pneumonia upon admission, did have acute hypercarbic and hypoxic respiratory failure in the ER, likely some element of undiagnosed OSA OHS, currently on nighttime BiPAP, nasal cannula oxygen  and daytime, antibiotics, nebulizer treatments, mentation and  respiratory status much improved.  Continue treating for pneumonia nighttime BiPAP, outpatient sleep study will need to be discharged on BiPAP nighttime.   - Will need BiPAP on discharge  - Diurese as needed . - Pulmonary input greatly appreciated, will need to follow-up with primary pulmonary [place] as an outpatient . - Compliant with BiPAP, has been arranged  Community-acquired pneumonia:   - Continue with azithromycin  and Rocephin , leukocytosis trending down. - Encourage to use incentive spirometry a flutter valve     Acute metabolic encephalopathy:  Due to above, CT and MRI brain unremarkable, case discussed with neurology nothing else to offer.  Stable TSH and ammonia.   Mild acute on chronic diastolic CHF EF 60%.  Trial of Lasix and monitor.    CAD status post PCI in 2023:  Serial troponin negative x 2.  Continue aspirin  per rectally for now.   Carotid artery disease.   Nocturnal bradycardia  Chronic systolic CHF  - Cardiology input greatly appreciated -Hold beta-blockers given nocturnal bradycardia, will need to follow-up with primary cardiologist as an outpatient for Zio patch -Stop aspirin , continue with Plavix -Continue with atorvastatin   Hypertension    Stable, off home beta-blockers currently given nocturnal bradycardia, blood pressure is soft, will hold on any new meds   Macrocytosis:  Monitor hb.   History of rheumatoid arthritis  per the chart.   Obesity.  BMI 36.  Follow-up with PCP.    Diabetes mellitus type 2:  A1c stable, cbg stable.  Continue SSI         Condition - Extremely Guarded  Family Communication  : None at bedside   Code Status :  Full  Consults  :   PCCM, Neuro  PUD Prophylaxis :     Procedures  :     EEG  TTE  1. Left ventricular ejection fraction, by estimation, is 55 to 60%. The left ventricle has normal function. The left ventricle has no regional wall motion abnormalities. Left ventricular diastolic parameters are  consistent with Grade I diastolic dysfunction (impaired relaxation).  2. Right ventricular systolic function is normal. The right ventricular size is normal.  3. The mitral valve is normal in structure. Trivial mitral valve regurgitation. No evidence of mitral stenosis.  4. The aortic valve is normal in structure. Aortic valve regurgitation is not visualized. Aortic valve sclerosis is present, with no evidence of aortic valve stenosis.  5. The inferior vena cava is normal in size with greater than 50% respiratory variability, suggesting right atrial pressure of 3 mmHg.   MRI - 1. Motion degraded exam. 2. No acute intracranial abnormality. 3. Mild chronic microvascular ischemic disease with a few small remote lacunar infarcts about the right basal ganglia and right thalamus  CTA  1. No acute large vessel occlusion. 2. Approximately 40% of the right proximal ICA. 3. No convincing infarct or penumbra      Disposition Plan  :    Status is: Inpatient   DVT Prophylaxis  :  Lovenox     Lab Results  Component Value Date   PLT 229 01/03/2024    Diet :  Diet Order             Diet regular Room service appropriate? Yes with Assist; Fluid consistency: Thin  Diet effective now                    Inpatient Medications  Scheduled Meds:  arformoterol  15 mcg Nebulization BID   atorvastatin   80 mg Oral QHS   azithromycin   500 mg Oral Daily   budesonide  (PULMICORT ) nebulizer solution  0.5 mg Nebulization BID   clopidogrel  75 mg Oral Daily   cyanocobalamin   1,000 mcg Intramuscular Q30 days   enoxaparin  (LOVENOX ) injection  45 mg Subcutaneous Q24H   folic acid   1 mg Oral q morning  furosemide  20 mg Oral Daily   Gerhardt's butt cream   Topical BID   insulin  aspart  0-6 Units Subcutaneous TID WC   revefenacin  175 mcg Nebulization Daily   Continuous Infusions:  cefTRIAXone  (ROCEPHIN )  IV 2 g (01/02/24 2147)   PRN Meds:.acetaminophen  **OR** acetaminophen  (TYLENOL ) oral liquid 160 mg/5  mL **OR** acetaminophen , albuterol , nitroGLYCERIN    Objective:   Vitals:   01/03/24 0338 01/03/24 0731 01/03/24 0921 01/03/24 1120  BP:  127/84  114/70  Pulse: 72 67 97 77  Resp:  17 16 17   Temp: 98.1 F (36.7 C) 97.8 F (36.6 C)  97.8 F (36.6 C)  TempSrc: Oral Oral  Oral  SpO2: 93%     Weight:      Height:        Wt Readings from Last 3 Encounters:  12/30/23 94.6 kg  10/23/23 92.5 kg  08/15/23 92.1 kg     Intake/Output Summary (Last 24 hours) at 01/03/2024 1345 Last data filed at 01/03/2024 1146 Gross per 24 hour  Intake 240 ml  Output 1700 ml  Net -1460 ml     Physical Exam    Awake Alert, Oriented X 3, No new F.N deficits, Normal affect Symmetrical Chest wall movement, Good air movement bilaterally, CTAB RRR,No Gallops,Rubs or new Murmurs, No Parasternal Heave +ve B.Sounds, Abd Soft, No tenderness, No rebound - guarding or rigidity. No Cyanosis, Clubbing or edema, No new Rash or bruise         Data Review:    Recent Labs  Lab 12/30/23 2152 12/31/23 0219 12/31/23 0352 12/31/23 0753 01/01/24 0349 01/02/24 0426 01/03/24 0230  WBC 19.9*  --  20.6*  --  21.2* 17.8* 15.6*  HGB 15.4   < > 15.0 15.3 12.3* 13.4 13.3  HCT 51.1   < > 51.6 45.0 38.6* 41.7 41.4  PLT PLATELET CLUMPS NOTED ON SMEAR, COUNT APPEARS ADEQUATE  --  285  --  214 243 229  MCV 106.7*  --  109.1*  --  100.0 99.0 98.6  MCH 32.2  --  31.7  --  31.9 31.8 31.7  MCHC 30.1  --  29.1*  --  31.9 32.1 32.1  RDW 16.7*  --  16.5*  --  15.9* 16.4* 16.2*  LYMPHSABS 3.2  --   --   --   --  4.8* 4.5*  MONOABS 1.9*  --   --   --   --  1.8* 1.8*  EOSABS 0.1  --   --   --   --  0.1 0.2  BASOSABS 0.1  --   --   --   --  0.1 0.1   < > = values in this interval not displayed.    Recent Labs  Lab 12/30/23 2152 12/30/23 2303 12/31/23 0219 12/31/23 0352 12/31/23 0553 12/31/23 0753 01/01/24 0349 01/01/24 0641 01/02/24 0426 01/03/24 0230  NA 142  --    < > 142  --  144 139  --  139 141  K  4.8  --    < > 4.8  --  4.7 3.7  --  3.5 3.6  CL 96*  --   --  98  --   --  97*  --  93* 94*  CO2 34*  --   --  29  --   --  33*  --  38* 36*  ANIONGAP 12  --   --  15  --   --  9  --  8 11  GLUCOSE 106*  --   --  103*  --   --  111*  --  93 100*  BUN 13  --   --  13  --   --  22  --  22 19  CREATININE 0.98  --   --  0.81  --   --  0.81  --  0.91 0.73  AST 29  --   --  27  --   --   --   --   --   --   ALT 30  --   --  29  --   --   --   --   --   --   ALKPHOS 129*  --   --  127*  --   --   --   --   --   --   BILITOT 0.7  --   --  0.5  --   --   --   --   --   --   ALBUMIN 2.8*  --   --  2.7*  --   --   --   --   --   --   CRP  --   --   --   --   --   --   --  2.8* 1.4* 0.8  PROCALCITON  --   --   --   --   --   --   --  <0.10 0.10 <0.10  INR  --  SPECIMEN CLOTTED  --  1.0  --   --   --   --   --   --   TSH  --   --   --   --  0.441  --   --  0.378  --   --   HGBA1C  --   --   --  6.3*  --   --   --   --   --   --   AMMONIA  --   --   --   --  47*  --   --   --   --   --   BNP  --   --   --   --   --   --   --   --   --  61.7  MG  --   --   --   --   --   --   --   --  1.7 1.7  CALCIUM  9.1  --   --  9.1  --   --  8.5*  --  9.0 8.8*   < > = values in this interval not displayed.      Recent Labs  Lab 12/30/23 2152 12/30/23 2303 12/31/23 0352 12/31/23 0553 01/01/24 0349 01/01/24 0641 01/02/24 0426 01/03/24 0230  CRP  --   --   --   --   --  2.8* 1.4* 0.8  PROCALCITON  --   --   --   --   --  <0.10 0.10 <0.10  INR  --  SPECIMEN CLOTTED 1.0  --   --   --   --   --   TSH  --   --   --  0.441  --  0.378  --   --   HGBA1C  --   --  6.3*  --   --   --   --   --  AMMONIA  --   --   --  62*  --   --   --   --   BNP  --   --   --   --   --   --   --  61.7  MG  --   --   --   --   --   --  1.7 1.7  CALCIUM  9.1  --  9.1  --  8.5*  --  9.0 8.8*   Lab Results  Component Value Date   CHOL 114 12/31/2023   HDL 35 (L) 12/31/2023   LDLCALC 65 12/31/2023   TRIG 72 12/31/2023    CHOLHDL 3.3 12/31/2023    Lab Results  Component Value Date   HGBA1C 6.3 (H) 12/31/2023   Recent Labs    01/01/24 0349 01/01/24 0641  TSH  --  0.378  FREET4 0.75  --     Micro Results Recent Results (from the past 240 hours)  Blood culture (routine x 2)     Status: None (Preliminary result)   Collection Time: 12/30/23 11:03 PM   Specimen: BLOOD RIGHT HAND  Result Value Ref Range Status   Specimen Description BLOOD RIGHT HAND  Final   Special Requests   Final    BOTTLES DRAWN AEROBIC AND ANAEROBIC Blood Culture results may not be optimal due to an inadequate volume of blood received in culture bottles   Culture   Final    NO GROWTH 4 DAYS Performed at [facility], [address], [place], [place] [address]    Report Status PENDING  Incomplete  Blood culture (routine x 2)     Status: None (Preliminary result)   Collection Time: 12/30/23 11:03 PM   Specimen: BLOOD LEFT ARM  Result Value Ref Range Status   Specimen Description BLOOD LEFT ARM  Final   Special Requests   Final    BOTTLES DRAWN AEROBIC AND ANAEROBIC Blood Culture results may not be optimal due to an inadequate volume of blood received in culture bottles   Culture   Final    NO GROWTH 4 DAYS Performed at [facility], [address], [place], [place] [address]    Report Status PENDING  Incomplete  MRSA Next Gen by PCR, Nasal     Status: None   Collection Time: 01/01/24  5:57 AM   Specimen: Nasal Mucosa; Nasal Swab  Result Value Ref Range Status   MRSA by PCR Next Gen NOT DETECTED NOT DETECTED Final    Comment: (NOTE) The GeneXpert MRSA Assay (FDA approved for NASAL specimens only), is one component of a comprehensive MRSA colonization surveillance program. It is not intended to diagnose MRSA infection nor to guide or monitor treatment for MRSA infections. Test performance is not FDA approved in patients less than 41 years old. Performed at [facility], [address] [place],  [place] [address]     Radiology Report EEG adult Result Date: 01/01/2024 Shelton Arlin KIDD, MD     01/01/2024  2:02 PM Patient Name: DERRICK TIEGS MRN: [id number] Epilepsy Attending: Arlin KIDD Shelton Referring Physician/Provider: Dennise Lavada POUR, MD Date: 01/01/2024 Duration: 23.50 mins Patient history: 68yo M with ams. EEG to evaluate for seizure Level of alertness: Awake AEDs during EEG study: None Technical aspects: This EEG study was done with scalp electrodes positioned according to the 10-20 International system of electrode placement. Electrical activity was reviewed with band pass filter of 1-70Hz , sensitivity  of 7 uV/mm, display speed of 51mm/sec with a 60Hz  notched filter applied as appropriate. EEG data were recorded continuously and digitally stored.  Video monitoring was available and reviewed as appropriate. Description: The posterior dominant rhythm consists of 8 Hz activity of moderate voltage (25-35 uV) seen predominantly in posterior head regions, symmetric and reactive to eye opening and eye closing. Hyperventilation and photic stimulation were not performed.   IMPRESSION: This study is within normal limits. No seizures or epileptiform discharges were seen throughout the recording. A normal interictal EEG does not exclude the diagnosis of epilepsy. Priyanka O Yadav     Signature  -   Rilee Knoll M.D on 01/03/2024 at 1:45 PM   -  To page go to [email or web address]

## 2024-01-03 NOTE — Evaluation (Signed)
 RT Evaluate and Treat Note  01/03/2024   Breathing is (select one): Same as normal    The following was found on auscultation (select multiple):  Bilateral Breath Sounds: Diminished;Clear (01/03/24 0924)  R Upper  Breath Sounds: Diminished;Clear (01/03/24 0921) L Upper Breath Sounds: Diminished;Clear (01/03/24 0921) R Lower Breath Sounds: Diminished;Clear (01/03/24 0921) L Lower Breath Sounds: Diminished;Clear (01/03/24 9078)    Cough Assessment: Cough: Congested (01/03/24 0750)    Most Recent Chest Xray:... see results from 10/28    The following medications and/or interventions were ordered/changed/discontinued as part of the Respiratory Treatment protocol:   Medication Changes: none, continue current therapy    Airway Clearance Changes: continue current therapy     Oxygen  Therapy Changes: continue current therapy

## 2024-01-04 ENCOUNTER — Telehealth (HOSPITAL_COMMUNITY): Payer: Self-pay

## 2024-01-04 ENCOUNTER — Other Ambulatory Visit (HOSPITAL_COMMUNITY): Payer: Self-pay

## 2024-01-04 ENCOUNTER — Inpatient Hospital Stay (HOSPITAL_COMMUNITY)

## 2024-01-04 DIAGNOSIS — J9601 Acute respiratory failure with hypoxia: Secondary | ICD-10-CM | POA: Diagnosis not present

## 2024-01-04 DIAGNOSIS — J9602 Acute respiratory failure with hypercapnia: Secondary | ICD-10-CM | POA: Diagnosis not present

## 2024-01-04 LAB — CBC
HCT: 43.5 % (ref 39.0–52.0)
Hemoglobin: 13.9 g/dL (ref 13.0–17.0)
MCH: 31.6 pg (ref 26.0–34.0)
MCHC: 32 g/dL (ref 30.0–36.0)
MCV: 98.9 fL (ref 80.0–100.0)
Platelets: 229 K/uL (ref 150–400)
RBC: 4.4 MIL/uL (ref 4.22–5.81)
RDW: 16.1 % — ABNORMAL HIGH (ref 11.5–15.5)
WBC: 18.1 K/uL — ABNORMAL HIGH (ref 4.0–10.5)
nRBC: 0 % (ref 0.0–0.2)

## 2024-01-04 LAB — CULTURE, BLOOD (ROUTINE X 2)
Culture: NO GROWTH
Culture: NO GROWTH

## 2024-01-04 LAB — GLUCOSE, CAPILLARY: Glucose-Capillary: 113 mg/dL — ABNORMAL HIGH (ref 70–99)

## 2024-01-04 MED ORDER — FUROSEMIDE 20 MG PO TABS
20.0000 mg | ORAL_TABLET | Freq: Every day | ORAL | 0 refills | Status: AC
  Filled 2024-01-04: qty 30, 30d supply, fill #0

## 2024-01-04 MED ORDER — BREZTRI AEROSPHERE 160-9-4.8 MCG/ACT IN AERO
2.0000 | INHALATION_SPRAY | Freq: Two times a day (BID) | RESPIRATORY_TRACT | 2 refills | Status: AC
  Filled 2024-01-04: qty 10.7, 30d supply, fill #0

## 2024-01-04 MED ORDER — AMOXICILLIN-POT CLAVULANATE 875-125 MG PO TABS
1.0000 | ORAL_TABLET | Freq: Two times a day (BID) | ORAL | 0 refills | Status: AC
  Filled 2024-01-04: qty 6, 3d supply, fill #0

## 2024-01-04 MED ORDER — ACETAMINOPHEN 325 MG PO TABS: 650.0000 mg | ORAL_TABLET | ORAL | Status: AC | PRN

## 2024-01-04 MED ORDER — GUAIFENESIN ER 600 MG PO TB12
600.0000 mg | ORAL_TABLET | Freq: Two times a day (BID) | ORAL | 0 refills | Status: AC
  Filled 2024-01-04: qty 30, 15d supply, fill #0

## 2024-01-04 NOTE — Discharge Instructions (Signed)
 Follow with Primary MD Pandora Therisa RAMAN, NP in 7 days   Get CBC, CMP,  checked  by Primary MD next visit.    Activity: As tolerated with Full fall precautions use walker/cane & assistance as needed   Disposition Home    Diet: Heart Healthy    On your next visit with your primary care physician please Get Medicines reviewed and adjusted.   Please request your Prim.MD to go over all Hospital Tests and Procedure/Radiological results at the follow up, please get all Hospital records sent to your Prim MD by signing hospital release before you go home.   If you experience worsening of your admission symptoms, develop shortness of breath, life threatening emergency, suicidal or homicidal thoughts you must seek medical attention immediately by calling 911 or calling your MD immediately  if symptoms less severe.  You Must read complete instructions/literature along with all the possible adverse reactions/side effects for all the Medicines you take and that have been prescribed to you. Take any new Medicines after you have completely understood and accpet all the possible adverse reactions/side effects.   Do not drive, operating heavy machinery, perform activities at heights, swimming or participation in water  activities or provide baby sitting services if your were admitted for syncope or siezures until you have seen by Primary MD or a Neurologist and advised to do so again.  Do not drive when taking Pain medications.    Do not take more than prescribed Pain, Sleep and Anxiety Medications  Special Instructions: If you have smoked or chewed Tobacco  in the last 2 yrs please stop smoking, stop any regular Alcohol  and or any Recreational drug use.  Wear Seat belts while driving.   Please note  You were cared for by a hospitalist during your hospital stay. If you have any questions about your discharge medications or the care you received while you were in the hospital after you are  discharged, you can call the unit and asked to speak with the hospitalist on call if the hospitalist that took care of you is not available. Once you are discharged, your primary care physician will handle any further medical issues. Please note that NO REFILLS for any discharge medications will be authorized once you are discharged, as it is imperative that you return to your primary care physician (or establish a relationship with a primary care physician if you do not have one) for your aftercare needs so that they can reassess your need for medications and monitor your lab values.

## 2024-01-04 NOTE — Care Management Important Message (Signed)
 Important Message  Patient Details  Name: Nathan Nicholson MRN: [id number] Date of Birth: 10/09/1955   Important Message Given:  Yes - Medicare IM     Claretta Deed 01/04/2024, 4:35 PM

## 2024-01-04 NOTE — Telephone Encounter (Signed)
 Pharmacy Patient Advocate Encounter  Insurance verification completed.    The patient is insured through [employer]/ [employer]. Patient has Medicare and is not eligible for a copay card, but may be able to apply for patient assistance or Medicare RX Payment Plan (Patient Must reach out to their plan, if eligible for payment plan), if available.    Ran test claim for Breztri  and the current 30 day co-pay is $0.  Ran test claim for Trelegy [id number] and the current 30 day co-pay is $0.   This test claim was processed through [employer]- copay amounts may vary at other pharmacies due to [employer], or as the patient moves through the different stages of their insurance plan.

## 2024-01-04 NOTE — Progress Notes (Signed)
 Discharge paperwork reviewed with pt, SO, and daughter by this RN.  MD also at the bedside to review DC instructions and meds with the family.  TOC meds picked up.  Pt discharged via WC with Allyson, RN (discharge RN).

## 2024-01-04 NOTE — Plan of Care (Signed)
  Problem: Health Behavior/Discharge Planning: Goal: Ability to manage health-related needs will improve Outcome: Progressing   Problem: Skin Integrity: Goal: Risk for impaired skin integrity will decrease Outcome: Progressing   Problem: Education: Goal: Knowledge of secondary prevention will improve (MUST DOCUMENT ALL) Outcome: Progressing   Problem: Clinical Measurements: Goal: Respiratory complications will improve Outcome: Progressing

## 2024-01-04 NOTE — Progress Notes (Signed)
 Modified Barium Swallow Study  Patient Details  Name: Nathan Nicholson MRN: [id number] Date of Birth: 1955-04-28  Today's Date: 01/04/2024  Modified Barium Swallow completed.  Full report located under Chart Review in the Imaging Section.  History of Present Illness 68 y.o. M presenting initially to the ED on 10/26 for increased lethargy and slurred speech.  CT head without acute findings and MRI no acute intracranial abnormality, mild chronic microvascular ischemic disease with a few small remote lacunar infarcts about the right basal ganglia and right thalamus, Pt with acute respiratory failure with hypercarbia and placed on bipap. Pt with PNA and metabolic encephalopathy. PMH of COPD, CAD s/p PCI, HTN, rheumatoid arthritis. Initially not agreeable to [facility] but after discussion with MD agreed to [facility] prior to discharge.   Clinical Impression Pt demonstrated normal oropharyngeal swallow function with minimal and largely insignificant esophageal observation. Despite endentulous state and typical use of dentures,  (not present) mastication of solid was timely with complete clearance. Laryngeal mobility and efficiency of swallow normal with transient penetration once during challenging sequentlal sips thin considered to be typical (PAS 2). No pharyngeal residue present. Barium pill hesitated at the GE junction with thin barium behind it which fully and spontaneously cleared entering stomach after 10 seconds. Recommend continue regular textures, thin liquids, pills with thin one at a time and slow pace to allow for esophageal clearance. Educated to refrain from po's/take breaks if he feels SOB. Encouraged pt to ambulate as much as possible to facilitate pulmonary function with his COPD. Factors that may increase risk of adverse event in presence of aspiration Noe & Lianne 2021): Respiratory or GI disease;Limited mobility (due to COPD)  Swallow Evaluation Recommendations Recommendations: PO diet PO  Diet Recommendation: Regular;Thin liquids (Level 0) Liquid Administration via: Cup;Straw Medication Administration: Whole meds with liquid Supervision: Patient able to self-feed Swallowing strategies  :  (rest breaks if SOB) Postural changes: Position pt fully upright for meals;Stay upright 30-60 min after meals Oral care recommendations: Oral care BID (2x/day)      Dustin Olam Bull 01/04/2024,2:13 PM

## 2024-01-04 NOTE — Progress Notes (Addendum)
 Dr E. wants to speak to pt's family when they arrive.  This RN spoke with pt's daughter Harlene who states she is on her way.  Discharge instructions reviewed with pt with Discharge RN Allyson.

## 2024-01-04 NOTE — Progress Notes (Signed)
 Printed and reviewed AVS with patient waiting for ride to arrive to review with them also.

## 2024-01-04 NOTE — Discharge Summary (Signed)
 Physician Discharge Summary  Nathan Nicholson FMW:[id number] DOB: 1956-01-30 DOA: 12/30/2023  PCP: Pandora Therisa RAMAN, NP  Admit date: 12/30/2023 Discharge date: 01/04/2024  Admitted From: (Home) Disposition:  (Home )  Recommendations for Outpatient Follow-up:  Follow up with PCP in 1-2 weeks Please obtain BMP/CBC in one week Please keep encouraging to use incentive spirometer and flutter valve will need to follow-up with primary pulmonary Dewald as an outpatient . Patient will need to follow with his primary cardiologist  Home Health: (YES)  Diet recommendation: Heart Healthy / Carb Modified  -68 y.o. male with PMH of f COPD, CAD status post PCI in 2023, hypertension, rheumatoid arthritis was brought to the ED for increasing lethargic sleepy and also having slurred speech.at baseline is largely bedbound but able to usually hold on a conversation.  Was found to be lethargic in ED, CT head with no acute finding, CTA head and neck with no LVO, found with evidence of CO2 retention pH of 7.26 pCO2 of 90.  WBC count is 19.9.  Chest x-ray shows possible infiltrates.  Given the hypercarbic respiratory failure patient is placed on BiPAP and started on IV antibiotics for pneumonia with acute encephalopathy and acute on chronic hypercarbic and hypoxic respiratory failure.   Brief/Interim Summary:  acute hypercarbia failure acute on chronic operatory failure with hypoxia - on 2 L nasal cannula oxygen  at home, not on CPAP at night, does have clinical evidence of pneumonia, also uses narcotics at home and likely has undiagnosed OHS and OSA as well.  Metabolic encephalopathy  -Does have pneumonia upon admission, did have acute hypercarbic and hypoxic respiratory failure in the ER, likely some element of undiagnosed OSA OHS, currently on nighttime BiPAP, nasal cannula oxygen  and daytime, antibiotics, nebulizer treatments, mentation and respiratory status much improved.  Continue treating for pneumonia  nighttime BiPAP, outpatient sleep study will need to be discharged on BiPAP nighttime.   - PAP has been arranged for discharge, delivered to his room - Pulmonary input greatly appreciated, will need to follow-up with primary pulmonary Dewald as an outpatient . - Compliant with BiPAP, has been arranged      Community-acquired pneumonia:   - Treated with IV Rocephin  and azithromycin  during hospital stay, repeat x-ray 10/28 still with significant left lower lobe opacity, patient with copious secretions, noncompliance with chest PT, I have discussed with him at length, as well discussed with family importance of keep using incentive spirometer, flutter valve, and will extend another 3 days on oral Augmentin  on discharge . - SLP input greatly appreciated, went for MBS study today, he is low risk of aspiration.       Acute metabolic encephalopathy:  Due to above, CT and MRI brain unremarkable, case discussed with neurology nothing else to offer.  Stable TSH and ammonia.   acute on chronic diastolic CHF EF 60%.  On IV diuresis initially, he is discharged on low-dose Lasix trial of Lasix and monitor.     CAD status post PC:   Carotid artery disease.   Nocturnal bradycardia  Chronic systolic CHF  Serial troponin negative x 2  - Cardiology input greatly appreciated -Hold beta-blockers given nocturnal bradycardia, will need to follow-up with primary cardiologist as an outpatient for Zio patch -Stop aspirin , continue with Plavix -Continue with atorvastatin    Hypertension    Stable, off home beta-blockers currently given nocturnal bradycardia, blood pressure is soft, will hold on any new meds   Macrocytosis:  Monitor hb.   History of rheumatoid arthritis  per the chart.  Continue with methotrexate   Obesity.  BMI 36.  Follow-up with PCP.     Diabetes mellitus type 2:  A1c stable, cbg stable.  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    Discharge Diagnoses:  Principal Problem:   Acute respiratory failure with hypoxia and hypercarbia (HCC) Active Problems:   COPD (chronic obstructive pulmonary disease) (HCC)   Benign essential HTN   DM2 (diabetes mellitus, type 2) (HCC)   CAD in native artery   History of heart artery stent   Acute encephalopathy   Pneumonia of left lower lobe due to infectious organism   COPD with acute exacerbation [facility])    Discharge Instructions  Discharge Instructions     Diet - low sodium heart healthy   Complete by: As directed    Discharge instructions   Complete by: As directed    Follow with Primary MD Pandora Therisa RAMAN, NP in 7 days   Get CBC, CMP,  checked  by Primary MD next visit.    Activity: As tolerated with Full fall precautions use walker/cane & assistance as needed   Disposition Home    Diet: Heart Healthy    On your next visit with your primary care physician please Get Medicines reviewed and adjusted.   Please request your Prim.MD to go over all Hospital Tests and Procedure/Radiological results at the follow up, please get all Hospital records sent to your Prim MD by signing hospital release before you go home.   If you experience worsening of your admission symptoms, develop shortness of breath, life threatening emergency, suicidal or homicidal thoughts you must seek medical attention immediately by calling 911 or calling your MD immediately  if symptoms less severe.  You Must read complete instructions/literature along with all the possible adverse reactions/side effects for all the Medicines you take and that have been prescribed to you. Take any new Medicines after you have completely understood and accpet all the possible adverse reactions/side effects.   Do not drive, operating heavy machinery, perform activities at heights, swimming or participation in  water  activities or provide baby sitting services if your were admitted for syncope or siezures until you have seen by Primary MD or a Neurologist and advised to do so again.  Do not drive when taking Pain medications.    Do not take more than prescribed Pain, Sleep and Anxiety Medications  Special Instructions: If you have smoked or chewed Tobacco  in the last 2 yrs please stop smoking, stop any regular Alcohol  and or any Recreational drug use.  Wear Seat belts while driving.   Please note  You were cared for by a hospitalist during your hospital stay. If you have any questions about your discharge medications or the care you received while you were in the hospital after you are discharged, you can call the unit and asked to speak with the hospitalist on call if the hospitalist that took care of you is not available. Once you are discharged, your primary care physician will handle any further medical issues. Please note that NO REFILLS for any discharge medications will be authorized once you are discharged, as it is imperative that you return to your primary care physician (or establish a relationship with a primary care physician if you do not have one) for your aftercare needs so that they  can reassess your need for medications and monitor your lab values.   Discharge wound care:   Complete by: As directed    Wound care  Daily      Comments: 1.         Cleanse mid abdominal wound with NS, apply silver hydrofiber (Aquacel ANDRIA Collum [phone]) to wound bed daily and secure with silicone foam or ABD pad and tape whichever is preferred.  2.Cleanse intergluteal cleft/coccyx wound with NS, apply a strip of silver hydrofiber Soila [phone]) to wound bed daily and secure with silicone foam.  01/01/24 0648     01/01/24 0800    Wound care  2 times daily      Comments: Cleanse underneath pannus/abdominal folds with soap and water , dry and apply Interdry AG as follows:  Order Collum # [id number] Measure  and cut length of InterDry to fit in skin folds that have skin breakdown Tuck InterDry fabric into skin folds in a single layer, allow for 2 inches of overhang from skin edges to allow for wicking to occur May remove to bathe; dry area thoroughly and then tuck into affected areas again Do not apply any creams or ointments when using InterDry DO NOT THROW AWAY FOR 5 DAYS unless soiled with stool DO NOT [facility] product, this will inactivate the silver in the material  New sheet of Interdry should be applied after 5 days of use if patient continues to have skin breakdown   Increase activity slowly   Complete by: As directed       Allergies as of 01/04/2024   No Known Allergies      Medication List     STOP taking these medications    aspirin  EC 81 MG tablet   hydrOXYzine 10 MG tablet Commonly known as: ATARAX   nebivolol 10 MG tablet Commonly known as: BYSTOLIC       TAKE these medications    acetaminophen  325 MG tablet Commonly known as: TYLENOL  Take 2 tablets (650 mg total) by mouth every 4 (four) hours as needed for mild pain (pain score 1-3) or fever (or temp > 37.5 C (99.5 F)). What changed:  medication strength how much to take when to take this reasons to take this   albuterol  108 (90 Base) MCG/ACT inhaler Commonly known as: VENTOLIN  HFA Inhale 2 puffs into the lungs every 4 (four) hours as needed for wheezing or shortness of breath (Breathing).   alendronate 70 MG tablet Commonly known as: FOSAMAX Take 70 mg by mouth once a week.   amoxicillin -clavulanate 875-125 MG tablet Commonly known as: AUGMENTIN  Take 1 tablet by mouth 2 (two) times daily for 3 days.   atorvastatin  80 MG tablet Commonly known as: LIPITOR Take 80 mg by mouth at bedtime.   Breztri  Aerosphere 160-9-4.8 MCG/ACT Aero inhaler Generic drug: budesonide -glycopyrrolate -formoterol  Inhale 2 puffs into the lungs in the morning and at bedtime.   CALCIUM  600+D3 PO Take 1 tablet by mouth at  bedtime.   clopidogrel 75 MG tablet Commonly known as: PLAVIX Take 75 mg by mouth daily.   cyanocobalamin  1000 MCG/ML injection Commonly known as: VITAMIN B12 Inject 1,000 mcg into the muscle every 30 (thirty) days.   escitalopram 10 MG tablet Commonly known as: LEXAPRO Take 10 mg by mouth daily.   ferrous sulfate  325 (65 FE) MG tablet Take 325 mg by mouth once a week.   folic acid  1 MG tablet Commonly known as: FOLVITE  Take 1 mg by mouth every morning.  furosemide 20 MG tablet Commonly known as: LASIX Take 1 tablet (20 mg total) by mouth daily. Start taking on: January 05, 2024   guaiFENesin  600 MG 12 hr tablet Commonly known as: Mucinex  Take 1 tablet (600 mg total) by mouth 2 (two) times daily.   ipratropium-albuterol  0.5-2.5 (3) MG/3ML Soln Commonly known as: DUONEB Take 3 mLs by nebulization every 6 (six) hours as needed. What changed: when to take this   Jardiance 10 MG Tabs tablet Generic drug: empagliflozin Take 10 mg by mouth daily.   methotrexate 2.5 MG tablet Commonly known as: RHEUMATREX Take 10 mg by mouth every Tuesday. Caution:Chemotherapy. Protect from light.   nitroGLYCERIN 0.4 MG SL tablet Commonly known as: NITROSTAT Place 0.4 mg under the tongue every 5 (five) minutes as needed for chest pain. Up to 3 doses.   OXYGEN  Inhale 2 L into the lungs continuous.   Vitamin D (Ergocalciferol) 1.25 MG (50000 UNIT) Caps capsule Commonly known as: DRISDOL Take 50,000 Units by mouth every Monday.               Durable Medical Equipment  (From admission, onward)           Start     Ordered   01/02/24 1421  For home use only DME Bipap  Once       Question Answer Comment  Length of Need Lifetime   Bleed in oxygen  (LPM) 2L   Keep O2 saturation between 88- 95   Inspiratory pressure 12   Expiratory pressure 8      01/02/24 1422              Discharge Care Instructions  (From admission, onward)           Start     Ordered    01/04/24 0000  Discharge wound care:       Comments: Wound care  Daily      Comments: 1.         Cleanse mid abdominal wound with NS, apply silver hydrofiber (Aquacel ANDRIA Collum [phone]) to wound bed daily and secure with silicone foam or ABD pad and tape whichever is preferred.  2.Cleanse intergluteal cleft/coccyx wound with NS, apply a strip of silver hydrofiber Soila [phone]) to wound bed daily and secure with silicone foam.  01/01/24 0648     01/01/24 0800    Wound care  2 times daily      Comments: Cleanse underneath pannus/abdominal folds with soap and water , dry and apply Interdry AG as follows:  Order Collum # [id number] Measure and cut length of InterDry to fit in skin folds that have skin breakdown Tuck InterDry fabric into skin folds in a single layer, allow for 2 inches of overhang from skin edges to allow for wicking to occur May remove to bathe; dry area thoroughly and then tuck into affected areas again Do not apply any creams or ointments when using InterDry DO NOT THROW AWAY FOR 5 DAYS unless soiled with stool DO NOT [facility] product, this will inactivate the silver in the material  New sheet of Interdry should be applied after 5 days of use if patient continues to have skin breakdown   01/04/24 1308            Follow-up Information     [facility] Follow up.   Why: For home health services. Contact information: [address] [place] [address] [phone]  No Known Allergies  Consultations: Cardiology  pulmonary/critical care Neurology    Procedures/Studies: EEG adult Result Date: 01/01/2024 Shelton Arlin KIDD, MD     01/01/2024  2:02 PM Patient Name: Nathan Nicholson MRN: [id number] Epilepsy Attending: Arlin KIDD Shelton Referring Physician/Provider: Dennise Lavada POUR, MD Date: 01/01/2024 Duration: 23.50 mins Patient history: 68yo M with ams. EEG to evaluate for seizure Level of alertness: Awake AEDs during EEG  study: None Technical aspects: This EEG study was done with scalp electrodes positioned according to the 10-20 International system of electrode placement. Electrical activity was reviewed with band pass filter of 1-70Hz , sensitivity of 7 uV/mm, display speed of 5mm/sec with a 60Hz  notched filter applied as appropriate. EEG data were recorded continuously and digitally stored.  Video monitoring was available and reviewed as appropriate. Description: The posterior dominant rhythm consists of 8 Hz activity of moderate voltage (25-35 uV) seen predominantly in posterior head regions, symmetric and reactive to eye opening and eye closing. Hyperventilation and photic stimulation were not performed.   IMPRESSION: This study is within normal limits. No seizures or epileptiform discharges were seen throughout the recording. A normal interictal EEG does not exclude the diagnosis of epilepsy. Arlin KIDD Shelton   DG Chest Port 1 View Result Date: 01/01/2024 EXAM: 1 VIEW(S) XRAY OF THE CHEST 01/01/2024 06:48:00 AM COMPARISON: Portable chest 12/30/2023. CLINICAL HISTORY: SOB (shortness of breath); Pt has hx of COPD and htn. FINDINGS: LUNGS AND PLEURA: The lungs are mildly emphysematous. There is decreased inspiration today with increased patchy opacity in the hypoexpanded left base, which could represent asymmetric atelectasis or consolidation. The remaining lungs appear generally clear. No pulmonary edema. No substantial pleural effusion. No pneumothorax. HEART AND MEDIASTINUM: There is mild cardiomegaly. No vascular congestion is seen. The mediastinum is stable. BONES AND SOFT TISSUES: No acute osseous abnormality. IMPRESSION: 1. Increased patchy opacity in the hypoexpanded left base, possibly representing asymmetric atelectasis or consolidation. Electronically signed by: Francis Quam MD 01/01/2024 07:04 AM EDT RP Workstation: [id number]   MR BRAIN WO CONTRAST Result Date: 12/31/2023 EXAM: MRI BRAIN WITHOUT CONTRAST  12/31/2023 10:38:21 PM TECHNIQUE: Multiplanar multisequence MRI of the head/brain was performed without the administration of intravenous contrast. COMPARISON: Comparison with prior CT from 12/30/2023. CLINICAL HISTORY: FINDINGS: LIMITATIONS: Examination severely degraded by motion artifact. BRAIN AND VENTRICLES: No acute infarct. No intracranial hemorrhage. No mass. No midline shift. No hydrocephalus. T2/FLAIR hypertensity involving the periventricular white matter, most likely related to chronic small vessel ischemic disease, mild in nature. Few small remote lacunar infarcts noted about the right basal ganglia and right thalamus. The sella is unremarkable. Normal flow voids. ORBITS: No acute abnormality. SINUSES AND MASTOIDS: No acute abnormality. BONES AND SOFT TISSUES: Normal marrow signal. No acute soft tissue abnormality. Plagiocephaly noted. IMPRESSION: 1. Motion degraded exam. 2. No acute intracranial abnormality. 3. Mild chronic microvascular ischemic disease with a few small remote lacunar infarcts about the right basal ganglia and right thalamus. Electronically signed by: Morene Hoard MD 12/31/2023 11:16 PM EDT RP Workstation: [id number]   ECHOCARDIOGRAM COMPLETE Result Date: 12/31/2023    ECHOCARDIOGRAM REPORT   Patient Name:   MAJESTIC BRISTER Date of Exam: 12/31/2023 Medical Rec #:  [id number]        Height:       63.0 in Accession #:    [id number]       Weight:       208.6 lb Date of Birth:  08-Aug-1955        BSA:  1.968 m Patient Age:    68 years         BP:           110/78 mmHg Patient Gender: M                HR:           83 bpm. Exam Location:  Inpatient Procedure: 2D Echo and Intracardiac Opacification Agent (Both Spectral and Color            Flow Doppler were utilized during procedure). Indications:    Dyspnea  History:        Patient has prior history of Echocardiogram examinations. CHF,                 CAD, COPD; Risk Factors:Hypertension.  Sonographer:    Charmaine Gaskins  Referring Phys: [phone] REDIA LOISE CLEAVER  Sonographer Comments: Technically challenging study due to limited acoustic windows and Technically difficult study due to poor echo windows. Image acquisition challenging due to patient body habitus, Image acquisition challenging due to COPD and Image acquisition challenging due to respiratory motion. IMPRESSIONS  1. Left ventricular ejection fraction, by estimation, is 55 to 60%. The left ventricle has normal function. The left ventricle has no regional wall motion abnormalities. Left ventricular diastolic parameters are consistent with Grade I diastolic dysfunction (impaired relaxation).  2. Right ventricular systolic function is normal. The right ventricular size is normal.  3. The mitral valve is normal in structure. Trivial mitral valve regurgitation. No evidence of mitral stenosis.  4. The aortic valve is normal in structure. Aortic valve regurgitation is not visualized. Aortic valve sclerosis is present, with no evidence of aortic valve stenosis.  5. The inferior vena cava is normal in size with greater than 50% respiratory variability, suggesting right atrial pressure of 3 mmHg. FINDINGS  Left Ventricle: Left ventricular ejection fraction, by estimation, is 55 to 60%. The left ventricle has normal function. The left ventricle has no regional wall motion abnormalities. Definity  contrast agent was given IV to delineate the left ventricular  endocardial borders. The left ventricular internal cavity size was normal in size. There is no left ventricular hypertrophy. Left ventricular diastolic parameters are consistent with Grade I diastolic dysfunction (impaired relaxation). Right Ventricle: The right ventricular size is normal. No increase in right ventricular wall thickness. Right ventricular systolic function is normal. Left Atrium: Left atrial size was normal in size. Right Atrium: Right atrial size was normal in size. Pericardium: There is no evidence of pericardial  effusion. Presence of epicardial fat layer. Mitral Valve: The mitral valve is normal in structure. Trivial mitral valve regurgitation. No evidence of mitral valve stenosis. Tricuspid Valve: The tricuspid valve is normal in structure. Tricuspid valve regurgitation is not demonstrated. No evidence of tricuspid stenosis. Aortic Valve: The aortic valve is normal in structure. Aortic valve regurgitation is not visualized. Aortic valve sclerosis is present, with no evidence of aortic valve stenosis. Pulmonic Valve: The pulmonic valve was normal in structure. Pulmonic valve regurgitation is not visualized. No evidence of pulmonic stenosis. Aorta: The aortic root is normal in size and structure. Venous: The inferior vena cava is normal in size with greater than 50% respiratory variability, suggesting right atrial pressure of 3 mmHg. IAS/Shunts: No atrial level shunt detected by color flow Doppler.  LEFT VENTRICLE PLAX 2D LVIDd:         5.50 cm   Diastology LVIDs:         3.50 cm   LV  e' medial:    7.18 cm/s LV PW:         0.90 cm   LV E/e' medial:  14.2 LV IVS:        1.00 cm   LV e' lateral:   8.38 cm/s LVOT diam:     2.40 cm   LV E/e' lateral: 12.2 LVOT Area:     4.52 cm  RIGHT VENTRICLE RV Basal diam:  3.10 cm RV Mid diam:    1.90 cm RV S prime:     13.70 cm/s LEFT ATRIUM             Index        RIGHT ATRIUM           Index LA diam:        3.60 cm 1.83 cm/m   RA Area:     19.40 cm LA Vol (A2C):   58.7 ml 29.82 ml/m  RA Volume:   55.00 ml  27.94 ml/m LA Vol (A4C):   44.7 ml 22.71 ml/m LA Biplane Vol: 51.0 ml 25.91 ml/m   AORTA Ao Root diam: 3.50 cm Ao Asc diam:  3.20 cm MITRAL VALVE MV Area (PHT): 4.10 cm     SHUNTS MV Decel Time: 185 msec     Systemic Diam: 2.40 cm MV E velocity: 102.00 cm/s MV A velocity: 116.00 cm/s MV E/A ratio:  0.88 Kardie Tobb DO Electronically signed by Dub Huntsman DO Signature Date/Time: 12/31/2023/1:33:16 PM    Final    CT ANGIO HEAD NECK W WO CM W PERF (CODE STROKE) Result Date:  12/30/2023 EXAM: CTA Head and Neck with Perfusion 12/30/2023 10:04:03 PM TECHNIQUE: CTA of the head and neck was performed with the administration of intravenous contrast. 3D postprocessing with multiplanar reconstructions and MIPs was performed to evaluate the vascular anatomy. Cerebral perfusion analysis using computed tomography with contrast administration, including post-processing of parametric maps with determination of cerebral blood flow, cerebral blood volume, mean transit time and time-to-maximum. Automated exposure control, iterative reconstruction, and/or weight based adjustment of the mA/kV was utilized to reduce the radiation dose to as low as reasonably achievable. COMPARISON: None available CLINICAL HISTORY: Neuro deficit, acute, stroke suspected. Code stroke, aphasia and slurred speech; Dr. Michaela FINDINGS: AORTIC ARCH AND ARCH VESSELS: No dissection or arterial injury. No significant stenosis of the brachiocephalic or subclavian arteries. CERVICAL CAROTID ARTERIES: Approximately 40% of the right ICA proximally. CERVICAL VERTEBRAL ARTERIES: No dissection, arterial injury, or significant stenosis. LUNGS AND MEDIASTINUM: Unremarkable. SOFT TISSUES: No acute abnormality. BONES: No acute abnormality. ANTERIOR CIRCULATION: No significant stenosis of the internal carotid arteries. No significant stenosis of the anterior cerebral arteries. No significant stenosis of the middle cerebral arteries. No aneurysm. POSTERIOR CIRCULATION: No significant stenosis of the posterior cerebral arteries. No significant stenosis of the basilar artery. No significant stenosis of the vertebral arteries. No aneurysm. OTHER: No dural venous sinus thrombosis on this non-dedicated study. EXAM QUALITY: Exam quality is adequate with diagnostic perfusion maps. No significant motion artifact. Appropriate arterial inflow and venous outflow curves. CORE INFARCT (CBF<30% volume): 0 mL TOTAL HYPOPERFUSION (Tmax>6s volume): 10 mL  Mismatch volume: 10  mL Location: High left partietal convexity and left temporal bone. IMPRESSION: 1. No acute large vessel occlusion. 2. Approximately 40% of the right proximal ICA. 3. No convincing infarct or penumbra. Reported mismatch is favored artifactual. MRI could better assess for acute infarct if clinically warranted. Electronically signed by: Gilmore Molt MD 12/30/2023 10:32 PM EDT RP Workstation: [id number]  DG Chest Portable 1 View Result Date: 12/30/2023 EXAM: 1 VIEW(S) XRAY OF THE CHEST 12/30/2023 10:24:00 PM COMPARISON: Chest x-ray 07/10/2023. CLINICAL HISTORY: ams workup. ams FINDINGS: LUNGS AND PLEURA: Increased central interstitial markings bilaterally. Increased interstitial opacities in the left lung base. No pulmonary edema. No pleural effusion. No pneumothorax. HEART AND MEDIASTINUM: No acute abnormality of the cardiac and mediastinal silhouettes. BONES AND SOFT TISSUES: No acute osseous abnormality. IMPRESSION: 1. Increased central interstitial markings bilaterally and increased interstitial opacities in the left lung base. Findings may be related to mild edema or an infectious/inflammatory process. Electronically signed by: Greig Pique MD 12/30/2023 10:29 PM EDT RP Workstation: [id number]   CT HEAD CODE STROKE WO CONTRAST Result Date: 12/30/2023 EXAM: CT HEAD WITHOUT 12/30/2023 09:50:34 PM TECHNIQUE: CT of the head was performed without the administration of intravenous contrast. Automated exposure control, iterative reconstruction, and/or weight based adjustment of the mA/kV was utilized to reduce the radiation dose to as low as reasonably achievable. COMPARISON: None available. CLINICAL HISTORY: Neuro deficit, acute, stroke suspected. Dysarthria FINDINGS: BRAIN AND VENTRICLES: No acute intracranial hemorrhage. No mass effect or midline shift. No extra-axial fluid collection. No evidence of acute infarct. No hydrocephalus. White matter changes, compatible with chronic  microvascular ischemic change. Motion limited study. ORBITS: No acute abnormality. SINUSES AND MASTOIDS: No acute abnormality. SOFT TISSUES AND SKULL: No acute skull fracture. No acute soft tissue abnormality. Other: Findings conveyed to Dr. Michaela via pager at 10:04 PM. . IMPRESSION: 1. Motion-limited study without evidence of acute intracranial abnormality. ASPECTS 10. Electronically signed by: Gilmore Molt MD 12/30/2023 10:05 PM EDT RP Workstation: [id number]     Subjective: Tolerating his BiPAP overnight, had MBS study earlier today when he did well, he denies any complaints, eager to go home  Discharge Exam: Vitals:   01/04/24 0800 01/04/24 1208  BP:  120/70  Pulse: 66 69  Resp:    Temp:  98.3 F (36.8 C)  SpO2:     Vitals:   01/04/24 0353 01/04/24 0546 01/04/24 0800 01/04/24 1208  BP: 97/68   120/70  Pulse: 80  66 69  Resp: 18 18    Temp: 98.2 F (36.8 C)   98.3 F (36.8 C)  TempSrc: Axillary   Oral  SpO2: 92% 93%    Weight:      Height:        General: Pt is alert, awake, not in acute distress Cardiovascular: RRR, S1/S2 +, no rubs, no gallops Respiratory: Good air entry, has rhonchi at the bases Abdominal: Soft, NT, ND, bowel sounds + Extremities: no edema, no cyanosis    The results of significant diagnostics from this hospitalization (including imaging, microbiology, ancillary and laboratory) are listed below for reference.     Microbiology: Recent Results (from the past 240 hours)  Blood culture (routine x 2)     Status: None   Collection Time: 12/30/23 11:03 PM   Specimen: BLOOD RIGHT HAND  Result Value Ref Range Status   Specimen Description BLOOD RIGHT HAND  Final   Special Requests   Final    BOTTLES DRAWN AEROBIC AND ANAEROBIC Blood Culture results may not be optimal due to an inadequate volume of blood received in culture bottles   Culture   Final    NO GROWTH 5 DAYS Performed at [facility], [address], [place], [place]  [address]    Report Status 01/04/2024 FINAL  Final  Blood culture (routine x 2)     Status: None  Collection Time: 12/30/23 11:03 PM   Specimen: BLOOD LEFT ARM  Result Value Ref Range Status   Specimen Description BLOOD LEFT ARM  Final   Special Requests   Final    BOTTLES DRAWN AEROBIC AND ANAEROBIC Blood Culture results may not be optimal due to an inadequate volume of blood received in culture bottles   Culture   Final    NO GROWTH 5 DAYS Performed at [facility], [address] [place], [place] [address]    Report Status 01/04/2024 FINAL  Final  MRSA Next Gen by PCR, Nasal     Status: None   Collection Time: 01/01/24  5:57 AM   Specimen: Nasal Mucosa; Nasal Swab  Result Value Ref Range Status   MRSA by PCR Next Gen NOT DETECTED NOT DETECTED Final    Comment: (NOTE) The GeneXpert MRSA Assay (FDA approved for NASAL specimens only), is one component of a comprehensive MRSA colonization surveillance program. It is not intended to diagnose MRSA infection nor to guide or monitor treatment for MRSA infections. Test performance is not FDA approved in patients less than 26 years old. Performed at [facility], [address], [place], [place] [address]      Labs: BNP (last 3 results) Recent Labs    01/03/24 0230  BNP 61.7   Basic Metabolic Panel: Recent Labs  Lab 12/30/23 2152 12/31/23 0219 12/31/23 0352 12/31/23 0753 01/01/24 0349 01/02/24 0426 01/03/24 0230  NA 142   < > 142 144 139 139 141  K 4.8   < > 4.8 4.7 3.7 3.5 3.6  CL 96*  --  98  --  97* 93* 94*  CO2 34*  --  29  --  33* 38* 36*  GLUCOSE 106*  --  103*  --  111* 93 100*  BUN 13  --  13  --  22 22 19   CREATININE 0.98  --  0.81  --  0.81 0.91 0.73  CALCIUM  9.1  --  9.1  --  8.5* 9.0 8.8*  MG  --   --   --   --   --  1.7 1.7   < > = values in this interval not displayed.   Liver Function Tests: Recent Labs  Lab 12/30/23 2152 12/31/23 0352  AST 29 27  ALT 30 29  ALKPHOS 129* 127*   BILITOT 0.7 0.5  PROT 7.1 7.1  ALBUMIN 2.8* 2.7*   No results for input(s): LIPASE, AMYLASE in the last 168 hours. Recent Labs  Lab 12/31/23 0553  AMMONIA 47*   CBC: Recent Labs  Lab 12/30/23 2152 12/31/23 0219 12/31/23 0352 12/31/23 0753 01/01/24 0349 01/02/24 0426 01/03/24 0230 01/04/24 0343  WBC 19.9*  --  20.6*  --  21.2* 17.8* 15.6* 18.1*  NEUTROABS 14.1*  --   --   --   --  10.9* 8.9*  --   HGB 15.4   < > 15.0 15.3 12.3* 13.4 13.3 13.9  HCT 51.1   < > 51.6 45.0 38.6* 41.7 41.4 43.5  MCV 106.7*  --  109.1*  --  100.0 99.0 98.6 98.9  PLT PLATELET CLUMPS NOTED ON SMEAR, COUNT APPEARS ADEQUATE  --  285  --  214 243 229 229   < > = values in this interval not displayed.   Cardiac Enzymes: No results for input(s): CKTOTAL, CKMB, CKMBINDEX, TROPONINI in the last 168 hours. BNP: Invalid input(s): POCBNP CBG: Recent Labs  Lab 01/03/24 0730  01/03/24 1120 01/03/24 1603 01/03/24 1946 01/04/24 0720  GLUCAP 97 124* 91 191* 113*   D-Dimer No results for input(s): DDIMER in the last 72 hours. Hgb A1c No results for input(s): HGBA1C in the last 72 hours. Lipid Profile No results for input(s): CHOL, HDL, LDLCALC, TRIG, CHOLHDL, LDLDIRECT in the last 72 hours. Thyroid  function studies No results for input(s): TSH, T4TOTAL, T3FREE, THYROIDAB in the last 72 hours.  Invalid input(s): FREET3 Anemia work up No results for input(s): VITAMINB12, FOLATE, FERRITIN, TIBC, IRON, RETICCTPCT in the last 72 hours. Urinalysis    Component Value Date/Time   COLORURINE YELLOW 12/30/2023 2303   APPEARANCEUR CLEAR 12/30/2023 2303   LABSPEC 1.020 12/30/2023 2303   PHURINE 5.0 12/30/2023 2303   GLUCOSEU >=500 (A) 12/30/2023 2303   HGBUR NEGATIVE 12/30/2023 2303   BILIRUBINUR NEGATIVE 12/30/2023 2303   KETONESUR NEGATIVE 12/30/2023 2303   PROTEINUR NEGATIVE 12/30/2023 2303   NITRITE POSITIVE (A) 12/30/2023 2303   LEUKOCYTESUR  NEGATIVE 12/30/2023 2303   Sepsis Labs Recent Labs  Lab 01/01/24 0349 01/02/24 0426 01/03/24 0230 01/04/24 0343  WBC 21.2* 17.8* 15.6* 18.1*   Microbiology Recent Results (from the past 240 hours)  Blood culture (routine x 2)     Status: None   Collection Time: 12/30/23 11:03 PM   Specimen: BLOOD RIGHT HAND  Result Value Ref Range Status   Specimen Description BLOOD RIGHT HAND  Final   Special Requests   Final    BOTTLES DRAWN AEROBIC AND ANAEROBIC Blood Culture results may not be optimal due to an inadequate volume of blood received in culture bottles   Culture   Final    NO GROWTH 5 DAYS Performed at [facility], [address] [place], [place] [address]    Report Status 01/04/2024 FINAL  Final  Blood culture (routine x 2)     Status: None   Collection Time: 12/30/23 11:03 PM   Specimen: BLOOD LEFT ARM  Result Value Ref Range Status   Specimen Description BLOOD LEFT ARM  Final   Special Requests   Final    BOTTLES DRAWN AEROBIC AND ANAEROBIC Blood Culture results may not be optimal due to an inadequate volume of blood received in culture bottles   Culture   Final    NO GROWTH 5 DAYS Performed at [facility], [address], [place], [place] [address]    Report Status 01/04/2024 FINAL  Final  MRSA Next Gen by PCR, Nasal     Status: None   Collection Time: 01/01/24  5:57 AM   Specimen: Nasal Mucosa; Nasal Swab  Result Value Ref Range Status   MRSA by PCR Next Gen NOT DETECTED NOT DETECTED Final    Comment: (NOTE) The GeneXpert MRSA Assay (FDA approved for NASAL specimens only), is one component of a comprehensive MRSA colonization surveillance program. It is not intended to diagnose MRSA infection nor to guide or monitor treatment for MRSA infections. Test performance is not FDA approved in patients less than 89 years old. Performed at [facility], [address], [place], [place] [address]      Time coordinating discharge: Over 30  minutes  SIGNED:   Brayton Lye, MD  [facility] 01/04/2024, 1:08 PM Pager   If 7PM-7AM, please contact night-coverage [email or web address] Password [email or web address]

## 2024-01-04 NOTE — TOC Transition Note (Signed)
 Transition of Care [facility]) - Discharge Note   Patient Details  Name: Nathan Nicholson MRN: [id number] Date of Birth: [birth year]  [facility] CM/SW Contact:  Marval Gell, RN Phone Number: 01/04/2024, 2:42 PM   Clinical Narrative:      Notified [facility] that patient will DC today.  No further needs identified   Final next level of care: Home w Home Health Services Barriers to Discharge: No Barriers Identified   Patient Goals and CMS Choice Patient states their goals for this hospitalization and ongoing recovery are:: to go home CMS Medicare.gov Compare Post Acute Care list provided to:: Patient Choice offered to / list presented to : Patient, Spouse      Discharge Placement                       Discharge Plan and Services Additional resources added to the After Visit Summary for     Discharge Planning Services: CM Consult            DME Arranged: NIV, Bipap DME Agency: [facility] Date DME Agency Contacted: 01/01/24 Time DME Agency Contacted: [phone] Representative spoke with at DME Agency: Ethan [phone] HH Arranged: PT, OT, RN [facility] Agency: [facility] Date [facility] Contacted: 01/04/24 Time HH Agency Contacted: [phone] Representative spoke with at [facility] Agency: Amy  Social Drivers of Health (SDOH) Interventions SDOH Screenings   Food Insecurity: No Food Insecurity (12/31/2023)  Housing: Low Risk  (12/31/2023)  Transportation Needs: No Transportation Needs (12/31/2023)  Utilities: Not At Risk (12/31/2023)  Alcohol Screen: Low Risk  (08/10/2022)  Depression (PHQ2-9): Low Risk  (08/10/2022)  Financial Resource Strain: Low Risk  (08/10/2022)  Physical Activity: Insufficiently Active (08/10/2022)  Social Connections: Socially Isolated (12/31/2023)  Stress: No Stress Concern Present (07/27/2021)  Tobacco Use: High Risk (12/30/2023)     Readmission Risk Interventions     No data to display

## 2024-01-04 NOTE — Progress Notes (Signed)
 DISCHARGE NOTE HOME Nathan Nicholson to be discharged Home per MD order. Discussed prescriptions and follow up appointments with the patient. Prescriptions given to patient; medication list explained in detail. Patient verbalized understanding.  Skin clean, dry and intact without evidence of skin break down, no evidence of skin tears noted. IV catheter discontinued intact. Site without signs and symptoms of complications. Dressing and pressure applied. Pt denies pain at the site currently. No complaints noted.  See LDA for Wounds at discharge Patient free of lines, drains, and wounds.   An After Visit Summary (AVS) was printed and given to the patient. Patient escorted via wheelchair, and discharged home via private auto.  Peyton SHAUNNA Pepper, RN

## 2024-01-09 DIAGNOSIS — J9601 Acute respiratory failure with hypoxia: Secondary | ICD-10-CM | POA: Diagnosis not present

## 2024-01-09 DIAGNOSIS — J449 Chronic obstructive pulmonary disease, unspecified: Secondary | ICD-10-CM | POA: Diagnosis not present

## 2024-01-09 DIAGNOSIS — J441 Chronic obstructive pulmonary disease with (acute) exacerbation: Secondary | ICD-10-CM | POA: Diagnosis not present

## 2024-01-21 DIAGNOSIS — J449 Chronic obstructive pulmonary disease, unspecified: Secondary | ICD-10-CM | POA: Diagnosis not present

## 2024-02-04 ENCOUNTER — Telehealth: Payer: Self-pay

## 2024-02-04 NOTE — Telephone Encounter (Signed)
 CMN received from [employer] regarding pt's OSA. This is requiring a provider signature and a copy of pt's compliance and lov. I will fax once signed.

## 2024-02-08 DIAGNOSIS — J441 Chronic obstructive pulmonary disease with (acute) exacerbation: Secondary | ICD-10-CM | POA: Diagnosis not present

## 2024-02-08 DIAGNOSIS — J9601 Acute respiratory failure with hypoxia: Secondary | ICD-10-CM | POA: Diagnosis not present

## 2024-02-25 ENCOUNTER — Ambulatory Visit: Admitting: Pulmonary Disease

## 2024-03-27 ENCOUNTER — Other Ambulatory Visit: Payer: Self-pay | Admitting: Primary Care

## 2024-04-01 ENCOUNTER — Ambulatory Visit: Admitting: Pulmonary Disease

## 2024-06-04 ENCOUNTER — Ambulatory Visit: Admitting: Pulmonary Disease
